# Patient Record
Sex: Male | Born: 1935
Health system: Southern US, Community
[De-identification: ages and names within clinical notes are randomized; demographics above are authoritative.]

## PROBLEM LIST (undated history)

## (undated) DIAGNOSIS — B351 Tinea unguium: Secondary | ICD-10-CM

## (undated) DIAGNOSIS — C801 Malignant (primary) neoplasm, unspecified: Secondary | ICD-10-CM

## (undated) DIAGNOSIS — Z9889 Other specified postprocedural states: Secondary | ICD-10-CM

## (undated) DIAGNOSIS — F419 Anxiety disorder, unspecified: Secondary | ICD-10-CM

## (undated) DIAGNOSIS — N189 Chronic kidney disease, unspecified: Secondary | ICD-10-CM

## (undated) DIAGNOSIS — Z8719 Personal history of other diseases of the digestive system: Secondary | ICD-10-CM

## (undated) DIAGNOSIS — E785 Hyperlipidemia, unspecified: Secondary | ICD-10-CM

## (undated) DIAGNOSIS — R51 Headache: Secondary | ICD-10-CM

## (undated) DIAGNOSIS — R0602 Shortness of breath: Secondary | ICD-10-CM

## (undated) DIAGNOSIS — M2550 Pain in unspecified joint: Secondary | ICD-10-CM

## (undated) DIAGNOSIS — I10 Essential (primary) hypertension: Secondary | ICD-10-CM

## (undated) DIAGNOSIS — N400 Enlarged prostate without lower urinary tract symptoms: Secondary | ICD-10-CM

## (undated) DIAGNOSIS — K5909 Other constipation: Secondary | ICD-10-CM

## (undated) DIAGNOSIS — G4700 Insomnia, unspecified: Secondary | ICD-10-CM

## (undated) DIAGNOSIS — Z8739 Personal history of other diseases of the musculoskeletal system and connective tissue: Secondary | ICD-10-CM

## (undated) DIAGNOSIS — F329 Major depressive disorder, single episode, unspecified: Secondary | ICD-10-CM

## (undated) DIAGNOSIS — I2510 Atherosclerotic heart disease of native coronary artery without angina pectoris: Secondary | ICD-10-CM

## (undated) DIAGNOSIS — R112 Nausea with vomiting, unspecified: Secondary | ICD-10-CM

## (undated) DIAGNOSIS — F32A Depression, unspecified: Secondary | ICD-10-CM

## (undated) DIAGNOSIS — M21619 Bunion of unspecified foot: Secondary | ICD-10-CM

## (undated) DIAGNOSIS — K219 Gastro-esophageal reflux disease without esophagitis: Secondary | ICD-10-CM

## (undated) DIAGNOSIS — R42 Dizziness and giddiness: Secondary | ICD-10-CM

## (undated) HISTORY — PX: EYE SURGERY: SHX253

## (undated) HISTORY — PX: CHOLECYSTECTOMY: SHX55

## (undated) HISTORY — PX: CORONARY ANGIOPLASTY: SHX604

## (undated) HISTORY — DX: Chronic kidney disease, unspecified: N18.9

## (undated) HISTORY — PX: HEMORRHOID SURGERY: SHX153

## (undated) HISTORY — PX: COLONOSCOPY: SHX174

## (undated) HISTORY — PX: KIDNEY STONE SURGERY: SHX686

## (undated) HISTORY — DX: Malignant (primary) neoplasm, unspecified: C80.1

## (undated) HISTORY — DX: Bunion of unspecified foot: M21.619

## (undated) HISTORY — DX: Tinea unguium: B35.1

## (undated) HISTORY — PX: OTHER SURGICAL HISTORY: SHX169

## (undated) HISTORY — PX: HERNIA REPAIR: SHX51

---

## 1997-10-10 ENCOUNTER — Ambulatory Visit (HOSPITAL_COMMUNITY): Admission: RE | Admit: 1997-10-10 | Discharge: 1997-10-10 | Payer: Self-pay | Admitting: General Surgery

## 1998-04-03 ENCOUNTER — Other Ambulatory Visit: Admission: RE | Admit: 1998-04-03 | Discharge: 1998-04-03 | Payer: Self-pay | Admitting: Orthopedic Surgery

## 2001-06-29 ENCOUNTER — Encounter: Admission: RE | Admit: 2001-06-29 | Discharge: 2001-09-27 | Payer: Self-pay | Admitting: *Deleted

## 2007-08-25 ENCOUNTER — Inpatient Hospital Stay (HOSPITAL_COMMUNITY): Admission: AD | Admit: 2007-08-25 | Discharge: 2007-08-26 | Payer: Self-pay | Admitting: Cardiology

## 2007-09-15 ENCOUNTER — Encounter (HOSPITAL_COMMUNITY): Admission: RE | Admit: 2007-09-15 | Discharge: 2007-12-16 | Payer: Self-pay | Admitting: Cardiology

## 2008-02-23 ENCOUNTER — Observation Stay (HOSPITAL_COMMUNITY): Admission: EM | Admit: 2008-02-23 | Discharge: 2008-02-24 | Payer: Self-pay | Admitting: Emergency Medicine

## 2008-07-26 ENCOUNTER — Ambulatory Visit: Payer: Self-pay | Admitting: Cardiology

## 2008-09-12 ENCOUNTER — Encounter: Admission: RE | Admit: 2008-09-12 | Discharge: 2008-09-12 | Payer: Self-pay | Admitting: Family Medicine

## 2010-02-17 ENCOUNTER — Ambulatory Visit (HOSPITAL_COMMUNITY)
Admission: RE | Admit: 2010-02-17 | Discharge: 2010-02-17 | Payer: Self-pay | Source: Home / Self Care | Attending: Psychiatry | Admitting: Psychiatry

## 2010-07-15 NOTE — Discharge Summary (Signed)
[name], [name]            ACCOUNT NO.:  [id number]   MEDICAL RECORD NO.:  [id number]          PATIENT TYPE:  INP   LOCATION:  6525                         FACILITY:  [facility]   PHYSICIAN:  Francisca December, M.D.  DATE OF BIRTH:  1930-03-08   DATE OF ADMISSION:  08/24/2007  DATE OF DISCHARGE:  08/26/2007                               DISCHARGE SUMMARY   DISCHARGE DIAGNOSES:  1. Coronary artery disease status post drug-eluting stent to the left      anterior descending coronary artery, angioplasty to the first      diagonal.  2. Hypertension.  3. Hyperlipidemia.  4. Platinum study participant.  5. Diet-controlled diabetes mellitus.  6. Long-term medication use.   Cody Thomas is a 77 year old male patient, who was admitted to the  [facility] on August 24, 2007, with unstable angina.  His troponin was  elevated to 0.60 consistent with a non-ST-segment elevated myocardial  infarction.  He was taken to the cardiac cath lab on August 24, 2007, and  was found to have the following:  Distal left main 20%, proximal 80% LAD  lesion followed by 30% proximal LAD lesion.  There was a discrete 95%  proximal lesion in the marginal vessel moderate 40%proximal RCA lesion.  At this point, we considered 2-vessel angioplasty versus coronary artery  bypass grafting.   Finally, a decision was made to utilize percutaneous intervention.  The  patient was brought back to the catheterization lab and a drug-eluting  stent was placed to the LAD.  Angioplasty was then using a Platinum  Study stent to the OM vessel.  The patient remained in the hospital  overnight and was ready for discharge to home the following day.   Other lab studies include a sodium of 137, potassium 3.7, BUN 13, and  creatinine 1.35.  White count 6.9, hemoglobin 15.3, hematocrit 43.5,  platelets 135, and hemoglobin A1c 6.7.  Urinalysis negative.  Total  cholesterol 156.  Apparently, the patient had developed a fever of 101.1  during the hospitalization.  Blood cultures were negative.  Please see  chart for further specifics.   The patient was discharged to home on these medications:  1. Cozaar 50 mg twice a day.  2. Enteric-coated aspirin 325 mg a day.  3. Plavix 75 mg a day.  4. Lasix as prior to admission.  5. Fish oil daily.  6. Sublingual nitroglycerin p.r.n. chest pain.   The patient is to remain on low-sodium heart-healthy diet.  Clean cath  site gently with soap and water, no scrubbing.  No lifting over 10  pounds for 1 week.  No driving for 2 days.  Follow up with Dr.  Deitra Mayo nurse practitioner on December 07, 2007, at 09:30 a.m.      Guy Franco, P.A.      Francisca December, M.D.  Electronically Signed    LB/MEDQ  D:  11/09/2007  T:  11/10/2007  Job:  [id number]

## 2010-07-15 NOTE — Discharge Summary (Signed)
NAMEZAVIAR, MOSCO NO.:  [id number]   MEDICAL RECORD NO.:  [id number]          PATIENT TYPE:  INP   LOCATION:  3711                         FACILITY:  [facility]   PHYSICIAN:  Ramiro Harvest, MD    DATE OF BIRTH:  08-07-1930   DATE OF ADMISSION:  02/23/2008  DATE OF DISCHARGE:  02/24/2008                               DISCHARGE SUMMARY   The patient's primary care physician is Dr. Juluis Rainier of [facility].   The patient's cardiologist Dr. Amil Amen of [facility].   DISCHARGE DIAGNOSES:  1. Syncope, multifactorial, likely secondary to micturition syncope      with volume depletion secondary to diuretic use.  2. Orthostasis.  3. Coronary artery disease status post percutaneous coronary      intervention of a drug-eluting stent to the left anterior      descending artery and angioplasty to the first diagonal done in      June 2009.  4. Hyperlipidemia.  5. Hypertension.  6. Diet controlled type 2 diabetes.  7. Status post cholecystectomy.  8. Chronic constipation.  9. Chronic nausea.  10.History of right rotator tear cuff in 1997.  11.Status post bilateral cataract surgery.  12.Status post bilateral inguinal hernia repair.   DISCHARGE MEDICATIONS:  1. Losartan 100 mg p.o. daily to start on Monday, February 27, 2008.  2. Aspirin 325 mg p.o. daily.  3. Plavix 75 mg p.o. daily.  4. Welchol 325 mg 3 tablets p.o. b.i.d.  5. Xanax 0.25 mg p.o. q.h.s. p.r.n.  6. Fish oil 4 tablets p.o. daily.   DISPOSITION AND FOLLOW-UP:  The patient will be discharged home.  The  patient is to follow up with his PCP in 1 week. On follow-up the patient  will need a basic metabolic profile done to follow up on his  electrolytes.  The patient also will need orthostatics checked.  The  patient will need his blood pressure reassessed as the patient's Hyzaar  was discontinued and the patient was just placed on losartan secondary  to concerns of volume depletion  secondary to the diuretic in the Hyzaar.  May consider increasing losartan if needed or adding another medication  to his regimen.  The patient is also to keep follow-up appointment with  his cardiologist, Dr. Amil Amen. If the patient were to have unexplained  episodes of further syncope, may benefit from a loop recorder.   CONSULTATIONS DONE:  None.   PROCEDURES PERFORMED:  1. A chest x-ray was done February 23, 2008 that showed no acute      cardiopulmonary process. Apparent widening of superior mediastinum      is likely related to dextroscoliosis at this level. Bilateral      shallow oblique frontal chest x-rays could be considered for      further evaluation.  CT of the head without contrast was done on      February 23, 2008 that showed age-related atrophy without focal or      acute finding, atherosclerotic calcification of the major vessels      at the base of the grain.   BRIEF ADMISSION HISTORY  AND PHYSICAL:  Mr. Kamarius Meinecke is a 77-year-  old white gentleman with a history of coronary artery disease status  post drug-eluting stent to the LAD and angioplasty to the first  diagonal, history of hypertension, hyperlipidemia, type 2 diabetes diet  controlled, who presented to the ED with a syncopal episode.  The  patient stated when he woke up on the morning of admission at 5:30 a.m.  to go to the bathroom, he started to urinate and once he finished  urinating he felt dizzy, lightheaded and passed out for approximately 15-  20 seconds.  The patient stated that his wife heard him fall and came  and got him.  Associated symptoms include diaphoresis, chronic nausea  which was unchanged, chronic constipation which was unchanged.  The  patient denied any chest pain, no fevers or chills, no emesis, no  palpitations, no abdominal pain, no melena or hematemesis, no  hematochezia.  No focal neurological symptoms.  The patient stated  called his cardiologist's office and was told the  cardiologist was not  in and then referred to his PCP.  The patient went to PCP's office who  checked an EKG which showed a first-degree AV block. She checked some  labs and sent the patient to the ED. In the ED the patient had a  comprehensive metabolic profile done with a glucose of 175. Otherwise  the rest of the plan was within normal limits.  CBC with a white count  of 6.8, hemoglobin 16, platelets 145.  Point of care cardiac markers  were negative.  EKG showed normal sinus rhythm, first-degree AV block,  unchanged from prior EKGs.  The patient stated at that time right after  his syncopal episode he checked his CBG which was 143. His blood  pressure was 120/73. We were called to admit the patient for further  evaluation and management.   PHYSICAL EXAM:  VITAL SIGNS:  Temperature 98.8, blood pressure 138/82,  pulse of 75, respiratory rate 18, satting 98% on room air.  Orthostatics  lying blood pressure 149/81 with a pulse of 73, sitting 142/88 with a  pulse of 87. Standing 133/85 with a pulse of 84.  GENERAL: The patient is in no apparent distress.  HEENT: Normocephalic, atraumatic.  Pupils equal, round and reactive to  light and accommodation.  Extraocular movements intact.  Oropharynx is  clear.  No lesions or exudates.  NECK: Supple.  No lymphadenopathy.  Mildly dry mucous membranes.  RESPIRATORY:  Lungs are clear to auscultation bilaterally.  No wheezes,  no crackles, no rhonchi.  CARDIOVASCULAR: Regular rate and rhythm.  No  murmurs, rubs or gallops.  ABDOMEN: Soft, nontender, nondistended, positive bowel sounds.  EXTREMITIES:  No clubbing, cyanosis or edema.  NEUROLOGICAL:  The patient is alert and oriented x3.  Cranial nerves II-  XII are grossly intact.  No focal deficits.   ADMISSION LABS:  Sodium 139, potassium 4, chloride 105, bicarb 25, BUN  19, creatinine 1.29, glucose of 175, bilirubin 1.8, alk phosphatase 49,  AST 29, ALT 23, protein 7.1, calcium 9.7, albumin 4.2.   Point of care  cardiac markers CK-MB 2.1, troponin-I less than 0.05, myoglobin of 135.  CBC white count 6.8, hemoglobin 16, platelets 145, hematocrit 47.7, ANC  of 5.3.  EKG normal sinus rhythm, first-degree AV block.  CT of the head  as stated above.  Chest x-ray as stated above.   HOSPITAL COURSE:  1. Syncope.  The patient was admitted with syncopal episode. It  was      felt the patient's syncope was likely secondary to a micturition      syncope as well as stasis in the setting of diuretic use.  Cardiac      enzymes were cycled which came back negative x3.  The patient was      placed on the tele floor with uneventful telemetry. Overnight the      patient was asymptomatic.  A TSH was checked which was within      normal limits.  A UDS was checked which was negative. A UA was      obtained which was also negative.  The patient was hydrated with IV      fluids.  The patient's Hyzaar was held. A repeat EKG was done in      the morning which showed a normal sinus rhythm with a first-degree      AV block.  The patient was stable and remained asymptomatic      throughout the hospitalization. The patient will be discharged in      stable and improved condition on losartan.  And the patient is to      start his losartan on Monday, February 27, 2008. The patient's      Hyzaar has been discontinued as this was felt this might have also      a component for his syncope. If the patient is to have any other      unexplained episodes of syncope, he may likely benefit from a loop      recorder. The patient is encouraged to keep his appointment with      his cardiologist, Dr. Amil Amen in mid January. The patient will      follow up with PCP in 1 week on his syncope.  2. Orthostasis.  The patient was found to be orthostatic by pulse.      The patient was hydrated with IV fluids.  The patient's blood      pressure medications were held, and the patient was euvolemic by      day of discharge.  3.  Hypertension.  The patient's blood pressure medications were held.      The patient's blood pressure remained stable throughout the      hospitalization. The patient's Hyzaar has been discontinued.  The      patient will just be continued on losartan 100 mg daily with follow-      up with his PCP for further titration of his blood pressure      medications.   The rest of the patient's chronic medical issues remained stable  throughout the hospitalization and the patient was discharged in stable  and improved condition.   On day of discharge vital signs temperature 98.5, pulse of 72, blood  pressure 114/72, respiratory rate 18, satting 95% on room air.   DISCHARGE LABS:  Sodium 140, potassium 3.6, chloride 107, bicarb 26, BUN  18, creatinine 1.21, glucose of 110, calcium of 8.8.  CBC white count  5.3, hemoglobin 13.8, platelets 129, hematocrit 41.3, total cholesterol  156, triglycerides 155, HDL 21, LDL 104.  TSH of 0.855.   It was a pleasure taking care of Mr. Ammanuel Friebel.      Ramiro Harvest, MD  Electronically Signed     DT/MEDQ  D:  02/24/2008  T:  02/24/2008  Job:  [id number]   cc:   Dossie Der, MD  Francisca December, M.D.

## 2010-07-15 NOTE — H&P (Signed)
NAMEJERVIS, TRAPANI NO.:  [id number]   MEDICAL RECORD NO.:  [id number]          PATIENT TYPE:  EMS   LOCATION:  MAJO                         FACILITY:  [facility]   PHYSICIAN:  Ramiro Harvest, MD    DATE OF BIRTH:  October 30, 1930   DATE OF ADMISSION:  02/23/2008  DATE OF DISCHARGE:                              HISTORY & PHYSICAL   The patient's primary care physician is Dr. Juluis Rainier of [facility].   HISTORY OF PRESENT ILLNESS:  Cody Thomas is a  77 year old white  male with history of coronary artery disease status post drug-eluting  stent to the LAD and angioplasty to the first diagonal, history of  hypertension, hyperlipidemia, diet controlled type 2 diabetes who  presents to the ED with a syncopal episode.  The patient states he woke  up this morning at 5:30 a.m. to go to the bathroom, stated that he  started to urinate and once he finished urinating he felt dizzy and  lightheaded and passed out for approximately 15-20 seconds.  The patient  stated that his wife heard him fall and came in and got him.  Associated  symptoms include diaphoresis, chronic nausea which is unchanged, chronic  constipation which is unchanged.  The patient denies any chest pain, no  fever no chills, no emesis, no palpitations, no abdominal pain, no  melena or hematemesis.  No hematochezia.  No focal neurological  symptoms.  The patient stated they called his cardiologist's office and  was told the cardiologist was not in and referred to his PCP.  The  patient went to his PCPs office who checked an EKG which showed a first-  degree AV block and checked some labs and sent the patient to the ED.  In the ED the patient had a comprehensive metabolic profile with a  glucose of 175, otherwise the rest of the panel was within normal  limits.  CBC with a white count of 6.8, hemoglobin of 16, platelets of  145.  Point of care cardiac markers were negative.  EKG showed a normal  sinus rhythm with first-degree AV block which was unchanged from prior  EKGs.  The patient stated that at the time right after his syncopal  episode, he checked a CBG which was 143 and his blood pressure was  120/73.  We were called to admit the patient for further evaluation and  management.   ALLERGIES:  NO KNOWN DRUG ALLERGIES.  THE PATIENT DOES HAVE AN  INTOLERANCE TO CRESTOR AND ZOCOR AND WHICH CAUSES NIGHTMARES.Marland Kitchen   PAST MEDICAL HISTORY:  1. Coronary artery disease status post drug-eluting stent to the LAD,      angioplasty to the first diagonal June 2009.  2. Hypertension.  3. Hyperlipidemia.  4. Diet-controlled type 2 diabetes.  5. Status post cholecystectomy.  6. Chronic constipation.  7. Chronic nausea.  8. Right rotator cuff tear and 1997.  9. Status post bilateral cataract surgery.  10.Status post bilateral inguinal hernia repair.   HOME MEDICATIONS:  1. Aspirin 325 mg p.o. daily  2. Hyzaar 100/25 p.o. daily.  3. Plavix 75 mg p.o.  daily.  4. Welchol 625 mg 3 tablets p.o. b.i.d.  5. Xanax  0.25 mg p.o. q.h.s. p.r.n.  6. Fish oil 4 tablets p.o. daily   SOCIAL HISTORY:  The patient is married, prior tobacco history, quit in  1986.  No current tobacco use.  No alcohol use.  No IV drug use.  The  patient is a Lexicographer.   FAMILY HISTORY:  Noncontributory.   REVIEW OF SYSTEMS:  As per HPI, otherwise negative.   PHYSICAL EXAMINATION:  Temperature 98.8, blood pressure 138/82, pulse of  75, respiratory rate 18, sating 98% on room air.  Orthostatics laying,  blood pressure 149/81 with a pulse of 73, sitting 142/88 with a pulse of  87, standing 133/85 with a pulse of 84.  GENERAL:  The patient is in no apparent distress.  HEENT: Normocephalic, atraumatic.  Pupils equal, round and reactive to  light and accommodation.  Extraocular movements intact.  Oropharynx is  clear.  No lesions, no exudates.  NECK:  Supple.  No lymphadenopathy.  Mildly dry  mucous membranes.  RESPIRATORY:  Lungs are clear to auscultation bilaterally.  No wheezes,  no crackles.  No rhonchi.  CARDIOVASCULAR:  Regular rate and rhythm.  No murmurs, rubs or gallops.  ABDOMEN:  Soft, nontender, nondistended.  Positive bowel sounds.  EXTREMITIES:  No clubbing, cyanosis or edema.  NEUROLOGICAL:  The patient is alert and oriented x3.  Cranial nerves II-  XII are grossly intact.  No focal deficits.   LABORATORY DATA:  Sodium 139, potassium 4.0, chloride 105, bicarb 25,  BUN 19, creatinine 1.29, glucose 175, bilirubin 1.8, alk phosphatase 49,  AST 29, ALT 23, protein 7.1,  calcium of 9.7, albumin of 4.2, point of  care cardiac markers CK-MB 2.1, troponin I less than 0.05, myoglobin of  135.  CBC white count 6.8, hemoglobin 16.0, platelets of 145, hematocrit  47.7, ANC of 5.3.  EKG showed normal sinus rhythm, first-degree AV  block.  CT of the head shows age related atrophy without focal or acute  findings, atherosclerotic calcification on major vessels at the base of  the brain.  Chest x-ray shows no acute cardiopulmonary disease; apparent  widening of superior mediastinum is likely related to dextroscoliosis at  this level.  Bilateral shallow oblique frontal chest x-ray would be  considered for further evaluation.   ASSESSMENT AND PLAN:  Cody Thomas  is a 77 year old gentleman with  history of coronary artery disease status post drug-eluting stent to the  LAD and angioplasty of the first diagonal, history of hypertension,  hyperlipidemia, diet controlled diabetes presenting to the ED with a  syncopal episode.  1. Syncope likely secondary to a micturition syncope with  orthostasis      in the setting of  diuretic use versus cardiac in nature versus      neurological which is unlikely with no focal neurological findings      in negative head CT.  The patient was orthostatic by pulse and will      cycle cardiac enzymes q.8 h x3.  Check a TSH.  Check a magnesium       level.  Check a urine with cultures and sensitivities.  Check a      UDS.  Will place on telemetry.  Hydrate with IV fluids, hold blood      pressure medications.  Check an EKG in the morning.  Check a 2-D      echo to rule out structural heart  disease such as aortic stenosis.      Will monitor and follow.  2. Orthostasis by pulse.  Hold blood pressure medications for now, IV      fluids.  3. Hypertension, hold blood pressure medications.  4. Coronary artery disease status post PCI stable.  EKG with normal      sinus rhythm with a first-degree AV block.  Will cycle cardiac      enzymes q.8 h x3.  Check a mag, check a TSH.  Check a UA.  Check a      2-D echo to rule out structural heart disease.  Check a fasting      lipid panel.  Place on aspirin, Plavix, Welchol and fish oil and      monitor.  5. Hyperlipidemia.  Check a fasting lipid panel.  Continue home dose      Welchol and fish oil.  6. Type 2 diabetes.  Check a hemoglobin A1c.  Check CBGs  a.c. and      h.s., sliding scale insulin.  7. Prophylaxis.  Protonix for GI prophylaxis.  Lovenox for DVT      prophylaxis.   It has been a pleasure taking care of Mr. Cody Thomas.      Ramiro Harvest, MD  Electronically Signed     DT/MEDQ  D:  02/23/2008  T:  02/23/2008  Job:  [id number]   cc:   Dossie Der, MD  Francisca December, M.D.

## 2010-11-27 LAB — HEMOGLOBIN A1C
Hgb A1c MFr Bld: 6.7 — ABNORMAL HIGH
Mean Plasma Glucose: 161

## 2010-11-27 LAB — BASIC METABOLIC PANEL
BUN: 13
BUN: 14
CO2: 25
CO2: 26
Calcium: 8.9
Calcium: 9.3
Chloride: 104
Chloride: 108
Creatinine, Ser: 1.21
Creatinine, Ser: 1.35
GFR calc Af Amer: >60
GFR calc Af Amer: >60
GFR calc non Af Amer: 52 — ABNORMAL LOW
GFR calc non Af Amer: 59 — ABNORMAL LOW
Glucose, Bld: 139 — ABNORMAL HIGH
Glucose, Bld: 245 — ABNORMAL HIGH
Potassium: 3.7
Potassium: 3.7
Sodium: 139
Sodium: 140

## 2010-11-27 LAB — URINE CULTURE: Colony Count: >=100000

## 2010-11-27 LAB — CBC
HCT: 41.3
HCT: 43.5
Hemoglobin: 14.4
Hemoglobin: 15.3
MCHC: 34.8
MCHC: 35.1
MCV: 94
MCV: 95.3
Platelets: 130 — ABNORMAL LOW
Platelets: 135 — ABNORMAL LOW
RBC: 4.39
RBC: 4.57
RDW: 12.8
RDW: 12.9
WBC: 6.9
WBC: 7.4

## 2010-11-27 LAB — CARDIAC PANEL(CRET KIN+CKTOT+MB+TROPI)
CK, MB: 3.7
CK, MB: 4.2 — ABNORMAL HIGH
CK, MB: 4.4 — ABNORMAL HIGH
Relative Index: 3.7 — ABNORMAL HIGH
Relative Index: 4.2 — ABNORMAL HIGH
Relative Index: INVALID
Total CK: 104
Total CK: 114
Total CK: 94
Troponin I: 0.28 — ABNORMAL HIGH
Troponin I: 0.6
Troponin I: 0.86

## 2010-11-27 LAB — URINALYSIS, ROUTINE W REFLEX MICROSCOPIC
Bilirubin Urine: NEGATIVE
Glucose, UA: NEGATIVE
Ketones, ur: NEGATIVE
Leukocytes, UA: NEGATIVE
Nitrite: NEGATIVE
Protein, ur: NEGATIVE
Specific Gravity, Urine: 1.038 — ABNORMAL HIGH
Urobilinogen, UA: 1
pH: 5.5

## 2010-11-27 LAB — CULTURE, BLOOD (ROUTINE X 2)
Culture: NO GROWTH
Culture: NO GROWTH

## 2010-11-27 LAB — PROTIME-INR
INR: 1.1
Prothrombin Time: 14

## 2010-11-27 LAB — CHOLESTEROL, TOTAL: Cholesterol: 156

## 2010-11-27 LAB — URINE MICROSCOPIC-ADD ON

## 2010-11-27 LAB — APTT: aPTT: 38 — ABNORMAL HIGH

## 2010-12-01 LAB — GLUCOSE, CAPILLARY
Glucose-Capillary: 89
Glucose-Capillary: 102 — ABNORMAL HIGH
Glucose-Capillary: 91
Glucose-Capillary: 92

## 2010-12-03 LAB — GLUCOSE, CAPILLARY: Glucose-Capillary: 115 — ABNORMAL HIGH

## 2010-12-05 LAB — BASIC METABOLIC PANEL
BUN: 18 mg/dL (ref 6–23)
CO2: 26 mEq/L (ref 19–32)
Calcium: 8.8 mg/dL (ref 8.4–10.5)
Chloride: 107 meq/L (ref 96–112)
Creatinine, Ser: 1.21 mg/dL (ref 0.4–1.5)
GFR calc Af Amer: >60 mL/min (ref >60)
GFR calc non Af Amer: 58 mL/min — ABNORMAL LOW (ref >60)
Glucose, Bld: 110 mg/dL — ABNORMAL HIGH (ref 70–99)
Potassium: 3.6 meq/L (ref 3.5–5.1)
Sodium: 140 mEq/L (ref 135–145)

## 2010-12-05 LAB — CARDIAC PANEL(CRET KIN+CKTOT+MB+TROPI)
CK, MB: 2.7 ng/mL (ref 0.3–4.0)
CK, MB: 2.9 ng/mL (ref 0.3–4.0)
Relative Index: 2.8 — ABNORMAL HIGH (ref 0.0–2.5)
Relative Index: INVALID (ref 0.0–2.5)
Total CK: 105 U/L (ref 7–232)
Total CK: 90 U/L (ref 7–232)
Troponin I: 0.01 ng/mL (ref 0.00–0.06)
Troponin I: <0.01 ng/mL (ref 0.00–0.06)

## 2010-12-05 LAB — HEMOGLOBIN A1C
Hgb A1c MFr Bld: 5.7 % (ref 4.6–6.1)
Mean Plasma Glucose: 117 mg/dL

## 2010-12-05 LAB — POCT CARDIAC MARKERS
CKMB, poc: 2.1 ng/mL (ref 1.0–8.0)
Myoglobin, poc: 135 ng/mL (ref 12–200)
Troponin i, poc: <0.05 ng/mL (ref 0.00–0.09)

## 2010-12-05 LAB — DIFFERENTIAL
Basophils Absolute: 0 10*3/uL (ref 0.0–0.1)
Basophils Relative: 0 % (ref 0–1)
Eosinophils Absolute: 0 10*3/uL (ref 0.0–0.7)
Eosinophils Relative: 1 % (ref 0–5)
Lymphocytes Relative: 15 % (ref 12–46)
Lymphs Abs: 1 10*3/uL (ref 0.7–4.0)
Monocytes Absolute: 0.4 10*3/uL (ref 0.1–1.0)
Monocytes Relative: 6 % (ref 3–12)
Neutro Abs: 5.3 10*3/uL (ref 1.7–7.7)
Neutrophils Relative %: 78 % — ABNORMAL HIGH (ref 43–77)

## 2010-12-05 LAB — RAPID URINE DRUG SCREEN, HOSP PERFORMED
Amphetamines: NOT DETECTED
Barbiturates: NOT DETECTED
Benzodiazepines: NOT DETECTED
Cocaine: NOT DETECTED
Opiates: NOT DETECTED
Tetrahydrocannabinol: NOT DETECTED

## 2010-12-05 LAB — MAGNESIUM: Magnesium: 2.1 mg/dL (ref 1.5–2.5)

## 2010-12-05 LAB — COMPREHENSIVE METABOLIC PANEL
ALT: 23 U/L (ref 0–53)
AST: 29 U/L (ref 0–37)
Albumin: 4.2 g/dL (ref 3.5–5.2)
Alkaline Phosphatase: 49 U/L (ref 39–117)
BUN: 19 mg/dL (ref 6–23)
CO2: 25 mEq/L (ref 19–32)
Calcium: 9.7 mg/dL (ref 8.4–10.5)
Chloride: 105 meq/L (ref 96–112)
Creatinine, Ser: 1.29 mg/dL (ref 0.4–1.5)
GFR calc Af Amer: >60 mL/min (ref >60)
GFR calc non Af Amer: 54 mL/min — ABNORMAL LOW (ref >60)
Glucose, Bld: 175 mg/dL — ABNORMAL HIGH (ref 70–99)
Potassium: 4 meq/L (ref 3.5–5.1)
Sodium: 139 mEq/L (ref 135–145)
Total Bilirubin: 1.8 mg/dL — ABNORMAL HIGH (ref 0.3–1.2)
Total Protein: 7.1 g/dL (ref 6.0–8.3)

## 2010-12-05 LAB — GLUCOSE, CAPILLARY
Glucose-Capillary: 126 mg/dL — ABNORMAL HIGH (ref 70–99)
Glucose-Capillary: 158 mg/dL — ABNORMAL HIGH (ref 70–99)
Glucose-Capillary: 172 mg/dL — ABNORMAL HIGH (ref 70–99)

## 2010-12-05 LAB — TROPONIN I: Troponin I: 0.01 ng/mL (ref 0.00–0.06)

## 2010-12-05 LAB — URINALYSIS, ROUTINE W REFLEX MICROSCOPIC
Bilirubin Urine: NEGATIVE
Glucose, UA: NEGATIVE mg/dL
Hgb urine dipstick: NEGATIVE
Ketones, ur: NEGATIVE mg/dL
Nitrite: NEGATIVE
Protein, ur: NEGATIVE mg/dL
Specific Gravity, Urine: 1.017 (ref 1.005–1.030)
Urobilinogen, UA: 0.2 mg/dL (ref 0.0–1.0)
pH: 5.5 (ref 5.0–8.0)

## 2010-12-05 LAB — CBC
HCT: 41.3 % (ref 39.0–52.0)
HCT: 47.7 % (ref 39.0–52.0)
Hemoglobin: 13.8 g/dL (ref 13.0–17.0)
Hemoglobin: 16 g/dL (ref 13.0–17.0)
MCHC: 33.5 g/dL (ref 30.0–36.0)
MCHC: 33.6 g/dL (ref 30.0–36.0)
MCV: 95.5 fL (ref 78.0–100.0)
MCV: 96.5 fL (ref 78.0–100.0)
Platelets: 129 10*3/uL — ABNORMAL LOW (ref 150–400)
Platelets: 145 10*3/uL — ABNORMAL LOW (ref 150–400)
RBC: 4.28 MIL/uL (ref 4.22–5.81)
RBC: 5 MIL/uL (ref 4.22–5.81)
RDW: 12.9 % (ref 11.5–15.5)
RDW: 13.1 % (ref 11.5–15.5)
WBC: 5.3 10*3/uL (ref 4.0–10.5)
WBC: 6.8 10*3/uL (ref 4.0–10.5)

## 2010-12-05 LAB — URINE CULTURE
Colony Count: NO GROWTH
Culture: NO GROWTH

## 2010-12-05 LAB — LIPID PANEL
Cholesterol: 156 mg/dL (ref 0–200)
HDL: 21 mg/dL — ABNORMAL LOW (ref >39)
LDL Cholesterol: 104 mg/dL — ABNORMAL HIGH (ref 0–99)
Total CHOL/HDL Ratio: 7.4 RATIO
Triglycerides: 155 mg/dL — ABNORMAL HIGH (ref <150)
VLDL: 31 mg/dL (ref 0–40)

## 2010-12-05 LAB — CK TOTAL AND CKMB (NOT AT ARMC)
CK, MB: 3.4 ng/mL (ref 0.3–4.0)
Relative Index: 3 — ABNORMAL HIGH (ref 0.0–2.5)
Total CK: 112 U/L (ref 7–232)

## 2010-12-05 LAB — TSH: TSH: 0.855 u[IU]/mL (ref 0.350–4.500)

## 2011-03-03 HISTORY — PX: CARDIAC CATHETERIZATION: SHX172

## 2011-09-29 DIAGNOSIS — M21619 Bunion of unspecified foot: Secondary | ICD-10-CM

## 2011-09-29 HISTORY — DX: Bunion of unspecified foot: M21.619

## 2011-10-04 ENCOUNTER — Encounter (HOSPITAL_COMMUNITY): Payer: Self-pay | Admitting: Emergency Medicine

## 2011-10-04 ENCOUNTER — Emergency Department (HOSPITAL_COMMUNITY)
Admission: EM | Admit: 2011-10-04 | Discharge: 2011-10-04 | Disposition: A | Discharge status: Home / Self Care | Payer: Medicare Other | Attending: Emergency Medicine | Admitting: Emergency Medicine

## 2011-10-04 DIAGNOSIS — Z87891 Personal history of nicotine dependence: Secondary | ICD-10-CM | POA: Insufficient documentation

## 2011-10-04 DIAGNOSIS — S0180XA Unspecified open wound of other part of head, initial encounter: Secondary | ICD-10-CM | POA: Insufficient documentation

## 2011-10-04 DIAGNOSIS — S0181XA Laceration without foreign body of other part of head, initial encounter: Secondary | ICD-10-CM

## 2011-10-04 DIAGNOSIS — E785 Hyperlipidemia, unspecified: Secondary | ICD-10-CM | POA: Insufficient documentation

## 2011-10-04 DIAGNOSIS — IMO0002 Reserved for concepts with insufficient information to code with codable children: Secondary | ICD-10-CM | POA: Insufficient documentation

## 2011-10-04 DIAGNOSIS — I10 Essential (primary) hypertension: Secondary | ICD-10-CM | POA: Insufficient documentation

## 2011-10-04 DIAGNOSIS — Y9301 Activity, walking, marching and hiking: Secondary | ICD-10-CM | POA: Insufficient documentation

## 2011-10-04 DIAGNOSIS — E119 Type 2 diabetes mellitus without complications: Secondary | ICD-10-CM | POA: Insufficient documentation

## 2011-10-04 DIAGNOSIS — Y998 Other external cause status: Secondary | ICD-10-CM | POA: Insufficient documentation

## 2011-10-04 HISTORY — DX: Hyperlipidemia, unspecified: E78.5

## 2011-10-04 HISTORY — DX: Other specified postprocedural states: Z98.890

## 2011-10-04 HISTORY — DX: Essential (primary) hypertension: I10

## 2011-10-04 MED ORDER — BACITRACIN ZINC 500 UNIT/GM EX OINT
TOPICAL_OINTMENT | CUTANEOUS | Status: AC
  Administered 2011-10-04: 1
  Filled 2011-10-04: qty 0.9

## 2011-10-04 NOTE — ED Provider Notes (Signed)
History     CSN: [id number]  Arrival date & time 10/04/11  4098   First MD Initiated Contact with Patient 10/04/11 2121      Chief Complaint  Patient presents with  . Fall    (Consider location/radiation/quality/duration/timing/severity/associated sxs/prior treatment) HPI Patient tripped and fell nearly prior to coming here while walking up steps striking his head creating laceration at 4 head. He denies headache denies pain anywhere no loss of consciousness acting his normal self since the event. No other injury no other associated symptoms. Last tetanus immunization 2011. No treatment prior to coming here Past Medical History  Diagnosis Date  . Hypertension   . Hyperlipidemia   . Diabetes mellitus   . Hx of cardiac cath     Past Surgical History  Procedure Date  . Cholecystectomy     No family history on file.  History  Substance Use Topics  . Smoking status: Former Games developer  . Smokeless tobacco: Not on file  . Alcohol Use: No      Review of Systems  Constitutional: Negative.   HENT: Negative.   Respiratory: Negative.   Cardiovascular: Negative.   Gastrointestinal: Negative.   Musculoskeletal: Negative.   Skin: Positive for wound.  Neurological: Negative.   Hematological: Negative.   Psychiatric/Behavioral: Negative.   All other systems reviewed and are negative.    Allergies  Review of patient's allergies indicates no known allergies.  Home Medications   Current Outpatient Rx  Name Route Sig Dispense Refill  . ASPIRIN 81 MG PO CHEW Oral Chew 81 mg by mouth daily.    Marland Kitchen EZETIMIBE 10 MG PO TABS Oral Take 10 mg by mouth daily.    . OMEGA-3 FATTY ACIDS 1000 MG PO CAPS Oral Take 2 g by mouth 2 (two) times daily.    Marland Kitchen LOSARTAN POTASSIUM-HCTZ 100-25 MG PO TABS Oral Take 1 tablet by mouth daily.    Marland Kitchen ROSUVASTATIN CALCIUM 10 MG PO TABS Oral Take 10 mg by mouth 3 (three) times a week.    Marland Kitchen SITAGLIPTIN PHOSPHATE 100 MG PO TABS Oral Take 100 mg by mouth daily.       BP 144/80  Pulse 86  Temp 98.3 F (36.8 C) (Oral)  Resp 18  SpO2 100%  Physical Exam  Nursing note and vitals reviewed. Constitutional: He appears well-developed and well-nourished.  HENT:       3 cm linear laceration along the vertical axis at right for head. Otherwise normocephalic atraumatic no hematoma. Bilateral tympanic membranes normal  Eyes: Conjunctivae are normal. Pupils are equal, round, and reactive to light.  Neck: Neck supple. No tracheal deviation present. No thyromegaly present.       No tenderness  Cardiovascular: Normal rate and regular rhythm.   No murmur heard. Pulmonary/Chest: Effort normal and breath sounds normal.  Abdominal: Soft. Bowel sounds are normal. He exhibits no distension. There is no tenderness.  Musculoskeletal: Normal range of motion. He exhibits no edema and no tenderness.       Entire spine nontender pelvis stable nontender all 4 extremities without deformity swelling or tenderness, neurovascularly intact  Neurological: He is alert. No cranial nerve deficit. Coordination normal.       Gait normal  Skin: Skin is warm and dry. No rash noted.  Psychiatric: He has a normal mood and affect.    ED Course  Procedures (including critical care time)  Labs Reviewed - No data to display No results found. LACERATION REPAIR Performed by: Doug Sou Authorized by: Doug Sou  Consent: Verbal consent obtained. Risks and benefits: risks, benefits and alternatives were discussed Consent given by: patient Patient identity confirmed: provided demographic data Prepped and Draped in normal sterile fashion Wound explored  Laceration Location: forehead  Laceration Length: 3cm  No Foreign Bodies seen or palpated  Anesthesia: local infiltration  Local anesthetic: lidocaine 2% with epinephrine  Anesthetic total: 1 ml  Irrigation method: syringe Amount of cleaning: standard  Skin closure: 5-0 prolene  Number of sutures:  3  Technique: Simple interrupted  bacitracin ointment with sterile dressing applied Patient tolerance: Patient tolerated the procedure well with no immediate complications.  No diagnosis found.    MDM  Imaging not indicated. No scalp hematoma. No loss of consciousness no headache discussed with patient who agrees. Plan local wound care Tylenol as needed for pain sutures out 5 days blood pressure recheck 3 weeks Diagnoses #1 fall #2 minor closed head trauma #3 forehead laceration #4 hypertension        Doug Sou, MD 10/04/11 2211

## 2011-10-04 NOTE — Discharge Instructions (Signed)
Facial Laceration Sutures should come out in 5 days. Take Tylenol as directed for pain. A facial laceration is a cut on the face. Lacerations usually heal quickly, but they need special care to reduce scarring. It will take 1 to 2 years for the scar to lose its redness and to heal completely. TREATMENT  Some facial lacerations may not require closure. Some lacerations may not be able to be closed due to an increased risk of infection. It is important to see your caregiver as soon as possible after an injury to minimize the risk of infection and to maximize the opportunity for successful closure. If closure is appropriate, pain medicines may be given, if needed. The wound will be cleaned to help prevent infection. Your caregiver will use stitches (sutures), staples, wound glue (adhesive), or skin adhesive strips to repair the laceration. These tools bring the skin edges together to allow for faster healing and a better cosmetic outcome. However, all wounds will heal with a scar.  Once the wound has healed, scarring can be minimized by covering the wound with sunscreen during the day for 1 full year. Use a sunscreen with an SPF of at least 30. Sunscreen helps to reduce the pigment that will form in the scar. When applying sunscreen to a completely healed wound, massage the scar for a few minutes to help reduce the appearance of the scar. Use circular motions with your fingertips, on and around the scar. Do not massage a healing wound. HOME CARE INSTRUCTIONS For sutures:  Keep the wound clean and dry.   If you were given a bandage (dressing), you should change it at least once a day. Also change the dressing if it becomes wet or dirty, or as directed by your caregiver.   Wash the wound with soap and water 2 times a day. Rinse the wound off with water to remove all soap. Pat the wound dry with a clean towel.   After cleaning, apply a thin layer of the antibiotic ointment recommended by your caregiver.  This will help prevent infection and keep the dressing from sticking.   You may shower as usual after the first 24 hours. Do not soak the wound in water until the sutures are removed.   Only take over-the-counter or prescription medicines for pain, discomfort, or fever as directed by your caregiver.   Get your sutures removed as directed by your caregiver. With facial lacerations, sutures should usually be taken out after 4 to 5 days to avoid stitch marks.   Wait a few days after your sutures are removed before applying makeup.  For skin adhesive strips:  Keep the wound clean and dry.   Do not get the skin adhesive strips wet. You may bathe carefully, using caution to keep the wound dry.   If the wound gets wet, pat it dry with a clean towel.   Skin adhesive strips will fall off on their own. You may trim the strips as the wound heals. Do not remove skin adhesive strips that are still stuck to the wound. They will fall off in time.  For wound adhesive:  You may briefly wet your wound in the shower or bath. Do not soak or scrub the wound. Do not swim. Avoid periods of heavy perspiration until the skin adhesive has fallen off on its own. After showering or bathing, gently pat the wound dry with a clean towel.   Do not apply liquid medicine, cream medicine, ointment medicine, or makeup to your  wound while the skin adhesive is in place. This may loosen the film before your wound is healed.   If a dressing is placed over the wound, be careful not to apply tape directly over the skin adhesive. This may cause the adhesive to be pulled off before the wound is healed.   Avoid prolonged exposure to sunlight or tanning lamps while the skin adhesive is in place. Exposure to ultraviolet light in the first year will darken the scar.   The skin adhesive will usually remain in place for 5 to 10 days, then naturally fall off the skin. Do not pick at the adhesive film.  You may need a tetanus shot  if:  You cannot remember when you had your last tetanus shot.   You have never had a tetanus shot.  If you get a tetanus shot, your arm may swell, get red, and feel warm to the touch. This is common and not a problem. If you need a tetanus shot and you choose not to have one, there is a rare chance of getting tetanus. Sickness from tetanus can be serious. SEEK IMMEDIATE MEDICAL CARE IF:  You develop redness, pain, or swelling around the wound.   There is yellowish-white fluid (pus) coming from the wound.   You develop chills or a fever.  MAKE SURE YOU:  Understand these instructions.   Will watch your condition.   Will get help right away if you are not doing well or get worse.  Document Released: 03/26/2004 Document Revised: 02/05/2011 Document Reviewed: 08/11/2010 [facility] Patient Information 2012 [place], [place].

## 2011-10-04 NOTE — ED Notes (Signed)
Pt was going up concrete stairs outside, tripped, and hit steel cabinets on porch.

## 2011-10-04 NOTE — ED Notes (Addendum)
Per pt fell up steps and hit head on metal cabinet-no LOC-not on blood thinners

## 2011-10-15 ENCOUNTER — Encounter (HOSPITAL_COMMUNITY): Payer: Self-pay | Admitting: Pharmacy Technician

## 2011-10-16 ENCOUNTER — Other Ambulatory Visit: Payer: Self-pay | Admitting: Cardiology

## 2011-10-16 NOTE — H&P (Signed)
Procedure Codes: 93000 EKG I AND R, 80048 ECL BMP, 85025 ECL CBC PLATELET DIFF, 36415 BLOOD COLLECTION ROUTINE VENIPUNCTURE       Preventive:         Follow Up: post cath      Provider: Dandre Sisler, MD  Patient: Cody Thomas, Cody Thomas DOB: 04/20/1930 Date: 10/15/2011   HEENT     Patient: Cody Thomas, Cody Thomas Provider: Camry Theiss, MD  DOB: 03/23/1930 Age: 78 Y Sex: Male Date: 10/15/2011  Phone: [phone]   Address: [address], [place], [place]  Pcp: ELIZABETH BARNES       Subjective:     CC:    1. 6MO FOLLOW UP.        HPI:  CAD/ASCVD:  2009 drug eluting stent placement to LAD and circumflex artery. Symptoms prior to stent placement were neck/jaw discomfort. He had some mild episodes that were concerning to him and consistent with prior anginal pain. He nuclear stress test was performed on 09/30/10 which overall was low risk but did show a very subtle apical/lateral perfusion defect. We discussed this at length. Soft tissue attenuation cannot be excluded. Currently he is describing similar symptoms of angina, pain running up his neck, under his arms that he felt prior to his previously placed stents. He is concerned. Hypertension:  Mildly elevated today but previously under good control. Continue to monitor. Compliant with medications. Hyperlipidemia:  Last LDL cholesterol was in the 79 range however he stated that he stopped his Crestor because of bizarre dreams. He will resume. He understands the importance of this.       ROS:  The syncope, no bleeding, no orthopnea, no PND, no dysphasia, all others negative.       Medical History: ASCVD, 2 vessel ( [facility]ds), Status post DES stent, LAD and LCx, 08/25/2007, Presentation for above was progressive unstable typical angina, Dyslipidemia, Diabetes mellitus, diet controlled\, Hypertension, Nuclear stress test 03/2008 with Normal perfusion and normal EF, Anxiety and depression, hypertensive retinal hemorrhage (Dr. 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 Albertini), chronic kidney disease stage III.        Surgical History: cholecystectomy 1990's, cataracts surgery .        Family History: Father: deceased CVA Mother: deceased Brother 1: deceased  leukemia        Social History:  General:  History of smoking  cigarettes: Never smoked no Alcohol.  no Recreational drug use.  Marital Status: married.        Medications: Aspirin 81 MG Tablet Delayed Release 1 tablet Once a day, Fish Oil 1200 mg Capsule 2 capsules twice a day, Crestor 20 MG Tablet 1/2 tab three times per week, Zetia 10 MG Tablet 1 tablet STOPPED, Losartan Potassium-HCTZ 100-25 MG Tablet 1 tablet once a day, Accu-Chek SmartView test strips For use when checking blood sugars as directed, Accu-Chek FastClix lancets For use when checking blood sugars As directed, Januvia 50 MG Tablet 1 tablet Once a day, Medication List reviewed and reconciled with the patient       Allergies: crestor/zocor: nightmares: Side Effects.       Objective:     Vitals: Wt 193, Wt change .4 lb, Ht 64.5, BMI 32.61, Pulse sitting 78, BP sitting 110/70.       Examination:  Cardiology, General:  GENERAL APPEARANCE: pleasant, NAD.  HEENT: unremarkable.  CAROTID UPSTROKE: normal, no bruit.  JVD: flat.  HEART SOUNDS: regular, normal S1, S2, no S3 or S4.  MURMUR: absent.  LUNGS: no rales or wheezes.  ABDOMEN: soft, non tender, positive bowel sounds, no masses felt.  EXTREMITIES: no leg edema.  PERIPHERAL PULSES: 2 plus bilateral.        Assessment:     Assessment:  1. Angina decubitus - 413.0 (Primary)  2. Coronary atherosclerosis of native coronary artery - 414.01      Patient: Cody Thomas, Cody Thomas Provider: Marchel Foote, MD  DOB: 03/23/1930 Age: 78 Y Sex: Male Date: 10/15/2011  Phone: [phone]   Address: [address], [place], [place]  Pcp: ELIZABETH BARNES       Subjective:     CC:    1. 6MO FOLLOW UP.        HPI:  CAD/ASCVD:  2009 drug eluting stent placement to LAD and circumflex artery. Symptoms prior to stent placement were neck/jaw discomfort. He had some mild episodes that were concerning to him and consistent with prior anginal pain. He nuclear stress test was performed on 09/30/10 which overall was low risk but did show a very subtle apical/lateral perfusion defect. We discussed this at length. Soft tissue attenuation cannot be excluded. Currently he is describing similar symptoms of angina, pain running up his neck, under his arms that he felt prior to his previously placed stents. He is concerned. Hypertension:  Mildly elevated today but previously under good control. Continue to monitor. Compliant with medications. Hyperlipidemia:  Last LDL cholesterol was in the 79 range however he stated that he stopped his Crestor because of bizarre dreams. He will resume. He understands the importance of this.       ROS:  The syncope, no bleeding, no orthopnea, no PND, no dysphasia, all others negative.       Medical History: ASCVD, 2 vessel ( [facility]), Status post DES stent, LAD and LCx, 08/25/2007, Presentation for above was progressive unstable typical angina, Dyslipidemia, Diabetes mellitus, diet controlled\, Hypertension, Nuclear stress test 03/2008 with Normal perfusion and normal EF, Anxiety and depression, hypertensive retinal hemorrhage (Dr. 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 Albertini), chronic kidney disease stage III.        Surgical History: cholecystectomy 1990's, cataracts surgery .        Family History: Father: deceased CVA Mother: deceased Brother 1: deceased  leukemia        Social History:  General:  History of smoking  cigarettes: Never smoked no Alcohol.  no Recreational drug use.  Marital Status: married.        Medications: Aspirin 81 MG Tablet Delayed Release 1 tablet Once a day, Fish Oil 1200 mg Capsule 2 capsules twice a day, Crestor 20 MG Tablet 1/2 tab three times per week, Zetia 10 MG Tablet 1 tablet STOPPED, Losartan Potassium-HCTZ 100-25 MG Tablet 1 tablet once a day, Accu-Chek SmartView test strips For use when checking blood sugars as directed, Accu-Chek FastClix lancets For use when checking blood sugars As directed, Januvia 50 MG Tablet 1 tablet Once a day, Medication List reviewed and reconciled with the patient       Allergies: crestor/zocor: nightmares: Side Effects.       Objective:     Vitals: Wt 193, Wt change .4 lb, Ht 64.5, BMI 32.61, Pulse sitting 78, BP sitting 110/70.       Examination:  Cardiology, General:  GENERAL APPEARANCE: pleasant, NAD.  HEENT: unremarkable.  CAROTID UPSTROKE: normal, no bruit.  JVD: flat.  HEART SOUNDS: regular, normal S1, S2, no S3 or S4.  MURMUR: absent.  LUNGS: no rales or wheezes.  ABDOMEN: soft, non tender, positive bowel sounds, no masses felt.  EXTREMITIES: no leg edema.  PERIPHERAL PULSES: 2 plus bilateral.        Assessment:     Assessment:  1. Angina decubitus - 413.0 (Primary)  2. Coronary atherosclerosis of native coronary artery - 414.01  

## 2011-10-19 DIAGNOSIS — Z8042 Family history of malignant neoplasm of prostate: Secondary | ICD-10-CM | POA: Insufficient documentation

## 2011-10-21 ENCOUNTER — Encounter (HOSPITAL_COMMUNITY): Payer: Self-pay | Admitting: Cardiology

## 2011-10-21 ENCOUNTER — Encounter (HOSPITAL_COMMUNITY)
Admission: RE | Disposition: A | Discharge status: Home / Self Care | Payer: Self-pay | Source: Ambulatory Visit | Attending: Cardiology

## 2011-10-21 ENCOUNTER — Ambulatory Visit (HOSPITAL_COMMUNITY)
Admission: RE | Admit: 2011-10-21 | Discharge: 2011-10-21 | Disposition: A | Discharge status: Home / Self Care | Payer: Medicare Other | Source: Ambulatory Visit | Attending: Cardiology | Admitting: Cardiology

## 2011-10-21 DIAGNOSIS — E669 Obesity, unspecified: Secondary | ICD-10-CM | POA: Insufficient documentation

## 2011-10-21 DIAGNOSIS — E785 Hyperlipidemia, unspecified: Secondary | ICD-10-CM | POA: Insufficient documentation

## 2011-10-21 DIAGNOSIS — I2510 Atherosclerotic heart disease of native coronary artery without angina pectoris: Secondary | ICD-10-CM | POA: Diagnosis present

## 2011-10-21 DIAGNOSIS — Z9889 Other specified postprocedural states: Secondary | ICD-10-CM | POA: Insufficient documentation

## 2011-10-21 DIAGNOSIS — I209 Angina pectoris, unspecified: Secondary | ICD-10-CM | POA: Insufficient documentation

## 2011-10-21 DIAGNOSIS — I10 Essential (primary) hypertension: Secondary | ICD-10-CM | POA: Insufficient documentation

## 2011-10-21 DIAGNOSIS — R079 Chest pain, unspecified: Secondary | ICD-10-CM | POA: Diagnosis present

## 2011-10-21 DIAGNOSIS — Z9861 Coronary angioplasty status: Secondary | ICD-10-CM | POA: Insufficient documentation

## 2011-10-21 DIAGNOSIS — E119 Type 2 diabetes mellitus without complications: Secondary | ICD-10-CM | POA: Insufficient documentation

## 2011-10-21 HISTORY — PX: LEFT HEART CATHETERIZATION WITH CORONARY ANGIOGRAM: SHX5451

## 2011-10-21 HISTORY — DX: Atherosclerotic heart disease of native coronary artery without angina pectoris: I25.10

## 2011-10-21 LAB — BASIC METABOLIC PANEL
BUN: 19 mg/dL (ref 6–23)
CO2: 28 meq/L (ref 19–32)
Calcium: 9.7 mg/dL (ref 8.4–10.5)
Chloride: 103 mEq/L (ref 96–112)
Creatinine, Ser: 1.23 mg/dL (ref 0.50–1.35)
GFR calc Af Amer: 63 mL/min — ABNORMAL LOW (ref >90)
GFR calc non Af Amer: 54 mL/min — ABNORMAL LOW (ref >90)
Glucose, Bld: 161 mg/dL — ABNORMAL HIGH (ref 70–99)
Potassium: 3.8 mEq/L (ref 3.5–5.1)
Sodium: 140 meq/L (ref 135–145)

## 2011-10-21 LAB — GLUCOSE, CAPILLARY
Glucose-Capillary: 163 mg/dL — ABNORMAL HIGH (ref 70–99)
Glucose-Capillary: 124 mg/dL — ABNORMAL HIGH (ref 70–99)

## 2011-10-21 SURGERY — LEFT HEART CATHETERIZATION WITH CORONARY ANGIOGRAM
Anesthesia: LOCAL

## 2011-10-21 MED ORDER — LIDOCAINE HCL (PF) 1 % IJ SOLN
INTRAMUSCULAR | Status: AC
  Filled 2011-10-21: qty 30

## 2011-10-21 MED ORDER — FENTANYL CITRATE 0.05 MG/ML IJ SOLN
INTRAMUSCULAR | Status: AC
  Filled 2011-10-21: qty 2

## 2011-10-21 MED ORDER — ASPIRIN 81 MG PO CHEW
324.0000 mg | CHEWABLE_TABLET | ORAL | Status: AC
  Administered 2011-10-21: 324 mg via ORAL
  Filled 2011-10-21: qty 4

## 2011-10-21 MED ORDER — HEPARIN (PORCINE) IN NACL 2-0.9 UNIT/ML-% IJ SOLN
INTRAMUSCULAR | Status: AC
  Filled 2011-10-21: qty 2000

## 2011-10-21 MED ORDER — DIAZEPAM 5 MG PO TABS
5.0000 mg | ORAL_TABLET | ORAL | Status: AC
  Administered 2011-10-21: 5 mg via ORAL
  Filled 2011-10-21: qty 1

## 2011-10-21 MED ORDER — MIDAZOLAM HCL 2 MG/2ML IJ SOLN
INTRAMUSCULAR | Status: AC
  Filled 2011-10-21: qty 2

## 2011-10-21 MED ORDER — SODIUM CHLORIDE 0.9 % IV SOLN: 1.0000 mL/kg/h | INTRAVENOUS | Status: AC

## 2011-10-21 MED ORDER — HEPARIN SODIUM (PORCINE) 1000 UNIT/ML IJ SOLN
INTRAMUSCULAR | Status: AC
  Filled 2011-10-21: qty 1

## 2011-10-21 MED ORDER — ACETAMINOPHEN 325 MG PO TABS: 650.0000 mg | ORAL_TABLET | ORAL | Status: DC | PRN

## 2011-10-21 MED ORDER — SODIUM CHLORIDE 0.9 % IJ SOLN: 3.0000 mL | INTRAMUSCULAR | Status: DC | PRN

## 2011-10-21 MED ORDER — SODIUM CHLORIDE 0.9 % IV SOLN
INTRAVENOUS | Status: DC
  Administered 2011-10-21: 09:00:00 via INTRAVENOUS

## 2011-10-21 MED ORDER — NITROGLYCERIN 0.2 MG/ML ON CALL CATH LAB
INTRAVENOUS | Status: AC
  Filled 2011-10-21: qty 1

## 2011-10-21 MED ORDER — SODIUM CHLORIDE 0.9 % IJ SOLN: 3.0000 mL | Freq: Two times a day (BID) | INTRAMUSCULAR | Status: DC

## 2011-10-21 MED ORDER — ONDANSETRON HCL 4 MG/2ML IJ SOLN: 4.0000 mg | Freq: Four times a day (QID) | INTRAMUSCULAR | Status: DC | PRN

## 2011-10-21 MED ORDER — VERAPAMIL HCL 2.5 MG/ML IV SOLN
INTRAVENOUS | Status: AC
  Filled 2011-10-21: qty 2

## 2011-10-21 MED ORDER — SODIUM CHLORIDE 0.9 % IV SOLN: 250.0000 mL | INTRAVENOUS | Status: DC | PRN

## 2011-10-21 NOTE — CV Procedure (Signed)
CARDIAC CATHETERIZATION  PROCEDURE:  Left heart catheterization with selective coronary angiography, left ventriculogram via the radial artery approach.  INDICATIONS:  78 year old male with diabetes, hyperlipidemia, hypertension, obesity with prior LAD and circumflex drug-eluting stent in 2009 has been experiencing increasing neck/jaw discomfort concerning to him as anginal pain.  The risks, benefits, and details of the procedure were explained to the patient, including possibilities of stroke, heart attack, death, renal impairment, arterial damage, bleeding.  The patient verbalized understanding and wanted to proceed.  Informed written consent was obtained.  PROCEDURE TECHNIQUE:  Allen's test was performed pre-and post procedure and was normal. The right radial artery site was prepped and draped in a sterile fashion. One percent lidocaine was used for local anesthesia. Using the modified Seldinger technique a 5 French hydrophilic sheath was inserted into the radial artery without difficulty. 3 mg of verapamil was administered via the sheath. A Judkins right #4 catheter with the guidance of a Versicore wire was placed in the right coronary cusp. He had tortuosity within the subclavian artery which made it difficult to torque catheter. A no torque catheter was used, ERAD, AL1, 6 French sheath exchange then a.l. 0.75, then finally an  selectively cannulated the right coronary artery. This was extremely difficult to cannulate. Extensive fluoroscopy time was utilized. Dr. Eldridge Dace assisted and was able to successfully cannulate. After traversing the aortic arch, 4500 units of heparin IV was administered. A Judkins left #3.5 catheter was used to selectively cannulate the left main artery. Multiple views with hand injection of Omnipaque were obtained. Catheter a pigtail catheter was used to cross into the left ventricle, hemodynamics were obtained, and a left ventriculogram was performed in the RAO position with  power injection. Following the procedure, sheath was removed, patient was hemodynamically stable, hemostasis was maintained with a Terumo T band.   CONTRAST:  Total of 150 ml.    FLOUROSCOPY TIME: 29.5 min.  COMPLICATIONS:  None.    HEMODYNAMICS:  Aortic pressure was 124/23mmHg; LV systolic pressure was ; LVEDP .  There was no gradient between the left ventricle and aorta.    ANGIOGRAPHIC DATA:    Left main: Left main artery is mildly calcified, branches into the LAD and circumflex. No angiographically significant disease.  Left anterior descending (LAD): Proximal LAD has a 90% stenosis, calcified proximal to the previously placed stent. The remainder of the LAD is widely patent with only minor luminal irregularities. There is a moderate size first diagonal branch with 90% ostial stenosis as well. Previously placed stents are patent.  Circumflex artery (CIRC): In the first obtuse marginal branch, the previously placed stent is widely patent however just distal to the stent the artery tapers to 70-80% stenosis. The remainder of the circumflex artery is small in caliber with no significant disease.  Right coronary artery (RCA): Proximal RCA demonstrated 99% stenosis, there is also a 70% mid stenosis. The distal right coronary artery feeds into the posterior descending artery which is patent. Overall TIMI 2 flow.  LEFT VENTRICULOGRAM:  Left ventricular angiogram was done in the 30 RAO projection and revealed normal left ventricular wall motion and systolic function with an estimated ejection fraction of 55%.   IMPRESSIONS:  Significant three-vessel coronary artery disease, progression from prior catheterization in 2009. Proximal LAD 90%, proximal RCA 99%, first obtuse marginal 70% just following stent placement. Previously placed stents are widely patent. Normal left ventricular systolic function.  LVEDP 12 mmHg.  Ejection fraction 55%.  RECOMMENDATION:  Given the progression of  disease, 3 vessels including proximal LAD, I will refer to cardiothoracic surgery for bypass. I discussed with Dr. Eldridge Dace.  Given the slow progression of his symptoms, I will discharge him and have him followup as an outpatient with surgery. If symptoms worsen or become more worrisome, he knows to report to the emergency room immediately.

## 2011-10-21 NOTE — H&P (View-Only) (Signed)
Patient: Cody Thomas, Broz Provider: Donato Schultz, MD  DOB: 09/25/1930 Age: 78 Y Sex: Male Date: 10/15/2011  Phone: [phone]   Address: [address], [place], [place]  Pcp: Juluis Rainier       Subjective:     CC:    1. FOLLOW UP.        HPI:  CAD/ASCVD:  2009 drug eluting stent placement to LAD and circumflex artery. Symptoms prior to stent placement were neck/jaw discomfort. He had some mild episodes that were concerning to him and consistent with prior anginal pain. He nuclear stress test was performed on 09/30/10 which overall was low risk but did show a very subtle apical/lateral perfusion defect. We discussed this at length. Soft tissue attenuation cannot be excluded. Currently he is describing similar symptoms of angina, pain running up his neck, under his arms that he felt prior to his previously placed stents. He is concerned. Hypertension:  Mildly elevated today but previously under good control. Continue to monitor. Compliant with medications. Hyperlipidemia:  Last LDL cholesterol was in the 79 range however he stated that he stopped his Crestor because of bizarre dreams. He will resume. He understands the importance of this.       ROS:  The syncope, no bleeding, no orthopnea, no PND, no dysphasia, all others negative.       Medical History: ASCVD, 2 vessel Amil Amen), Status post DES stent, LAD and LCx, 08/25/2007, Presentation for above was progressive unstable typical angina, Dyslipidemia, Diabetes mellitus, diet controlled\, Hypertension, Nuclear stress test 03/2008 with Normal perfusion and normal EF, Anxiety and depression, hypertensive retinal hemorrhage (Dr. 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 Park Liter), chronic kidney disease stage III.        Surgical History: cholecystectomy 1990's, cataracts surgery .        Family History: Father: deceased CVA Mother: deceased Brother 1: deceased  leukemia        Social History:  General:  History of smoking  cigarettes: Never smoked no Alcohol.  no Recreational drug use.  Marital Status: married.        Medications: Aspirin 81 MG Tablet Delayed Release 1 tablet Once a day, Fish Oil 1200 mg Capsule 2 capsules twice a day, Crestor 20 MG Tablet 1/2 tab three times per week, Zetia 10 MG Tablet 1 tablet STOPPED, Losartan Potassium-HCTZ 100-25 MG Tablet 1 tablet once a day, Accu-Chek SmartView test strips For use when checking blood sugars as directed, Accu-Chek FastClix lancets For use when checking blood sugars As directed, Januvia 50 MG Tablet 1 tablet Once a day, Medication List reviewed and reconciled with the patient       Allergies: crestor/zocor: nightmares: Side Effects.       Objective:     Vitals: Wt 193, Wt change .4 lb, Ht 64.5, BMI 32.61, Pulse sitting 78, BP sitting 110/70.       Examination:  Cardiology, General:  GENERAL APPEARANCE: pleasant, NAD.  HEENT: unremarkable.  CAROTID UPSTROKE: normal, no bruit.  JVD: flat.  HEART SOUNDS: regular, normal S1, S2, no S3 or S4.  MURMUR: absent.  LUNGS: no rales or wheezes.  ABDOMEN: soft, non tender, positive bowel sounds, no masses felt.  EXTREMITIES: no leg edema.  PERIPHERAL PULSES: 2 plus bilateral.        Assessment:     Assessment:  1. Angina decubitus - 413.0 (Primary)  2. Coronary atherosclerosis of native coronary artery - 414.01  3. Essential hypertension, benign - 401.1  4. Obesity, unspecified - 278.00  5. Hypercholesteremia, pure - 272.0    Plan:     1. Angina decubitus  LAB: Basic Metabolic creatinine 1.55    GLUCOSE 143 70-99 - mg/dL H   BUN 21 1-61 - mg/dL    CREATININE 0.96 0.45-4.09 - mg/dl H   eGFR (NON-AFRICAN AMERICAN) 43 >60 - calc L   eGFR (AFRICAN AMERICAN) 53 >60 - calc L   SODIUM 138 136-145 - mmol/L    POTASSIUM 4.0 3.5-5.5 - mmol/L    CHLORIDE 103 98-107 - mmol/L    C02 30 22-32 - mg/dL    ANION GAP 81.1 9.1-47.8 -  mmol/L    CALCIUM 9.8 8.6-10.3 - mg/dL     Kionte Baumgardner 29/56/2130 10:49:31 AM > creatinine elevated 1.55, tell him to hold losartan/HCTZ prior to cardiac catheterization starting today Harward,Amy 10/16/2011 11:52:44 AM > Pt notified. Meds updated.    LAB: CBC with Diff hemoglobin 15.4    WBC 6.6 4.0-11.0 - K/ul    RBC 4.76 4.20-5.80 - M/uL    HGB 15.4 13.0-17.0 - g/dL    HCT 86.5 78.4-69.6 - %    MCH 32.3 27.0-33.0 - pg    MPV 8.6 7.5-10.7 - fL    MCV 94.2 80.0-94.0 - fL H   MCHC 34.3 32.0-36.0 - g/dL    RDW 29.5 28.4-13.2 - %    NRBC# 0.02 -    PLT 156 150-400 - K/uL    NEUT % 63.2 43.3-71.9 - %    NRBC% 0.30 - %    LYMPH% 24.2 16.8-43.5 - %    MONO % 9.1 4.6-12.4 - %    EOS % 2.8 0.0-7.8 - %    BASO % 0.7 0.0-1.0 - %    NEUT # 4.2 1.9-7.2 - K/uL    LYMPH# 1.60 1.10-2.70 - K/uL    MONO # 0.6 0.3-0.8 - K/uL    EOS # 0.2 0.0-0.6 - K/uL    BASO # 0.0 0.0-0.1 - K/uL     Niyonna Betsill 10/16/2011 10:50:53 AM > Harward,Amy 10/16/2011 11:53:29 AM > pt notified.    LAB: PT (Prothrombin Time) (440102) normal    Prothrombin Time 11.7 9.1-12.0 - SEC    INR 1.1 0.8-1.2 -     Yotam Rhine 10/16/2011 10:50:40 AM > Harward,Amy 10/16/2011 11:53:40 AM > pt notified.    Given his symptoms of recurrent angina, I would like to pursue cardiac catheterization to ensure that previously placed LAD and obtuse marginal stents are patent and to make sure that there is no area of worsening disease. Risks and benefits of cardiac catheterization have been reviewed including risk of stroke, heart attack, death, bleeding, renal impariment and arterial damage. There was ample oppurtuny to answer questions. Alternatives were discussed. Patient understands and wishes to proceed.       2. Coronary atherosclerosis of native coronary artery  Diagnostic Imaging:EKG sinus rhythm, 78, first degree heart block, Plummer,Wanda 10/15/2011 02:47:35 PM > Jaythen Hamme 10/15/2011 03:08:16 PM > prior EKG demonstrated second-degree  heart block type I. This is not seen on this EKG.  LAD/OM stent.       3. Essential hypertension, benign  Currently well controlled.       4. Obesity, unspecified  Weight loss is very important. Decrease carbohydrates.       5. Hypercholesteremia, pure  Continue with current antihyperlipidemia regimen.        Immunizations:        Labs:  Procedure Codes: 16109 EKG I AND R, 80048 ECL BMP, 85025 ECL CBC PLATELET DIFF, 60454 BLOOD COLLECTION ROUTINE VENIPUNCTURE       Preventive:         Follow Up: post cath      Provider: Donato Schultz, MD  Patient: Ilan, Kahrs DOB: [birth year] Date: 10/15/2011   HEENT

## 2011-10-21 NOTE — Interval H&P Note (Signed)
History and Physical Interval Note:  10/21/2011 10:52 AM  Cody Thomas  has presented today for surgery, with the diagnosis of angina  The various methods of treatment have been discussed with the patient and family. After consideration of risks, benefits and other options for treatment, the patient has consented to  Procedure(s) (LRB): LEFT HEART CATHETERIZATION WITH CORONARY ANGIOGRAM (N/A) as a surgical intervention .  The patient's history has been reviewed, patient examined, no change in status, stable for surgery.  I have reviewed the patient's chart and labs.  Questions were answered to the patient's satisfaction.     [name]

## 2011-10-21 NOTE — Discharge Instructions (Signed)
Radial Site Care Refer to this sheet in the next few weeks. These instructions provide you with information on caring for yourself after your procedure. Your caregiver may also give you more specific instructions. Your treatment has been planned according to current medical practices, but problems sometimes occur. Call your caregiver if you have any problems or questions after your procedure. HOME CARE INSTRUCTIONS  You may shower the day after the procedure.Remove the bandage (dressing) and gently wash the site with plain soap and water.Gently pat the site dry.   Do not apply powder or lotion to the site.   Do not submerge the affected site in water for 3 to 5 days.   Inspect the site at least twice daily.   Do not flex or bend the affected arm for 24 hours.   No lifting over 5 pounds (2.3 kg) for 5 days after your procedure.   Do not drive home if you are discharged the same day of the procedure. Have someone else drive you.   You may drive 24 hours after the procedure unless otherwise instructed by your caregiver.   Do not operate machinery or power tools for 24 hours.   A responsible adult should be with you for the first 24 hours after you arrive home.  What to expect:  Any bruising will usually fade within 1 to 2 weeks.   Blood that collects in the tissue (hematoma) may be painful to the touch. It should usually decrease in size and tenderness within 1 to 2 weeks.  SEEK IMMEDIATE MEDICAL CARE IF:  You have unusual pain at the radial site.   You have redness, warmth, swelling, or pain at the radial site.   You have drainage (other than a small amount of blood on the dressing).   You have chills.   You have a fever or persistent symptoms for more than 72 hours.   You have a fever and your symptoms suddenly get worse.   Your arm becomes pale, cool, tingly, or numb.   You have heavy bleeding from the site. Hold pressure on the site.  Document Released: 03/21/2010  Document Revised: 02/05/2011 Document Reviewed: 03/21/2010 Exit[employer] Patient Information 2012 [employer]. 

## 2011-10-22 ENCOUNTER — Encounter: Payer: Self-pay | Admitting: *Deleted

## 2011-10-22 ENCOUNTER — Institutional Professional Consult (permissible substitution) (INDEPENDENT_AMBULATORY_CARE_PROVIDER_SITE_OTHER): Payer: Medicare Other | Admitting: Cardiothoracic Surgery

## 2011-10-22 ENCOUNTER — Encounter: Payer: Self-pay | Admitting: Cardiothoracic Surgery

## 2011-10-22 VITALS — BP 174/104 | HR 76 | Resp 16 | Ht 67.0 in | Wt 193.0 lb

## 2011-10-22 DIAGNOSIS — N189 Chronic kidney disease, unspecified: Secondary | ICD-10-CM

## 2011-10-22 DIAGNOSIS — E119 Type 2 diabetes mellitus without complications: Secondary | ICD-10-CM

## 2011-10-22 DIAGNOSIS — I2089 Other forms of angina pectoris: Secondary | ICD-10-CM

## 2011-10-22 DIAGNOSIS — I208 Other forms of angina pectoris: Secondary | ICD-10-CM

## 2011-10-22 DIAGNOSIS — I2510 Atherosclerotic heart disease of native coronary artery without angina pectoris: Secondary | ICD-10-CM

## 2011-10-22 NOTE — Patient Instructions (Signed)
Stop fish oil now Resume losartan in 2 days Your heart bypass surgery is scheduled for August 29 at [facility] hospital

## 2011-10-22 NOTE — Progress Notes (Signed)
PCP is Gaye Alken, MD Referring Provider is Donato Schultz, MD                      [address] [place]            [place] [address]          [phone]      Chief Complaint  Patient presents with  . Coronary Artery Disease    s/p nuclear stress test, cardiac cath 10/21/11  . Chest Pain    HPI: The patient is a 78 year old obese male nonsmoker diabetic with class III exertional angina. His symptoms include upper chest tightness radiating to the neck jaw and left arm. Symptoms usually resolve with rest. He denies nocturnal symptoms. He denies symptoms of heart failure. The patient had 2 stents placed in 2009 by Dr. Ophelia Shoulder and circumflex. Recent cardiac catheterization performed by Dr. Anne Fu followed a positive stress test which showed recurrent high grade 3 vessel CAD with preserved LV systolic function. A 2-D echo is pending. Surgical coronary revascularization was recommended by his cardiologist rather than repeat PCI. Catheterization yesterday was performed via right radial artery without complication. The patient has a creatinine of 1.55 and was told to hold his losartan for 48 hours post cath. The patient has not been on Plavix for several weeks.   Past Medical History  Diagnosis Date  . Hypertension   . Hyperlipidemia   . Hx of cardiac cath   . Coronary atherosclerosis of native coronary artery     2009 LAD CIRC DES  . Diabetes mellitus     diet controlled  . Cancer     nose/Dr. Park Liter  . Chronic kidney disease     stage 111    Past Surgical History  Procedure Date  . Cholecystectomy   . Eye surgery     bilateral cataracts    Family History  Problem Relation Age of Onset  . Stroke Father   . Cancer Brother     Social History History  Substance Use Topics  . Smoking status: Never Smoker   . Smokeless tobacco: Never Used  . Alcohol Use: No    Current Outpatient Prescriptions  Medication Sig Dispense Refill  . aspirin 81 MG  chewable tablet Chew 81 mg by mouth daily.      . calcium carbonate (TUMS - DOSED IN MG ELEMENTAL CALCIUM) 500 MG chewable tablet Chew 2 tablets by mouth daily as needed. For indigestion.       . fish oil-omega-3 fatty acids 1000 MG capsule Take 1,200 mg by mouth 2 (two) times daily.       . rosuvastatin (CRESTOR) 10 MG tablet Take 10 mg by mouth 3 (three) times a week.      . sitaGLIPtin (JANUVIA) 50 MG tablet Take 50 mg by mouth daily.      Marland Kitchen losartan-hydrochlorothiazide (HYZAAR) 100-25 MG per tablet Take 1 tablet by mouth daily. Has not taken in the last three days per Dr. Anne Fu due to kidney function       No current facility-administered medications for this visit.   Facility-Administered Medications Ordered in Other Visits  Medication Dose Route Frequency Provider Last Rate Last Dose  . DISCONTD: 0.9 %  sodium chloride infusion   Intravenous Continuous Donato Schultz, MD 125 mL/hr at 10/21/11 [phone]    . DISCONTD: 0.9 %  sodium chloride infusion  250 mL Intravenous PRN Donato Schultz, MD      . DISCONTD: acetaminophen (  TYLENOL) tablet 650 mg  650 mg Oral Q4H PRN Donato Schultz, MD      . DISCONTD: ondansetron (ZOFRAN) injection 4 mg  4 mg Intravenous Q6H PRN Donato Schultz, MD      . DISCONTD: sodium chloride 0.9 % injection 3 mL  3 mL Intravenous Q12H Donato Schultz, MD      . DISCONTD: sodium chloride 0.9 % injection 3 mL  3 mL Intravenous PRN Donato Schultz, MD        Allergies  Allergen Reactions  . Crestor (Rosuvastatin)     Nightmares in high dosage  . Zocor (Simvastatin)     Nightmares in high dosage    Review of Systems no problems with anesthesia or bleeding following his right shoulder surgery and gallbladder surgery No weight loss or fever No recent respiratory symptoms or bronchitis No history of thoracic trauma or history of abnormal chest x-ray No history of DVT, varicose veins, claudication No history of TIA or stroke carotid Doppler studies pending Patient is right-hand  dominant   BP 174/104  Pulse 76  Resp 16  Ht 5\' 7"  (1.702 m)  Wt 193 lb (87.544 kg)  BMI 30.23 kg/m2  SpO2 95% Physical Exam Gen. alert and comfortable no acute distress HEENT normocephalic pupils equal dentition good Neck no JVD mass  Lymphatics no palpable nodes in the neck Thorax no deformity distant breath sounds no wheezing  Cardiac regular rhythm no murmur or S3 heart sound Abdomen obese soft nontender Extremities no edema tenderness cyanosis Vascular extremities warm well perfused palpable pulses right radial artery without hematoma following cardiac cath no air close veins Neurologic no focal motor deficit normal gait   Diagnostic Tests: Cardiac catheterization reviewed with patient and wife. He is preserved LV systolic function, high-grade stenosis of the LAD after the patent stent, high-grade stenosis of the circumflex after the patent stent in high-grade proximal RCA stenosis. He has graftable vessels and despite age 48 appears to be an acceptable candidate for surgical coronary revascularization  Impression: Severe three-vessel coronary disease with accelerating angina plan CABG  Plan: CABG scheduled at [facility] hospital August 29. Patient will stop his fish oil now, resume his losartan preop and complete preoperative assessment on August 27

## 2011-10-23 ENCOUNTER — Encounter (HOSPITAL_COMMUNITY): Payer: Self-pay | Admitting: Pharmacy Technician

## 2011-10-23 ENCOUNTER — Other Ambulatory Visit: Payer: Self-pay | Admitting: *Deleted

## 2011-10-23 ENCOUNTER — Other Ambulatory Visit: Payer: Self-pay | Admitting: Cardiothoracic Surgery

## 2011-10-23 DIAGNOSIS — I2510 Atherosclerotic heart disease of native coronary artery without angina pectoris: Secondary | ICD-10-CM

## 2011-10-27 ENCOUNTER — Ambulatory Visit (HOSPITAL_COMMUNITY)
Admission: RE | Admit: 2011-10-27 | Discharge: 2011-10-27 | Disposition: A | Discharge status: Home / Self Care | Payer: Medicare Other | Source: Ambulatory Visit | Attending: Cardiothoracic Surgery | Admitting: Cardiothoracic Surgery

## 2011-10-27 ENCOUNTER — Encounter (HOSPITAL_COMMUNITY): Payer: Self-pay

## 2011-10-27 ENCOUNTER — Encounter (HOSPITAL_COMMUNITY)
Admission: RE | Admit: 2011-10-27 | Discharge: 2011-10-27 | Disposition: A | Discharge status: Home / Self Care | Payer: Medicare Other | Source: Ambulatory Visit | Attending: Cardiothoracic Surgery | Admitting: Cardiothoracic Surgery

## 2011-10-27 VITALS — BP 161/81 | HR 68 | Temp 98.5°F | Resp 20 | Ht 66.0 in | Wt 192.5 lb

## 2011-10-27 DIAGNOSIS — J4489 Other specified chronic obstructive pulmonary disease: Secondary | ICD-10-CM | POA: Insufficient documentation

## 2011-10-27 DIAGNOSIS — Z01812 Encounter for preprocedural laboratory examination: Secondary | ICD-10-CM | POA: Insufficient documentation

## 2011-10-27 DIAGNOSIS — Z01818 Encounter for other preprocedural examination: Secondary | ICD-10-CM | POA: Insufficient documentation

## 2011-10-27 DIAGNOSIS — I2510 Atherosclerotic heart disease of native coronary artery without angina pectoris: Secondary | ICD-10-CM

## 2011-10-27 DIAGNOSIS — Z01810 Encounter for preprocedural cardiovascular examination: Secondary | ICD-10-CM

## 2011-10-27 DIAGNOSIS — J449 Chronic obstructive pulmonary disease, unspecified: Secondary | ICD-10-CM | POA: Insufficient documentation

## 2011-10-27 DIAGNOSIS — R0602 Shortness of breath: Secondary | ICD-10-CM | POA: Insufficient documentation

## 2011-10-27 HISTORY — DX: Shortness of breath: R06.02

## 2011-10-27 HISTORY — DX: Nausea with vomiting, unspecified: R11.2

## 2011-10-27 HISTORY — DX: Insomnia, unspecified: G47.00

## 2011-10-27 HISTORY — DX: Other specified postprocedural states: Z98.890

## 2011-10-27 HISTORY — DX: Anxiety disorder, unspecified: F41.9

## 2011-10-27 HISTORY — DX: Dizziness and giddiness: R42

## 2011-10-27 HISTORY — DX: Gastro-esophageal reflux disease without esophagitis: K21.9

## 2011-10-27 HISTORY — DX: Personal history of other diseases of the musculoskeletal system and connective tissue: Z87.39

## 2011-10-27 HISTORY — DX: Benign prostatic hyperplasia without lower urinary tract symptoms: N40.0

## 2011-10-27 HISTORY — DX: Other constipation: K59.09

## 2011-10-27 HISTORY — DX: Personal history of other diseases of the digestive system: Z87.19

## 2011-10-27 HISTORY — DX: Pain in unspecified joint: M25.50

## 2011-10-27 HISTORY — DX: Headache: R51

## 2011-10-27 HISTORY — DX: Depression, unspecified: F32.A

## 2011-10-27 HISTORY — DX: Major depressive disorder, single episode, unspecified: F32.9

## 2011-10-27 LAB — COMPREHENSIVE METABOLIC PANEL
ALT: 19 U/L (ref 0–53)
AST: 23 U/L (ref 0–37)
Albumin: 4.1 g/dL (ref 3.5–5.2)
Alkaline Phosphatase: 52 U/L (ref 39–117)
BUN: 18 mg/dL (ref 6–23)
CO2: 18 mEq/L — ABNORMAL LOW (ref 19–32)
Calcium: 10.2 mg/dL (ref 8.4–10.5)
Chloride: 98 mEq/L (ref 96–112)
Creatinine, Ser: 1.19 mg/dL (ref 0.50–1.35)
GFR calc Af Amer: 65 mL/min — ABNORMAL LOW (ref >90)
GFR calc non Af Amer: 56 mL/min — ABNORMAL LOW (ref >90)
Glucose, Bld: 142 mg/dL — ABNORMAL HIGH (ref 70–99)
Potassium: 4.1 mEq/L (ref 3.5–5.1)
Sodium: 134 mEq/L — ABNORMAL LOW (ref 135–145)
Total Bilirubin: 0.8 mg/dL (ref 0.3–1.2)
Total Protein: 8 g/dL (ref 6.0–8.3)

## 2011-10-27 LAB — URINALYSIS, ROUTINE W REFLEX MICROSCOPIC
Bilirubin Urine: NEGATIVE
Glucose, UA: NEGATIVE mg/dL
Hgb urine dipstick: NEGATIVE
Ketones, ur: NEGATIVE mg/dL
Leukocytes, UA: NEGATIVE
Nitrite: NEGATIVE
Protein, ur: NEGATIVE mg/dL
Specific Gravity, Urine: 1.016 (ref 1.005–1.030)
Urobilinogen, UA: 1 mg/dL (ref 0.0–1.0)
pH: 5.5 (ref 5.0–8.0)

## 2011-10-27 LAB — PULMONARY FUNCTION TEST

## 2011-10-27 LAB — BLOOD GAS, ARTERIAL
Acid-Base Excess: 0.8 mmol/L (ref 0.0–2.0)
Bicarbonate: 24.8 mEq/L — ABNORMAL HIGH (ref 20.0–24.0)
O2 Saturation: 96 %
Patient temperature: 98.6
TCO2: 26 mmol/L (ref 0–100)
pCO2 arterial: 39.5 mmHg (ref 35.0–45.0)
pH, Arterial: 7.414 (ref 7.350–7.450)
pO2, Arterial: 78.4 mmHg — ABNORMAL LOW (ref 80.0–100.0)

## 2011-10-27 LAB — TYPE AND SCREEN
ABO/RH(D): A POS
Antibody Screen: NEGATIVE

## 2011-10-27 LAB — PROTIME-INR
INR: 1.08 (ref 0.00–1.49)
Prothrombin Time: 14.2 seconds (ref 11.6–15.2)

## 2011-10-27 LAB — CBC
HCT: 46.4 % (ref 39.0–52.0)
Hemoglobin: 16.5 g/dL (ref 13.0–17.0)
MCH: 32.5 pg (ref 26.0–34.0)
MCHC: 35.6 g/dL (ref 30.0–36.0)
MCV: 91.5 fL (ref 78.0–100.0)
Platelets: 152 10*3/uL (ref 150–400)
RBC: 5.07 MIL/uL (ref 4.22–5.81)
RDW: 13.1 % (ref 11.5–15.5)
WBC: 7.4 10*3/uL (ref 4.0–10.5)

## 2011-10-27 LAB — SURGICAL PCR SCREEN
MRSA, PCR: NEGATIVE
Staphylococcus aureus: NEGATIVE

## 2011-10-27 LAB — APTT: aPTT: 45 seconds — ABNORMAL HIGH (ref 24–37)

## 2011-10-27 LAB — ABO/RH: ABO/RH(D): A POS

## 2011-10-27 NOTE — Progress Notes (Signed)
Forwarded to [employer] for elevated PTT

## 2011-10-27 NOTE — Pre-Procedure Instructions (Signed)
[address]  10/27/2011   Your procedure is scheduled on:  Thurs, Aug 29 @ 7:30 AM  Report to [facility] at 5:30 AM.  Call this number if you have problems the morning of surgery: [phone]   Remember:   Do not eat food:After Midnight.  Take these medicines the morning of surgery with A SIP OF WATER:    Do not wear jewelry  Do not wear lotions, powders, or colognes  Men may shave face and neck.  Do not bring valuables to the hospital.  Contacts, dentures or bridgework may not be worn into surgery.  Leave suitcase in the car. After surgery it may be brought to your room.  For patients admitted to the hospital, checkout time is 11:00 AM the day of discharge.   Patients discharged the day of surgery will not be allowed to drive home.  Special Instructions: Incentive Spirometry - Practice and bring it with you on the day of surgery. and CHG Shower Use Special Wash: 1/2 bottle night before surgery and 1/2 bottle morning of surgery.   Please read over the following fact sheets that you were given: Pain Booklet, Coughing and Deep Breathing, Blood Transfusion Information, Open Heart Packet, MRSA Information and Surgical Site Infection Prevention

## 2011-10-27 NOTE — Progress Notes (Signed)
VASCULAR LAB PRELIMINARY  PRELIMINARY  PRELIMINARY  PRELIMINARY  Pre CABG Doppler/duplex completed.    Preliminary report:  Final report is in Results Review.  Demarco Bacci, 10/27/2011, 5:27 PM

## 2011-10-27 NOTE — Progress Notes (Signed)
PFT done at 1200 Dopplers done at 1300

## 2011-10-27 NOTE — Progress Notes (Signed)
Dr.skains is cardiologist and last office visit a week ago and to request report  Denies ever having an echo Stress test done within the last yr Heart cath report in epic from 10/2011  Dr.Elizabeth Zachery Dauer with [facility]

## 2011-10-28 LAB — HEMOGLOBIN A1C
Hgb A1c MFr Bld: 8.2 % — ABNORMAL HIGH (ref <5.7)
Mean Plasma Glucose: 189 mg/dL — ABNORMAL HIGH (ref <117)

## 2011-10-28 MED ORDER — SODIUM BICARBONATE 8.4 % IV SOLN
INTRAVENOUS | Status: AC
  Administered 2011-10-29: 09:00:00
  Filled 2011-10-28: qty 2.5

## 2011-10-28 MED ORDER — PHENYLEPHRINE HCL 10 MG/ML IJ SOLN
30.0000 ug/min | INTRAVENOUS | Status: DC
  Filled 2011-10-28: qty 2

## 2011-10-28 MED ORDER — SODIUM CHLORIDE 0.9 % IV SOLN
1500.0000 mg | INTRAVENOUS | Status: AC
  Administered 2011-10-29: 1500 mg via INTRAVENOUS
  Filled 2011-10-28: qty 1500

## 2011-10-28 MED ORDER — INSULIN REGULAR HUMAN 100 UNIT/ML IJ SOLN
INTRAMUSCULAR | Status: DC
  Filled 2011-10-28: qty 1

## 2011-10-28 MED ORDER — TRANEXAMIC ACID (OHS) PUMP PRIME SOLUTION
2.0000 mg/kg | INTRAVENOUS | Status: DC
  Filled 2011-10-28: qty 1.75

## 2011-10-28 MED ORDER — DEXMEDETOMIDINE HCL IN NACL 400 MCG/100ML IV SOLN
0.1000 ug/kg/h | INTRAVENOUS | Status: DC
  Filled 2011-10-28: qty 100

## 2011-10-28 MED ORDER — DOPAMINE-DEXTROSE 3.2-5 MG/ML-% IV SOLN
2.0000 ug/kg/min | INTRAVENOUS | Status: DC
  Filled 2011-10-28: qty 250

## 2011-10-28 MED ORDER — CEFUROXIME SODIUM 1.5 G IJ SOLR
1.5000 g | INTRAMUSCULAR | Status: AC
  Administered 2011-10-29: 1.5 g via INTRAVENOUS
  Administered 2011-10-29: .75 g via INTRAVENOUS
  Filled 2011-10-28: qty 1.5

## 2011-10-28 MED ORDER — TRANEXAMIC ACID 100 MG/ML IV SOLN
1.5000 mg/kg/h | INTRAVENOUS | Status: DC
  Filled 2011-10-28: qty 25

## 2011-10-28 MED ORDER — NITROGLYCERIN IN D5W 200-5 MCG/ML-% IV SOLN
2.0000 ug/min | INTRAVENOUS | Status: DC
  Filled 2011-10-28: qty 250

## 2011-10-28 MED ORDER — MAGNESIUM SULFATE 50 % IJ SOLN
40.0000 meq | INTRAMUSCULAR | Status: DC
  Filled 2011-10-28: qty 10

## 2011-10-28 MED ORDER — EPINEPHRINE HCL 1 MG/ML IJ SOLN
0.5000 ug/min | INTRAVENOUS | Status: DC
  Filled 2011-10-28: qty 4

## 2011-10-28 MED ORDER — DEXTROSE 5 % IV SOLN
750.0000 mg | INTRAVENOUS | Status: DC
  Filled 2011-10-28: qty 750

## 2011-10-28 MED ORDER — POTASSIUM CHLORIDE 2 MEQ/ML IV SOLN
80.0000 meq | INTRAVENOUS | Status: DC
  Filled 2011-10-28: qty 40

## 2011-10-28 MED ORDER — TRANEXAMIC ACID (OHS) BOLUS VIA INFUSION
15.0000 mg/kg | INTRAVENOUS | Status: AC
  Administered 2011-10-29: 1309.5 mg via INTRAVENOUS
  Filled 2011-10-28: qty 1310

## 2011-10-28 NOTE — Consult Note (Signed)
Anesthesia Chart Review:  Patient is a 78 year old male scheduled for CABG on 10/29/2011 by Dr. Donata Clay.  History includes nonsmoker, obesity, postoperative N/V, hyperlipidemia, coronary artery disease s/p LAD/CX DES '09, hypertension, headaches, GERD, BPH, anxiety, hiatal hernia, diabetes, mellitus type II, depression, gout, insomnia, CKD stage III, chronic DOE, cancer of the nose.  Cardiologist is Dr. Anne Fu [facility]).  Patient was symptomatic and had an abnormal stress test in July 2013 which lead to a cardiac cath on 10/21/11 that showed: IMPRESSIONS:  1. Significant three-vessel coronary artery disease, progression from prior catheterization in 2009. Proximal LAD 90%, proximal RCA 99%, first obtuse marginal 70% just following stent placement. Previously placed stents are widely patent. 2. Normal left ventricular systolic function. LVEDP 12 mmHg. Ejection fraction 55%.  EKG on 10/27/11 showed NSR, sinus arrhythmia, first-degree AV block, possible LAE.  CXR on 10/27/11 showed no acute abnormality. Probable chronic interstitial obstructive lung disease. PFTs on 10/27/11 showed FEV1 2.28 (94% predicted).  Pre-CABG dopplers on 10/27/11 showed no significant ICA stenosis noted bilaterally. Normal ABIs and pedal waveforms.   Labs noted.  Cr 1.19.  Glucose 142.  H/H WNL.  PT/INR WNL.  PTT elevated at 45.  A1C elevated at 8.2.  I called the elevated A1C and PTT results to Sherre Poot, RN at [facility]. She will have Dr. Donata Clay review.  I have ordered a repeat PTT to be done on arrival.  Shonna Chock, PA-C

## 2011-10-29 ENCOUNTER — Inpatient Hospital Stay (HOSPITAL_COMMUNITY): Payer: Medicare Other

## 2011-10-29 ENCOUNTER — Encounter (HOSPITAL_COMMUNITY): Payer: Self-pay | Admitting: Vascular Surgery

## 2011-10-29 ENCOUNTER — Encounter (HOSPITAL_COMMUNITY)
Admission: RE | Disposition: A | Discharge status: Home / Self Care | Payer: Self-pay | Source: Ambulatory Visit | Attending: Cardiothoracic Surgery

## 2011-10-29 ENCOUNTER — Encounter (HOSPITAL_COMMUNITY): Payer: Self-pay | Admitting: *Deleted

## 2011-10-29 ENCOUNTER — Inpatient Hospital Stay (HOSPITAL_COMMUNITY)
Admission: RE | Admit: 2011-10-29 | Discharge: 2011-11-04 | DRG: 236 | Disposition: A | Discharge status: Home / Self Care | Payer: Medicare Other | Source: Ambulatory Visit | Attending: Cardiothoracic Surgery | Admitting: Cardiothoracic Surgery

## 2011-10-29 ENCOUNTER — Ambulatory Visit (HOSPITAL_COMMUNITY): Payer: Medicare Other | Admitting: Vascular Surgery

## 2011-10-29 DIAGNOSIS — I498 Other specified cardiac arrhythmias: Secondary | ICD-10-CM | POA: Diagnosis not present

## 2011-10-29 DIAGNOSIS — Z9861 Coronary angioplasty status: Secondary | ICD-10-CM

## 2011-10-29 DIAGNOSIS — E669 Obesity, unspecified: Secondary | ICD-10-CM | POA: Diagnosis present

## 2011-10-29 DIAGNOSIS — I2510 Atherosclerotic heart disease of native coronary artery without angina pectoris: Principal | ICD-10-CM | POA: Diagnosis present

## 2011-10-29 DIAGNOSIS — I4891 Unspecified atrial fibrillation: Secondary | ICD-10-CM | POA: Diagnosis not present

## 2011-10-29 DIAGNOSIS — Z87891 Personal history of nicotine dependence: Secondary | ICD-10-CM

## 2011-10-29 DIAGNOSIS — E785 Hyperlipidemia, unspecified: Secondary | ICD-10-CM | POA: Diagnosis present

## 2011-10-29 DIAGNOSIS — K219 Gastro-esophageal reflux disease without esophagitis: Secondary | ICD-10-CM | POA: Diagnosis present

## 2011-10-29 DIAGNOSIS — Z951 Presence of aortocoronary bypass graft: Secondary | ICD-10-CM

## 2011-10-29 DIAGNOSIS — N189 Chronic kidney disease, unspecified: Secondary | ICD-10-CM | POA: Diagnosis present

## 2011-10-29 DIAGNOSIS — K449 Diaphragmatic hernia without obstruction or gangrene: Secondary | ICD-10-CM | POA: Diagnosis present

## 2011-10-29 DIAGNOSIS — I129 Hypertensive chronic kidney disease with stage 1 through stage 4 chronic kidney disease, or unspecified chronic kidney disease: Secondary | ICD-10-CM | POA: Diagnosis present

## 2011-10-29 DIAGNOSIS — E8779 Other fluid overload: Secondary | ICD-10-CM | POA: Diagnosis not present

## 2011-10-29 DIAGNOSIS — I200 Unstable angina: Secondary | ICD-10-CM | POA: Diagnosis present

## 2011-10-29 DIAGNOSIS — E119 Type 2 diabetes mellitus without complications: Secondary | ICD-10-CM | POA: Diagnosis present

## 2011-10-29 DIAGNOSIS — Z8522 Personal history of malignant neoplasm of nasal cavities, middle ear, and accessory sinuses: Secondary | ICD-10-CM

## 2011-10-29 DIAGNOSIS — I4892 Unspecified atrial flutter: Secondary | ICD-10-CM

## 2011-10-29 DIAGNOSIS — IMO0002 Reserved for concepts with insufficient information to code with codable children: Secondary | ICD-10-CM

## 2011-10-29 DIAGNOSIS — D62 Acute posthemorrhagic anemia: Secondary | ICD-10-CM | POA: Diagnosis not present

## 2011-10-29 DIAGNOSIS — F411 Generalized anxiety disorder: Secondary | ICD-10-CM | POA: Diagnosis present

## 2011-10-29 HISTORY — PX: CORONARY ARTERY BYPASS GRAFT: SHX141

## 2011-10-29 LAB — POCT I-STAT 3, ART BLOOD GAS (G3+)
Acid-base deficit: 2 mmol/L (ref 0.0–2.0)
Acid-base deficit: 4 mmol/L — ABNORMAL HIGH (ref 0.0–2.0)
Acid-base deficit: 3 mmol/L — ABNORMAL HIGH (ref 0.0–2.0)
Acid-base deficit: 6 mmol/L — ABNORMAL HIGH (ref 0.0–2.0)
Acid-base deficit: 6 mmol/L — ABNORMAL HIGH (ref 0.0–2.0)
Acid-base deficit: 3 mmol/L — ABNORMAL HIGH (ref 0.0–2.0)
Bicarbonate: 23.1 mEq/L (ref 20.0–24.0)
Bicarbonate: 21.8 mEq/L (ref 20.0–24.0)
Bicarbonate: 22.8 mEq/L (ref 20.0–24.0)
Bicarbonate: 20.7 mEq/L (ref 20.0–24.0)
Bicarbonate: 21.2 mEq/L (ref 20.0–24.0)
Bicarbonate: 23.6 mEq/L (ref 20.0–24.0)
O2 Saturation: 100 %
O2 Saturation: 99 %
O2 Saturation: 88 %
O2 Saturation: 89 %
O2 Saturation: 91 %
O2 Saturation: 94 %
Patient temperature: 36
Patient temperature: 36.4
Patient temperature: 36.8
Patient temperature: 36.9
TCO2: 24 mmol/L (ref 0–100)
TCO2: 23 mmol/L (ref 0–100)
TCO2: 24 mmol/L (ref 0–100)
TCO2: 22 mmol/L (ref 0–100)
TCO2: 23 mmol/L (ref 0–100)
TCO2: 25 mmol/L (ref 0–100)
pCO2 arterial: 42.4 mmHg (ref 35.0–45.0)
pCO2 arterial: 39.5 mmHg (ref 35.0–45.0)
pCO2 arterial: 42.5 mmHg (ref 35.0–45.0)
pCO2 arterial: 41.2 mmHg (ref 35.0–45.0)
pCO2 arterial: 48.5 mmHg — ABNORMAL HIGH (ref 35.0–45.0)
pCO2 arterial: 47.5 mmHg — ABNORMAL HIGH (ref 35.0–45.0)
pH, Arterial: 7.345 — ABNORMAL LOW (ref 7.350–7.450)
pH, Arterial: 7.35 (ref 7.350–7.450)
pH, Arterial: 7.333 — ABNORMAL LOW (ref 7.350–7.450)
pH, Arterial: 7.306 — ABNORMAL LOW (ref 7.350–7.450)
pH, Arterial: 7.248 — ABNORMAL LOW (ref 7.350–7.450)
pH, Arterial: 7.303 — ABNORMAL LOW (ref 7.350–7.450)
pO2, Arterial: 284 mmHg — ABNORMAL HIGH (ref 80.0–100.0)
pO2, Arterial: 169 mmHg — ABNORMAL HIGH (ref 80.0–100.0)
pO2, Arterial: 55 mmHg — ABNORMAL LOW (ref 80.0–100.0)
pO2, Arterial: 61 mmHg — ABNORMAL LOW (ref 80.0–100.0)
pO2, Arterial: 70 mmHg — ABNORMAL LOW (ref 80.0–100.0)
pO2, Arterial: 79 mmHg — ABNORMAL LOW (ref 80.0–100.0)

## 2011-10-29 LAB — POCT I-STAT 4, (NA,K, GLUC, HGB,HCT)
Glucose, Bld: 205 mg/dL — ABNORMAL HIGH (ref 70–99)
Glucose, Bld: 127 mg/dL — ABNORMAL HIGH (ref 70–99)
Glucose, Bld: 150 mg/dL — ABNORMAL HIGH (ref 70–99)
Glucose, Bld: 141 mg/dL — ABNORMAL HIGH (ref 70–99)
Glucose, Bld: 161 mg/dL — ABNORMAL HIGH (ref 70–99)
Glucose, Bld: 138 mg/dL — ABNORMAL HIGH (ref 70–99)
HCT: 42 % (ref 39.0–52.0)
HCT: 29 % — ABNORMAL LOW (ref 39.0–52.0)
HCT: 39 % (ref 39.0–52.0)
HCT: 30 % — ABNORMAL LOW (ref 39.0–52.0)
HCT: 30 % — ABNORMAL LOW (ref 39.0–52.0)
HCT: 33 % — ABNORMAL LOW (ref 39.0–52.0)
Hemoglobin: 14.3 g/dL (ref 13.0–17.0)
Hemoglobin: 13.3 g/dL (ref 13.0–17.0)
Hemoglobin: 9.9 g/dL — ABNORMAL LOW (ref 13.0–17.0)
Hemoglobin: 10.2 g/dL — ABNORMAL LOW (ref 13.0–17.0)
Hemoglobin: 10.2 g/dL — ABNORMAL LOW (ref 13.0–17.0)
Hemoglobin: 11.2 g/dL — ABNORMAL LOW (ref 13.0–17.0)
Potassium: 4 mEq/L (ref 3.5–5.1)
Potassium: 3.8 mEq/L (ref 3.5–5.1)
Potassium: 4.1 mEq/L (ref 3.5–5.1)
Potassium: 5.3 mEq/L — ABNORMAL HIGH (ref 3.5–5.1)
Potassium: 5 mEq/L (ref 3.5–5.1)
Potassium: 3.9 mEq/L (ref 3.5–5.1)
Sodium: 139 mEq/L (ref 135–145)
Sodium: 133 mEq/L — ABNORMAL LOW (ref 135–145)
Sodium: 138 mEq/L (ref 135–145)
Sodium: 136 mEq/L (ref 135–145)
Sodium: 133 mEq/L — ABNORMAL LOW (ref 135–145)
Sodium: 137 mEq/L (ref 135–145)

## 2011-10-29 LAB — POCT I-STAT, CHEM 8
BUN: 18 mg/dL (ref 6–23)
Calcium, Ion: 1.24 mmol/L (ref 1.13–1.30)
Chloride: 106 mEq/L (ref 96–112)
Creatinine, Ser: 1.2 mg/dL (ref 0.50–1.35)
Glucose, Bld: 138 mg/dL — ABNORMAL HIGH (ref 70–99)
HCT: 32 % — ABNORMAL LOW (ref 39.0–52.0)
Hemoglobin: 10.9 g/dL — ABNORMAL LOW (ref 13.0–17.0)
Potassium: 3.8 mEq/L (ref 3.5–5.1)
Sodium: 141 mEq/L (ref 135–145)
TCO2: 22 mmol/L (ref 0–100)

## 2011-10-29 LAB — CBC
HCT: 34.6 % — ABNORMAL LOW (ref 39.0–52.0)
HCT: 33.6 % — ABNORMAL LOW (ref 39.0–52.0)
Hemoglobin: 12.4 g/dL — ABNORMAL LOW (ref 13.0–17.0)
Hemoglobin: 11.9 g/dL — ABNORMAL LOW (ref 13.0–17.0)
MCH: 32.7 pg (ref 26.0–34.0)
MCH: 32.3 pg (ref 26.0–34.0)
MCHC: 35.8 g/dL (ref 30.0–36.0)
MCHC: 35.4 g/dL (ref 30.0–36.0)
MCV: 91.3 fL (ref 78.0–100.0)
MCV: 91.3 fL (ref 78.0–100.0)
Platelets: 116 10*3/uL — ABNORMAL LOW (ref 150–400)
Platelets: 113 10*3/uL — ABNORMAL LOW (ref 150–400)
RBC: 3.79 MIL/uL — ABNORMAL LOW (ref 4.22–5.81)
RBC: 3.68 MIL/uL — ABNORMAL LOW (ref 4.22–5.81)
RDW: 13.1 % (ref 11.5–15.5)
RDW: 13.1 % (ref 11.5–15.5)
WBC: 11.2 10*3/uL — ABNORMAL HIGH (ref 4.0–10.5)
WBC: 12.7 10*3/uL — ABNORMAL HIGH (ref 4.0–10.5)

## 2011-10-29 LAB — MAGNESIUM: Magnesium: 2.9 mg/dL — ABNORMAL HIGH (ref 1.5–2.5)

## 2011-10-29 LAB — PROTIME-INR
INR: 1.38 (ref 0.00–1.49)
Prothrombin Time: 17.2 seconds — ABNORMAL HIGH (ref 11.6–15.2)

## 2011-10-29 LAB — PLATELET COUNT: Platelets: 127 10*3/uL — ABNORMAL LOW (ref 150–400)

## 2011-10-29 LAB — HEMOGLOBIN AND HEMATOCRIT, BLOOD
HCT: 29.9 % — ABNORMAL LOW (ref 39.0–52.0)
Hemoglobin: 10.7 g/dL — ABNORMAL LOW (ref 13.0–17.0)

## 2011-10-29 LAB — APTT
aPTT: 43 s — ABNORMAL HIGH (ref 24–37)
aPTT: 39 seconds — ABNORMAL HIGH (ref 24–37)

## 2011-10-29 LAB — GLUCOSE, CAPILLARY
Glucose-Capillary: 165 mg/dL — ABNORMAL HIGH (ref 70–99)
Glucose-Capillary: 114 mg/dL — ABNORMAL HIGH (ref 70–99)
Glucose-Capillary: 100 mg/dL — ABNORMAL HIGH (ref 70–99)
Glucose-Capillary: 97 mg/dL (ref 70–99)
Glucose-Capillary: 114 mg/dL — ABNORMAL HIGH (ref 70–99)
Glucose-Capillary: 142 mg/dL — ABNORMAL HIGH (ref 70–99)
Glucose-Capillary: 136 mg/dL — ABNORMAL HIGH (ref 70–99)
Glucose-Capillary: 118 mg/dL — ABNORMAL HIGH (ref 70–99)

## 2011-10-29 LAB — CREATININE, SERUM
Creatinine, Ser: 1.15 mg/dL (ref 0.50–1.35)
GFR calc Af Amer: 68 mL/min — ABNORMAL LOW (ref >90)
GFR calc non Af Amer: 59 mL/min — ABNORMAL LOW (ref >90)

## 2011-10-29 LAB — POCT I-STAT GLUCOSE
Glucose, Bld: 139 mg/dL — ABNORMAL HIGH (ref 70–99)
Operator id: 3406

## 2011-10-29 SURGERY — CORONARY ARTERY BYPASS GRAFTING (CABG)
Anesthesia: General | Site: Chest | Wound class: Clean

## 2011-10-29 MED ORDER — MIDAZOLAM HCL 2 MG/2ML IJ SOLN
2.0000 mg | INTRAMUSCULAR | Status: DC | PRN
  Filled 2011-10-29: qty 2

## 2011-10-29 MED ORDER — LACTATED RINGERS IV SOLN
INTRAVENOUS | Status: DC | PRN
  Administered 2011-10-29: 07:00:00 via INTRAVENOUS

## 2011-10-29 MED ORDER — SODIUM CHLORIDE 0.9 % IV SOLN
10.0000 mg | INTRAVENOUS | Status: DC | PRN
  Administered 2011-10-29: 40 ug/min via INTRAVENOUS

## 2011-10-29 MED ORDER — NITROGLYCERIN IN D5W 200-5 MCG/ML-% IV SOLN
INTRAVENOUS | Status: DC | PRN
  Administered 2011-10-29: 16.6 ug/min via INTRAVENOUS

## 2011-10-29 MED ORDER — ROCURONIUM BROMIDE 100 MG/10ML IV SOLN
INTRAVENOUS | Status: DC | PRN
  Administered 2011-10-29: 50 mg via INTRAVENOUS
  Administered 2011-10-29: 20 mg via INTRAVENOUS
  Administered 2011-10-29: 80 mg via INTRAVENOUS

## 2011-10-29 MED ORDER — METOCLOPRAMIDE HCL 5 MG/ML IJ SOLN
10.0000 mg | Freq: Four times a day (QID) | INTRAMUSCULAR | Status: DC
  Administered 2011-10-29 – 2011-11-01 (×11): 10 mg via INTRAVENOUS
  Filled 2011-10-29 (×15): qty 2

## 2011-10-29 MED ORDER — MORPHINE SULFATE 2 MG/ML IJ SOLN
2.0000 mg | INTRAMUSCULAR | Status: DC | PRN
  Administered 2011-10-30: 4 mg via INTRAVENOUS
  Administered 2011-10-30: 2 mg via INTRAVENOUS
  Filled 2011-10-29: qty 1
  Filled 2011-10-29: qty 2

## 2011-10-29 MED ORDER — METOPROLOL TARTRATE 1 MG/ML IV SOLN: 2.5000 mg | INTRAVENOUS | Status: DC | PRN

## 2011-10-29 MED ORDER — PHENYLEPHRINE HCL 10 MG/ML IJ SOLN
0.0000 ug/min | INTRAMUSCULAR | Status: DC
  Filled 2011-10-29: qty 2

## 2011-10-29 MED ORDER — BISACODYL 10 MG RE SUPP: 10.0000 mg | Freq: Every day | RECTAL | Status: DC

## 2011-10-29 MED ORDER — VANCOMYCIN HCL IN DEXTROSE 1-5 GM/200ML-% IV SOLN
1000.0000 mg | Freq: Two times a day (BID) | INTRAVENOUS | Status: AC
  Administered 2011-10-29 – 2011-10-31 (×4): 1000 mg via INTRAVENOUS
  Filled 2011-10-29 (×4): qty 200

## 2011-10-29 MED ORDER — METOPROLOL TARTRATE 12.5 MG HALF TABLET
ORAL_TABLET | ORAL | Status: AC
  Administered 2011-10-29: 12.5 mg via ORAL
  Filled 2011-10-29: qty 1

## 2011-10-29 MED ORDER — METOPROLOL TARTRATE 25 MG/10 ML ORAL SUSPENSION
12.5000 mg | Freq: Two times a day (BID) | ORAL | Status: DC
  Filled 2011-10-29 (×7): qty 5

## 2011-10-29 MED ORDER — POTASSIUM CHLORIDE 10 MEQ/50ML IV SOLN
10.0000 meq | INTRAVENOUS | Status: AC
  Administered 2011-10-29: 10 meq via INTRAVENOUS

## 2011-10-29 MED ORDER — PROTAMINE SULFATE 10 MG/ML IV SOLN
INTRAVENOUS | Status: DC | PRN
  Administered 2011-10-29: 230 mg via INTRAVENOUS

## 2011-10-29 MED ORDER — NITROGLYCERIN IN D5W 200-5 MCG/ML-% IV SOLN: 0.0000 ug/min | INTRAVENOUS | Status: DC

## 2011-10-29 MED ORDER — ASPIRIN EC 325 MG PO TBEC
325.0000 mg | DELAYED_RELEASE_TABLET | Freq: Every day | ORAL | Status: DC
  Administered 2011-10-30 – 2011-11-01 (×3): 325 mg via ORAL
  Filled 2011-10-29 (×3): qty 1

## 2011-10-29 MED ORDER — ONDANSETRON HCL 4 MG/2ML IJ SOLN
4.0000 mg | Freq: Four times a day (QID) | INTRAMUSCULAR | Status: DC | PRN
  Administered 2011-10-29: 4 mg via INTRAVENOUS
  Filled 2011-10-29: qty 2

## 2011-10-29 MED ORDER — BISACODYL 5 MG PO TBEC
10.0000 mg | DELAYED_RELEASE_TABLET | Freq: Every day | ORAL | Status: DC
  Administered 2011-10-30 – 2011-11-01 (×3): 10 mg via ORAL
  Filled 2011-10-29 (×3): qty 2
  Filled 2011-10-29: qty 1

## 2011-10-29 MED ORDER — SODIUM CHLORIDE 0.9 % IV SOLN: 250.0000 mL | INTRAVENOUS | Status: DC

## 2011-10-29 MED ORDER — ACETAMINOPHEN 500 MG PO TABS
1000.0000 mg | ORAL_TABLET | Freq: Four times a day (QID) | ORAL | Status: DC
  Administered 2011-10-30 – 2011-11-01 (×9): 1000 mg via ORAL
  Filled 2011-10-29 (×13): qty 2

## 2011-10-29 MED ORDER — DEXTROSE 5 % IV SOLN
1.5000 g | Freq: Two times a day (BID) | INTRAVENOUS | Status: AC
  Administered 2011-10-29 – 2011-10-30 (×4): 1.5 g via INTRAVENOUS
  Filled 2011-10-29 (×4): qty 1.5

## 2011-10-29 MED ORDER — LACTATED RINGERS IV SOLN: INTRAVENOUS | Status: DC

## 2011-10-29 MED ORDER — SODIUM BICARBONATE 8.4 % IV SOLN
50.0000 meq | Freq: Once | INTRAVENOUS | Status: AC
  Administered 2011-10-29: 50 meq via INTRAVENOUS

## 2011-10-29 MED ORDER — SODIUM CHLORIDE 0.9 % IJ SOLN
OROMUCOSAL | Status: DC | PRN
  Administered 2011-10-29: 09:00:00 via TOPICAL

## 2011-10-29 MED ORDER — ACETAMINOPHEN 650 MG RE SUPP
650.0000 mg | RECTAL | Status: AC
  Administered 2011-10-29: 650 mg via RECTAL

## 2011-10-29 MED ORDER — MAGNESIUM SULFATE 40 MG/ML IJ SOLN
4.0000 g | Freq: Once | INTRAMUSCULAR | Status: AC
Start: 2011-10-29 — End: 2011-10-29
  Administered 2011-10-29: 4 g via INTRAVENOUS
  Filled 2011-10-29: qty 100

## 2011-10-29 MED ORDER — SODIUM CHLORIDE 0.9 % IV SOLN
100.0000 [IU] | INTRAVENOUS | Status: DC | PRN
  Administered 2011-10-29: 4.4 [IU]/h via INTRAVENOUS

## 2011-10-29 MED ORDER — DEXMEDETOMIDINE HCL IN NACL 200 MCG/50ML IV SOLN: 0.1000 ug/kg/h | INTRAVENOUS | Status: DC

## 2011-10-29 MED ORDER — EZETIMIBE 10 MG PO TABS
10.0000 mg | ORAL_TABLET | Freq: Every day | ORAL | Status: DC
  Administered 2011-10-30 – 2011-11-03 (×5): 10 mg via ORAL
  Filled 2011-10-29 (×6): qty 1

## 2011-10-29 MED ORDER — CHLORHEXIDINE GLUCONATE 4 % EX LIQD: 30.0000 mL | CUTANEOUS | Status: DC

## 2011-10-29 MED ORDER — FAMOTIDINE IN NACL 20-0.9 MG/50ML-% IV SOLN
20.0000 mg | Freq: Two times a day (BID) | INTRAVENOUS | Status: AC
  Administered 2011-10-29: 20 mg via INTRAVENOUS

## 2011-10-29 MED ORDER — LACTATED RINGERS IV SOLN: 500.0000 mL | Freq: Once | INTRAVENOUS | Status: AC | PRN

## 2011-10-29 MED ORDER — DOPAMINE-DEXTROSE 3.2-5 MG/ML-% IV SOLN
INTRAVENOUS | Status: DC | PRN
  Administered 2011-10-29: 3 ug/kg/min via INTRAVENOUS

## 2011-10-29 MED ORDER — LEVALBUTEROL HCL 0.63 MG/3ML IN NEBU
0.6300 mg | INHALATION_SOLUTION | Freq: Once | RESPIRATORY_TRACT | Status: AC
  Administered 2011-10-29: 0.63 mg via RESPIRATORY_TRACT
  Filled 2011-10-29: qty 3

## 2011-10-29 MED ORDER — LACTATED RINGERS IV SOLN
INTRAVENOUS | Status: DC | PRN
  Administered 2011-10-29 (×2): via INTRAVENOUS

## 2011-10-29 MED ORDER — DEXMEDETOMIDINE HCL IN NACL 200 MCG/50ML IV SOLN
0.4000 ug/kg/h | INTRAVENOUS | Status: DC
  Filled 2011-10-29: qty 50

## 2011-10-29 MED ORDER — ASPIRIN 81 MG PO CHEW: 324.0000 mg | CHEWABLE_TABLET | Freq: Every day | ORAL | Status: DC

## 2011-10-29 MED ORDER — METOPROLOL TARTRATE 12.5 MG HALF TABLET
12.5000 mg | ORAL_TABLET | Freq: Once | ORAL | Status: AC
  Administered 2011-10-29: 12.5 mg via ORAL

## 2011-10-29 MED ORDER — ACETAMINOPHEN 160 MG/5ML PO SOLN: 650.0000 mg | ORAL | Status: AC

## 2011-10-29 MED ORDER — FENTANYL CITRATE 0.05 MG/ML IJ SOLN
INTRAMUSCULAR | Status: DC | PRN
  Administered 2011-10-29 (×2): 250 ug via INTRAVENOUS
  Administered 2011-10-29: 400 ug via INTRAVENOUS
  Administered 2011-10-29: 250 ug via INTRAVENOUS
  Administered 2011-10-29: 100 ug via INTRAVENOUS
  Administered 2011-10-29: 250 ug via INTRAVENOUS

## 2011-10-29 MED ORDER — SODIUM CHLORIDE 0.9 % IV SOLN
INTRAVENOUS | Status: DC
  Administered 2011-10-29: 14:00:00 via INTRAVENOUS
  Administered 2011-10-30: 20 mL/h via INTRAVENOUS

## 2011-10-29 MED ORDER — ALBUMIN HUMAN 5 % IV SOLN
INTRAVENOUS | Status: DC | PRN
  Administered 2011-10-29 (×2): via INTRAVENOUS

## 2011-10-29 MED ORDER — SODIUM CHLORIDE 0.9 % IV SOLN
200.0000 ug | INTRAVENOUS | Status: DC | PRN
  Administered 2011-10-29: 0.2 ug/kg/h via INTRAVENOUS

## 2011-10-29 MED ORDER — SODIUM CHLORIDE 0.9 % IJ SOLN: 3.0000 mL | INTRAMUSCULAR | Status: DC | PRN

## 2011-10-29 MED ORDER — ALBUMIN HUMAN 5 % IV SOLN
250.0000 mL | INTRAVENOUS | Status: AC | PRN
  Administered 2011-10-29 (×2): 250 mL via INTRAVENOUS
  Filled 2011-10-29: qty 250

## 2011-10-29 MED ORDER — MIDAZOLAM HCL 5 MG/5ML IJ SOLN
INTRAMUSCULAR | Status: DC | PRN
  Administered 2011-10-29: 3 mg via INTRAVENOUS
  Administered 2011-10-29: 2 mg via INTRAVENOUS
  Administered 2011-10-29: 3 mg via INTRAVENOUS
  Administered 2011-10-29: 2 mg via INTRAVENOUS

## 2011-10-29 MED ORDER — PANTOPRAZOLE SODIUM 40 MG PO TBEC
40.0000 mg | DELAYED_RELEASE_TABLET | Freq: Every day | ORAL | Status: DC
  Administered 2011-10-31: 40 mg via ORAL
  Filled 2011-10-29: qty 1

## 2011-10-29 MED ORDER — TRANEXAMIC ACID 100 MG/ML IV SOLN
2500.0000 mg | INTRAVENOUS | Status: DC | PRN
  Administered 2011-10-29: 1.5 mg/kg/h via INTRAVENOUS

## 2011-10-29 MED ORDER — LIDOCAINE HCL (CARDIAC) 20 MG/ML IV SOLN
INTRAVENOUS | Status: DC | PRN
  Administered 2011-10-29: 50 mg via INTRAVENOUS
  Administered 2011-10-29: 100 mg via INTRAVENOUS

## 2011-10-29 MED ORDER — SODIUM CHLORIDE 0.9 % IJ SOLN
3.0000 mL | Freq: Two times a day (BID) | INTRAMUSCULAR | Status: DC
  Administered 2011-10-30 – 2011-11-01 (×5): 3 mL via INTRAVENOUS

## 2011-10-29 MED ORDER — MORPHINE SULFATE 2 MG/ML IJ SOLN: 1.0000 mg | INTRAMUSCULAR | Status: AC | PRN

## 2011-10-29 MED ORDER — INSULIN REGULAR BOLUS VIA INFUSION
0.0000 [IU] | Freq: Three times a day (TID) | INTRAVENOUS | Status: DC
  Administered 2011-10-30: 3.2 [IU] via INTRAVENOUS
  Filled 2011-10-29: qty 10

## 2011-10-29 MED ORDER — OXYCODONE HCL 5 MG PO TABS
5.0000 mg | ORAL_TABLET | ORAL | Status: DC | PRN
  Administered 2011-10-30 – 2011-10-31 (×6): 5 mg via ORAL
  Filled 2011-10-29 (×6): qty 1

## 2011-10-29 MED ORDER — ACETAMINOPHEN 160 MG/5ML PO SOLN
975.0000 mg | Freq: Four times a day (QID) | ORAL | Status: DC
  Filled 2011-10-29: qty 40.6

## 2011-10-29 MED ORDER — HEMOSTATIC AGENTS (NO CHARGE) OPTIME
TOPICAL | Status: DC | PRN
  Administered 2011-10-29: 1 via TOPICAL

## 2011-10-29 MED ORDER — DOCUSATE SODIUM 100 MG PO CAPS
200.0000 mg | ORAL_CAPSULE | Freq: Every day | ORAL | Status: DC
  Administered 2011-10-30 – 2011-11-01 (×3): 200 mg via ORAL
  Filled 2011-10-29 (×3): qty 2

## 2011-10-29 MED ORDER — HEPARIN SODIUM (PORCINE) 1000 UNIT/ML IJ SOLN
INTRAMUSCULAR | Status: DC | PRN
  Administered 2011-10-29: 5 mL via INTRAVENOUS
  Administered 2011-10-29: 20 mL via INTRAVENOUS

## 2011-10-29 MED ORDER — CALCIUM CHLORIDE 10 % IV SOLN
1.0000 g | Freq: Once | INTRAVENOUS | Status: AC
  Administered 2011-10-29: 1 g via INTRAVENOUS
  Filled 2011-10-29: qty 10

## 2011-10-29 MED ORDER — DOPAMINE-DEXTROSE 3.2-5 MG/ML-% IV SOLN: 3.0000 ug/kg/min | INTRAVENOUS | Status: DC

## 2011-10-29 MED ORDER — SODIUM CHLORIDE 0.9 % IV SOLN
INTRAVENOUS | Status: DC
  Administered 2011-10-30: 4.2 [IU]/h via INTRAVENOUS
  Filled 2011-10-29 (×2): qty 1

## 2011-10-29 MED ORDER — SODIUM CHLORIDE 0.45 % IV SOLN: INTRAVENOUS | Status: DC

## 2011-10-29 MED ORDER — ATORVASTATIN CALCIUM 20 MG PO TABS
20.0000 mg | ORAL_TABLET | Freq: Every day | ORAL | Status: DC
  Administered 2011-10-30 – 2011-11-03 (×5): 20 mg via ORAL
  Filled 2011-10-29 (×7): qty 1

## 2011-10-29 MED ORDER — METOPROLOL TARTRATE 12.5 MG HALF TABLET
12.5000 mg | ORAL_TABLET | Freq: Two times a day (BID) | ORAL | Status: DC
  Administered 2011-10-30 – 2011-11-01 (×2): 12.5 mg via ORAL
  Filled 2011-10-29 (×7): qty 1

## 2011-10-29 SURGICAL SUPPLY — 113 items
ADAPTER CARDIO PERF ANTE/RETRO (ADAPTER) ×3 IMPLANT
ADPR PRFSN 84XANTGRD RTRGD (ADAPTER) ×1
ATTRACTOMAT 16X20 MAGNETIC DRP (DRAPES) ×3 IMPLANT
BAG DECANTER FOR FLEXI CONT (MISCELLANEOUS) ×3 IMPLANT
BANDAGE ELASTIC 4 VELCRO ST LF (GAUZE/BANDAGES/DRESSINGS) ×3 IMPLANT
BANDAGE ELASTIC 6 VELCRO ST LF (GAUZE/BANDAGES/DRESSINGS) ×3 IMPLANT
BANDAGE GAUZE ELAST BULKY 4 IN (GAUZE/BANDAGES/DRESSINGS) ×3 IMPLANT
BASKET HEART  (ORDER IN 25'S) (MISCELLANEOUS) ×1
BASKET HEART (ORDER IN 25'S) (MISCELLANEOUS) ×1
BASKET HEART (ORDER IN 25S) (MISCELLANEOUS) ×1 IMPLANT
BLADE STERNUM SYSTEM 6 (BLADE) ×3 IMPLANT
BLADE SURG 11 STRL SS (BLADE) ×3 IMPLANT
BLADE SURG 12 STRL SS (BLADE) ×3 IMPLANT
BLADE SURG ROTATE 9660 (MISCELLANEOUS) ×6 IMPLANT
CANISTER SUCTION 2500CC (MISCELLANEOUS) ×3 IMPLANT
CANNULA AORTIC HI-FLOW 6.5M20F (CANNULA) ×3 IMPLANT
CANNULA GUNDRY RCSP 15FR (MISCELLANEOUS) ×3 IMPLANT
CANNULA VENOUS MAL SGL STG 40 (MISCELLANEOUS) IMPLANT
CANNULAE VENOUS MAL SGL STG 40 (MISCELLANEOUS)
CATH CPB KIT VANTRIGT (MISCELLANEOUS) ×3 IMPLANT
CATH ROBINSON RED A/P 18FR (CATHETERS) ×9 IMPLANT
CATH THORACIC 28FR (CATHETERS) IMPLANT
CATH THORACIC 28FR RT ANG (CATHETERS) IMPLANT
CATH THORACIC 36FR (CATHETERS) IMPLANT
CATH THORACIC 36FR RT ANG (CATHETERS) ×6 IMPLANT
CLIP TI WIDE RED SMALL 24 (CLIP) ×3 IMPLANT
CLOTH BEACON ORANGE TIMEOUT ST (SAFETY) ×3 IMPLANT
COVER SURGICAL LIGHT HANDLE (MISCELLANEOUS) ×3 IMPLANT
CRADLE DONUT ADULT HEAD (MISCELLANEOUS) ×3 IMPLANT
DRAIN CHANNEL 32F RND 10.7 FF (WOUND CARE) ×3 IMPLANT
DRAPE CARDIOVASCULAR INCISE (DRAPES) ×3
DRAPE INCISE IOBAN 66X45 STRL (DRAPES) ×3 IMPLANT
DRAPE SLUSH MACHINE 52X66 (DRAPES) IMPLANT
DRAPE SLUSH/WARMER DISC (DRAPES) IMPLANT
DRAPE SRG 135X102X78XABS (DRAPES) ×1 IMPLANT
DRSG COVADERM 4X14 (GAUZE/BANDAGES/DRESSINGS) ×3 IMPLANT
ELECT BLADE 4.0 EZ CLEAN MEGAD (MISCELLANEOUS) ×3
ELECT BLADE 6.5 EXT (BLADE) ×3 IMPLANT
ELECT CAUTERY BLADE 6.4 (BLADE) ×3 IMPLANT
ELECT REM PT RETURN 9FT ADLT (ELECTROSURGICAL) ×6
ELECTRODE BLDE 4.0 EZ CLN MEGD (MISCELLANEOUS) ×1 IMPLANT
ELECTRODE REM PT RTRN 9FT ADLT (ELECTROSURGICAL) ×2 IMPLANT
GLOVE BIO SURGEON STRL SZ 6 (GLOVE) ×9 IMPLANT
GLOVE BIO SURGEON STRL SZ 6.5 (GLOVE) ×4 IMPLANT
GLOVE BIO SURGEON STRL SZ7.5 (GLOVE) ×9 IMPLANT
GLOVE BIO SURGEONS STRL SZ 6.5 (GLOVE) ×2
GLOVE BIOGEL PI IND STRL 6 (GLOVE) ×2 IMPLANT
GLOVE BIOGEL PI IND STRL 7.0 (GLOVE) ×5 IMPLANT
GLOVE BIOGEL PI INDICATOR 6 (GLOVE) ×4
GLOVE BIOGEL PI INDICATOR 7.0 (GLOVE) ×10
GOWN PREVENTION PLUS XLARGE (GOWN DISPOSABLE) ×6 IMPLANT
GOWN STRL NON-REIN LRG LVL3 (GOWN DISPOSABLE) ×15 IMPLANT
HEMOSTAT POWDER SURGIFOAM 1G (HEMOSTASIS) ×9 IMPLANT
HEMOSTAT SURGICEL 2X14 (HEMOSTASIS) ×3 IMPLANT
INSERT FOGARTY XLG (MISCELLANEOUS) IMPLANT
KIT BASIN OR (CUSTOM PROCEDURE TRAY) ×3 IMPLANT
KIT ROOM TURNOVER OR (KITS) ×3 IMPLANT
KIT SUCTION CATH 14FR (SUCTIONS) ×3 IMPLANT
KIT VASOVIEW 6 PRO VH 2400 (KITS) ×3 IMPLANT
KIT VASOVIEW W/TROCAR VH 2000 (KITS) ×3 IMPLANT
LEAD PACING MYOCARDI (MISCELLANEOUS) ×3 IMPLANT
MARKER GRAFT CORONARY BYPASS (MISCELLANEOUS) ×9 IMPLANT
MARKER SKIN DUAL TIP RULER LAB (MISCELLANEOUS) ×3 IMPLANT
NS IRRIG 1000ML POUR BTL (IV SOLUTION) ×15 IMPLANT
PACK OPEN HEART (CUSTOM PROCEDURE TRAY) ×3 IMPLANT
PAD ARMBOARD 7.5X6 YLW CONV (MISCELLANEOUS) ×6 IMPLANT
PENCIL BUTTON HOLSTER BLD 10FT (ELECTRODE) ×6 IMPLANT
PUNCH AORTIC ROTATE 4.0MM (MISCELLANEOUS) ×3 IMPLANT
PUNCH AORTIC ROTATE 4.5MM 8IN (MISCELLANEOUS) IMPLANT
PUNCH AORTIC ROTATE 5MM 8IN (MISCELLANEOUS) IMPLANT
SET CARDIOPLEGIA MPS 5001102 (MISCELLANEOUS) ×3 IMPLANT
SOLUTION ANTI FOG 6CC (MISCELLANEOUS) ×3 IMPLANT
SPONGE GAUZE 4X4 12PLY (GAUZE/BANDAGES/DRESSINGS) ×12 IMPLANT
SPONGE LAP 18X18 X RAY DECT (DISPOSABLE) ×3 IMPLANT
SURGIFLO W/THROMBIN 8M KIT (HEMOSTASIS) ×3 IMPLANT
SUT BONE WAX W31G (SUTURE) ×3 IMPLANT
SUT MNCRL AB 4-0 PS2 18 (SUTURE) ×3 IMPLANT
SUT PROLENE 3 0 SH DA (SUTURE) IMPLANT
SUT PROLENE 3 0 SH1 36 (SUTURE) IMPLANT
SUT PROLENE 4 0 RB 1 (SUTURE) ×3
SUT PROLENE 4 0 SH DA (SUTURE) ×3 IMPLANT
SUT PROLENE 4-0 RB1 .5 CRCL 36 (SUTURE) ×1 IMPLANT
SUT PROLENE 5 0 C 1 36 (SUTURE) IMPLANT
SUT PROLENE 6 0 C 1 30 (SUTURE) ×3 IMPLANT
SUT PROLENE 7 0 DA (SUTURE) IMPLANT
SUT PROLENE 7.0 RB 3 (SUTURE) ×12 IMPLANT
SUT PROLENE 8 0 BV175 6 (SUTURE) IMPLANT
SUT PROLENE BLUE 7 0 (SUTURE) ×6 IMPLANT
SUT SILK  1 MH (SUTURE)
SUT SILK 1 MH (SUTURE) IMPLANT
SUT SILK 2 0 SH CR/8 (SUTURE) IMPLANT
SUT SILK 3 0 SH CR/8 (SUTURE) IMPLANT
SUT STEEL 6MS V (SUTURE) ×6 IMPLANT
SUT STEEL STERNAL CCS#1 18IN (SUTURE) IMPLANT
SUT STEEL SZ 6 DBL 3X14 BALL (SUTURE) ×3 IMPLANT
SUT VIC AB 1 CTX 36 (SUTURE) ×6
SUT VIC AB 1 CTX36XBRD ANBCTR (SUTURE) ×2 IMPLANT
SUT VIC AB 2-0 CT1 27 (SUTURE) ×3
SUT VIC AB 2-0 CT1 TAPERPNT 27 (SUTURE) ×1 IMPLANT
SUT VIC AB 2-0 CTX 27 (SUTURE) IMPLANT
SUT VIC AB 3-0 SH 27 (SUTURE)
SUT VIC AB 3-0 SH 27X BRD (SUTURE) IMPLANT
SUT VIC AB 3-0 X1 27 (SUTURE) IMPLANT
SUT VICRYL 4-0 PS2 18IN ABS (SUTURE) IMPLANT
SUTURE E-PAK OPEN HEART (SUTURE) ×3 IMPLANT
SYSTEM SAHARA CHEST DRAIN ATS (WOUND CARE) ×3 IMPLANT
TAPE CLOTH SURG 4X10 WHT LF (GAUZE/BANDAGES/DRESSINGS) ×6 IMPLANT
TOWEL OR 17X24 6PK STRL BLUE (TOWEL DISPOSABLE) ×6 IMPLANT
TOWEL OR 17X26 10 PK STRL BLUE (TOWEL DISPOSABLE) ×6 IMPLANT
TRAY FOLEY IC TEMP SENS 14FR (CATHETERS) ×3 IMPLANT
TUBING INSUFFLATION 10FT LAP (TUBING) ×3 IMPLANT
UNDERPAD 30X30 INCONTINENT (UNDERPADS AND DIAPERS) ×3 IMPLANT
WATER STERILE IRR 1000ML POUR (IV SOLUTION) ×6 IMPLANT

## 2011-10-29 NOTE — Progress Notes (Signed)
The patient was examined and preop studies reviewed. There has been no change from the prior exam and the patient is ready for surgery.  Plan CABG for multivessel CAD

## 2011-10-29 NOTE — Procedures (Signed)
Extubation Procedure Note  Patient Details:   Name: Cody Thomas DOB: 1931/01/07 MRN: [id number]   Airway Documentation:  Airway 8 mm (Active)   Patient weaned and extubated from mechanical ventilation. Tolerated procedure well. Evaluation  O2 sats: stable throughout Complications: No apparent complications Patient did tolerate procedure well. Bilateral Breath Sounds: Clear;Diminished Suctioning: Airway Yes  Clearance [name] 10/29/2011, 5:54 PM

## 2011-10-29 NOTE — Anesthesia Preprocedure Evaluation (Addendum)
Anesthesia Evaluation  Patient identified by MRN, date of birth, ID band Patient awake    Reviewed: Allergy & Precautions, H&P , NPO status   History of Anesthesia Complications (+) PONV  Airway Mallampati: II TM Distance: >3 FB Neck ROM: Full    Dental  (+) Edentulous Upper, Edentulous Lower and Dental Advisory Given   Pulmonary shortness of breath and with exertion, former smoker,  breath sounds clear to auscultation        Cardiovascular hypertension, Pt. on medications + CAD Rhythm:Regular Rate:Normal     Neuro/Psych  Headaches, Anxiety Depression    GI/Hepatic hiatal hernia, GERD-  Medicated and Controlled,  Endo/Other  Type 2  Renal/GU      Musculoskeletal   Abdominal   Peds  Hematology   Anesthesia Other Findings   Reproductive/Obstetrics                          Anesthesia Physical Anesthesia Plan  ASA: IV  Anesthesia Plan: General   Post-op Pain Management:    Induction: Intravenous  Airway Management Planned: Oral ETT  Additional Equipment: Arterial line, CVP, PA Cath and Ultrasound Guidance Line Placement  Intra-op Plan:   Post-operative Plan: Post-operative intubation/ventilation  Informed Consent: I have reviewed the patients History and Physical, chart, labs and discussed the procedure including the risks, benefits and alternatives for the proposed anesthesia with the patient or authorized representative who has indicated his/her understanding and acceptance.     Plan Discussed with: CRNA and Anesthesiologist  Anesthesia Plan Comments:        Anesthesia Quick Evaluation

## 2011-10-29 NOTE — Anesthesia Procedure Notes (Signed)
Procedure Name: Intubation Date/Time: 10/29/2011 8:19 AM Performed by: Rogelia Boga Pre-anesthesia Checklist: Patient identified, Emergency Drugs available, Suction available, Patient being monitored and Timeout performed Patient Re-evaluated:Patient Re-evaluated prior to inductionOxygen Delivery Method: Circle system utilized Preoxygenation: Pre-oxygenation with 100% oxygen Intubation Type: IV induction Ventilation: Mask ventilation without difficulty and Oral airway inserted - appropriate to patient size Laryngoscope Size: Mac and 4 Grade View: Grade II Tube type: Oral Tube size: 8.0 mm Number of attempts: 1 Airway Equipment and Method: Stylet Placement Confirmation: ETT inserted through vocal cords under direct vision,  positive ETCO2 and breath sounds checked- equal and bilateral Secured at: 21 cm Tube secured with: Tape Dental Injury: Teeth and Oropharynx as per pre-operative assessment

## 2011-10-29 NOTE — Transfer of Care (Signed)
Immediate Anesthesia Transfer of Care Note  Patient: Cody Thomas  Procedure(s) Performed: Procedure(s) (LRB): CORONARY ARTERY BYPASS GRAFTING (CABG) (N/A)  Patient Location: SICU  Anesthesia Type: General  Level of Consciousness: sedated and unresponsive  Airway & Oxygen Therapy: Patient remains intubated per anesthesia plan and Patient placed on Ventilator (see vital sign flow sheet for setting)  Post-op Assessment: Report given to PACU RN and Post -op Vital signs reviewed and stable  Post vital signs: Reviewed and stable  Complications: No apparent anesthesia complications

## 2011-10-29 NOTE — Brief Op Note (Signed)
10/29/2011  11:49 AM  PATIENT:  Cody Thomas  78 y.o. male  PRE-OPERATIVE DIAGNOSIS:  CAD  POST-OPERATIVE DIAGNOSIS:  CAD  PROCEDURE:  Procedure(s) (LRB):  CORONARY ARTERY BYPASS GRAFTING x4  LIMA to LAD  SVG to DIAGONAL  SVG to OM1  SVG to PDA  ENDOSCOPIC SAPHENOUS VEIN HARVEST RIGHT THIGH TO MID CALF  SURGEON:  Surgeon(s) and Role:    * Kerin Perna, MD - Primary  PHYSICIAN ASSISTANT: Lowella Dandy PA-C  ANESTHESIA:   general  EBL:  Total I/O In: 1900 [I.V.:1900] Out: -   BLOOD ADMINISTERED: CC CELLSAVER  DRAINS: Left pleural chest tube, mediastinal chest drains   LOCAL MEDICATIONS USED:  NONE  SPECIMEN:  No Specimen  DISPOSITION OF SPECIMEN:  N/A  COUNTS:  YES  TOURNIQUET:  * No tourniquets in log *  DICTATION: .Dragon Dictation  PLAN OF CARE: Admit to inpatient   PATIENT DISPOSITION:  ICU - intubated and hemodynamically stable.   Delay start of Pharmacological VTE agent (>24hrs) due to surgical blood loss or risk of bleeding: yes

## 2011-10-29 NOTE — Progress Notes (Signed)
Status post CABG x4 Extubated neuro intact Stable hemodynamics Adequate urine output Labs reviewed  Lab Results  Component Value Date   WBC 12.7* 10/29/2011   HGB 10.9* 10/29/2011   HCT 32.0* 10/29/2011   MCV 91.3 10/29/2011   PLT 113* 10/29/2011   Lab Results  Component Value Date   CREATININE 1.20 10/29/2011   BUN 18 10/29/2011   NA 141 10/29/2011   K 3.8 10/29/2011   CL 106 10/29/2011   CO2 18* 10/27/2011

## 2011-10-29 NOTE — Preoperative (Signed)
Beta Blockers   Reason not to administer Beta Blockers:Not Applicable 

## 2011-10-30 ENCOUNTER — Encounter (HOSPITAL_COMMUNITY): Payer: Self-pay | Admitting: Cardiothoracic Surgery

## 2011-10-30 ENCOUNTER — Inpatient Hospital Stay (HOSPITAL_COMMUNITY): Payer: Medicare Other

## 2011-10-30 LAB — CBC
HCT: 34.2 % — ABNORMAL LOW (ref 39.0–52.0)
HCT: 32.3 % — ABNORMAL LOW (ref 39.0–52.0)
Hemoglobin: 12 g/dL — ABNORMAL LOW (ref 13.0–17.0)
Hemoglobin: 11.1 g/dL — ABNORMAL LOW (ref 13.0–17.0)
MCH: 32.3 pg (ref 26.0–34.0)
MCH: 32.2 pg (ref 26.0–34.0)
MCHC: 35.1 g/dL (ref 30.0–36.0)
MCHC: 34.4 g/dL (ref 30.0–36.0)
MCV: 91.9 fL (ref 78.0–100.0)
MCV: 93.6 fL (ref 78.0–100.0)
Platelets: 112 10*3/uL — ABNORMAL LOW (ref 150–400)
Platelets: 104 10*3/uL — ABNORMAL LOW (ref 150–400)
RBC: 3.72 MIL/uL — ABNORMAL LOW (ref 4.22–5.81)
RBC: 3.45 MIL/uL — ABNORMAL LOW (ref 4.22–5.81)
RDW: 13.4 % (ref 11.5–15.5)
RDW: 13.7 % (ref 11.5–15.5)
WBC: 11.9 10*3/uL — ABNORMAL HIGH (ref 4.0–10.5)
WBC: 12.4 10*3/uL — ABNORMAL HIGH (ref 4.0–10.5)

## 2011-10-30 LAB — GLUCOSE, CAPILLARY
Glucose-Capillary: 109 mg/dL — ABNORMAL HIGH (ref 70–99)
Glucose-Capillary: 115 mg/dL — ABNORMAL HIGH (ref 70–99)
Glucose-Capillary: 126 mg/dL — ABNORMAL HIGH (ref 70–99)
Glucose-Capillary: 133 mg/dL — ABNORMAL HIGH (ref 70–99)
Glucose-Capillary: 108 mg/dL — ABNORMAL HIGH (ref 70–99)
Glucose-Capillary: 111 mg/dL — ABNORMAL HIGH (ref 70–99)
Glucose-Capillary: 114 mg/dL — ABNORMAL HIGH (ref 70–99)
Glucose-Capillary: 115 mg/dL — ABNORMAL HIGH (ref 70–99)
Glucose-Capillary: 115 mg/dL — ABNORMAL HIGH (ref 70–99)
Glucose-Capillary: 117 mg/dL — ABNORMAL HIGH (ref 70–99)
Glucose-Capillary: 146 mg/dL — ABNORMAL HIGH (ref 70–99)
Glucose-Capillary: 162 mg/dL — ABNORMAL HIGH (ref 70–99)
Glucose-Capillary: 98 mg/dL (ref 70–99)
Glucose-Capillary: 84 mg/dL (ref 70–99)
Glucose-Capillary: 130 mg/dL — ABNORMAL HIGH (ref 70–99)
Glucose-Capillary: 137 mg/dL — ABNORMAL HIGH (ref 70–99)
Glucose-Capillary: 153 mg/dL — ABNORMAL HIGH (ref 70–99)

## 2011-10-30 LAB — MAGNESIUM
Magnesium: 2.4 mg/dL (ref 1.5–2.5)
Magnesium: 2.2 mg/dL (ref 1.5–2.5)

## 2011-10-30 LAB — POCT I-STAT, CHEM 8
BUN: 21 mg/dL (ref 6–23)
Calcium, Ion: 1.19 mmol/L (ref 1.13–1.30)
Chloride: 103 mEq/L (ref 96–112)
Creatinine, Ser: 1.4 mg/dL — ABNORMAL HIGH (ref 0.50–1.35)
Glucose, Bld: 145 mg/dL — ABNORMAL HIGH (ref 70–99)
HCT: 32 % — ABNORMAL LOW (ref 39.0–52.0)
Hemoglobin: 10.9 g/dL — ABNORMAL LOW (ref 13.0–17.0)
Potassium: 4.2 mEq/L (ref 3.5–5.1)
Sodium: 139 mEq/L (ref 135–145)
TCO2: 23 mmol/L (ref 0–100)

## 2011-10-30 LAB — CREATININE, SERUM
Creatinine, Ser: 1.39 mg/dL — ABNORMAL HIGH (ref 0.50–1.35)
GFR calc Af Amer: 54 mL/min — ABNORMAL LOW (ref >90)
GFR calc non Af Amer: 47 mL/min — ABNORMAL LOW (ref >90)

## 2011-10-30 LAB — BASIC METABOLIC PANEL
BUN: 18 mg/dL (ref 6–23)
CO2: 26 mEq/L (ref 19–32)
Calcium: 8.9 mg/dL (ref 8.4–10.5)
Chloride: 106 meq/L (ref 96–112)
Creatinine, Ser: 1.19 mg/dL (ref 0.50–1.35)
GFR calc Af Amer: 65 mL/min — ABNORMAL LOW (ref >90)
GFR calc non Af Amer: 56 mL/min — ABNORMAL LOW (ref >90)
Glucose, Bld: 122 mg/dL — ABNORMAL HIGH (ref 70–99)
Potassium: 3.9 meq/L (ref 3.5–5.1)
Sodium: 141 mEq/L (ref 135–145)

## 2011-10-30 MED ORDER — INSULIN GLARGINE 100 UNIT/ML ~~LOC~~ SOLN
14.0000 [IU] | Freq: Two times a day (BID) | SUBCUTANEOUS | Status: DC
  Administered 2011-10-30 – 2011-11-01 (×4): 14 [IU] via SUBCUTANEOUS

## 2011-10-30 MED ORDER — AMIODARONE HCL 200 MG PO TABS
400.0000 mg | ORAL_TABLET | Freq: Two times a day (BID) | ORAL | Status: DC
  Administered 2011-10-30 – 2011-11-03 (×8): 400 mg via ORAL
  Filled 2011-10-30 (×9): qty 2

## 2011-10-30 MED ORDER — INSULIN ASPART 100 UNIT/ML ~~LOC~~ SOLN
0.0000 [IU] | SUBCUTANEOUS | Status: DC
  Administered 2011-10-30 (×2): 2 [IU] via SUBCUTANEOUS
  Administered 2011-10-31: 8 [IU] via SUBCUTANEOUS
  Administered 2011-10-31 (×4): 2 [IU] via SUBCUTANEOUS

## 2011-10-30 MED ORDER — FUROSEMIDE 10 MG/ML IJ SOLN
40.0000 mg | Freq: Two times a day (BID) | INTRAMUSCULAR | Status: AC
  Administered 2011-10-30 – 2011-10-31 (×3): 40 mg via INTRAVENOUS
  Filled 2011-10-30 (×2): qty 4

## 2011-10-30 MED FILL — Magnesium Sulfate Inj 50%: INTRAMUSCULAR | Qty: 10 | Status: AC

## 2011-10-30 MED FILL — Potassium Chloride Inj 2 mEq/ML: INTRAVENOUS | Qty: 40 | Status: AC

## 2011-10-30 MED FILL — Dexmedetomidine HCl IV Soln 200 MCG/2ML: INTRAVENOUS | Qty: 2 | Status: AC

## 2011-10-30 NOTE — Progress Notes (Signed)
Patient ID: Cody Thomas, male   DOB: 08/18/1930, 78 y.o.   MRN: [id number]  SICU evening rounds:  Hemodynamically stable  sats 95%  Urine output good.  CBC    Component Value Date/Time   WBC 12.4* 10/30/2011 1700   RBC 3.45* 10/30/2011 1700   HGB 11.1* 10/30/2011 1700   HCT 32.3* 10/30/2011 1700   PLT 104* 10/30/2011 1700   MCV 93.6 10/30/2011 1700   MCH 32.2 10/30/2011 1700   MCHC 34.4 10/30/2011 1700   RDW 13.7 10/30/2011 1700   LYMPHSABS 1.0 02/23/2008 1230   MONOABS 0.4 02/23/2008 1230   EOSABS 0.0 02/23/2008 1230   BASOSABS 0.0 02/23/2008 1230    BMET    Component Value Date/Time   NA 139 10/30/2011 1652   K 4.2 10/30/2011 1652   CL 103 10/30/2011 1652   CO2 26 10/30/2011 0405   GLUCOSE 145* 10/30/2011 1652   BUN 21 10/30/2011 1652   CREATININE 1.39* 10/30/2011 1700   CALCIUM 8.9 10/30/2011 0405   GFRNONAA 47* 10/30/2011 1700   GFRAA 54* 10/30/2011 1700

## 2011-10-30 NOTE — Care Management Note (Unsigned)
    Page 1 of 1   10/30/2011     3:21:09 PM   CARE MANAGEMENT NOTE 10/30/2011  Patient:  Cody Thomas, Cody Thomas   Account Number:  [id number]  Date Initiated:  10/30/2011  Documentation initiated by:  Miking Usrey  Subjective/Objective Assessment:   PT S/P CABG X 4 ON 10/29/11.  PTA, PT INDEPENDENT, LIVES WITH SPOUSE.     Action/Plan:   WIFE TO PROVIDE 24HR CARE AT DISCHARGE. WILL FOLLOW FOR HOME NEEDS AS PT PROGRESSES.   Anticipated DC Date:  11/03/2011   Anticipated DC Plan:  HOME W HOME HEALTH SERVICES      DC Planning Services  CM consult      Choice offered to / List presented to:             Status of service:  In process, will continue to follow Medicare Important Message given?   (If response is "NO", the following Medicare IM given date fields will be blank) Date Medicare IM given:   Date Additional Medicare IM given:    Discharge Disposition:    Per UR Regulation:    If discussed at Long Length of Stay Meetings, dates discussed:    Comments:

## 2011-10-30 NOTE — Anesthesia Postprocedure Evaluation (Signed)
  Anesthesia Post-op Note  Patient: Cody Thomas  Procedure(s) Performed: Procedure(s) (LRB): CORONARY ARTERY BYPASS GRAFTING (CABG) (N/A)  Patient Location: SICU  Anesthesia Type: General  Level of Consciousness: awake, alert  and oriented  Airway and Oxygen Therapy: Patient Spontanous Breathing and Patient connected to nasal cannula oxygen  Post-op Pain: mild  Post-op Assessment: Post-op Vital signs reviewed  Post-op Vital Signs: Reviewed  Complications: No apparent anesthesia complications

## 2011-10-30 NOTE — Op Note (Signed)
Cody Thomas, Cody Thomas NO.:  [id number]  MEDICAL RECORD NO.:  [id number]  LOCATION:  2316                         FACILITY:  [facility]  PHYSICIAN:  Kerin Perna, M.D.  DATE OF BIRTH:  [birth year]  DATE OF PROCEDURE:  10/29/2011 DATE OF DISCHARGE:                              OPERATIVE REPORT   OPERATION: 1. Coronary artery bypass grafting x4 (left internal mammary artery to     LAD, saphenous vein graft to diagonal, saphenous vein graft to     obtuse marginal, saphenous vein graft to posterior descending). 2. Endoscopic harvest of right leg greater saphenous vein.  SURGEON:  Kerin Perna, MD  ASSISTANT:  Lowella Dandy, PA-C  ANESTHESIA:  General.  INDICATIONS:  The patient is a 78 year old Caucasian male, obese, diabetic, who presents with symptoms of progressive angina and recent cardiac catheterizations by Dr. Anne Fu showing severe three-vessel CAD. Surgical revascularization was recommended.  I reviewed the results with cardiac catheterizations with the patient and family in the office and discussed the details indications, benefits and risks of multivessel bypass grafting for treatment of his severe coronary artery disease. They understood the potential risk of stroke, MI, bleeding, infection, and death.  He agreed to proceed under what I felt was an informed consent.  OPERATIVE PROCEDURE:  The patient was brought to the operative room and placed supine on the operating table where general anesthesia was induced.  The chest, abdomen, and legs were prepped with Betadine and draped as a sterile field.  A proper time-out was performed.  A sternal incision was made as the saphenous vein was harvested endoscopically from the right leg.  The left internal mammary artery was harvested as a pedicle graft from its origin at the subclavian vessels.  The sternal retractor was placed and the pericardium was opened and suspended. Pursestrings were placed in the  ascending aorta and right atrium, and after the vein had been harvested and inspected, the patient was heparinized and then cannulated and placed on cardiopulmonary bypass. The coronary arteries were identified for grafting, and the mammary artery and vein grafts were prepared for the distal anastomoses. Cardioplegia cannulas were placed both antegrade and retrograde cold blood cardioplegia and the patient was cooled to 32 degrees.  The crossclamp was applied.  An 800 mL of cold blood cardioplegia was delivered in the split doses between the antegrade aortic and retrograde coronary sinus catheters.  There was good cardioplegic arrest. Temperature of the interventricular septum dropped less than 14 degrees.  The distal coronary anastomoses were then performed.  The first distal anastomosis was to the posterior descending.  This was a 1.5-mm vessel, and there was a proximal 90% stenosis.  A reverse saphenous vein was sewn end-to-side with running 7-0 Prolene with good flow through the graft.  The second distal anastomosis was to the obtuse marginal branch of left coronary artery.  This had a proximal 80% stenosis.  A reverse saphenous vein was sewn end-to-side with running 7-0 Prolene with good flow through the graft.  Cardioplegia was redosed.  The third distal anastomosis was to the diagonal branch of the LAD, which had a proximal 80% stenosis.  A reverse saphenous vein of smaller caliber  was sewn end- to-side with running 7-0 Prolene with good flow through the graft.  The fourth distal anastomosis was to the distal third of the LAD.  This had a proximal 90% stenosis.  The left IMA pedicle was brought through an opening, created in the left lateral pericardium, was brought down onto the LAD and sewn end-to-side with running 8-0 Prolene.  There was good flow through the anastomosis after briefly releasing the pedicle bulldog on the mammary artery.  The bulldog was reapplied and the  pedicle was secured to the Epicardium with 6-0 Prolenes.  Cardioplegia was redosed.  While the crossclamp was still in place, three proximal vein anastomoses were performed on the ascending aorta with a 4.0-mm punch with running 7- 0 Prolene.  Air was vented from the coronary arteries with a dose of retrograde warm blood cardioplegia, and the final proximal anastomosis was tied and the crossclamp was removed.  The heart was resumed a spontaneous rhythm.  The vein grafts were de- aired and opened and each had good flow.  Hemostasis was documented at the proximal and distal sites.  The cardioplegia cannulas were removed. The patient was rewarmed to 37 degrees and temporary pacer wires were applied.  Low-dose dopamine was started and the lungs were expanded. The patient was then weaned off bypass without difficulty.  Echo showed good LV function.  Hemodynamics were normal.  Protamine was administered without adverse reaction.  The cannulas were removed.  The mediastinum was irrigated with warm saline.  The superior pericardial fat was closed over the aorta.  The anterior mediastinal and left pleural chest tube were placed and brought out through separate incisions.  The sternum was closed with wire.  The incision was then again irrigated.  The pectoralis fascia was closed with a running #1 Vicryl.  The subcutaneous and skin layers were closed with running Vicryl, and sterile dressing was applied.  Total cardiopulmonary bypass was 120 minutes.     Kerin Perna, M.D.     PV/MEDQ  D:  10/29/2011  T:  10/30/2011  Job:  [id number]  cc:   Jake Bathe, MD

## 2011-10-30 NOTE — Progress Notes (Signed)
Postop day one CABG x4 Preoperative obesity COPD diabetes deconditioning Hemodynamic stable weaned off drips Will DC lines mobilize out of bed and start Lasix diuresis Lantus insulin order to transition off IV insulin drip

## 2011-10-31 ENCOUNTER — Inpatient Hospital Stay (HOSPITAL_COMMUNITY): Payer: Medicare Other

## 2011-10-31 LAB — BASIC METABOLIC PANEL
BUN: 25 mg/dL — ABNORMAL HIGH (ref 6–23)
CO2: 27 mEq/L (ref 19–32)
Calcium: 8.6 mg/dL (ref 8.4–10.5)
Chloride: 102 mEq/L (ref 96–112)
Creatinine, Ser: 1.35 mg/dL (ref 0.50–1.35)
GFR calc Af Amer: 56 mL/min — ABNORMAL LOW (ref >90)
GFR calc non Af Amer: 48 mL/min — ABNORMAL LOW (ref >90)
Glucose, Bld: 154 mg/dL — ABNORMAL HIGH (ref 70–99)
Potassium: 4.1 mEq/L (ref 3.5–5.1)
Sodium: 138 mEq/L (ref 135–145)

## 2011-10-31 LAB — CBC
HCT: 32.2 % — ABNORMAL LOW (ref 39.0–52.0)
Hemoglobin: 10.9 g/dL — ABNORMAL LOW (ref 13.0–17.0)
MCH: 31.9 pg (ref 26.0–34.0)
MCHC: 33.9 g/dL (ref 30.0–36.0)
MCV: 94.2 fL (ref 78.0–100.0)
Platelets: 102 10*3/uL — ABNORMAL LOW (ref 150–400)
RBC: 3.42 MIL/uL — ABNORMAL LOW (ref 4.22–5.81)
RDW: 13.8 % (ref 11.5–15.5)
WBC: 12.6 10*3/uL — ABNORMAL HIGH (ref 4.0–10.5)

## 2011-10-31 LAB — GLUCOSE, CAPILLARY
Glucose-Capillary: 138 mg/dL — ABNORMAL HIGH (ref 70–99)
Glucose-Capillary: 144 mg/dL — ABNORMAL HIGH (ref 70–99)
Glucose-Capillary: 147 mg/dL — ABNORMAL HIGH (ref 70–99)
Glucose-Capillary: 159 mg/dL — ABNORMAL HIGH (ref 70–99)
Glucose-Capillary: 231 mg/dL — ABNORMAL HIGH (ref 70–99)
Glucose-Capillary: 89 mg/dL (ref 70–99)

## 2011-10-31 NOTE — Progress Notes (Signed)
Patient ID: Cody Thomas, male   DOB: 1930-05-03, 78 y.o.   MRN: [id number]  SICU evening rounds:  Hemodynamically stable Diuresing well Glucose still a little elevated. On Lantus 14 bid Ambulating well.

## 2011-10-31 NOTE — Progress Notes (Signed)
2 Days Post-Op Procedure(s) (LRB): CORONARY ARTERY BYPASS GRAFTING (CABG) (N/A) Subjective: No complaints  Objective: Vital signs in last 24 hours: Temp:  [98.8 F (37.1 C)-99.1 F (37.3 C)] 98.9 F (37.2 C) (08/31 0800) Pulse Rate:  [39-89] 89  (08/31 1000) Cardiac Rhythm:  [-] Atrial fibrillation (08/31 0800) Resp:  [11-21] 20  (08/31 1000) BP: (87-146)/(49-77) 146/77 mmHg (08/31 1000) SpO2:  [90 %-98 %] 94 % (08/31 1000) Weight:  [92.2 kg (203 lb 4.2 oz)] 92.2 kg (203 lb 4.2 oz) (08/31 0600)  Hemodynamic parameters for last 24 hours:    Intake/Output from previous day: 08/30 0701 - 08/31 0700 In: 1780.7 [P.O.:800; I.V.:472.7; IV Piggyback:508] Out: 2035 [Urine:1565; Chest Tube:470] Intake/Output this shift: Total I/O In: 504 [P.O.:240; I.V.:60; IV Piggyback:204] Out: 880 [Urine:840; Chest Tube:40]  General appearance: alert and cooperative Heart: irregularly irregular rhythm Lungs: clear to auscultation bilaterally Extremities: edema mild Wound: dressing dry  Lab Results:  Basename 10/31/11 0400 10/30/11 1700  WBC 12.6* 12.4*  HGB 10.9* 11.1*  HCT 32.2* 32.3*  PLT 102* 104*   BMET:  Basename 10/31/11 0400 10/30/11 1700 10/30/11 1652 10/30/11 0405  NA 138 -- 139 --  K 4.1 -- 4.2 --  CL 102 -- 103 --  CO2 27 -- -- 26  GLUCOSE 154* -- 145* --  BUN 25* -- 21 --  CREATININE 1.35 1.39* -- --  CALCIUM 8.6 -- -- 8.9    PT/INR:  Basename 10/29/11 1400  LABPROT 17.2*  INR 1.38   ABG    Component Value Date/Time   PHART 7.303* 10/29/2011 2009   HCO3 23.6 10/29/2011 2009   TCO2 23 10/30/2011 1652   ACIDBASEDEF 3.0* 10/29/2011 2009   O2SAT 94.0 10/29/2011 2009   CBG (last 3)   Basename 10/31/11 0819 10/31/11 0410 10/31/11 0035  GLUCAP 138* 147* 89   CXR:  Slight elevation of left HD with left lung atelectasis  Assessment/Plan: S/P Procedure(s) (LRB): CORONARY ARTERY BYPASS GRAFTING (CABG) (N/A) Postop rate-controlled A-fib persists. Continue amio and  lopressor. DC chest tube and sleeve. Mobilize Diuresis Diabetes control Continue foley due to diuresing patient and urinary output monitoring   LOS: 2 days    Cody Thomas K 10/31/2011

## 2011-11-01 LAB — GLUCOSE, CAPILLARY
Glucose-Capillary: 77 mg/dL (ref 70–99)
Glucose-Capillary: 94 mg/dL (ref 70–99)
Glucose-Capillary: 96 mg/dL (ref 70–99)
Glucose-Capillary: 154 mg/dL — ABNORMAL HIGH (ref 70–99)
Glucose-Capillary: 167 mg/dL — ABNORMAL HIGH (ref 70–99)
Glucose-Capillary: 95 mg/dL (ref 70–99)

## 2011-11-01 LAB — BASIC METABOLIC PANEL
BUN: 30 mg/dL — ABNORMAL HIGH (ref 6–23)
CO2: 32 meq/L (ref 19–32)
Calcium: 8.5 mg/dL (ref 8.4–10.5)
Chloride: 98 mEq/L (ref 96–112)
Creatinine, Ser: 1.5 mg/dL — ABNORMAL HIGH (ref 0.50–1.35)
GFR calc Af Amer: 49 mL/min — ABNORMAL LOW (ref >90)
GFR calc non Af Amer: 43 mL/min — ABNORMAL LOW (ref >90)
Glucose, Bld: 105 mg/dL — ABNORMAL HIGH (ref 70–99)
Potassium: 3.5 meq/L (ref 3.5–5.1)
Sodium: 136 meq/L (ref 135–145)

## 2011-11-01 MED ORDER — MOVING RIGHT ALONG BOOK
Freq: Once | Status: AC
Start: 2011-11-01 — End: 2011-11-01
  Administered 2011-11-01: 13:00:00
  Filled 2011-11-01: qty 1

## 2011-11-01 MED ORDER — SODIUM CHLORIDE 0.9 % IV SOLN: 250.0000 mL | INTRAVENOUS | Status: DC | PRN

## 2011-11-01 MED ORDER — ASPIRIN EC 81 MG PO TBEC
81.0000 mg | DELAYED_RELEASE_TABLET | Freq: Every day | ORAL | Status: DC
  Administered 2011-11-02 – 2011-11-03 (×2): 81 mg via ORAL
  Filled 2011-11-01 (×2): qty 1

## 2011-11-01 MED ORDER — SODIUM CHLORIDE 0.9 % IJ SOLN
3.0000 mL | Freq: Two times a day (BID) | INTRAMUSCULAR | Status: DC
  Administered 2011-11-01 – 2011-11-03 (×4): 3 mL via INTRAVENOUS

## 2011-11-01 MED ORDER — BISACODYL 5 MG PO TBEC: 10.0000 mg | DELAYED_RELEASE_TABLET | Freq: Every day | ORAL | Status: DC | PRN

## 2011-11-01 MED ORDER — PANTOPRAZOLE SODIUM 40 MG PO TBEC
40.0000 mg | DELAYED_RELEASE_TABLET | Freq: Every day | ORAL | Status: DC
  Administered 2011-11-01 – 2011-11-03 (×3): 40 mg via ORAL
  Filled 2011-11-01 (×3): qty 1

## 2011-11-01 MED ORDER — DOCUSATE SODIUM 100 MG PO CAPS: 200.0000 mg | ORAL_CAPSULE | Freq: Every day | ORAL | Status: DC

## 2011-11-01 MED ORDER — WARFARIN VIDEO
Freq: Once | Status: AC
  Administered 2011-11-01: 17:00:00

## 2011-11-01 MED ORDER — TRAMADOL HCL 50 MG PO TABS
50.0000 mg | ORAL_TABLET | ORAL | Status: DC | PRN
  Administered 2011-11-02: 50 mg via ORAL
  Filled 2011-11-01: qty 1

## 2011-11-01 MED ORDER — OXYCODONE HCL 5 MG PO TABS
5.0000 mg | ORAL_TABLET | ORAL | Status: DC | PRN
  Administered 2011-11-01 – 2011-11-03 (×3): 5 mg via ORAL
  Filled 2011-11-01 (×2): qty 1
  Filled 2011-11-01: qty 2

## 2011-11-01 MED ORDER — METOPROLOL TARTRATE 25 MG PO TABS
25.0000 mg | ORAL_TABLET | Freq: Two times a day (BID) | ORAL | Status: DC
  Administered 2011-11-01 – 2011-11-03 (×5): 25 mg via ORAL
  Filled 2011-11-01 (×7): qty 1

## 2011-11-01 MED ORDER — ACETAMINOPHEN 325 MG PO TABS: 650.0000 mg | ORAL_TABLET | Freq: Four times a day (QID) | ORAL | Status: DC | PRN

## 2011-11-01 MED ORDER — COUMADIN BOOK
Freq: Once | Status: AC
  Administered 2011-11-01: 16:00:00
  Filled 2011-11-01: qty 1

## 2011-11-01 MED ORDER — ONDANSETRON HCL 4 MG PO TABS: 4.0000 mg | ORAL_TABLET | Freq: Four times a day (QID) | ORAL | Status: DC | PRN

## 2011-11-01 MED ORDER — INSULIN ASPART 100 UNIT/ML ~~LOC~~ SOLN
0.0000 [IU] | Freq: Three times a day (TID) | SUBCUTANEOUS | Status: DC
  Administered 2011-11-01: 4 [IU] via SUBCUTANEOUS
  Administered 2011-11-02 (×2): 2 [IU] via SUBCUTANEOUS
  Administered 2011-11-02: 8 [IU] via SUBCUTANEOUS
  Administered 2011-11-03: 4 [IU] via SUBCUTANEOUS
  Administered 2011-11-03: 2 [IU] via SUBCUTANEOUS
  Administered 2011-11-03: 4 [IU] via SUBCUTANEOUS
  Administered 2011-11-04: 2 [IU] via SUBCUTANEOUS

## 2011-11-01 MED ORDER — WARFARIN - PHYSICIAN DOSING INPATIENT: Freq: Every day | Status: DC

## 2011-11-01 MED ORDER — ONDANSETRON HCL 4 MG/2ML IJ SOLN: 4.0000 mg | Freq: Four times a day (QID) | INTRAMUSCULAR | Status: DC | PRN

## 2011-11-01 MED ORDER — SODIUM CHLORIDE 0.9 % IJ SOLN: 3.0000 mL | INTRAMUSCULAR | Status: DC | PRN

## 2011-11-01 MED ORDER — WARFARIN SODIUM 2.5 MG PO TABS
2.5000 mg | ORAL_TABLET | Freq: Every day | ORAL | Status: AC
  Administered 2011-11-01: 2.5 mg via ORAL
  Filled 2011-11-01: qty 1

## 2011-11-01 MED ORDER — BISACODYL 10 MG RE SUPP: 10.0000 mg | Freq: Every day | RECTAL | Status: DC | PRN

## 2011-11-01 MED ORDER — DOCUSATE SODIUM 100 MG PO CAPS
200.0000 mg | ORAL_CAPSULE | Freq: Every day | ORAL | Status: DC
  Administered 2011-11-02 – 2011-11-03 (×2): 200 mg via ORAL
  Filled 2011-11-01 (×3): qty 2

## 2011-11-01 MED ORDER — POTASSIUM CHLORIDE CRYS ER 20 MEQ PO TBCR
40.0000 meq | EXTENDED_RELEASE_TABLET | Freq: Once | ORAL | Status: AC
  Administered 2011-11-01: 40 meq via ORAL
  Filled 2011-11-01: qty 2

## 2011-11-01 NOTE — Progress Notes (Signed)
3 Days Post-Op Procedure(s) (LRB): CORONARY ARTERY BYPASS GRAFTING (CABG) (N/A) Subjective: No complaints  Objective: Vital signs in last 24 hours: Temp:  [97.8 F (36.6 C)-98.7 F (37.1 C)] 98 F (36.7 C) (09/01 0800) Pulse Rate:  [75-96] 88  (09/01 0800) Cardiac Rhythm:  [-] Atrial fibrillation (09/01 0800) Resp:  [16-22] 16  (09/01 0800) BP: (85-146)/(52-77) 121/74 mmHg (09/01 0800) SpO2:  [93 %-96 %] 93 % (09/01 0800) Weight:  [89.4 kg (197 lb 1.5 oz)] 89.4 kg (197 lb 1.5 oz) (09/01 0500)  Hemodynamic parameters for last 24 hours:    Intake/Output from previous day: 08/31 0701 - 09/01 0700 In: 932 [P.O.:600; I.V.:120; IV Piggyback:212] Out: 3086 [VHQIO:9629; Chest Tube:60] Intake/Output this shift: Total I/O In: -  Out: 100 [Urine:100]  General appearance: alert and cooperative Heart: irregularly irregular rhythm Lungs: clear to auscultation bilaterally Extremities: extremities normal, atraumatic, no cyanosis or edema Wound: incision ok  Lab Results:  Basename 10/31/11 0400 10/30/11 1700  WBC 12.6* 12.4*  HGB 10.9* 11.1*  HCT 32.2* 32.3*  PLT 102* 104*   BMET:  Basename 11/01/11 0430 10/31/11 0400  NA 136 138  K 3.5 4.1  CL 98 102  CO2 32 27  GLUCOSE 105* 154*  BUN 30* 25*  CREATININE 1.50* 1.35  CALCIUM 8.5 8.6    PT/INR:  Basename 10/29/11 1400  LABPROT 17.2*  INR 1.38   ABG    Component Value Date/Time   PHART 7.303* 10/29/2011 2009   HCO3 23.6 10/29/2011 2009   TCO2 23 10/30/2011 1652   ACIDBASEDEF 3.0* 10/29/2011 2009   O2SAT 94.0 10/29/2011 2009   CBG (last 3)   Basename 11/01/11 0337 10/31/11 2305 10/31/11 1934  GLUCAP 94 77 231*    Assessment/Plan: S/P Procedure(s) (LRB): CORONARY ARTERY BYPASS GRAFTING (CABG) (N/A) Postop A-fib rate-controlled on amio and lopressor. Will start coumadin slowly in case a-fib persists Mobilize Diabetes control: Glucose under adequate control. Hgb A1c was 8.2 preop. Continue Lantus. He may need  another oral agent or insulin longer term. Creatinine up slightly. follow Plan for transfer to step-down: see transfer orders   LOS: 3 days    Wataru Mccowen K 11/01/2011

## 2011-11-01 NOTE — Progress Notes (Signed)
Pt converted to SR at 16:08 today. Will continue to monitor. Cody Cody

## 2011-11-01 NOTE — Progress Notes (Signed)
Pt ambulated in hallway without rolling walker.  Standby assist is needed due to side leaning x 2.  Ambulated 150 feet without any s/sx distress. No oxygen needed. Thomas Hoff

## 2011-11-02 LAB — CBC
HCT: 29.2 % — ABNORMAL LOW (ref 39.0–52.0)
Hemoglobin: 9.9 g/dL — ABNORMAL LOW (ref 13.0–17.0)
MCH: 31.3 pg (ref 26.0–34.0)
MCHC: 33.9 g/dL (ref 30.0–36.0)
MCV: 92.4 fL (ref 78.0–100.0)
Platelets: 161 10*3/uL (ref 150–400)
RBC: 3.16 MIL/uL — ABNORMAL LOW (ref 4.22–5.81)
RDW: 13.1 % (ref 11.5–15.5)
WBC: 8.5 10*3/uL (ref 4.0–10.5)

## 2011-11-02 LAB — GLUCOSE, CAPILLARY
Glucose-Capillary: 113 mg/dL — ABNORMAL HIGH (ref 70–99)
Glucose-Capillary: 211 mg/dL — ABNORMAL HIGH (ref 70–99)
Glucose-Capillary: 145 mg/dL — ABNORMAL HIGH (ref 70–99)
Glucose-Capillary: 134 mg/dL — ABNORMAL HIGH (ref 70–99)

## 2011-11-02 LAB — BASIC METABOLIC PANEL
BUN: 30 mg/dL — ABNORMAL HIGH (ref 6–23)
CO2: 29 meq/L (ref 19–32)
Calcium: 8.5 mg/dL (ref 8.4–10.5)
Chloride: 99 mEq/L (ref 96–112)
Creatinine, Ser: 1.33 mg/dL (ref 0.50–1.35)
GFR calc Af Amer: 57 mL/min — ABNORMAL LOW (ref >90)
GFR calc non Af Amer: 49 mL/min — ABNORMAL LOW (ref >90)
Glucose, Bld: 123 mg/dL — ABNORMAL HIGH (ref 70–99)
Potassium: 3.7 mEq/L (ref 3.5–5.1)
Sodium: 136 mEq/L (ref 135–145)

## 2011-11-02 LAB — PROTIME-INR
INR: 1.19 (ref 0.00–1.49)
Prothrombin Time: 15.4 s — ABNORMAL HIGH (ref 11.6–15.2)

## 2011-11-02 MED ORDER — LINAGLIPTIN 5 MG PO TABS
5.0000 mg | ORAL_TABLET | Freq: Every day | ORAL | Status: DC
  Administered 2011-11-02 – 2011-11-03 (×2): 5 mg via ORAL
  Filled 2011-11-02 (×3): qty 1

## 2011-11-02 MED ORDER — FUROSEMIDE 20 MG PO TABS
20.0000 mg | ORAL_TABLET | Freq: Two times a day (BID) | ORAL | Status: DC
  Administered 2011-11-02 – 2011-11-03 (×3): 20 mg via ORAL
  Filled 2011-11-02 (×5): qty 1

## 2011-11-02 MED ORDER — WARFARIN SODIUM 2.5 MG PO TABS
2.5000 mg | ORAL_TABLET | Freq: Every day | ORAL | Status: DC
  Filled 2011-11-02: qty 1

## 2011-11-02 MED FILL — Lidocaine HCl IV Inj 20 MG/ML: INTRAVENOUS | Qty: 5 | Status: AC

## 2011-11-02 MED FILL — Electrolyte-R (PH 7.4) Solution: INTRAVENOUS | Qty: 5000 | Status: AC

## 2011-11-02 MED FILL — Sodium Chloride IV Soln 0.9%: INTRAVENOUS | Qty: 1000 | Status: AC

## 2011-11-02 MED FILL — Sodium Chloride Irrigation Soln 0.9%: Qty: 3000 | Status: AC

## 2011-11-02 MED FILL — Heparin Sodium (Porcine) Inj 1000 Unit/ML: INTRAMUSCULAR | Qty: 30 | Status: AC

## 2011-11-02 MED FILL — Heparin Sodium (Porcine) Inj 1000 Unit/ML: INTRAMUSCULAR | Qty: 10 | Status: AC

## 2011-11-02 MED FILL — Sodium Bicarbonate IV Soln 8.4%: INTRAVENOUS | Qty: 50 | Status: AC

## 2011-11-02 MED FILL — Mannitol IV Soln 20%: INTRAVENOUS | Qty: 500 | Status: AC

## 2011-11-02 NOTE — Progress Notes (Signed)
Ambulated with pt on room air (sats 95%). Standby assistance. Pt tolerated well. Ambulated approx. 600 feet.

## 2011-11-02 NOTE — Progress Notes (Addendum)
4 Days Post-Op Procedure(s) (LRB): CORONARY ARTERY BYPASS GRAFTING (CABG) (N/A)  Subjective: Cody Thomas has complains of swelling and bruising in his R ankle.  He denies chest pain and shortness of breath.  + ambulation  Objective: Vital signs in last 24 hours: Temp:  [98.2 F (36.8 C)-98.8 F (37.1 C)] 98.8 F (37.1 C) (09/02 0448) Pulse Rate:  [77-97] 89  (09/02 0448) Cardiac Rhythm:  [-] Normal sinus rhythm;Heart block (09/01 2037) Resp:  [18-21] 20  (09/02 0448) BP: (111-142)/(61-74) 126/70 mmHg (09/02 0448) SpO2:  [92 %-98 %] 92 % (09/02 0448) Weight:  [199 lb 11.2 oz (90.583 kg)] 199 lb 11.2 oz (90.583 kg) (09/02 0448)  Intake/Output from previous day: 09/01 0701 - 09/02 0700 In: 486 [P.O.:480; I.V.:6] Out: 890 [Urine:890]  General appearance: alert, cooperative and no distress Heart: regular rate and rhythm Lungs: clear to auscultation bilaterally Abdomen: soft, non-tender; bowel sounds normal; no masses,  no organomegaly Extremities: edema trace, ecchymosis with +blood pooling in right ankle Wound: clean and dry  Lab Results:  Basename 11/02/11 0540 10/31/11 0400  WBC 8.5 12.6*  HGB 9.9* 10.9*  HCT 29.2* 32.2*  PLT 161 102*   BMET:  Basename 11/02/11 0540 11/01/11 0430  NA 136 136  K 3.7 3.5  CL 99 98  CO2 29 32  GLUCOSE 123* 105*  BUN 30* 30*  CREATININE 1.33 1.50*  CALCIUM 8.5 8.5    PT/INR:  Basename 11/02/11 0540  LABPROT 15.4*  INR 1.19   ABG    Component Value Date/Time   PHART 7.303* 10/29/2011 2009   HCO3 23.6 10/29/2011 2009   TCO2 23 10/30/2011 1652   ACIDBASEDEF 3.0* 10/29/2011 2009   O2SAT 94.0 10/29/2011 2009   CBG (last 3)   Basename 11/02/11 0600 11/01/11 2023 11/01/11 1609  GLUCAP 113* 95 167*    Assessment/Plan: S/P Procedure(s) (LRB): CORONARY ARTERY BYPASS GRAFTING (CABG) (N/A)  1. CV- Previous Atrial Fibrillation, currently NSR will 1st degree block, on lopressor and Amiodarone 2. Pulm- no acute issues 3. INR 1.19-  will give 2.5mg  coumadin tonight 4. DM- CBGs moderately controlled, will restart home Januvia 5. RLE edema/ecchymosis- apply TED Hose 6. Volume  Status-patients weight is above preop by 7lbs, continue Lasix 7. Dispo- patient doing better, currently maintaining NSR, can d/c EPW if remains NSR, on coumadin for now, can possibly d/c home Wednesday    LOS: 4 days    Cody Thomas 11/02/2011    Chart reviewed, patient examined, agree with above. He is back in sinus rhythm. Will hold off on coumadin.

## 2011-11-03 LAB — GLUCOSE, CAPILLARY
Glucose-Capillary: 142 mg/dL — ABNORMAL HIGH (ref 70–99)
Glucose-Capillary: 171 mg/dL — ABNORMAL HIGH (ref 70–99)
Glucose-Capillary: 121 mg/dL — ABNORMAL HIGH (ref 70–99)
Glucose-Capillary: 167 mg/dL — ABNORMAL HIGH (ref 70–99)

## 2011-11-03 LAB — PROTIME-INR
INR: 1.1 (ref 0.00–1.49)
Prothrombin Time: 14.4 seconds (ref 11.6–15.2)

## 2011-11-03 MED ORDER — LIVING WELL WITH DIABETES BOOK
Freq: Once | Status: AC
  Administered 2011-11-03: 14:00:00
  Filled 2011-11-03 (×2): qty 1

## 2011-11-03 MED ORDER — POTASSIUM CHLORIDE CRYS ER 20 MEQ PO TBCR
20.0000 meq | EXTENDED_RELEASE_TABLET | Freq: Every day | ORAL | Status: DC
  Administered 2011-11-03: 20 meq via ORAL
  Filled 2011-11-03 (×2): qty 1

## 2011-11-03 MED ORDER — METOPROLOL TARTRATE 25 MG PO TABS: 25.0000 mg | ORAL_TABLET | Freq: Two times a day (BID) | ORAL | Status: DC

## 2011-11-03 MED ORDER — OXYCODONE HCL 5 MG PO TABS: 5.0000 mg | ORAL_TABLET | ORAL | Status: DC | PRN

## 2011-11-03 MED ORDER — WARFARIN SODIUM 5 MG PO TABS
5.0000 mg | ORAL_TABLET | Freq: Once | ORAL | Status: DC
  Filled 2011-11-03: qty 1

## 2011-11-03 MED ORDER — ASPIRIN 325 MG PO TABS: 325.0000 mg | ORAL_TABLET | Freq: Every day | ORAL | Status: DC

## 2011-11-03 MED ORDER — WARFARIN - PHYSICIAN DOSING INPATIENT: Freq: Every day | Status: DC

## 2011-11-03 MED ORDER — ASPIRIN 325 MG PO TBEC: 325.0000 mg | DELAYED_RELEASE_TABLET | Freq: Every day | ORAL | Status: AC

## 2011-11-03 MED ORDER — TRAMADOL HCL 50 MG PO TABS: 50.0000 mg | ORAL_TABLET | ORAL | Status: DC | PRN

## 2011-11-03 MED ORDER — ASPIRIN EC 325 MG PO TBEC
325.0000 mg | DELAYED_RELEASE_TABLET | Freq: Every day | ORAL | Status: DC
  Filled 2011-11-03 (×2): qty 1

## 2011-11-03 MED ORDER — ASPIRIN 81 MG PO TBEC: 325.0000 mg | DELAYED_RELEASE_TABLET | Freq: Every day | ORAL | Status: DC

## 2011-11-03 MED ORDER — FUROSEMIDE 40 MG PO TABS
40.0000 mg | ORAL_TABLET | Freq: Every day | ORAL | Status: DC
  Filled 2011-11-03: qty 1

## 2011-11-03 MED ORDER — TRAMADOL HCL 50 MG PO TABS: 50.0000 mg | ORAL_TABLET | ORAL | Status: AC | PRN

## 2011-11-03 MED ORDER — WARFARIN SODIUM 5 MG PO TABS: 5.0000 mg | ORAL_TABLET | Freq: Every day | ORAL | Status: DC

## 2011-11-03 MED ORDER — AMIODARONE HCL 200 MG PO TABS
200.0000 mg | ORAL_TABLET | Freq: Two times a day (BID) | ORAL | Status: DC
  Administered 2011-11-03: 200 mg via ORAL
  Filled 2011-11-03 (×3): qty 1

## 2011-11-03 MED ORDER — AMIODARONE HCL 400 MG PO TABS: 400.0000 mg | ORAL_TABLET | Freq: Two times a day (BID) | ORAL | Status: DC

## 2011-11-03 MED ORDER — AMIODARONE HCL 200 MG PO TABS: 200.0000 mg | ORAL_TABLET | Freq: Two times a day (BID) | ORAL | Status: DC

## 2011-11-03 MED ORDER — FUROSEMIDE 40 MG PO TABS: 40.0000 mg | ORAL_TABLET | Freq: Every day | ORAL | Status: DC

## 2011-11-03 MED ORDER — POTASSIUM CHLORIDE CRYS ER 20 MEQ PO TBCR: 20.0000 meq | EXTENDED_RELEASE_TABLET | Freq: Every day | ORAL | Status: DC

## 2011-11-03 NOTE — Discharge Instructions (Signed)
Endoscopic Saphenous Vein Harvesting Care After Refer to this sheet in the next few weeks. These instructions provide you with information on caring for yourself after your procedure. Your caregiver may also give you more specific instructions. Your treatment has been planned according to current medical practices, but problems sometimes occur. Call your caregiver if you have any problems or questions after your procedure. HOME CARE INSTRUCTIONS Medicine  Take whatever pain medicine your surgeon prescribes. Follow the directions carefully. Do not take over-the-counter pain medicine unless your surgeon says it is okay. Some pain medicine can cause bleeding problems for several weeks after surgery.   Follow your surgeon's instructions about driving. You will probably not be permitted to drive after heart surgery.   Take any medicines your surgeon prescribes. Any medicines you took before your heart surgery should be checked with your caregiver before you start taking them again.  Wound care  Ask your surgeon how long you should keep wearing your elastic bandage or stocking.   Check the area around your surgical cuts (incisions) whenever your bandages (dressings) are changed. Look for any redness or swelling.   You will need to return to have the stitches (sutures) or staples taken out. Ask your surgeon when to do that.   Ask your surgeon when you can shower or bathe.  Activity  Try to keep your legs raised when you are sitting.   Do any exercises your caregivers have given you. These may include deep breathing exercises, coughing, walking, or other exercises.  SEEK MEDICAL CARE IF:  You have any questions about your medicines.   You have more leg pain, especially if your pain medicine stops working.   New or growing bruises develop on your leg.   Your leg swells, feels tight, or becomes red.   You have numbness in your leg.  SEEK IMMEDIATE MEDICAL CARE IF:  Your pain gets much  worse.   Blood or fluid leaks from any of the incisions.   Your incisions become warm, swollen, or red.   You have chest pain.   You have trouble breathing.   You have a fever.   You have more pain near your leg incision.  MAKE SURE YOU:  Understand these instructions.   Will watch your condition.   Will get help right away if you are not doing well or get worse.  Document Released: 10/29/2010 Document Revised: 02/05/2011 Document Reviewed: 10/29/2010 [facility] Patient Information 2012 [place], [place].Warfarin  Warfarin is a blood thinner (anticoagulant) medicine. It is used to keep clots from forming in your blood. When you take warfarin, you may bruise easily. You may also bleed a little longer if you cut yourself.  Before taking warfarin, tell your doctor if:  You take any other medicine for your heart or blood pressure.   You are pregnant.   You plan to get pregnant.   You are breastfeeding.   You are allergic to any medicine.  HOME CARE  Take warfarin as told by your doctor. Do not stop the medicine unless your doctor tells you to.   Take your medicine at the same time every day.   Do not take anything that has aspirin in it unless your doctor says it is okay.   Do not drink alcohol.   Tell all your doctors and dentists that you are taking warfarin before they treat you or give you any medicines.   Keep all your appointments for doctor visits and blood tests.   Keep warfarin  out of reach of children. Do not share warfarin with anyone.   Eat about the same amount of vitamin K foods every day.   High vitamin K foods: Beef liver. Pork liver. Green tea. Broccoli. Brussels sprouts. Cauliflower. Chickpeas. Kale. Spinach. Turnip greens.   Medium vitamin K foods: Chicken liver. Pork tenderloin. Cheddar cheese. Rolled oats. Coffee. Asparagus. Cabbage. Iceberg lettuce.   Low vitamin K foods: Apples. Butter. Bananas. Skim or 1% milk. Orange rice. Canned pears. White  bread. Strawberries. Corn. Tomatoes. Green beans. Eggs. Potatoes. Tomasa Blase. Pumpkin. Chicken breasts. Ground beef. Oil (except soybean oil).  GET HELP RIGHT AWAY IF:  You miss a dose of warfarin. Do not take 2 doses at the same time.   You have a skin rash.   You have heavy or unusual bleeding.   There is blood in your pee (urine) or poop (stool).   You have side effects from medicine that do not get better after a few days.  MAKE SURE YOU:  Understand these instructions.   Will watch your condition.   Will get help right away if you are not doing well or get worse.  Document Released: 03/21/2010 Document Revised: 02/05/2011 Document Reviewed: 03/21/2010 [facility] Patient Information 2012 [place], [place].Coronary Artery Bypass Grafting Care After Refer to this sheet in the next few weeks. These instructions provide you with information on caring for yourself after your procedure. Your caregiver may also give you more specific instructions. Your treatment has been planned according to current medical practices, but problems sometimes occur. Call your caregiver if you have any problems or questions after your procedure.  Recovery from open heart surgery will be different for everyone. Some people feel well after 3 or 4 weeks, while for others it takes longer. After heart surgery, it may be normal to:  Not have an appetite, feel nauseated by the smell of food, or only want to eat a small amount.   Be constipated because of changes in your diet, activity, and medicines. Eat foods high in fiber. Add fresh fruits and vegetables to your diet. Stool softeners may be helpful.   Feel sad or unhappy. You may be frustrated or cranky. You may have good days and bad days. Do not give up. Talk to your caregiver if you do not feel better.   Feel weakness and fatigue. You many need physical therapy or cardiac rehabilitation to get your strength back.   Develop an irregular heartbeat called atrial  fibrillation. Symptoms of atrial fibrillation are a fast, irregular heartbeat or feelings of fluttery heartbeats, shortness of breath, low blood pressure, and dizziness. If these symptoms develop, see your caregiver right away.  MEDICATION  Have a list of all the medicines you will be taking when you leave the hospital. For every medicine, know the following:   Name.   Exact dose.   Time of day to be taken.   How often it should be taken.   Why you are taking it.   Ask which medicines should or should not be taken together. If you take more than one heart medicine, ask if it is okay to take them together. Some heart medicines should not be taken at the same time because they may lower your blood pressure too much.   Narcotic pain medicine can cause constipation. Eat fresh fruits and vegetables. Add fiber to your diet. Stool softener medicine may help relieve constipation.   Keep a copy of your medicines with you at all times.   Do  not add or stop taking any medicine until you check with your caregiver.   Medicines can have side effects. Call your caregiver who prescribed the medicine if you:   Start throwing up, have diarrhea, or have stomach pain.   Feel dizzy or lightheaded when you stand up.   Feel your heart is skipping beats or is beating too fast or too slow.   Develop a rash.   Notice unusual bruising or bleeding.  HOME CARE INSTRUCTIONS  After heart surgery, it is important to learn how to take your pulse. Have your caregiver show you how to take your pulse.   Use your incentive spirometer. Ask your caregiver how long after surgery you need to use it.  Care of your chest incision  Tell your caregiver right away if you notice clicking in your chest (sternum).   Support your chest with a pillow or your arms when you take deep breaths and cough.   Follow your caregiver's instructions about when you can bathe or swim.   Protect your incision from sunlight during the  first year to keep the scar from getting dark.   Tell your caregiver if you notice:   Increased tenderness of your incision.   Increased redness or swelling around your incision.   Drainage or pus from your incision.  Care of your leg incision(s)  Avoid crossing your legs.   Avoid sitting for long periods of time. Change positions every half hour.   Elevate your leg(s) when you are sitting.   Check your leg(s) daily for swelling. Check the incisions for redness or drainage.   Wear your elastic stockings as told by your caregiver. Take them off at bedtime.  Diet  Diet is very important to heart health.   Eat plenty of fresh fruits and vegetables. Meats should be lean cut. Avoid canned, processed, and fried foods.   Talk to a dietician. They can teach you how to make healthy food and drink choices.  Weight  Weigh yourself every day. This is important because it helps to know if you are retaining fluid that may make your heart and lungs work harder.   Use the same scale each time.   Weigh yourself every morning at the same time. You should do this after you go to the bathroom, but before you eat breakfast.   Your weight will be more accurate if you do not wear any clothes.   Record your weight.   Tell your caregiver if you have gained 2 pounds or more overnight.  Activity Stop any activity at once if you have chest pain, shortness of breath, irregular heartbeats, or dizziness. Get help right away if you have any of these symptoms.  Bathing.  Avoid soaking in a bath or hot tub until your incisions are healed.   Rest. You need a balance of rest and activity.   Exercise. Exercise per your caregiver's advice. You may need physical therapy or cardiac rehabilitation to help strengthen your muscles and build your endurance.   Climbing stairs. Unless your caregiver tells you not to climb stairs, go up stairs slowly and rest if you tire. Do not pull yourself up by the handrail.     Driving a car. Follow your caregiver's advice on when you may drive. You may ride as a passenger at any time. When traveling for long periods of time in a car, get out of the car and walk around for a few minutes every 2 hours.   Lifting.  Avoid lifting, pushing, or pulling anything heavier than 10 pounds for 6 weeks after surgery or as told by your caregiver.   Returning to work. Check with your caregiver. People heal at different rates. Most people will be able to go back to work 6 to 12 weeks after surgery.   Sexual activity. You may resume sexual relations as told by your caregiver.  SEEK MEDICAL CARE IF:  Any of your incisions are red, painful, or have any type of drainage coming from them.   You have an oral temperature above 102 F (38.9 C).   You have ankle or leg swelling.   You have pain in your legs.   You have weight gain of 2 or more pounds a day.   You feel dizzy or lightheaded when you stand up.  SEEK IMMEDIATE MEDICAL CARE IF:  You have angina or chest pain that goes to your jaw or arms. Call your local emergency services right away.   You have shortness of breath at rest or with activity.   You have a fast or irregular heartbeat (arrhythmia).   There is a "clicking" in your sternum when you move.   You have numbness or weakness in your arms or legs.  MAKE SURE YOU:  Understand these instructions.   Will watch your condition.   Will get help right away if you are not doing well or get worse.  Document Released: 09/05/2004 Document Revised: 02/05/2011 Document Reviewed: 04/23/2010 [facility] Patient Information 2012 [place], [place].Endoscopic Saphenous Vein Harvesting Care After Refer to this sheet in the next few weeks. These instructions provide you with information on caring for yourself after your procedure. Your caregiver may also give you more specific instructions. Your treatment has been planned according to current medical practices, but problems  sometimes occur. Call your caregiver if you have any problems or questions after your procedure. HOME CARE INSTRUCTIONS Medicine  Take whatever pain medicine your surgeon prescribes. Follow the directions carefully. Do not take over-the-counter pain medicine unless your surgeon says it is okay. Some pain medicine can cause bleeding problems for several weeks after surgery.   Follow your surgeon's instructions about driving. You will probably not be permitted to drive after heart surgery.   Take any medicines your surgeon prescribes. Any medicines you took before your heart surgery should be checked with your caregiver before you start taking them again.  Wound care  Ask your surgeon how long you should keep wearing your elastic bandage or stocking.   Check the area around your surgical cuts (incisions) whenever your bandages (dressings) are changed. Look for any redness or swelling.   You will need to return to have the stitches (sutures) or staples taken out. Ask your surgeon when to do that.   Ask your surgeon when you can shower or bathe.  Activity  Try to keep your legs raised when you are sitting.   Do any exercises your caregivers have given you. These may include deep breathing exercises, coughing, walking, or other exercises.  SEEK MEDICAL CARE IF:  You have any questions about your medicines.   You have more leg pain, especially if your pain medicine stops working.   New or growing bruises develop on your leg.   Your leg swells, feels tight, or becomes red.   You have numbness in your leg.  SEEK IMMEDIATE MEDICAL CARE IF:  Your pain gets much worse.   Blood or fluid leaks from any of the incisions.   Your incisions  become warm, swollen, or red.   You have chest pain.   You have trouble breathing.   You have a fever.   You have more pain near your leg incision.  MAKE SURE YOU:  Understand these instructions.   Will watch your condition.   Will get help  right away if you are not doing well or get worse.  Document Released: 10/29/2010 Document Revised: 02/05/2011 Document Reviewed: 10/29/2010 [employer] Patient Information 2012 [place] Fibrillation Your caregiver has diagnosed you with atrial fibrillation (AFib). The heart normally beats very regularly; AFib is a type of irregular heartbeat. The heart rate may be faster or slower than normal. This can prevent your heart from pumping as well as it should. AFib can be constant (chronic) or intermittent (paroxysmal). CAUSES  Atrial fibrillation may be caused by:  Heart disease, including heart attack, coronary artery disease, heart failure, diseases of the heart valves, and others.   Blood clot in the lungs (pulmonary embolism).   Pneumonia or other infections.   Chronic lung disease.   Thyroid disease.   Toxins. These include alcohol, some medications (such as decongestant medications or diet pills), and caffeine.  In some people, no cause for AFib can be found. This is referred to as Lone Atrial Fibrillation. SYMPTOMS   Palpitations or a fluttering in your chest.   A vague sense of chest discomfort.   Shortness of breath.   Sudden onset of lightheadedness or weakness.  Sometimes, the first sign of AFib can be a complication of the condition. This could be a stroke or heart failure. DIAGNOSIS  Your description of your condition may make your caregiver suspicious of atrial fibrillation. Your caregiver will examine your pulse to determine if fibrillation is present. An EKG (electrocardiogram) will confirm the diagnosis. Further testing may help determine what caused you to have atrial fibrillation. This may include chest x-ray, echocardiogram, blood tests, or CT scans. PREVENTION  If you have previously had atrial fibrillation, your caregiver may advise you to avoid substances known to cause the condition (such as stimulant medications, and possibly caffeine or alcohol). You  may be advised to use medications to prevent recurrence. Proper treatment of any underlying condition is important to help prevent recurrence. PROGNOSIS  Atrial fibrillation does tend to become a chronic condition over time. It can cause significant complications (see below). Atrial fibrillation is not usually immediately life-threatening, but it can shorten your life expectancy. This seems to be worse in women. If you have lone atrial fibrillation and are under 32 years old, the risk of complications is very low, and life expectancy is not shortened. RISKS AND COMPLICATIONS  Complications of atrial fibrillation can include stroke, chest pain, and heart failure. Your caregiver will recommend treatments for the atrial fibrillation, as well as for any underlying conditions, to help minimize risk of complications. TREATMENT  Treatment for AFib is divided into several categories:  Treatment of any underlying condition.   Converting you out of AFib into a regular (sinus) rhythm.   Controlling rapid heart rate.   Prevention of blood clots and stroke.  Medications and procedures are available to convert your atrial fibrillation to sinus rhythm. However, recent studies have shown that this may not offer you any advantage, and cardiac experts are continuing research and debate on this topic. More important is controlling your rapid heartbeat. The rapid heartbeat causes more symptoms, and places strain on your heart. Your caregiver will advise you on the use of medications that can control your  heart rate. Atrial fibrillation is a strong stroke risk. You can lessen this risk by taking blood thinning medications such as Coumadin (warfarin), or sometimes aspirin. These medications need close monitoring by your caregiver. Over-medication can cause bleeding. Too little medication may not protect against stroke. HOME CARE INSTRUCTIONS   If your caregiver prescribed medicine to make your heartbeat more normally,  take as directed.   If blood thinners were prescribed by your caregiver, take EXACTLY as directed.   Perform blood tests EXACTLY as directed.   Quit smoking. Smoking increases your cardiac and lung (pulmonary) risks.   DO NOT drink alcohol.   DO NOT drink caffeinated drinks (e.g. coffee, soda, chocolate, and leaf teas). You may drink decaffeinated coffee, soda or tea.   If you are overweight, you should choose a reduced calorie diet to lose weight. Please see a registered dietitian if you need more information about healthy weight loss. DO NOT USE DIET PILLS as they may aggravate heart problems.   If you have other heart problems that are causing AFib, you may need to eat a low salt, fat, and cholesterol diet. Your caregiver will tell you if this is necessary.   Exercise every day to improve your physical fitness. Stay active unless advised otherwise.   If your caregiver has given you a follow-up appointment, it is very important to keep that appointment. Not keeping the appointment could result in heart failure or stroke. If there is any problem keeping the appointment, you must call back to this facility for assistance.  SEEK MEDICAL CARE IF:  You notice a change in the rate, rhythm or strength of your heartbeat.   You develop an infection or any other change in your overall health status.  SEEK IMMEDIATE MEDICAL CARE IF:   You develop chest pain, abdominal pain, sweating, weakness or feel sick to your stomach (nausea).   You develop shortness of breath.   You develop swollen feet and ankles.   You develop dizziness, numbness, or weakness of your face or limbs, or any change in vision or speech.  MAKE SURE YOU:   Understand these instructions.   Will watch your condition.   Will get help right away if you are not doing well or get worse.  Document Released: 02/16/2005 Document Revised: 02/05/2011 Document Reviewed: 09/21/2007 [facility] Patient Information 2012 [place],  [place].C.

## 2011-11-03 NOTE — Progress Notes (Signed)
[id number] Cardiac Rehab Completed discharge education with pt and wife. Pt voices understanding.

## 2011-11-03 NOTE — Progress Notes (Addendum)
Inpatient Diabetes Program Recommendations  AACE/ADA: New Consensus Statement on Inpatient Glycemic Control (2013)  Target Ranges:  Prepandial:   less than 140 mg/dL      Peak postprandial:   less than 180 mg/dL (1-2 hours)      Critically ill patients:  140 - 180 mg/dL   Reason for Visit: Note A1C=8.2% indicating sub-optimal glycemic control prior to admit.  Will order "Living Well with Diabetes" booklet for patient.  Would benefit from further education with CDE during cardiac rehab.   Addendum: Talked to patient and he states his PCP Dr. Zachery Dauer has referred him to an endocrinologist and he plans to make appointment.

## 2011-11-03 NOTE — Progress Notes (Signed)
Pt encouraged to get out of chair and ambulate after eating; pt stated him and wife would walk around unit soon; will cont. To monitor.

## 2011-11-03 NOTE — Progress Notes (Signed)
Removed pt's EPW and chest tube sutures per MD order. No bleeding noted. INR is WNL and no arrhythmias overnight. Pt Tolerated well. Tips were intact on the wires. Placed steri strips and benzoin over chest tube suture site. Pt is on bed rest for one hour with frequent vitals set up.

## 2011-11-03 NOTE — Discharge Summary (Signed)
Physician Discharge Summary  Patient ID: Cody Thomas MRN: [id number] DOB/AGE: 1930/11/29 78 y.o.  Admit date: 10/29/2011 Discharge date: 11/03/2011  Admission Diagnoses: 1.History of  CAD (s/p PCi with stents to LAD and Circumflex,09') 2.History of hypertension 3.History of hyperlipidemia 4.History of DM 5.History of CKD 6.History of nose cancer  Discharge Diagnoses:  1.History of  CAD (s/p PCi with stents to LAD and Circumflex,09') 2.History of hypertension 3.History of hyperlipidemia 4.History of DM 5.History of CKD 6.History of nose cancer 7.Post op afib 8.ABL anemia   Procedure (s):  1. Coronary artery bypass grafting x4 (left internal mammary artery to LAD, saphenous vein graft to diagonal, saphenous vein graft to  obtuse marginal, saphenous vein graft to posterior descending) with endoscopic harvest of right leg greater saphenous vein by Dr. Donata Clay on 10/29/2011.  History of Presenting Illness: This is  a 78 year old obese, diabetic, non smoking male with class III exertional angina. His symptoms include upper chest tightness that radiated to the neck, jaw ,and left arm. Symptoms usually resolvde with rest. He denied nocturnal symptoms. He denied symptoms of heart failure. The patient had 2 stents placed in 2009 by Dr. Ophelia Shoulder and circumflex. Recent cardiac catheterization performed by Dr. Anne Fu (following a positive stress test) showed recurrent high grade 3 vessel CAD with preserved LV systolic function. Catheterization  was performed via right radial artery without complication. Results of a 2-D echo are pending. Surgical coronary revascularization was recommended by his cardiologist, rather than repeat PCI. The patient has a creatinine of 1.55 and was told to hold his losartan for 48 hours post cath. In addition, the patient has not been on Plavix for several weeks.Potential risks, complications, and benefits of the surgery were discussed with the patient and he  agreed to proceed. He underwent a CABGx4 on 10/29/2011.   Brief Hospital Course:  He was extubated without difficulty the evening of surgery. He remained afebrile and hemodynamically stable. His Theone Murdoch, a line, chest tubes, and foley were all removed early in his post operative course. He was started on low dose Lopressor. He was volume overloaded and diuresed accordingly. He did go into a fib with a CVR. He was placed on Amiodarone. He was also placed on Coumadin.His PT and INR were monitored closely.He did eventually convert to sinus rhythm.He has a history of DM and his HGA1C is 8.2. He was restarted on his Januvia, once he was tolerating a diet. He may need another oral agent or low dose insulin. He will need follow up with his medical doctor. He was surgically stable for transfer from the ICU to 2000 for further convalescence on 11/02/2011. He has been tolerating a diet and has had a bowel movement.His epicardial pacing wire and chest tube sutures have been removed.Provided he remains afebrile, hemodynamically stable, and pending morning round evaluation, he will be surgically stable for discharge.  Latest Vital Signs: Blood pressure 129/65, pulse 80, temperature 99.2 F (37.3 C), temperature source Oral, resp. rate 18, height 5\' 6"  (1.676 m), weight 200 lb 3.2 oz (90.81 kg), SpO2 92.00%.  Physical Exam: Cardiovascular: RRR, no murmurs, gallops, or rubs.  Pulmonary: Clear to auscultation bilaterally; no rales, wheezes, or rhonchi.  Abdomen: Soft, non tender, bowel sounds present.  Extremities: Mild bilateral lower extremity edema.Ecchymosis right thigh.  Wounds: Clean and dry. No erythema or signs of infection.   Discharge Condition:Stable  Recent laboratory studies:  Lab Results  Component Value Date   WBC 8.5 11/02/2011   HGB 9.9* 11/02/2011  HCT 29.2* 11/02/2011   MCV 92.4 11/02/2011   PLT 161 11/02/2011   Lab Results  Component Value Date   NA 136 11/02/2011   K 3.7 11/02/2011   CL 99  11/02/2011   CO2 29 11/02/2011   CREATININE 1.33 11/02/2011   GLUCOSE 123* 11/02/2011     Diagnostic Studies: DG Portable View  10/31/2011  *RADIOLOGY REPORT*  Clinical Data: Post heart surgery  PORTABLE CHEST - 1 VIEW  Comparison:   the previous day's study  Findings: Swan-Ganz has been retracted leaving the right IJ venous sheath in place.  The mediastinal drain has been removed.  Left chest tube remains in place with no pneumothorax.  Changes of CABG. Stable cardiomegaly.  Perihilar and infrahilar interstitial and airspace infiltrates or edema, left greater than right, slightly increased.  Probable small left effusion.  IMPRESSION:  1.  Slight worsening of asymmetric infiltrates or edema. 2.  Stable left chest tube, no pneumothorax.   Original Report Authenticated By: Osa Craver, M.D.    Discharge Orders    Future Appointments: Provider: Department: Dept Phone: Center:   11/25/2011 4:30 PM Kerin Perna, MD [facility] [phone] [id number]     Future Orders Please Complete By Expires   Amb Referral to Cardiac Rehabilitation         Discharge Medications: Medication List  As of 11/03/2011  3:37 PM   Medication List  As of 11/03/2011  4:05 PM   STOP taking these medications         losartan-hydrochlorothiazide 100-25 MG per tablet         TAKE these medications         amiodarone 200 MG tablet   Commonly known as: PACERONE   Take 1 tablet (200 mg total) by mouth 2 (two) times daily.      aspirin 325 MG EC tablet   Take 1 tablet (325 mg total) by mouth daily.      calcium carbonate 500 MG chewable tablet   Commonly known as: TUMS - dosed in mg elemental calcium   Chew 2 tablets by mouth daily as needed. For indigestion.      ezetimibe 10 MG tablet   Commonly known as: ZETIA   Take 10 mg by mouth daily.      fish oil-omega-3 fatty acids 1000 MG capsule   Take 2 g by mouth 2 (two) times daily.      furosemide 40 MG tablet   Commonly known as: LASIX   Take 1 tablet  (40 mg total) by mouth daily.      metoprolol tartrate 25 MG tablet   Commonly known as: LOPRESSOR   Take 1 tablet (25 mg total) by mouth 2 (two) times daily.      potassium chloride SA 20 MEQ tablet   Commonly known as: K-DUR,KLOR-CON   Take 1 tablet (20 mEq total) by mouth daily.      rosuvastatin 10 MG tablet   Commonly known as: CRESTOR   Take 10 mg by mouth 3 (three) times a week.      sitaGLIPtin 50 MG tablet   Commonly known as: JANUVIA   Take 50 mg by mouth daily.      traMADol 50 MG tablet   Commonly known as: ULTRAM   Take 1 tablet (50 mg total) by mouth every 4 (four) hours as needed for pain.            Follow Up Appointments: Follow-up Information  Follow up with VAN Dinah Beers, MD. (11/25/2011 at 4:30 PM. Please obtain a chest x-ray at [facility] imaging at 3:30 PM. [facility] imaging is located in the same office complex as the surgeon.)    Contact information:   [address] [place] [address] [phone]       Follow up with Donato Schultz, MD. (2 weeks-please contact the office to arrange this appointment. Additionally, obtain an appointment through his office to have PT/INR blood test done 11/06/2011 so they can adjust your Coumadin dose.)    Contact information:   [address] [place] [address] [phone]          Signed: Doree Fudge MPA-C 11/03/2011, 1:44 PM

## 2011-11-03 NOTE — Progress Notes (Signed)
CARDIAC REHAB PHASE I   PRE:  Rate/Rhythm: 67 SR  BP:  Supine:   Sitting: 140/74  Standing:    SaO2: 93 RA  MODE:  Ambulation: 890 ft   POST:  Rate/Rhythem: 77  BP:  Supine:   Sitting: 158/90  Standing:    SaO2: 94 RA 1020-1100 Pt tolerated ambulation well with hand held assist. Gait steady Pt walked 890 feet. Pt only c/o is SOB. RA sat after walk 94%. Pt back to chair after walk with wife present and call light in reach. Discussed Outpt. CRP with pt He agrees to referral to [place].  Beatrix Fetters

## 2011-11-03 NOTE — Progress Notes (Signed)
5 Days Post-Op Procedure(s) (LRB): CORONARY ARTERY BYPASS GRAFTING (CABG) (N/A)  Subjective: Patient without complaints. He wants to go home today.  Objective: Vital signs in last 24 hours: Patient Vitals for the past 24 hrs:  BP Temp Temp src Pulse Resp SpO2 Weight  11/03/11 0542 137/75 mmHg 99.2 F (37.3 C) Oral 68  18  92 % 200 lb 3.2 oz (90.81 kg)  11/02/11 2023 143/79 mmHg 98.9 F (37.2 C) Oral 73  16  92 % -  11/02/11 1403 138/75 mmHg 98.4 F (36.9 C) Oral 72  18  92 % -   Pre op weight  87.1 kg Current Weight  11/03/11 200 lb 3.2 oz (90.81 kg)          Physical Exam:  Cardiovascular: RRR, no murmurs, gallops, or rubs. Pulmonary: Clear to auscultation bilaterally; no rales, wheezes, or rhonchi. Abdomen: Soft, non tender, bowel sounds present. Extremities: Mild bilateral lower extremity edema.Ecchymosis right thigh. Wounds: Clean and dry.  No erythema or signs of infection.  Lab Results: CBC: Basename 11/02/11 0540  WBC 8.5  HGB 9.9*  HCT 29.2*  PLT 161   BMET:  Basename 11/02/11 0540 11/01/11 0430  NA 136 136  K 3.7 3.5  CL 99 98  CO2 29 32  GLUCOSE 123* 105*  BUN 30* 30*  CREATININE 1.33 1.50*  CALCIUM 8.5 8.5    PT/INR:  Lab Results  Component Value Date   INR 1.10 11/03/2011   INR 1.19 11/02/2011   INR 1.38 10/29/2011   ABG:  INR: Will add last result for INR, ABG once components are confirmed Will add last 4 CBG results once components are confirmed  Assessment/Plan:  1. CV - Previous afib.Maintaining SR.Had brief episode of bradycardia (37) earlier this am.Continue Amiodarone 400 bid and Lopressor 25 bid. 2.  Pulmonary - Encourage incentive spirometer. 3. Volume Overload - Continue with diuresis. 4.  Acute blood loss anemia - Last H and H 9.9 and 29.2. 5.DM-CBGs 145/134/142.Continue Januvia.Pre op HGA1C 8.2.Will need follow up as an outpatient. 6.Remove EPW. 7. Will discuss discharge disposition with surgeon-later today vs. In  am.  ZIMMERMAN,DONIELLE MPA-C 11/03/2011

## 2011-11-04 ENCOUNTER — Inpatient Hospital Stay (HOSPITAL_COMMUNITY): Payer: Medicare Other

## 2011-11-04 LAB — GLUCOSE, CAPILLARY: Glucose-Capillary: 135 mg/dL — ABNORMAL HIGH (ref 70–99)

## 2011-11-04 NOTE — Progress Notes (Signed)
6 Days Post-Op Procedure(s) (LRB): CORONARY ARTERY BYPASS GRAFTING (CABG) (N/A)  Subjective: Cody Thomas has no complaints this morning.  He looks great and is ambulating without difficulty.   Objective: Vital signs in last 24 hours: Temp:  [98.7 F (37.1 C)-99 F (37.2 C)] 99 F (37.2 C) (09/04 0453) Pulse Rate:  [65-82] 65  (09/04 0453) Cardiac Rhythm:  [-] Normal sinus rhythm;Heart block (09/03 1935) Resp:  [18] 18  (09/04 0453) BP: (127-147)/(65-77) 133/68 mmHg (09/04 0453) SpO2:  [94 %-95 %] 94 % (09/04 0453) Weight:  [200 lb 12.8 oz (91.082 kg)] 200 lb 12.8 oz (91.082 kg) (09/04 0547)   Intake/Output from previous day: 09/03 0701 - 09/04 0700 In: 840 [P.O.:840] Out: -   General appearance: alert, cooperative and no distress Neurologic: intact Heart: regular rate and rhythm Lungs: clear to auscultation bilaterally Abdomen: soft, non-tender; bowel sounds normal; no masses,  no organomegaly Extremities: edema 1-2+, ecchymosis of right thigh Wound: clean and dry  Lab Results:  Basename 11/02/11 0540  WBC 8.5  HGB 9.9*  HCT 29.2*  PLT 161   BMET:  Basename 11/02/11 0540  NA 136  K 3.7  CL 99  CO2 29  GLUCOSE 123*  BUN 30*  CREATININE 1.33  CALCIUM 8.5    PT/INR:  Basename 11/03/11 0700  LABPROT 14.4  INR 1.10   ABG    Component Value Date/Time   PHART 7.303* 10/29/2011 2009   HCO3 23.6 10/29/2011 2009   TCO2 23 10/30/2011 1652   ACIDBASEDEF 3.0* 10/29/2011 2009   O2SAT 94.0 10/29/2011 2009   CBG (last 3)   Basename 11/04/11 0552 11/03/11 2121 11/03/11 1623  GLUCAP 135* 167* 121*    Assessment/Plan: S/P Procedure(s) (LRB): CORONARY ARTERY BYPASS GRAFTING (CABG) (N/A)  1. CV- Previous Atrial Fibrillation- on Lopressor and Amiodarone 2. Pulm- encouraged continued IS use at discharge, no CXR done after chest tube removal, will get one prior to discharge for baseline 3. Volume Overload- continue diuresis 4. DM- CBGs moderately controlled preop,  will follow up with PCP who will refer patient to Endocrinology 5. Disop- patient doing well, will get CXR and plan for discharge today   LOS: 6 days    Cody Thomas 11/04/2011

## 2011-11-04 NOTE — Progress Notes (Signed)
Patient evaluated for long-term disease management services with [facility] as a result of his [employer]. Patient will receive a post discharge transition of care call and will be evaluated for monthly home visits for assessments and for education. Unfortunately, [name] had already discharged upon meeting at bedside.  Cody Noble, MSN- Ed, RN,BSN [facility] [phone]

## 2011-11-04 NOTE — Progress Notes (Signed)
Pt ambulated 550 ft, no complaints of pain or SOB. Pt now resting in bed, will continue to monitor.

## 2011-11-12 NOTE — Discharge Summary (Signed)
patient examined and medical record reviewed,agree with above note. VAN TRIGT III,PETER 11/12/2011    

## 2011-11-23 ENCOUNTER — Other Ambulatory Visit: Payer: Self-pay | Admitting: Cardiothoracic Surgery

## 2011-11-23 DIAGNOSIS — I2510 Atherosclerotic heart disease of native coronary artery without angina pectoris: Secondary | ICD-10-CM

## 2011-11-25 ENCOUNTER — Ambulatory Visit (INDEPENDENT_AMBULATORY_CARE_PROVIDER_SITE_OTHER): Payer: Self-pay | Admitting: Cardiothoracic Surgery

## 2011-11-25 ENCOUNTER — Encounter: Payer: Self-pay | Admitting: Cardiothoracic Surgery

## 2011-11-25 ENCOUNTER — Ambulatory Visit
Admission: RE | Admit: 2011-11-25 | Discharge: 2011-11-25 | Disposition: A | Discharge status: Home / Self Care | Payer: Medicare Other | Source: Ambulatory Visit | Attending: Cardiothoracic Surgery | Admitting: Cardiothoracic Surgery

## 2011-11-25 VITALS — BP 177/91 | HR 54 | Resp 18 | Ht 67.0 in | Wt 193.0 lb

## 2011-11-25 DIAGNOSIS — I2510 Atherosclerotic heart disease of native coronary artery without angina pectoris: Secondary | ICD-10-CM

## 2011-11-25 DIAGNOSIS — I10 Essential (primary) hypertension: Secondary | ICD-10-CM

## 2011-11-25 DIAGNOSIS — Z951 Presence of aortocoronary bypass graft: Secondary | ICD-10-CM

## 2011-11-25 NOTE — Progress Notes (Signed)
PCP is Gaye Alken, MD Referring Provider is Donato Schultz, MD  Chief Complaint  Patient presents with  . Routine Post Op    3 week f/u from surgery with CXR, S/P CABG x 4 on 10/29/2011    HPI: 78 year old obese diabetic with COPD had CABG x4  3 weeks ago and is doing well at home. He has had no recurrent angina. The surgical incision is healing well. He has had some edema.   Past Medical History  Diagnosis Date  . Hyperlipidemia     Crestor 3 x wk and Zetia daily  . Hx of cardiac cath   . Coronary atherosclerosis of native coronary artery     2009 LAD CIRC DES  . Chronic kidney disease     stage 111  . PONV (postoperative nausea and vomiting)     pt states extremely sick  . Hypertension     takes Hyzaar daily  . Shortness of breath     with exertion  . Headache   . Dizziness   . Joint pain   . History of gout     doesn't require meds  . Cancer     nose/Dr. Albertini;skin  . H/O hiatal hernia   . GERD (gastroesophageal reflux disease)     TUMS prn  . Chronic constipation   . Enlarged prostate     but doesn't require meds at present  . Diabetes mellitus     takes Januvia daily  . Anxiety   . Depression     doesn't take any meds for this  . Insomnia     doesn't require meds at present time    Past Surgical History  Procedure Date  . Cholecystectomy   . Eye surgery     bilateral cataracts  . Hemorrhoid surgery   . Rotator cuff surgery     right   . Hernia repair     x 2;inguinal  . Cyst removed from finger     right hand  . Cataracts removed   . Skin cancer removed   . Cardiac catheterization 2013  . Coronary angioplasty     2 stents from 2009  . Colonoscopy   . Coronary artery bypass graft 10/29/2011    Procedure: CORONARY ARTERY BYPASS GRAFTING (CABG);  Surgeon: Kerin Perna, MD;  Location: [facility] OR;  Service: Open Heart Surgery;  Laterality: N/A;    Family History  Problem Relation Age of Onset  . Stroke Father   . Cancer Brother      Social History History  Substance Use Topics  . Smoking status: Never Smoker   . Smokeless tobacco: Never Used   Comment: quit in 1986  . Alcohol Use: No    Current Outpatient Prescriptions  Medication Sig Dispense Refill  . amiodarone (PACERONE) 200 MG tablet Take 200 mg by mouth daily.      Marland Kitchen aspirin 325 MG EC tablet Take 1 tablet (325 mg total) by mouth daily.      . calcium carbonate (TUMS - DOSED IN MG ELEMENTAL CALCIUM) 500 MG chewable tablet Chew 2 tablets by mouth daily as needed. For indigestion.       Marland Kitchen ezetimibe (ZETIA) 10 MG tablet Take 10 mg by mouth daily.      . fish oil-omega-3 fatty acids 1000 MG capsule Take 2 g by mouth 2 (two) times daily.       . metoprolol tartrate (LOPRESSOR) 25 MG tablet Take 1 tablet (25 mg total) by mouth 2 (  two) times daily.  60 tablet  1  . rosuvastatin (CRESTOR) 10 MG tablet Take 10 mg by mouth 3 (three) times a week.      . sitaGLIPtin (JANUVIA) 50 MG tablet Take 50 mg by mouth daily.      Marland Kitchen DISCONTD: amiodarone (PACERONE) 200 MG tablet Take 1 tablet (200 mg total) by mouth 2 (two) times daily.  60 tablet  1    Allergies  Allergen Reactions  . Crestor (Rosuvastatin)     Nightmares in high dosage  . Zocor (Simvastatin)     Nightmares in high dosage    Review of Systems no fever, good appetite, no wound problems. Not on home oxygen  BP 177/91  Pulse 54  Resp 18  Ht 5\' 7"  (1.702 m)  Wt 193 lb (87.544 kg)  BMI 30.23 kg/m2  SpO2 95% Physical Exam General alert and comfortable accompanied by wife HEENT normocephalic Lungs clear Sternal incision clean and dry, stable Cardiac regular rhythm no murmur or rub Extremities mild edema Neuro intact  Diagnostic Tests: Chest x-ray mild left basilar atelectasis, resolution of previously noted right pleural effusion  Impression: Doing well 3 weeks after CABG x4. He may drive. He needs to resume his preoperative blood pressure medication losartan redo hypertension. He'll continue  his beta blocker as well. Will give a course of oral Lasix for his pedal edema. He return for followup in 3-4 weeks with blood pressure check.  Plan: Resume losartan return for check in 3-4 weeks

## 2011-12-18 ENCOUNTER — Other Ambulatory Visit: Payer: Self-pay | Admitting: Cardiothoracic Surgery

## 2011-12-18 DIAGNOSIS — Z951 Presence of aortocoronary bypass graft: Secondary | ICD-10-CM

## 2011-12-23 ENCOUNTER — Ambulatory Visit
Admission: RE | Admit: 2011-12-23 | Discharge: 2011-12-23 | Disposition: A | Discharge status: Home / Self Care | Payer: Medicare Other | Source: Ambulatory Visit | Attending: Cardiothoracic Surgery | Admitting: Cardiothoracic Surgery

## 2011-12-23 ENCOUNTER — Ambulatory Visit (INDEPENDENT_AMBULATORY_CARE_PROVIDER_SITE_OTHER): Payer: Self-pay | Admitting: Cardiothoracic Surgery

## 2011-12-23 ENCOUNTER — Encounter: Payer: Self-pay | Admitting: Cardiothoracic Surgery

## 2011-12-23 VITALS — BP 138/80 | HR 58 | Resp 18 | Ht 67.0 in | Wt 188.0 lb

## 2011-12-23 DIAGNOSIS — Z951 Presence of aortocoronary bypass graft: Secondary | ICD-10-CM

## 2011-12-23 DIAGNOSIS — I2510 Atherosclerotic heart disease of native coronary artery without angina pectoris: Secondary | ICD-10-CM

## 2011-12-23 DIAGNOSIS — J90 Pleural effusion, not elsewhere classified: Secondary | ICD-10-CM

## 2011-12-23 NOTE — Progress Notes (Signed)
PCP is Gaye Alken, MD Referring Provider is Donato Schultz, MD  Chief Complaint  Patient presents with  . Routine Post Op    4 week f/u with CXR, S/P CABG x4 on 10/29/11    HPI: 78 year old gentleman returns for final postoperative office visit after multivessel bypass grafting 8 weeks ago. On his last visit he had a mild to moderate right pleural effusion and was placed on a course of Lasix and returns now for followup. His chest x-ray currently shows resolution of the right pleural effusion. 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 The patient is noted he can tolerate exercise better without neck or jaw pain that he had prior to CABG. He is ready to start cardiac rehabilitation is his activity level to include doing light yard work around the home. His incisions remain well-healed and he has no symptoms of fluid retention or CHF.  Past Medical History  Diagnosis Date  . Hyperlipidemia     Crestor 3 x wk and Zetia daily  . Hx of cardiac cath   . Coronary atherosclerosis of native coronary artery     2009 LAD CIRC DES  . Chronic kidney disease     stage 111  . PONV (postoperative nausea and vomiting)     pt states extremely sick  . Hypertension     takes Hyzaar daily  . Shortness of breath     with exertion  . Headache   . Dizziness   . Joint pain   . History of gout     doesn't require meds  . Cancer     nose/Dr. Albertini;skin  . H/O hiatal hernia   . GERD (gastroesophageal reflux disease)     TUMS prn  . Chronic constipation   . Enlarged prostate     but doesn't require meds at present  . Diabetes mellitus     takes Januvia daily  . Anxiety   . Depression     doesn't take any meds for this  . Insomnia     doesn't require meds at present time    Past Surgical History  Procedure Date  . Cholecystectomy   . Eye surgery     bilateral cataracts  . Hemorrhoid surgery   . Rotator cuff surgery     right   . Hernia repair     x  2;inguinal  . Cyst removed from finger     right hand  . Cataracts removed   . Skin cancer removed   . Cardiac catheterization 2013  . Coronary angioplasty     2 stents from 2009  . Colonoscopy   . Coronary artery bypass graft 10/29/2011    Procedure: CORONARY ARTERY BYPASS GRAFTING (CABG);  Surgeon: Kerin Perna, MD;  Location: [facility] OR;  Service: Open Heart Surgery;  Laterality: N/A;    Family History  Problem Relation Age of Onset  . Stroke Father   . Cancer Brother     Social History History  Substance Use Topics  . Smoking status: Never Smoker   . Smokeless tobacco: Never Used   Comment: quit in 1986  . Alcohol Use: No    Current Outpatient Prescriptions  Medication Sig Dispense Refill  . aspirin 325 MG EC tablet Take 1 tablet (325 mg total) by mouth daily.      . calcium carbonate (TUMS - DOSED IN MG ELEMENTAL CALCIUM) 500 MG chewable tablet Chew 2 tablets by mouth daily as needed. For indigestion.       Marland Kitchen  ezetimibe (ZETIA) 10 MG tablet Take 10 mg by mouth daily.      . fish oil-omega-3 fatty acids 1000 MG capsule Take 2 g by mouth 2 (two) times daily.       Marland Kitchen losartan (COZAAR) 100 MG tablet Take 100 mg by mouth daily.      . metoprolol tartrate (LOPRESSOR) 25 MG tablet Take 1 tablet (25 mg total) by mouth 2 (two) times daily.  60 tablet  1  . rosuvastatin (CRESTOR) 10 MG tablet Take 10 mg by mouth 3 (three) times a week.      . sitaGLIPtin (JANUVIA) 50 MG tablet Take 50 mg by mouth daily.        Allergies  Allergen Reactions  . Crestor (Rosuvastatin)     Nightmares in high dosage  . Zocor (Simvastatin)     Nightmares in high dosage    Review of Systems no fever, no shortness of breath, no ankle edema BP 138/80  Pulse 58  Resp 18  Ht 5\' 7"  (1.702 m)  Wt 188 lb (85.276 kg)  BMI 29.44 kg/m2  SpO2 94% Physical Exam Alert and comfortable Lungs clear Sternal incision well-healed Cardiac rhythm regular without murmur or gallop No pedal edema  Diagnostic  Tests:  Chest x-ray shows resolution of right pleural effusion with oral Lasix Impression: Continue current medications, start cardiac rehabilitation, do not lift more than 20 pounds until 3 months after surgery, return to this office when necessary  Plan:

## 2012-01-07 ENCOUNTER — Encounter (HOSPITAL_COMMUNITY)
Admission: RE | Admit: 2012-01-07 | Discharge: 2012-01-07 | Disposition: A | Discharge status: Home / Self Care | Payer: Medicare Other | Source: Ambulatory Visit | Attending: Cardiology | Admitting: Cardiology

## 2012-01-07 DIAGNOSIS — Z951 Presence of aortocoronary bypass graft: Secondary | ICD-10-CM | POA: Insufficient documentation

## 2012-01-07 DIAGNOSIS — Z87891 Personal history of nicotine dependence: Secondary | ICD-10-CM | POA: Insufficient documentation

## 2012-01-07 DIAGNOSIS — I498 Other specified cardiac arrhythmias: Secondary | ICD-10-CM | POA: Insufficient documentation

## 2012-01-07 DIAGNOSIS — I2510 Atherosclerotic heart disease of native coronary artery without angina pectoris: Secondary | ICD-10-CM | POA: Insufficient documentation

## 2012-01-07 DIAGNOSIS — N189 Chronic kidney disease, unspecified: Secondary | ICD-10-CM | POA: Insufficient documentation

## 2012-01-07 DIAGNOSIS — Z5189 Encounter for other specified aftercare: Secondary | ICD-10-CM | POA: Insufficient documentation

## 2012-01-07 DIAGNOSIS — E785 Hyperlipidemia, unspecified: Secondary | ICD-10-CM | POA: Insufficient documentation

## 2012-01-07 DIAGNOSIS — I129 Hypertensive chronic kidney disease with stage 1 through stage 4 chronic kidney disease, or unspecified chronic kidney disease: Secondary | ICD-10-CM | POA: Insufficient documentation

## 2012-01-07 DIAGNOSIS — I200 Unstable angina: Secondary | ICD-10-CM | POA: Insufficient documentation

## 2012-01-07 DIAGNOSIS — K219 Gastro-esophageal reflux disease without esophagitis: Secondary | ICD-10-CM | POA: Insufficient documentation

## 2012-01-07 DIAGNOSIS — E669 Obesity, unspecified: Secondary | ICD-10-CM | POA: Insufficient documentation

## 2012-01-07 DIAGNOSIS — E119 Type 2 diabetes mellitus without complications: Secondary | ICD-10-CM | POA: Insufficient documentation

## 2012-01-07 DIAGNOSIS — F411 Generalized anxiety disorder: Secondary | ICD-10-CM | POA: Insufficient documentation

## 2012-01-07 DIAGNOSIS — Z9861 Coronary angioplasty status: Secondary | ICD-10-CM | POA: Insufficient documentation

## 2012-01-07 DIAGNOSIS — I4891 Unspecified atrial fibrillation: Secondary | ICD-10-CM | POA: Insufficient documentation

## 2012-01-07 NOTE — Progress Notes (Signed)
Cardiac Rehab Medication Review by a Pharmacist  Does the patient  feel that his/her medications are working for him/her?  yes  Has the patient been experiencing any side effects to the medications prescribed?  no  Does the patient measure his/her own blood pressure or blood glucose at home?  yes   Does the patient have any problems obtaining medications due to transportation or finances?   no  Understanding of regimen: good Understanding of indications: excellent Potential of compliance: good    Pharmacist comments:  Patient was very knowledgeable of his regimen and only reports missing an average of 2-3 doses of medication per month. He did say that he feels his BP is well controlled during the day, but rises overnight and is often high in the AM. Could consider splitting up the losartan to twice a day for a more consistent effect    Drue Stager 01/07/2012 8:45 AM

## 2012-01-18 ENCOUNTER — Encounter (HOSPITAL_COMMUNITY)
Admission: RE | Admit: 2012-01-18 | Discharge: 2012-01-18 | Disposition: A | Discharge status: Home / Self Care | Payer: Medicare Other | Source: Ambulatory Visit | Attending: Cardiology | Admitting: Cardiology

## 2012-01-18 LAB — GLUCOSE, CAPILLARY
Glucose-Capillary: 205 mg/dL — ABNORMAL HIGH (ref 70–99)
Glucose-Capillary: 108 mg/dL — ABNORMAL HIGH (ref 70–99)

## 2012-01-18 NOTE — Progress Notes (Addendum)
Pt started cardiac rehab today.  Pt tolerated light exercise without difficulty. Telemetry Sinus rhythm with an occasional PVC ,first degree heart block.  Patient asymptomatic vital signs stable. Will continue to monitor the patient throughout  the program.

## 2012-01-20 ENCOUNTER — Encounter (HOSPITAL_COMMUNITY)
Admission: RE | Admit: 2012-01-20 | Discharge: 2012-01-20 | Disposition: A | Discharge status: Home / Self Care | Payer: Medicare Other | Source: Ambulatory Visit | Attending: Cardiology | Admitting: Cardiology

## 2012-01-20 LAB — GLUCOSE, CAPILLARY
Glucose-Capillary: 101 mg/dL — ABNORMAL HIGH (ref 70–99)
Glucose-Capillary: 119 mg/dL — ABNORMAL HIGH (ref 70–99)

## 2012-01-22 ENCOUNTER — Encounter (HOSPITAL_COMMUNITY)
Admission: RE | Admit: 2012-01-22 | Discharge: 2012-01-22 | Disposition: A | Discharge status: Home / Self Care | Payer: Medicare Other | Source: Ambulatory Visit | Attending: Cardiology | Admitting: Cardiology

## 2012-01-22 LAB — GLUCOSE, CAPILLARY
Glucose-Capillary: 151 mg/dL — ABNORMAL HIGH (ref 70–99)
Glucose-Capillary: 96 mg/dL (ref 70–99)

## 2012-01-22 NOTE — Progress Notes (Signed)
Reviewed home exercise with pt today.  Pt plans to walk and go to [employer] at the [facility] close to home for exercise.  Reviewed THR, pulse, RPE, sign and symptoms, and when to call 911 or MD.  Pt voiced understanding. Fabio Pierce, MA, ACSM RCEP

## 2012-01-25 ENCOUNTER — Encounter (HOSPITAL_COMMUNITY): Payer: Medicare Other

## 2012-01-27 ENCOUNTER — Encounter (HOSPITAL_COMMUNITY)
Admission: RE | Admit: 2012-01-27 | Discharge: 2012-01-27 | Disposition: A | Discharge status: Home / Self Care | Payer: Medicare Other | Source: Ambulatory Visit | Attending: Cardiology | Admitting: Cardiology

## 2012-01-27 LAB — GLUCOSE, CAPILLARY
Glucose-Capillary: 197 mg/dL — ABNORMAL HIGH (ref 70–99)
Glucose-Capillary: 124 mg/dL — ABNORMAL HIGH (ref 70–99)

## 2012-01-27 NOTE — Progress Notes (Signed)
Cody Thomas said he didn't come this past Monday to exercise due to increased skipped beats. Cody Thomas called Dr Anne Fu  And has an appointment to see Dr Anne Fu at 11:00 am.  Dr Anne Fu office says the patient was okay to exercise today.  Vital signs stable.  Patient asymptomatic with exercise today. Cody Thomas was noted to have intermittent PVC's that were frequent at times. Patient did have a nonsustained run of trigeminy. One hour ECG tracings with cumulative exercise flow sheet sent to Dr Anne Fu office for review. Will continue to monitor the patient throughout  the program.

## 2012-01-29 ENCOUNTER — Encounter (HOSPITAL_COMMUNITY): Payer: Medicare Other

## 2012-02-01 ENCOUNTER — Encounter (HOSPITAL_COMMUNITY)
Admission: RE | Admit: 2012-02-01 | Discharge: 2012-02-01 | Disposition: A | Discharge status: Home / Self Care | Payer: Medicare Other | Source: Ambulatory Visit | Attending: Cardiology | Admitting: Cardiology

## 2012-02-01 DIAGNOSIS — Z87891 Personal history of nicotine dependence: Secondary | ICD-10-CM | POA: Insufficient documentation

## 2012-02-01 DIAGNOSIS — Z9861 Coronary angioplasty status: Secondary | ICD-10-CM | POA: Insufficient documentation

## 2012-02-01 DIAGNOSIS — K219 Gastro-esophageal reflux disease without esophagitis: Secondary | ICD-10-CM | POA: Insufficient documentation

## 2012-02-01 DIAGNOSIS — Z5189 Encounter for other specified aftercare: Secondary | ICD-10-CM | POA: Insufficient documentation

## 2012-02-01 DIAGNOSIS — E669 Obesity, unspecified: Secondary | ICD-10-CM | POA: Insufficient documentation

## 2012-02-01 DIAGNOSIS — E119 Type 2 diabetes mellitus without complications: Secondary | ICD-10-CM | POA: Insufficient documentation

## 2012-02-01 DIAGNOSIS — F411 Generalized anxiety disorder: Secondary | ICD-10-CM | POA: Insufficient documentation

## 2012-02-01 DIAGNOSIS — I2510 Atherosclerotic heart disease of native coronary artery without angina pectoris: Secondary | ICD-10-CM | POA: Insufficient documentation

## 2012-02-01 DIAGNOSIS — E785 Hyperlipidemia, unspecified: Secondary | ICD-10-CM | POA: Insufficient documentation

## 2012-02-01 DIAGNOSIS — I200 Unstable angina: Secondary | ICD-10-CM | POA: Insufficient documentation

## 2012-02-01 DIAGNOSIS — I129 Hypertensive chronic kidney disease with stage 1 through stage 4 chronic kidney disease, or unspecified chronic kidney disease: Secondary | ICD-10-CM | POA: Insufficient documentation

## 2012-02-01 DIAGNOSIS — Z951 Presence of aortocoronary bypass graft: Secondary | ICD-10-CM | POA: Insufficient documentation

## 2012-02-01 DIAGNOSIS — I4891 Unspecified atrial fibrillation: Secondary | ICD-10-CM | POA: Insufficient documentation

## 2012-02-01 DIAGNOSIS — N189 Chronic kidney disease, unspecified: Secondary | ICD-10-CM | POA: Insufficient documentation

## 2012-02-01 DIAGNOSIS — I498 Other specified cardiac arrhythmias: Secondary | ICD-10-CM | POA: Insufficient documentation

## 2012-02-01 LAB — GLUCOSE, CAPILLARY: Glucose-Capillary: 123 mg/dL — ABNORMAL HIGH (ref 70–99)

## 2012-02-03 ENCOUNTER — Encounter (HOSPITAL_COMMUNITY)
Admission: RE | Admit: 2012-02-03 | Discharge: 2012-02-03 | Disposition: A | Discharge status: Home / Self Care | Payer: Medicare Other | Source: Ambulatory Visit | Attending: Cardiology | Admitting: Cardiology

## 2012-02-03 LAB — GLUCOSE, CAPILLARY: Glucose-Capillary: 99 mg/dL (ref 70–99)

## 2012-02-04 NOTE — Progress Notes (Addendum)
Heart rate noted at 53 post exercise nonsustained.  Patient asymptomatic.  Blood pressure 108/62. Will fax exercise flow sheets to Dr. Anne Fu office for review.

## 2012-02-05 ENCOUNTER — Encounter (HOSPITAL_COMMUNITY)
Admission: RE | Admit: 2012-02-05 | Discharge: 2012-02-05 | Disposition: A | Discharge status: Home / Self Care | Payer: Medicare Other | Source: Ambulatory Visit | Attending: Cardiology | Admitting: Cardiology

## 2012-02-05 LAB — GLUCOSE, CAPILLARY
Glucose-Capillary: 120 mg/dL — ABNORMAL HIGH (ref 70–99)
Glucose-Capillary: 119 mg/dL — ABNORMAL HIGH (ref 70–99)

## 2012-02-08 ENCOUNTER — Encounter (HOSPITAL_COMMUNITY)
Admission: RE | Admit: 2012-02-08 | Discharge: 2012-02-08 | Disposition: A | Discharge status: Home / Self Care | Payer: Medicare Other | Source: Ambulatory Visit | Attending: Cardiology | Admitting: Cardiology

## 2012-02-08 LAB — GLUCOSE, CAPILLARY: Glucose-Capillary: 105 mg/dL — ABNORMAL HIGH (ref 70–99)

## 2012-02-10 ENCOUNTER — Encounter (HOSPITAL_COMMUNITY)
Admission: RE | Admit: 2012-02-10 | Discharge: 2012-02-10 | Disposition: A | Discharge status: Home / Self Care | Payer: Medicare Other | Source: Ambulatory Visit | Attending: Cardiology | Admitting: Cardiology

## 2012-02-10 LAB — GLUCOSE, CAPILLARY: Glucose-Capillary: 141 mg/dL — ABNORMAL HIGH (ref 70–99)

## 2012-02-12 ENCOUNTER — Encounter (HOSPITAL_COMMUNITY)
Admission: RE | Admit: 2012-02-12 | Discharge: 2012-02-12 | Disposition: A | Discharge status: Home / Self Care | Payer: Medicare Other | Source: Ambulatory Visit | Attending: Cardiology | Admitting: Cardiology

## 2012-02-12 LAB — GLUCOSE, CAPILLARY: Glucose-Capillary: 141 mg/dL — ABNORMAL HIGH (ref 70–99)

## 2012-02-15 ENCOUNTER — Encounter (HOSPITAL_COMMUNITY)
Admission: RE | Admit: 2012-02-15 | Discharge: 2012-02-15 | Disposition: A | Discharge status: Home / Self Care | Payer: Medicare Other | Source: Ambulatory Visit | Attending: Cardiology | Admitting: Cardiology

## 2012-02-15 LAB — GLUCOSE, CAPILLARY: Glucose-Capillary: 148 mg/dL — ABNORMAL HIGH (ref 70–99)

## 2012-02-17 ENCOUNTER — Encounter (HOSPITAL_COMMUNITY): Payer: Medicare Other

## 2012-02-19 ENCOUNTER — Encounter (HOSPITAL_COMMUNITY)
Admission: RE | Admit: 2012-02-19 | Discharge: 2012-02-19 | Disposition: A | Discharge status: Home / Self Care | Payer: Medicare Other | Source: Ambulatory Visit | Attending: Cardiology | Admitting: Cardiology

## 2012-02-19 LAB — GLUCOSE, CAPILLARY: Glucose-Capillary: 114 mg/dL — ABNORMAL HIGH (ref 70–99)

## 2012-02-22 ENCOUNTER — Encounter (HOSPITAL_COMMUNITY)
Admission: RE | Admit: 2012-02-22 | Discharge: 2012-02-22 | Disposition: A | Discharge status: Home / Self Care | Payer: Medicare Other | Source: Ambulatory Visit | Attending: Cardiology | Admitting: Cardiology

## 2012-02-22 LAB — GLUCOSE, CAPILLARY: Glucose-Capillary: 197 mg/dL — ABNORMAL HIGH (ref 70–99)

## 2012-02-26 ENCOUNTER — Encounter (HOSPITAL_COMMUNITY)
Admission: RE | Admit: 2012-02-26 | Discharge: 2012-02-26 | Disposition: A | Discharge status: Home / Self Care | Payer: Medicare Other | Source: Ambulatory Visit | Attending: Cardiology | Admitting: Cardiology

## 2012-02-29 ENCOUNTER — Encounter (HOSPITAL_COMMUNITY): Payer: Medicare Other

## 2012-03-02 ENCOUNTER — Encounter (HOSPITAL_COMMUNITY): Payer: Medicare Other

## 2012-03-04 ENCOUNTER — Encounter (HOSPITAL_COMMUNITY): Payer: Medicare Other

## 2012-03-07 ENCOUNTER — Encounter (HOSPITAL_COMMUNITY)
Admission: RE | Admit: 2012-03-07 | Discharge: 2012-03-07 | Disposition: A | Discharge status: Home / Self Care | Payer: Medicare Other | Source: Ambulatory Visit | Attending: Cardiology | Admitting: Cardiology

## 2012-03-07 DIAGNOSIS — N189 Chronic kidney disease, unspecified: Secondary | ICD-10-CM | POA: Insufficient documentation

## 2012-03-07 DIAGNOSIS — E119 Type 2 diabetes mellitus without complications: Secondary | ICD-10-CM | POA: Insufficient documentation

## 2012-03-07 DIAGNOSIS — I498 Other specified cardiac arrhythmias: Secondary | ICD-10-CM | POA: Insufficient documentation

## 2012-03-07 DIAGNOSIS — I4891 Unspecified atrial fibrillation: Secondary | ICD-10-CM | POA: Insufficient documentation

## 2012-03-07 DIAGNOSIS — Z951 Presence of aortocoronary bypass graft: Secondary | ICD-10-CM | POA: Insufficient documentation

## 2012-03-07 DIAGNOSIS — F411 Generalized anxiety disorder: Secondary | ICD-10-CM | POA: Insufficient documentation

## 2012-03-07 DIAGNOSIS — I129 Hypertensive chronic kidney disease with stage 1 through stage 4 chronic kidney disease, or unspecified chronic kidney disease: Secondary | ICD-10-CM | POA: Insufficient documentation

## 2012-03-07 DIAGNOSIS — E785 Hyperlipidemia, unspecified: Secondary | ICD-10-CM | POA: Insufficient documentation

## 2012-03-07 DIAGNOSIS — Z5189 Encounter for other specified aftercare: Secondary | ICD-10-CM | POA: Insufficient documentation

## 2012-03-07 DIAGNOSIS — I2510 Atherosclerotic heart disease of native coronary artery without angina pectoris: Secondary | ICD-10-CM | POA: Insufficient documentation

## 2012-03-07 DIAGNOSIS — K219 Gastro-esophageal reflux disease without esophagitis: Secondary | ICD-10-CM | POA: Insufficient documentation

## 2012-03-07 DIAGNOSIS — I200 Unstable angina: Secondary | ICD-10-CM | POA: Insufficient documentation

## 2012-03-07 DIAGNOSIS — Z9861 Coronary angioplasty status: Secondary | ICD-10-CM | POA: Insufficient documentation

## 2012-03-07 DIAGNOSIS — E669 Obesity, unspecified: Secondary | ICD-10-CM | POA: Insufficient documentation

## 2012-03-07 DIAGNOSIS — Z87891 Personal history of nicotine dependence: Secondary | ICD-10-CM | POA: Insufficient documentation

## 2012-03-09 ENCOUNTER — Encounter (HOSPITAL_COMMUNITY)
Admission: RE | Admit: 2012-03-09 | Discharge: 2012-03-09 | Disposition: A | Discharge status: Home / Self Care | Payer: Medicare Other | Source: Ambulatory Visit | Attending: Cardiology | Admitting: Cardiology

## 2012-03-11 ENCOUNTER — Encounter (HOSPITAL_COMMUNITY)
Admission: RE | Admit: 2012-03-11 | Discharge: 2012-03-11 | Disposition: A | Discharge status: Home / Self Care | Payer: Medicare Other | Source: Ambulatory Visit | Attending: Cardiology | Admitting: Cardiology

## 2012-03-14 ENCOUNTER — Encounter (HOSPITAL_COMMUNITY)
Admission: RE | Admit: 2012-03-14 | Discharge: 2012-03-14 | Disposition: A | Discharge status: Home / Self Care | Payer: Medicare Other | Source: Ambulatory Visit | Attending: Cardiology | Admitting: Cardiology

## 2012-03-16 ENCOUNTER — Encounter (HOSPITAL_COMMUNITY): Payer: Medicare Other

## 2012-03-18 ENCOUNTER — Encounter (HOSPITAL_COMMUNITY): Admission: RE | Admit: 2012-03-18 | Payer: Medicare Other | Source: Ambulatory Visit

## 2012-03-21 ENCOUNTER — Encounter (HOSPITAL_COMMUNITY)
Admission: RE | Admit: 2012-03-21 | Discharge: 2012-03-21 | Disposition: A | Discharge status: Home / Self Care | Payer: Medicare Other | Source: Ambulatory Visit | Attending: Cardiology | Admitting: Cardiology

## 2012-03-23 ENCOUNTER — Encounter (HOSPITAL_COMMUNITY)
Admission: RE | Admit: 2012-03-23 | Discharge: 2012-03-23 | Disposition: A | Discharge status: Home / Self Care | Payer: Medicare Other | Source: Ambulatory Visit | Attending: Cardiology | Admitting: Cardiology

## 2012-03-25 ENCOUNTER — Encounter (HOSPITAL_COMMUNITY)
Admission: RE | Admit: 2012-03-25 | Discharge: 2012-03-25 | Disposition: A | Discharge status: Home / Self Care | Payer: Medicare Other | Source: Ambulatory Visit | Attending: Cardiology | Admitting: Cardiology

## 2012-03-25 LAB — GLUCOSE, CAPILLARY: Glucose-Capillary: 190 mg/dL — ABNORMAL HIGH (ref 70–99)

## 2012-03-28 ENCOUNTER — Encounter (HOSPITAL_COMMUNITY)
Admission: RE | Admit: 2012-03-28 | Discharge: 2012-03-28 | Disposition: A | Discharge status: Home / Self Care | Payer: Medicare Other | Source: Ambulatory Visit | Attending: Cardiology | Admitting: Cardiology

## 2012-03-28 LAB — GLUCOSE, CAPILLARY
Glucose-Capillary: 103 mg/dL — ABNORMAL HIGH (ref 70–99)
Glucose-Capillary: 95 mg/dL (ref 70–99)

## 2012-03-30 ENCOUNTER — Encounter (HOSPITAL_COMMUNITY): Payer: Medicare Other

## 2012-04-01 ENCOUNTER — Encounter (HOSPITAL_COMMUNITY)
Admission: RE | Admit: 2012-04-01 | Discharge: 2012-04-01 | Disposition: A | Discharge status: Home / Self Care | Payer: Medicare Other | Source: Ambulatory Visit | Attending: Cardiology | Admitting: Cardiology

## 2012-04-01 LAB — GLUCOSE, CAPILLARY: Glucose-Capillary: 194 mg/dL — ABNORMAL HIGH (ref 70–99)

## 2012-04-04 ENCOUNTER — Encounter (HOSPITAL_COMMUNITY)
Admission: RE | Admit: 2012-04-04 | Discharge: 2012-04-04 | Disposition: A | Discharge status: Home / Self Care | Payer: Medicare Other | Source: Ambulatory Visit | Attending: Cardiology | Admitting: Cardiology

## 2012-04-04 DIAGNOSIS — I498 Other specified cardiac arrhythmias: Secondary | ICD-10-CM | POA: Insufficient documentation

## 2012-04-04 DIAGNOSIS — Z9861 Coronary angioplasty status: Secondary | ICD-10-CM | POA: Insufficient documentation

## 2012-04-04 DIAGNOSIS — Z5189 Encounter for other specified aftercare: Secondary | ICD-10-CM | POA: Insufficient documentation

## 2012-04-04 DIAGNOSIS — E119 Type 2 diabetes mellitus without complications: Secondary | ICD-10-CM | POA: Insufficient documentation

## 2012-04-04 DIAGNOSIS — I129 Hypertensive chronic kidney disease with stage 1 through stage 4 chronic kidney disease, or unspecified chronic kidney disease: Secondary | ICD-10-CM | POA: Insufficient documentation

## 2012-04-04 DIAGNOSIS — I2510 Atherosclerotic heart disease of native coronary artery without angina pectoris: Secondary | ICD-10-CM | POA: Insufficient documentation

## 2012-04-04 DIAGNOSIS — Z87891 Personal history of nicotine dependence: Secondary | ICD-10-CM | POA: Insufficient documentation

## 2012-04-04 DIAGNOSIS — E669 Obesity, unspecified: Secondary | ICD-10-CM | POA: Insufficient documentation

## 2012-04-04 DIAGNOSIS — E785 Hyperlipidemia, unspecified: Secondary | ICD-10-CM | POA: Insufficient documentation

## 2012-04-04 DIAGNOSIS — Z951 Presence of aortocoronary bypass graft: Secondary | ICD-10-CM | POA: Insufficient documentation

## 2012-04-04 DIAGNOSIS — I200 Unstable angina: Secondary | ICD-10-CM | POA: Insufficient documentation

## 2012-04-04 DIAGNOSIS — I4891 Unspecified atrial fibrillation: Secondary | ICD-10-CM | POA: Insufficient documentation

## 2012-04-04 DIAGNOSIS — F411 Generalized anxiety disorder: Secondary | ICD-10-CM | POA: Insufficient documentation

## 2012-04-04 DIAGNOSIS — N189 Chronic kidney disease, unspecified: Secondary | ICD-10-CM | POA: Insufficient documentation

## 2012-04-04 DIAGNOSIS — K219 Gastro-esophageal reflux disease without esophagitis: Secondary | ICD-10-CM | POA: Insufficient documentation

## 2012-04-06 ENCOUNTER — Encounter (HOSPITAL_COMMUNITY)
Admission: RE | Admit: 2012-04-06 | Discharge: 2012-04-06 | Disposition: A | Discharge status: Home / Self Care | Payer: Medicare Other | Source: Ambulatory Visit | Attending: Cardiology | Admitting: Cardiology

## 2012-04-08 ENCOUNTER — Encounter (HOSPITAL_COMMUNITY)
Admission: RE | Admit: 2012-04-08 | Discharge: 2012-04-08 | Disposition: A | Discharge status: Home / Self Care | Payer: Medicare Other | Source: Ambulatory Visit | Attending: Cardiology | Admitting: Cardiology

## 2012-04-08 NOTE — Progress Notes (Signed)
Cody Thomas 79 y.o. male Nutrition Note Spoke with pt. Pt known to this Clinical research associate from previous rehab admission. Nutrition Plan and Nutrition Survey goals reviewed with pt. Per MEDFICTS score when pt initially started rehab, there were many areas that needed improvement in pt diet. Per discussion with pt, pt is following Step 1 of the Therapeutic Lifestyle Changes diet. Pt reports he wants to lose wt. Pt denies making any changes to his diet/lifestyle to help promote wt loss. Pt reports wt today 89.3 kg, which is up 3.6 kg (7.9 lb) since admission.  Pt wt appears to be returning to pt's UBW. Pt is diabetic. Last A1c indicates blood glucose not well-controlled. Pt states he has started to see an endocrinologist, Dr. Lucianne Muss, and his last A1c in January was 6.8. Pt reports he has seen Dr. Remus Blake RD 2 times, which has helped with dietary changes needed. This Clinical research associate went over Diabetes Education test results. Pt alternates CBG checks before meals. Pt checks CBG's "2 or more" times daily. Pt reports Dr. Lucianne Muss has requested pt check his CBG's daily. Pre-meal CBG's reportedly run from 99-140 mg/dL. Age-appropriate recommended CBG ranges reviewed with pt. Pt expressed understanding of the above information reviewed. Pt aware of nutrition education classes offered and pt states he attended the nutrition classes in 2009. Pt declined Nutrition Class handouts at this time. Wt Readings from Last 3 Encounters:  01/07/12 188 lb 15 oz (85.7 kg)  12/23/11 188 lb (85.276 kg)  11/25/11 193 lb (87.544 kg)   Nutrition Diagnosis   Food-and nutrition-related knowledge deficit related to lack of exposure to information as related to diagnosis of: ? CVD ? DM   Obesity related to excessive energy intake as evidenced by a BMI of 31.0  Nutrition RX/ Re-Estimated Daily Nutrition Needs for: wt maintenance 2000-2300 Kcal, 65-75 gm fat, 13-15 gm sat fat, 2.0-2.3 gm trans-fat, <1500 mg sodium , 250 gm CHO   Nutrition Intervention   Pt's individual nutrition plan including cholesterol goals reviewed with pt.   Benefits of adopting Therapeutic Lifestyle Changes discussed when Medficts reviewed.   Pt to attend the Portion Distortion class   Continue client-centered nutrition education by RD, as part of interdisciplinary care. Goal(s)   Pt to identify and limit food sources of saturated fat, trans fat, and cholesterol   CBG concentrations in the normal range or as close to normal as is safely possible. Monitor and Evaluate progress toward nutrition goal with team. Nutrition Risk: Change to Moderate

## 2012-04-11 ENCOUNTER — Encounter (HOSPITAL_COMMUNITY)
Admission: RE | Admit: 2012-04-11 | Discharge: 2012-04-11 | Disposition: A | Discharge status: Home / Self Care | Payer: Medicare Other | Source: Ambulatory Visit | Attending: Cardiology | Admitting: Cardiology

## 2012-04-13 ENCOUNTER — Encounter (HOSPITAL_COMMUNITY)
Admission: RE | Admit: 2012-04-13 | Discharge: 2012-04-13 | Disposition: A | Discharge status: Home / Self Care | Payer: Medicare Other | Source: Ambulatory Visit | Attending: Cardiology | Admitting: Cardiology

## 2012-04-15 ENCOUNTER — Encounter (HOSPITAL_COMMUNITY): Payer: Medicare Other

## 2012-04-18 ENCOUNTER — Encounter (HOSPITAL_COMMUNITY)
Admission: RE | Admit: 2012-04-18 | Discharge: 2012-04-18 | Disposition: A | Discharge status: Home / Self Care | Payer: Medicare Other | Source: Ambulatory Visit | Attending: Cardiology | Admitting: Cardiology

## 2012-04-20 ENCOUNTER — Encounter (HOSPITAL_COMMUNITY)
Admission: RE | Admit: 2012-04-20 | Discharge: 2012-04-20 | Disposition: A | Discharge status: Home / Self Care | Payer: Medicare Other | Source: Ambulatory Visit | Attending: Cardiology | Admitting: Cardiology

## 2012-04-20 LAB — GLUCOSE, CAPILLARY: Glucose-Capillary: 111 mg/dL — ABNORMAL HIGH (ref 70–99)

## 2012-04-22 ENCOUNTER — Encounter (HOSPITAL_COMMUNITY): Payer: Medicare Other

## 2012-04-25 ENCOUNTER — Encounter (HOSPITAL_COMMUNITY): Payer: Medicare Other

## 2012-04-27 ENCOUNTER — Encounter (HOSPITAL_COMMUNITY): Payer: Medicare Other

## 2012-04-29 ENCOUNTER — Encounter (HOSPITAL_COMMUNITY): Payer: Medicare Other

## 2012-05-02 ENCOUNTER — Encounter (HOSPITAL_COMMUNITY)
Admission: RE | Admit: 2012-05-02 | Discharge: 2012-05-02 | Disposition: A | Discharge status: Home / Self Care | Payer: Medicare Other | Source: Ambulatory Visit | Attending: Cardiology | Admitting: Cardiology

## 2012-05-02 DIAGNOSIS — I200 Unstable angina: Secondary | ICD-10-CM | POA: Insufficient documentation

## 2012-05-02 DIAGNOSIS — E669 Obesity, unspecified: Secondary | ICD-10-CM | POA: Insufficient documentation

## 2012-05-02 DIAGNOSIS — Z9861 Coronary angioplasty status: Secondary | ICD-10-CM | POA: Insufficient documentation

## 2012-05-02 DIAGNOSIS — E119 Type 2 diabetes mellitus without complications: Secondary | ICD-10-CM | POA: Insufficient documentation

## 2012-05-02 DIAGNOSIS — I4891 Unspecified atrial fibrillation: Secondary | ICD-10-CM | POA: Insufficient documentation

## 2012-05-02 DIAGNOSIS — Z5189 Encounter for other specified aftercare: Secondary | ICD-10-CM | POA: Insufficient documentation

## 2012-05-02 DIAGNOSIS — Z951 Presence of aortocoronary bypass graft: Secondary | ICD-10-CM | POA: Insufficient documentation

## 2012-05-02 DIAGNOSIS — F411 Generalized anxiety disorder: Secondary | ICD-10-CM | POA: Insufficient documentation

## 2012-05-02 DIAGNOSIS — K219 Gastro-esophageal reflux disease without esophagitis: Secondary | ICD-10-CM | POA: Insufficient documentation

## 2012-05-02 DIAGNOSIS — I498 Other specified cardiac arrhythmias: Secondary | ICD-10-CM | POA: Insufficient documentation

## 2012-05-02 DIAGNOSIS — I2510 Atherosclerotic heart disease of native coronary artery without angina pectoris: Secondary | ICD-10-CM | POA: Insufficient documentation

## 2012-05-02 DIAGNOSIS — E785 Hyperlipidemia, unspecified: Secondary | ICD-10-CM | POA: Insufficient documentation

## 2012-05-02 DIAGNOSIS — Z87891 Personal history of nicotine dependence: Secondary | ICD-10-CM | POA: Insufficient documentation

## 2012-05-02 DIAGNOSIS — I129 Hypertensive chronic kidney disease with stage 1 through stage 4 chronic kidney disease, or unspecified chronic kidney disease: Secondary | ICD-10-CM | POA: Insufficient documentation

## 2012-05-02 DIAGNOSIS — N189 Chronic kidney disease, unspecified: Secondary | ICD-10-CM | POA: Insufficient documentation

## 2012-05-04 ENCOUNTER — Encounter (HOSPITAL_COMMUNITY)
Admission: RE | Admit: 2012-05-04 | Discharge: 2012-05-04 | Disposition: A | Discharge status: Home / Self Care | Payer: Medicare Other | Source: Ambulatory Visit | Attending: Cardiology | Admitting: Cardiology

## 2012-05-06 ENCOUNTER — Encounter (HOSPITAL_COMMUNITY): Payer: Medicare Other

## 2012-05-09 ENCOUNTER — Encounter (HOSPITAL_COMMUNITY)
Admission: RE | Admit: 2012-05-09 | Discharge: 2012-05-09 | Disposition: A | Discharge status: Home / Self Care | Payer: Medicare Other | Source: Ambulatory Visit | Attending: Cardiology | Admitting: Cardiology

## 2012-05-11 ENCOUNTER — Encounter (HOSPITAL_COMMUNITY): Payer: Medicare Other

## 2012-05-13 ENCOUNTER — Encounter (HOSPITAL_COMMUNITY)
Admission: RE | Admit: 2012-05-13 | Discharge: 2012-05-13 | Disposition: A | Discharge status: Home / Self Care | Payer: Medicare Other | Source: Ambulatory Visit | Attending: Cardiology | Admitting: Cardiology

## 2012-05-16 ENCOUNTER — Encounter (HOSPITAL_COMMUNITY)
Admission: RE | Admit: 2012-05-16 | Discharge: 2012-05-16 | Disposition: A | Discharge status: Home / Self Care | Payer: Medicare Other | Source: Ambulatory Visit | Attending: Cardiology | Admitting: Cardiology

## 2012-05-16 NOTE — Progress Notes (Signed)
Cody Thomas graduates on Wednesday.   Cody Thomas plans to continue exercise at [employer] and walking on his own.

## 2012-05-18 ENCOUNTER — Encounter (HOSPITAL_COMMUNITY)
Admission: RE | Admit: 2012-05-18 | Discharge: 2012-05-18 | Disposition: A | Discharge status: Home / Self Care | Payer: Medicare Other | Source: Ambulatory Visit | Attending: Cardiology | Admitting: Cardiology

## 2012-05-20 ENCOUNTER — Encounter (HOSPITAL_COMMUNITY): Payer: Medicare Other

## 2012-10-05 DIAGNOSIS — B351 Tinea unguium: Secondary | ICD-10-CM

## 2012-10-05 HISTORY — DX: Tinea unguium: B35.1

## 2012-10-20 ENCOUNTER — Encounter: Payer: Self-pay | Admitting: Endocrinology

## 2012-10-20 ENCOUNTER — Ambulatory Visit (INDEPENDENT_AMBULATORY_CARE_PROVIDER_SITE_OTHER): Payer: Medicare Other | Admitting: Endocrinology

## 2012-10-20 VITALS — BP 110/68 | HR 61 | Temp 98.7°F | Resp 12 | Ht 65.0 in | Wt 198.3 lb

## 2012-10-20 DIAGNOSIS — E785 Hyperlipidemia, unspecified: Secondary | ICD-10-CM

## 2012-10-20 DIAGNOSIS — E119 Type 2 diabetes mellitus without complications: Secondary | ICD-10-CM | POA: Insufficient documentation

## 2012-10-20 DIAGNOSIS — I10 Essential (primary) hypertension: Secondary | ICD-10-CM

## 2012-10-20 LAB — COMPREHENSIVE METABOLIC PANEL
ALT: 18 U/L (ref 0–53)
AST: 19 U/L (ref 0–37)
Albumin: 4.1 g/dL (ref 3.5–5.2)
Alkaline Phosphatase: 41 U/L (ref 39–117)
BUN: 17 mg/dL (ref 6–23)
CO2: 29 meq/L (ref 19–32)
Calcium: 9.7 mg/dL (ref 8.4–10.5)
Chloride: 104 meq/L (ref 96–112)
Creatinine, Ser: 1.4 mg/dL (ref 0.4–1.5)
GFR: 52.25 mL/min — ABNORMAL LOW (ref >60.00)
Glucose, Bld: 103 mg/dL — ABNORMAL HIGH (ref 70–99)
Potassium: 4 meq/L (ref 3.5–5.1)
Sodium: 138 mEq/L (ref 135–145)
Total Bilirubin: 1.2 mg/dL (ref 0.3–1.2)
Total Protein: 7.8 g/dL (ref 6.0–8.3)

## 2012-10-20 LAB — URINALYSIS, ROUTINE W REFLEX MICROSCOPIC
Bilirubin Urine: NEGATIVE
Hgb urine dipstick: NEGATIVE
Ketones, ur: NEGATIVE
Leukocytes, UA: NEGATIVE
Nitrite: NEGATIVE
Specific Gravity, Urine: 1.015 (ref 1.000–1.030)
Total Protein, Urine: NEGATIVE
Urine Glucose: NEGATIVE
Urobilinogen, UA: 0.2 (ref 0.0–1.0)
pH: 6.5 (ref 5.0–8.0)

## 2012-10-20 LAB — MICROALBUMIN / CREATININE URINE RATIO
Creatinine,U: 146.5 mg/dL
Microalb Creat Ratio: 0.7 mg/g (ref 0.0–30.0)
Microalb, Ur: 1 mg/dL (ref 0.0–1.9)

## 2012-10-20 LAB — LIPID PANEL
Cholesterol: 188 mg/dL (ref 0–200)
HDL: 30.2 mg/dL — ABNORMAL LOW (ref >39.00)
LDL Cholesterol: 129 mg/dL — ABNORMAL HIGH (ref 0–99)
Total CHOL/HDL Ratio: 6
Triglycerides: 145 mg/dL (ref 0.0–149.0)
VLDL: 29 mg/dL (ref 0.0–40.0)

## 2012-10-20 LAB — HEMOGLOBIN A1C: Hgb A1c MFr Bld: 7 % — ABNORMAL HIGH (ref 4.6–6.5)

## 2012-10-20 NOTE — Patient Instructions (Addendum)
Please check blood sugars at least half the time about 2 hours after any meal and 3x per week on waking up.   Please bring blood sugar monitor to each visit   

## 2012-10-20 NOTE — Progress Notes (Signed)
inPatient ID: Cody Thomas, male   DOB: 30-Apr-1930, 79 y.o.   MRN: [id number]  Cody Thomas is an 79 y.o. male.   Reason for Appointment: Diabetes follow-up   History of Present Illness   Diagnosis: Type 2 DIABETES MELITUS  He has had mild diabetes which has been previously erratic partly because of dietary inconsistency and difficulty with weight loss He has now been on metformin because of renal dysfunction Was given low-dose JANUVIA also  but this was too expensive for him On his last visit he was given low dose Amaryl instead which appears to be working better and has had no hypoglycemia Also has generally been trying to be active and exercise. Recent glucose readings are fairly good            Monitors blood glucose: Once a day.    Glucometer: FreeStyle          Blood Glucose readings from meter download: readings before breakfast:128-144 and nonfasting 134 169.  previously as high as 200   Hypoglycemia frequency: Never.          Meals: 3 meals per day. watching portions and calories better         Physical activity: exercise: silver sneakers, walking             Wt Readings from Last 3 Encounters:  10/20/12 198 lb 4.8 oz (89.948 kg)  01/07/12 188 lb 15 oz (85.7 kg)  12/23/11 188 lb (85.276 kg)       Medication List       This list is accurate as of: 10/20/12 11:59 PM.  Always use your most recent med list.               aspirin 325 MG EC tablet  Take 1 tablet (325 mg total) by mouth daily.     calcium carbonate 500 MG chewable tablet  Commonly known as:  TUMS - dosed in mg elemental calcium  Chew 2 tablets by mouth daily as needed. For indigestion.     ezetimibe 10 MG tablet  Commonly known as:  ZETIA  Take 10 mg by mouth daily.     fish oil-omega-3 fatty acids 1000 MG capsule  Take 2 g by mouth 2 (two) times daily.     glimepiride 1 MG tablet  Commonly known as:  AMARYL  Take 1 mg by mouth daily.     losartan 100 MG tablet  Commonly known as:   COZAAR  Take 100 mg by mouth daily.     metoprolol tartrate 25 MG tablet  Commonly known as:  LOPRESSOR  Take 1 tablet (25 mg total) by mouth 2 (two) times daily.     rosuvastatin 10 MG tablet  Commonly known as:  CRESTOR  Take 10 mg by mouth 3 (three) times a week.        Allergies:  Allergies  Allergen Reactions  . Crestor [Rosuvastatin]     Nightmares in high dosage  . Zocor [Simvastatin]     Nightmares in high dosage    Past Medical History  Diagnosis Date  . Hyperlipidemia     Crestor 3 x wk and Zetia daily  . Hx of cardiac cath   . Coronary atherosclerosis of native coronary artery     2009 LAD CIRC DES  . Chronic kidney disease     stage 111  . PONV (postoperative nausea and vomiting)     pt states extremely sick  . Hypertension  takes Hyzaar daily  . Shortness of breath     with exertion  . Headache(784.0)   . Dizziness   . Joint pain   . History of gout     doesn't require meds  . Cancer     nose/Dr. Albertini;skin  . H/O hiatal hernia   . GERD (gastroesophageal reflux disease)     TUMS prn  . Chronic constipation   . Enlarged prostate     but doesn't require meds at present  . Diabetes mellitus     takes Januvia daily  . Anxiety   . Depression     doesn't take any meds for this  . Insomnia     doesn't require meds at present time    Past Surgical History  Procedure Laterality Date  . Cholecystectomy    . Eye surgery      bilateral cataracts  . Hemorrhoid surgery    . Rotator cuff surgery      right   . Hernia repair      x 2;inguinal  . Cyst removed from finger      right hand  . Cataracts removed    . Skin cancer removed    . Cardiac catheterization  2013  . Coronary angioplasty      2 stents from 2009  . Colonoscopy    . Coronary artery bypass graft  10/29/2011    Procedure: CORONARY ARTERY BYPASS GRAFTING (CABG);  Surgeon: Kerin Perna, MD;  Location: [facility] OR;  Service: Open Heart Surgery;  Laterality: N/A;    Family  History  Problem Relation Age of Onset  . Stroke Father   . Cancer Brother     Social History:  reports that he has never smoked. He has never used smokeless tobacco. He reports that he does not drink alcohol or use illicit drugs.  Review of Systems:  HYPERTENSION:  well-controlled  HYPERLIPIDEMIA: The lipid abnormality consists of elevated LDL treated with 2 drugs but only low dose Crestor.     Examination:   BP 110/68  Pulse 61  Temp(Src) 98.7 F (37.1 C)  Resp 12  Ht 5\' 5"  (1.651 m)  Wt 198 lb 4.8 oz (89.948 kg)  BMI 33 kg/m2  SpO2 94%  Body mass index is 33 kg/(m^2).   ASSESSMENT/ PLAN::   Diabetes type 2   The patient's diabetes control appears to be better with his  improving his lifestyle especially recently, now using only Amaryl without hypoglycemia with this and will continue. We'll also check A1c and other labs today   [facility] 10/26/2012, 9:11 PM   Office Visit on 10/20/2012  Component Date Value Range Status  . Sodium 10/20/2012 138  135 - 145 mEq/L Final  . Potassium 10/20/2012 4.0  3.5 - 5.1 mEq/L Final  . Chloride 10/20/2012 104  96 - 112 mEq/L Final  . CO2 10/20/2012 29  19 - 32 mEq/L Final  . Glucose, Bld 10/20/2012 103* 70 - 99 mg/dL Final  . BUN 16/12/9602 17  6 - 23 mg/dL Final  . Creatinine, Ser 10/20/2012 1.4  0.4 - 1.5 mg/dL Final  . Total Bilirubin 10/20/2012 1.2  0.3 - 1.2 mg/dL Final  . Alkaline Phosphatase 10/20/2012 41  39 - 117 U/L Final  . AST 10/20/2012 19  0 - 37 U/L Final  . ALT 10/20/2012 18  0 - 53 U/L Final  . Total Protein 10/20/2012 7.8  6.0 - 8.3 g/dL Final  . Albumin 54/10/8117 4.1  3.5 -  5.2 g/dL Final  . Calcium 40/98/1191 9.7  8.4 - 10.5 mg/dL Final  . GFR 47/82/9562 52.25* >60.00 mL/min Final  . Hemoglobin A1C 10/20/2012 7.0* 4.6 - 6.5 % Final   Glycemic Control Guidelines for People with Diabetes:Non Diabetic:  <6%Goal of Therapy: <7%Additional Action Suggested:  >8%   . Cholesterol 10/20/2012 188  0 - 200 mg/dL  Final   ATP III Classification       Desirable:  < 200 mg/dL               Borderline High:  200 - 239 mg/dL          High:  > = 130 mg/dL  . Triglycerides 10/20/2012 145.0  0.0 - 149.0 mg/dL Final   Normal:  <865 mg/dLBorderline High:  150 - 199 mg/dL  . HDL 10/20/2012 30.20* >39.00 mg/dL Final  . VLDL 78/46/9629 29.0  0.0 - 40.0 mg/dL Final  . LDL Cholesterol 10/20/2012 129* 0 - 99 mg/dL Final  . Total CHOL/HDL Ratio 10/20/2012 6   Final                  Men          Women1/2 Average Risk     3.4          3.3Average Risk          5.0          4.42X Average Risk          9.6          7.13X Average Risk          15.0          11.0                      . Microalb, Ur 10/20/2012 1.0  0.0 - 1.9 mg/dL Final  . Creatinine,U 52/84/1324 146.5   Final  . Microalb Creat Ratio 10/20/2012 0.7  0.0 - 30.0 mg/g Final  . Color, Urine 10/20/2012 LT. YELLOW  Yellow;Lt. Yellow Final  . APPearance 10/20/2012 CLEAR  Clear Final  . Specific Gravity, Urine 10/20/2012 1.015  1.000-1.030 Final  . pH 10/20/2012 6.5  5.0 - 8.0 Final  . Total Protein, Urine 10/20/2012 NEGATIVE  Negative Final  . Urine Glucose 10/20/2012 NEGATIVE  Negative Final  . Ketones, ur 10/20/2012 NEGATIVE  Negative Final  . Bilirubin Urine 10/20/2012 NEGATIVE  Negative Final  . Hgb urine dipstick 10/20/2012 NEGATIVE  Negative Final  . Urobilinogen, UA 10/20/2012 0.2  0.0 - 1.0 Final  . Leukocytes, UA 10/20/2012 NEGATIVE  Negative Final  . Nitrite 10/20/2012 NEGATIVE  Negative Final  . WBC, UA 10/20/2012 0-2/hpf  0-2/hpf Final  . RBC / HPF 10/20/2012 0-2/hpf  0-2/hpf Final  . Squamous Epithelial / LPF 10/20/2012 Rare(0-4/hpf)  Rare(0-4/hpf) Final

## 2012-11-14 ENCOUNTER — Encounter: Payer: Self-pay | Admitting: Podiatry

## 2012-11-14 ENCOUNTER — Telehealth: Payer: Self-pay | Admitting: Endocrinology

## 2012-11-14 NOTE — Telephone Encounter (Signed)
Okay to fax patient his lab results?

## 2012-11-14 NOTE — Telephone Encounter (Signed)
Labs faxed

## 2012-11-14 NOTE — Telephone Encounter (Signed)
ok 

## 2012-11-26 ENCOUNTER — Other Ambulatory Visit: Payer: Self-pay | Admitting: Cardiology

## 2012-11-26 DIAGNOSIS — Z79899 Other long term (current) drug therapy: Secondary | ICD-10-CM

## 2012-11-26 DIAGNOSIS — E7800 Pure hypercholesterolemia, unspecified: Secondary | ICD-10-CM

## 2012-12-05 ENCOUNTER — Other Ambulatory Visit (INDEPENDENT_AMBULATORY_CARE_PROVIDER_SITE_OTHER): Payer: Medicare Other

## 2012-12-05 DIAGNOSIS — Z79899 Other long term (current) drug therapy: Secondary | ICD-10-CM

## 2012-12-05 DIAGNOSIS — E7800 Pure hypercholesterolemia, unspecified: Secondary | ICD-10-CM

## 2012-12-05 LAB — LIPID PANEL
Cholesterol: 120 mg/dL (ref 0–200)
HDL: 34.3 mg/dL — ABNORMAL LOW (ref >39.00)
LDL Cholesterol: 65 mg/dL (ref 0–99)
Total CHOL/HDL Ratio: 3
Triglycerides: 106 mg/dL (ref 0.0–149.0)
VLDL: 21.2 mg/dL (ref 0.0–40.0)

## 2012-12-05 LAB — ALT: ALT: 18 U/L (ref 0–53)

## 2013-01-04 ENCOUNTER — Ambulatory Visit (INDEPENDENT_AMBULATORY_CARE_PROVIDER_SITE_OTHER): Payer: Medicare Other | Admitting: Podiatry

## 2013-01-04 ENCOUNTER — Encounter: Payer: Self-pay | Admitting: Podiatry

## 2013-01-04 VITALS — BP 152/85 | HR 66 | Resp 16 | Ht 67.0 in | Wt 205.0 lb

## 2013-01-04 DIAGNOSIS — B351 Tinea unguium: Secondary | ICD-10-CM

## 2013-01-04 DIAGNOSIS — M79609 Pain in unspecified limb: Secondary | ICD-10-CM

## 2013-01-04 NOTE — Progress Notes (Signed)
Patient ID: Cody Thomas, male   DOB: 05/05/1930, 79 y.o.   MRN: [id number]  Subjective: 79 year old white male known diabetic presents for ongoing debridement of painful mycotic toenails  Objective: Yellow brittle thickened toenails with texture color changes x10. Nail plates are tender to palpation.  Assessment: Symptomatic onychomycosis x10  Plan: Debridement of painful mycotic toenails without any bleeding. Reappoint at three-month intervals.  Richard C.Leeanne Deed, DPM

## 2013-01-19 ENCOUNTER — Ambulatory Visit (INDEPENDENT_AMBULATORY_CARE_PROVIDER_SITE_OTHER): Payer: Medicare Other | Admitting: Endocrinology

## 2013-01-19 ENCOUNTER — Telehealth: Payer: Self-pay | Admitting: *Deleted

## 2013-01-19 ENCOUNTER — Encounter: Payer: Self-pay | Admitting: Endocrinology

## 2013-01-19 VITALS — BP 110/60 | HR 68 | Temp 98.3°F | Resp 12 | Ht 65.5 in | Wt 199.5 lb

## 2013-01-19 DIAGNOSIS — M109 Gout, unspecified: Secondary | ICD-10-CM

## 2013-01-19 DIAGNOSIS — E119 Type 2 diabetes mellitus without complications: Secondary | ICD-10-CM

## 2013-01-19 DIAGNOSIS — Z23 Encounter for immunization: Secondary | ICD-10-CM

## 2013-01-19 DIAGNOSIS — E785 Hyperlipidemia, unspecified: Secondary | ICD-10-CM

## 2013-01-19 LAB — BASIC METABOLIC PANEL
BUN: 22 mg/dL (ref 6–23)
CO2: 28 mEq/L (ref 19–32)
Calcium: 9.6 mg/dL (ref 8.4–10.5)
Chloride: 100 meq/L (ref 96–112)
Creatinine, Ser: 1.5 mg/dL (ref 0.4–1.5)
GFR: 46.39 mL/min — ABNORMAL LOW (ref >60.00)
Glucose, Bld: 167 mg/dL — ABNORMAL HIGH (ref 70–99)
Potassium: 4.2 meq/L (ref 3.5–5.1)
Sodium: 135 mEq/L (ref 135–145)

## 2013-01-19 LAB — HEMOGLOBIN A1C: Hgb A1c MFr Bld: 7.1 % — ABNORMAL HIGH (ref 4.6–6.5)

## 2013-01-19 NOTE — Patient Instructions (Signed)
Please check blood sugars at least half the time about 2 hours after any meal and 2-3x per week on waking up. Please bring blood sugar monitor to each visit

## 2013-01-19 NOTE — Progress Notes (Signed)
inPatient ID: Cody Thomas, male   DOB: November 18, 1930, 79 y.o.   MRN: [id number]  Cody Thomas is an 79 y.o. male.   Reason for Appointment: Diabetes follow-up   History of Present Illness   Diagnosis: Type 2 DIABETES MELITUS  He has had mild diabetes which has been previously erratic partly because of dietary inconsistency and difficulty with weight loss He has not been continued on metformin because of renal dysfunction Was given low-dose JANUVIA also  but this was too expensive for him In 2014 he was given low dose Amaryl instead which appears to be keeping his blood sugars controlled and has had no hypoglycemia Also has generally been trying to be active with some exercise.  Recent glucose readings are fairly good but is monitoring mostly in the morning            Monitors blood glucose: Once a day.    Glucometer: FreeStyle          Blood Glucose readings from meter download: readings before breakfast: 127-156, after supper or 137, 178 and 186   Hypoglycemia frequency:  none.          Meals: 3 meals per day.  has been inconsistent with diet until recently when he started watching portions better and weight is starting to improve      Physical activity: exercise: Programmed with silver sneakers which includes some aerobic activity            Wt Readings from Last 3 Encounters:  01/19/13 199 lb 8 oz (90.493 kg)  01/04/13 205 lb (92.987 kg)  10/20/12 198 lb 4.8 oz (89.948 kg)   DM LABS:  Lab Results  Component Value Date   HGBA1C 7.1* 01/19/2013   HGBA1C 7.0* 10/20/2012   HGBA1C 8.2* 10/27/2011   Lab Results  Component Value Date   MICROALBUR 1.0 10/20/2012   LDLCALC 65 12/05/2012   CREATININE 1.5 01/19/2013       Medication List       This list is accurate as of: 01/19/13  9:03 PM.  Always use your most recent med list.               calcium carbonate 500 MG chewable tablet  Commonly known as:  TUMS - dosed in mg elemental calcium  Chew 2 tablets by mouth  daily as needed. For indigestion.     ezetimibe 10 MG tablet  Commonly known as:  ZETIA  Take 10 mg by mouth daily.     fish oil-omega-3 fatty acids 1000 MG capsule  Take 2 g by mouth 2 (two) times daily.     glimepiride 1 MG tablet  Commonly known as:  AMARYL  Take 1 mg by mouth daily.     losartan-hydrochlorothiazide 100-25 MG per tablet  Commonly known as:  HYZAAR  Take 1 tablet by mouth daily.     losartan-hydrochlorothiazide 100-25 MG per tablet  Commonly known as:  HYZAAR  Take 1 tablet by mouth daily.     metoprolol tartrate 25 MG tablet  Commonly known as:  LOPRESSOR  Take 1 tablet (25 mg total) by mouth 2 (two) times daily.     rosuvastatin 10 MG tablet  Commonly known as:  CRESTOR  Take 10 mg by mouth 3 (three) times a week.        Allergies:  Allergies  Allergen Reactions  . Crestor [Rosuvastatin]     Nightmares in high dosage  . Zocor [Simvastatin]  Nightmares in high dosage    Past Medical History  Diagnosis Date  . Hyperlipidemia     Crestor 3 x wk and Zetia daily  . Hx of cardiac cath   . Coronary atherosclerosis of native coronary artery     2009 LAD CIRC DES  . Chronic kidney disease     stage 111  . PONV (postoperative nausea and vomiting)     pt states extremely sick  . Hypertension     takes Hyzaar daily  . Shortness of breath     with exertion  . Headache(784.0)   . Dizziness   . Joint pain   . History of gout     doesn't require meds  . Cancer     nose/Dr. Albertini;skin  . H/O hiatal hernia   . GERD (gastroesophageal reflux disease)     TUMS prn  . Chronic constipation   . Enlarged prostate     but doesn't require meds at present  . Diabetes mellitus     takes Januvia daily  . Anxiety   . Depression     doesn't take any meds for this  . Insomnia     doesn't require meds at present time  . Onychomycosis 10/05/2012    x 10  . Bunion 09/29/2011    Past Surgical History  Procedure Laterality Date  .  Cholecystectomy    . Eye surgery      bilateral cataracts  . Hemorrhoid surgery    . Rotator cuff surgery      right   . Hernia repair      x 2;inguinal  . Cyst removed from finger      right hand  . Cataracts removed    . Skin cancer removed    . Cardiac catheterization  2013  . Coronary angioplasty      2 stents from 2009  . Colonoscopy    . Coronary artery bypass graft  10/29/2011    Procedure: CORONARY ARTERY BYPASS GRAFTING (CABG);  Surgeon: Kerin Perna, MD;  Location: [facility] OR;  Service: Open Heart Surgery;  Laterality: N/A;    Family History  Problem Relation Age of Onset  . Stroke Father   . Cancer Brother     Social History:  reports that he has quit smoking. He has never used smokeless tobacco. He reports that he does not drink alcohol or use illicit drugs.  Review of Systems:  HYPERTENSION:  well-controlled on losartan HCT   HYPERLIPIDEMIA: The lipid abnormality consists of elevated LDL and low HDL treated with 2 drugs but only 10 mg Crestor. Recently evaluated by cardiologist     Examination:   BP 110/60  Pulse 68  Temp(Src) 98.3 F (36.8 C)  Resp 12  Ht 5' 5.5" (1.664 m)  Wt 199 lb 8 oz (90.493 kg)  BMI 32.68 kg/m2  SpO2 93%  Body mass index is 32.68 kg/(m^2).   ASSESSMENT/ PLAN::   Diabetes type 2   The patient's diabetes control appears to be  fairly good with review of home readings but he has not had tendency to gain weight this summer. Although he has checked only 2 readings after meals they seem to be fairly good and morning readings are only mildly increased. He is now using only Amaryl without hypoglycemia with this and will continue.  Will need to check A1c and other labs today  Lipids monitored by cardiologist  [facility] 01/19/2013, 9:03 PM   Office Visit on 01/19/2013  Component  Date Value Range Status  . Sodium 01/19/2013 135  135 - 145 mEq/L Final  . Potassium 01/19/2013 4.2  3.5 - 5.1 mEq/L Final  . Chloride 01/19/2013 100   96 - 112 mEq/L Final  . CO2 01/19/2013 28  19 - 32 mEq/L Final  . Glucose, Bld 01/19/2013 167* 70 - 99 mg/dL Final  . BUN 78/29/5621 22  6 - 23 mg/dL Final  . Creatinine, Ser 01/19/2013 1.5  0.4 - 1.5 mg/dL Final  . Calcium 30/86/5784 9.6  8.4 - 10.5 mg/dL Final  . GFR 69/62/9528 46.39* >60.00 mL/min Final  . Hemoglobin A1C 01/19/2013 7.1* 4.6 - 6.5 % Final   Glycemic Control Guidelines for People with Diabetes:Non Diabetic:  <6%Goal of Therapy: <7%Additional Action Suggested:  >8%

## 2013-01-19 NOTE — Telephone Encounter (Signed)
When patients labs are ready, he would like them faxed to him at this number [phone]

## 2013-01-19 NOTE — Telephone Encounter (Signed)
ok 

## 2013-01-20 NOTE — Telephone Encounter (Signed)
Ok to fax-

## 2013-01-20 NOTE — Telephone Encounter (Signed)
Labs faxed

## 2013-02-17 ENCOUNTER — Other Ambulatory Visit: Payer: Self-pay

## 2013-02-17 MED ORDER — GLIMEPIRIDE 1 MG PO TABS: 1.0000 mg | ORAL_TABLET | Freq: Every day | ORAL | Status: DC

## 2013-03-28 ENCOUNTER — Telehealth: Payer: Self-pay | Admitting: *Deleted

## 2013-03-28 NOTE — Telephone Encounter (Signed)
Patient requests hyzaar refill to be sent to [facility]. Thanks, MI

## 2013-03-30 LAB — BASIC METABOLIC PANEL: Creatinine: 1.4 mg/dL — AB (ref 0.6–1.3)

## 2013-03-30 LAB — LIPID PANEL: LDL Cholesterol: 84 mg/dL

## 2013-03-30 MED ORDER — LOSARTAN POTASSIUM-HCTZ 100-25 MG PO TABS: 1.0000 | ORAL_TABLET | Freq: Every day | ORAL | Status: DC

## 2013-04-05 ENCOUNTER — Encounter: Payer: Self-pay | Admitting: Podiatry

## 2013-04-05 ENCOUNTER — Ambulatory Visit (INDEPENDENT_AMBULATORY_CARE_PROVIDER_SITE_OTHER): Payer: Medicare Other | Admitting: Podiatry

## 2013-04-05 VITALS — BP 146/68 | HR 72 | Resp 12

## 2013-04-05 DIAGNOSIS — M79609 Pain in unspecified limb: Secondary | ICD-10-CM

## 2013-04-05 DIAGNOSIS — B351 Tinea unguium: Secondary | ICD-10-CM

## 2013-04-05 NOTE — Progress Notes (Signed)
Patient ID: Cody Thomas, male   DOB: Feb 22, 1931, 80 y.o.   MRN: [id number]  Subjective: 80 year old white male known diabetic presents for ongoing debridement of painful mycotic toenails   Objective: Yellow brittle thickened toenails with texture color changes x10. Nail plates are tender to palpation.   Assessment: Symptomatic onychomycosis x10   Plan: Debridement of painful mycotic toenails without any bleeding. Reappoint at three-month intervals.

## 2013-05-10 ENCOUNTER — Encounter: Payer: Self-pay | Admitting: Cardiology

## 2013-05-15 ENCOUNTER — Telehealth: Payer: Self-pay | Admitting: *Deleted

## 2013-05-15 NOTE — Telephone Encounter (Signed)
Patient requests metoprolol refill be sent to [facility]. Thanks, MI

## 2013-05-16 ENCOUNTER — Other Ambulatory Visit: Payer: Self-pay | Admitting: Cardiology

## 2013-05-16 MED ORDER — METOPROLOL TARTRATE 25 MG PO TABS: 25.0000 mg | ORAL_TABLET | Freq: Two times a day (BID) | ORAL | Status: DC

## 2013-05-16 NOTE — Telephone Encounter (Signed)
Refill Metoprolol

## 2013-05-22 ENCOUNTER — Ambulatory Visit (INDEPENDENT_AMBULATORY_CARE_PROVIDER_SITE_OTHER): Payer: Medicare Other | Admitting: Cardiology

## 2013-05-22 ENCOUNTER — Encounter: Payer: Self-pay | Admitting: Cardiology

## 2013-05-22 VITALS — BP 140/94 | HR 68 | Ht 66.0 in | Wt 199.0 lb

## 2013-05-22 DIAGNOSIS — Z9889 Other specified postprocedural states: Secondary | ICD-10-CM

## 2013-05-22 DIAGNOSIS — E119 Type 2 diabetes mellitus without complications: Secondary | ICD-10-CM

## 2013-05-22 DIAGNOSIS — R079 Chest pain, unspecified: Secondary | ICD-10-CM

## 2013-05-22 DIAGNOSIS — E785 Hyperlipidemia, unspecified: Secondary | ICD-10-CM

## 2013-05-22 DIAGNOSIS — I10 Essential (primary) hypertension: Secondary | ICD-10-CM

## 2013-05-22 MED ORDER — ATORVASTATIN CALCIUM 40 MG PO TABS: 40.0000 mg | ORAL_TABLET | Freq: Every day | ORAL | Status: DC

## 2013-05-22 NOTE — Patient Instructions (Signed)
Your physician has recommended you make the following change in your medication:   1. Start Atorvastatin 40 mg once daily.  Your physician recommends that you return for lab work in: 3 months, Lipid, ALT  Your physician recommends that you schedule a follow-up appointment in: 3 months with Dr. Marlou Porch.

## 2013-05-22 NOTE — Progress Notes (Signed)
[facility]. [address] [place], [place]  [address] Phone: [phone] Fax:  [phone]  Date:  05/22/2013   ID:  Cody Thomas, DOB 24-Feb-1931, MRN [id number]  PCP:  Gerrit Heck, MD   History of Present Illness: Cody Thomas is a 80 y.o. male with coronary disease status post 4 vessel bypass surgery 10/29/11 by Dr. Darcey Nora here for followup. He a brief postoperative atrial fibrillation. He still having some irritation in his chest wall post bypass. Otherwise is feeling quite well. He walked mile and a half without much difficulty.   His blood pressure has been much better now that he is on the combination of Losartan/HCTZ.   Had to stop Zetia and Crestor becuase of cost.  Overall no anginal like symptoms.    Wt Readings from Last 3 Encounters:  05/22/13 199 lb (90.266 kg)  01/19/13 199 lb 8 oz (90.493 kg)  01/04/13 205 lb (92.987 kg)     Past Medical History  Diagnosis Date  . Hyperlipidemia     Crestor 3 x wk and Zetia daily  . Hx of cardiac cath   . Coronary atherosclerosis of native coronary artery     2009 LAD CIRC DES  . Chronic kidney disease     stage 111  . PONV (postoperative nausea and vomiting)     pt states extremely sick  . Hypertension     takes Hyzaar daily  . Shortness of breath     with exertion  . Headache(784.0)   . Dizziness   . Joint pain   . History of gout     doesn't require meds  . Cancer     nose/Dr. Albertini;skin  . H/O hiatal hernia   . GERD (gastroesophageal reflux disease)     TUMS prn  . Chronic constipation   . Enlarged prostate     but doesn't require meds at present  . Diabetes mellitus     takes Januvia daily  . Anxiety   . Depression     doesn't take any meds for this  . Insomnia     doesn't require meds at present time  . Onychomycosis 10/05/2012    x 10  . Bunion 09/29/2011    Past Surgical History  Procedure Laterality Date  . Cholecystectomy    . Eye surgery     bilateral cataracts  . Hemorrhoid surgery    . Rotator cuff surgery      right   . Hernia repair      x 2;inguinal  . Cyst removed from finger      right hand  . Cataracts removed    . Skin cancer removed    . Cardiac catheterization  2013  . Coronary angioplasty      2 stents from 2009  . Colonoscopy    . Coronary artery bypass graft  10/29/2011    Procedure: CORONARY ARTERY BYPASS GRAFTING (CABG);  Surgeon: Ivin Poot, MD;  Location: [facility];  Service: Open Heart Surgery;  Laterality: N/A;    Current Outpatient Prescriptions  Medication Sig Dispense Refill  . calcium carbonate (TUMS - DOSED IN MG ELEMENTAL CALCIUM) 500 MG chewable tablet Chew 2 tablets by mouth daily as needed. For indigestion.       . fish oil-omega-3 fatty acids 1000 MG capsule Take 1,200 mg by mouth 2 (two) times daily.       Marland Kitchen glimepiride (AMARYL) 1 MG tablet Take 1 tablet (1  mg total) by mouth daily.  30 tablet  3  . losartan-hydrochlorothiazide (HYZAAR) 100-25 MG per tablet Take 1 tablet by mouth daily.  90 tablet  3  . metoprolol tartrate (LOPRESSOR) 25 MG tablet Take 1 tablet (25 mg total) by mouth 2 (two) times daily.  60 tablet  4   No current facility-administered medications for this visit.    Allergies:    Allergies  Allergen Reactions  . Crestor [Rosuvastatin]     Nightmares in high dosage  . Zocor [Simvastatin]     Nightmares in high dosage    Social History:  The patient  reports that he has quit smoking. He has never used smokeless tobacco. He reports that he does not drink alcohol or use illicit drugs.   ROS:  Please see the history of present illness.   Chest soreness. No CVA. Positive waking. Trouble with dyspnea when bending over.  PHYSICAL EXAM: VS:  BP 140/94  Pulse 68  Ht 5\' 6"  (1.676 m)  Wt 199 lb (90.266 kg)  BMI 32.13 kg/m2 Well nourished, well developed, in no acute distress HEENT: normal Neck: no JVD Cardiac:  normal S1, S2; RRR; no murmur Lungs:  clear to  auscultation bilaterally, no wheezing, rhonchi or rales Abd: soft, nontender, no hepatomegaly Ext: no edema Skin: warm and dry Neuro: no focal abnormalities noted  EKG: None today  ASSESSMENT AND PLAN:  1. Hyperlipidemia-too expensive Crestor. We will try atorvastatin 40 mg once a day. See him back in 3 months, ALT, lipid profile. 2. Hypertension-elevated today. Usually it is in normal range at home. Continue with current medications. He hasn't taken his yet this morning. 3. CAD-stable, post bypass. Still with some irritation left sternal region. 4. Obesity-continue to encourage weight loss. Contributing to his shortness of breath.  Signed, Candee Furbish, MD [facility]  05/22/2013 11:27 AM

## 2013-07-05 ENCOUNTER — Ambulatory Visit (INDEPENDENT_AMBULATORY_CARE_PROVIDER_SITE_OTHER): Payer: Medicare Other | Admitting: Podiatry

## 2013-07-05 ENCOUNTER — Encounter: Payer: Self-pay | Admitting: Podiatry

## 2013-07-05 VITALS — BP 136/80 | HR 60 | Resp 17 | Ht 65.0 in | Wt 199.0 lb

## 2013-07-05 DIAGNOSIS — M79609 Pain in unspecified limb: Secondary | ICD-10-CM

## 2013-07-05 DIAGNOSIS — B351 Tinea unguium: Secondary | ICD-10-CM

## 2013-07-06 NOTE — Progress Notes (Signed)
Patient ID: Cody Thomas, male   DOB: 06/21/1930, 80 y.o.   MRN: [id number]  Subjective: Orientated x3 diabetic male presents requesting debridement of painful mycotic toenails.  Objective: Hypertrophic, elongated, discolored toenails x10  Assessment: Symptomatic onychomycoses x10  Plan: Debridement of all 10 toenails without a bleeding. Reappoint at three-month intervals.

## 2013-07-12 ENCOUNTER — Ambulatory Visit: Payer: Medicare Other

## 2013-07-12 ENCOUNTER — Ambulatory Visit (INDEPENDENT_AMBULATORY_CARE_PROVIDER_SITE_OTHER): Payer: Medicare Other | Admitting: Endocrinology

## 2013-07-12 ENCOUNTER — Encounter: Payer: Self-pay | Admitting: Endocrinology

## 2013-07-12 ENCOUNTER — Other Ambulatory Visit (INDEPENDENT_AMBULATORY_CARE_PROVIDER_SITE_OTHER): Payer: Medicare Other

## 2013-07-12 ENCOUNTER — Other Ambulatory Visit: Payer: Self-pay | Admitting: *Deleted

## 2013-07-12 VITALS — BP 128/72 | HR 63 | Temp 98.1°F | Resp 14 | Ht 65.5 in | Wt 197.6 lb

## 2013-07-12 DIAGNOSIS — E119 Type 2 diabetes mellitus without complications: Secondary | ICD-10-CM

## 2013-07-12 DIAGNOSIS — E785 Hyperlipidemia, unspecified: Secondary | ICD-10-CM

## 2013-07-12 LAB — URINALYSIS, ROUTINE W REFLEX MICROSCOPIC
Bilirubin Urine: NEGATIVE
Hgb urine dipstick: NEGATIVE
Ketones, ur: NEGATIVE
Leukocytes, UA: NEGATIVE
Nitrite: NEGATIVE
RBC / HPF: NONE SEEN (ref 0–?)
Specific Gravity, Urine: 1.015 (ref 1.000–1.030)
Total Protein, Urine: NEGATIVE
Urine Glucose: NEGATIVE
Urobilinogen, UA: 0.2 (ref 0.0–1.0)
pH: 6 (ref 5.0–8.0)

## 2013-07-12 LAB — MICROALBUMIN / CREATININE URINE RATIO
Creatinine,U: 103.4 mg/dL
Microalb Creat Ratio: 0.7 mg/g (ref 0.0–30.0)
Microalb, Ur: 0.7 mg/dL (ref 0.0–1.9)

## 2013-07-12 LAB — HEMOGLOBIN A1C: Hgb A1c MFr Bld: 7 % — ABNORMAL HIGH (ref 4.6–6.5)

## 2013-07-12 MED ORDER — GLIMEPIRIDE 1 MG PO TABS: 1.0000 mg | ORAL_TABLET | Freq: Every day | ORAL | Status: DC

## 2013-07-12 NOTE — Progress Notes (Signed)
Patient ID: Cody Thomas, male   DOB: 07-27-1930, 80 y.o.   MRN: [id number]   Reason for Appointment: Diabetes follow-up   History of Present Illness   Diagnosis: Type 2 DIABETES MELITUS  He has had mild diabetes which has been previously erratic partly because of dietary inconsistency and difficulty with weight loss He has not been continued on metformin because of renal dysfunction Was given low-dose Januvia instead but this was too expensive for him even though it was helping his control In 2014 he was given low dose Amaryl instead which appears to be keeping his blood sugars controlled  He has not been seen in followup since 11/14 Recent glucose readings are fairly good in the morning His blood sugars are sporadically higher after meals based on what he is eating. He thinks they are higher when he is eating cereal or ice cream Has had no recent hypoglycemia Also has generally been trying to be active but not doing any programmed exercise since his last visit            Monitors blood glucose: Once a day.    Glucometer: FreeStyle          Blood Glucose readings from meter download:   PREMEAL Breakfast Lunch Dinner Bedtime Overall  Glucose range:  113-139   140      Mean/median:     151   POST-MEAL PC Breakfast PC Lunch PC Dinner  Glucose range:  139-218   112, 221   131, 220   Mean/median:      Hypoglycemia: none for 2 months, symptoms or shakiness.          Meals: 3 meals per day. Sometimes toast, otherwise cereal. Physical activity: exercise: a little walking or gardening            Wt Readings from Last 3 Encounters:  07/12/13 197 lb 9.6 oz (89.631 kg)  07/05/13 199 lb (90.266 kg)  05/22/13 199 lb (90.266 kg)   DM LABS:  Lab Results  Component Value Date   HGBA1C 7.1* 01/19/2013   HGBA1C 7.0* 10/20/2012   HGBA1C 8.2* 10/27/2011   Lab Results  Component Value Date   MICROALBUR 1.0 10/20/2012   LDLCALC 65 12/05/2012   CREATININE 1.5 01/19/2013        Medication List       This list is accurate as of: 07/12/13 10:18 AM.  Always use your most recent med list.               atorvastatin 40 MG tablet  Commonly known as:  LIPITOR  Take 1 tablet (40 mg total) by mouth daily.     calcium carbonate 500 MG chewable tablet  Commonly known as:  TUMS - dosed in mg elemental calcium  Chew 2 tablets by mouth daily as needed. For indigestion.     fish oil-omega-3 fatty acids 1000 MG capsule  Take 1,200 mg by mouth 2 (two) times daily.     glimepiride 1 MG tablet  Commonly known as:  AMARYL  Take 1 tablet (1 mg total) by mouth daily.     losartan-hydrochlorothiazide 100-25 MG per tablet  Commonly known as:  HYZAAR  Take 1 tablet by mouth daily.     metoprolol tartrate 25 MG tablet  Commonly known as:  LOPRESSOR  Take 1 tablet (25 mg total) by mouth 2 (two) times daily.        Allergies:  Allergies  Allergen Reactions  . Crestor [Rosuvastatin]  Nightmares in high dosage  . Zocor [Simvastatin]     Nightmares in high dosage    Past Medical History  Diagnosis Date  . Hyperlipidemia     Crestor 3 x wk and Zetia daily  . Hx of cardiac cath   . Coronary atherosclerosis of native coronary artery     2009 LAD CIRC DES  . Chronic kidney disease     stage 111  . PONV (postoperative nausea and vomiting)     pt states extremely sick  . Hypertension     takes Hyzaar daily  . Shortness of breath     with exertion  . Headache(784.0)   . Dizziness   . Joint pain   . History of gout     doesn't require meds  . Cancer     nose/Dr. Albertini;skin  . H/O hiatal hernia   . GERD (gastroesophageal reflux disease)     TUMS prn  . Chronic constipation   . Enlarged prostate     but doesn't require meds at present  . Diabetes mellitus     takes Januvia daily  . Anxiety   . Depression     doesn't take any meds for this  . Insomnia     doesn't require meds at present time  . Onychomycosis 10/05/2012    x 10  . Bunion  09/29/2011    Past Surgical History  Procedure Laterality Date  . Cholecystectomy    . Eye surgery      bilateral cataracts  . Hemorrhoid surgery    . Rotator cuff surgery      right   . Hernia repair      x 2;inguinal  . Cyst removed from finger      right hand  . Cataracts removed    . Skin cancer removed    . Cardiac catheterization  2013  . Coronary angioplasty      2 stents from 2009  . Colonoscopy    . Coronary artery bypass graft  10/29/2011    Procedure: CORONARY ARTERY BYPASS GRAFTING (CABG);  Surgeon: Kerin Perna, MD;  Location: [facility] OR;  Service: Open Heart Surgery;  Laterality: N/A;    Family History  Problem Relation Age of Onset  . Stroke Father   . Cancer Brother     Social History:  reports that he has quit smoking. He has never used smokeless tobacco. He reports that he does not drink alcohol or use illicit drugs.  Review of Systems:  HYPERTENSION:  well-controlled on losartan HCT   HYPERLIPIDEMIA: The lipid abnormality consists of elevated LDL and low HDL treated with 2 drugs but only 10 mg Crestor. Recently evaluated by cardiologist     Examination:   BP 128/72  Pulse 63  Temp(Src) 98.1 F (36.7 C)  Resp 14  Ht 5' 5.5" (1.664 m)  Wt 197 lb 9.6 oz (89.631 kg)  BMI 32.37 kg/m2  SpO2 96%  Body mass index is 32.37 kg/(m^2).   ASSESSMENT/ PLAN:   Diabetes type 2   The patient's diabetes control appears to be  fairly good with review of home readings but he has not had tendency to high readings after a certain meals when he is getting more carbohydrate and this was discussed Discussed adding protein to breakfast more consistently Also needs more consistent exercise for weight loss He is now using only 1 mg Amaryl as monotherapy without hypoglycemia with this and will continue.  Will need to check A1c and  other labs today  Lipids monitored by cardiologist  Reather Littler 07/12/2013, 10:18 AM   Labs as follows:  Lab Results  Component Value  Date   HGBA1C 7.0* 07/12/2013   HGBA1C 7.1* 01/19/2013   HGBA1C 7.0* 10/20/2012   Lab Results  Component Value Date   MICROALBUR 0.7 07/12/2013   LDLCALC 65 12/05/2012   CREATININE 1.5 01/19/2013

## 2013-07-12 NOTE — Patient Instructions (Signed)
Please check blood sugars at least half the time about 2 hours after any meal and as directed on waking up. Please bring blood sugar monitor to each visit  Walk 4-5 times a week  Reduce portions of sweets and cereals

## 2013-07-13 NOTE — Progress Notes (Signed)
Quick Note:  Please let patient know that the A1c shows average sugar about 150, stable, stay on same medications, urine okay ______

## 2013-08-21 ENCOUNTER — Encounter: Payer: Self-pay | Admitting: Cardiology

## 2013-08-21 ENCOUNTER — Other Ambulatory Visit: Payer: Medicare Other

## 2013-08-21 ENCOUNTER — Ambulatory Visit (INDEPENDENT_AMBULATORY_CARE_PROVIDER_SITE_OTHER): Payer: Medicare Other | Admitting: Cardiology

## 2013-08-21 VITALS — BP 179/92 | HR 56 | Ht 66.0 in | Wt 200.1 lb

## 2013-08-21 DIAGNOSIS — I25119 Atherosclerotic heart disease of native coronary artery with unspecified angina pectoris: Secondary | ICD-10-CM

## 2013-08-21 DIAGNOSIS — Z9889 Other specified postprocedural states: Secondary | ICD-10-CM

## 2013-08-21 DIAGNOSIS — I10 Essential (primary) hypertension: Secondary | ICD-10-CM

## 2013-08-21 DIAGNOSIS — I2510 Atherosclerotic heart disease of native coronary artery without angina pectoris: Secondary | ICD-10-CM

## 2013-08-21 DIAGNOSIS — I209 Angina pectoris, unspecified: Secondary | ICD-10-CM

## 2013-08-21 DIAGNOSIS — E119 Type 2 diabetes mellitus without complications: Secondary | ICD-10-CM

## 2013-08-21 DIAGNOSIS — E785 Hyperlipidemia, unspecified: Secondary | ICD-10-CM

## 2013-08-21 LAB — ALT: ALT: 21 U/L (ref 0–53)

## 2013-08-21 LAB — LIPID PANEL
Cholesterol: 129 mg/dL (ref 0–200)
HDL: 33 mg/dL — ABNORMAL LOW (ref >39.00)
LDL Cholesterol: 72 mg/dL (ref 0–99)
NonHDL: 96
Total CHOL/HDL Ratio: 4
Triglycerides: 120 mg/dL (ref 0.0–149.0)
VLDL: 24 mg/dL (ref 0.0–40.0)

## 2013-08-21 NOTE — Addendum Note (Signed)
Addended by: Alvina Filbert B on: 08/21/2013 11:56 AM   Modules accepted: Orders

## 2013-08-21 NOTE — Progress Notes (Signed)
[facility]. [address] [place], [place]  [address] Phone: [phone] Fax:  [phone]  Date:  08/21/2013   ID:  Cody Thomas, DOB Mar 15, 1930, MRN [id number]  PCP:  Gerrit Heck, MD   History of Present Illness: Cody Thomas is a 80 y.o. male with coronary disease status post 4 vessel bypass surgery 10/29/11 by Dr. Darcey Nora here for followup. He a brief postoperative atrial fibrillation. He still having some irritation in his chest wall post bypass. Otherwise is feeling quite well. He walked mile and a half without much difficulty.   His blood pressure has been much better now that he is on the combination of Losartan/HCTZ. Has not taken his medications this morning   Had to stop Zetia and Crestor becuase of cost.  Overall no anginal like symptoms.    Wt Readings from Last 3 Encounters:  08/21/13 200 lb 1.9 oz (90.774 kg)  07/12/13 197 lb 9.6 oz (89.631 kg)  07/05/13 199 lb (90.266 kg)     Past Medical History  Diagnosis Date  . Hyperlipidemia     Crestor 3 x wk and Zetia daily  . Hx of cardiac cath   . Coronary atherosclerosis of native coronary artery     2009 LAD CIRC DES  . Chronic kidney disease     stage 111  . PONV (postoperative nausea and vomiting)     pt states extremely sick  . Hypertension     takes Hyzaar daily  . Shortness of breath     with exertion  . Headache(784.0)   . Dizziness   . Joint pain   . History of gout     doesn't require meds  . Cancer     nose/Dr. Albertini;skin  . H/O hiatal hernia   . GERD (gastroesophageal reflux disease)     TUMS prn  . Chronic constipation   . Enlarged prostate     but doesn't require meds at present  . Diabetes mellitus     takes Januvia daily  . Anxiety   . Depression     doesn't take any meds for this  . Insomnia     doesn't require meds at present time  . Onychomycosis 10/05/2012    x 10  . Bunion 09/29/2011    Past Surgical History  Procedure Laterality  Date  . Cholecystectomy    . Eye surgery      bilateral cataracts  . Hemorrhoid surgery    . Rotator cuff surgery      right   . Hernia repair      x 2;inguinal  . Cyst removed from finger      right hand  . Cataracts removed    . Skin cancer removed    . Cardiac catheterization  2013  . Coronary angioplasty      2 stents from 2009  . Colonoscopy    . Coronary artery bypass graft  10/29/2011    Procedure: CORONARY ARTERY BYPASS GRAFTING (CABG);  Surgeon: Ivin Poot, MD;  Location: [facility];  Service: Open Heart Surgery;  Laterality: N/A;    Current Outpatient Prescriptions  Medication Sig Dispense Refill  . aspirin 325 MG tablet Take 325 mg by mouth daily.      Marland Kitchen atorvastatin (LIPITOR) 40 MG tablet Take 1 tablet (40 mg total) by mouth daily.  30 tablet  2  . fish oil-omega-3 fatty acids 1000 MG capsule Take 1,200 mg by mouth daily. 4  1200 tablets daily      . glimepiride (AMARYL) 1 MG tablet Take 1 tablet (1 mg total) by mouth daily.  30 tablet  3  . losartan-hydrochlorothiazide (HYZAAR) 100-25 MG per tablet Take 1 tablet by mouth daily.  90 tablet  3  . metoprolol tartrate (LOPRESSOR) 25 MG tablet Take 1 tablet (25 mg total) by mouth 2 (two) times daily.  60 tablet  4  . calcium carbonate (TUMS - DOSED IN MG ELEMENTAL CALCIUM) 500 MG chewable tablet Chew 2 tablets by mouth daily as needed. For indigestion.        No current facility-administered medications for this visit.    Allergies:    Allergies  Allergen Reactions  . Crestor [Rosuvastatin]     Nightmares in high dosage  . Zocor [Simvastatin]     Nightmares in high dosage    Social History:  The patient  reports that he has quit smoking. He has never used smokeless tobacco. He reports that he does not drink alcohol or use illicit drugs.   ROS:  Please see the history of present illness.   Mild HA at times. Chest soreness. No CVA. Positive waking. Trouble with dyspnea when bending over.  PHYSICAL EXAM: VS:  BP  179/92  Pulse 56  Ht 5\' 6"  (1.676 m)  Wt 200 lb 1.9 oz (90.774 kg)  BMI 32.32 kg/m2 Well nourished, well developed, in no acute distress HEENT: normal Neck: no JVD Cardiac:  normal S1, S2; RRR; no murmur Lungs:  clear to auscultation bilaterally, no wheezing, rhonchi or rales Abd: soft, nontender, no hepatomegalyoverweight.  Ext: no edema Skin: warm and dry Neuro: no focal abnormalities noted  EKG: 08/21/13 - Sinus bradycardia, first degree AV block, PR interval 298 ms otherwise unremarkable.  ASSESSMENT AND PLAN:  1. Hyperlipidemia-too expensive Crestor. Atorvastatin 40 mg once a day. ? If this is causing joint pain. Co-Q 10 suggested.  ALT, lipid profile today.  2. First degree AVB - 282ms. Stable. No syncope.  3. Hypertension-elevated today. Usually it is in normal range at home. Continue with current medications. He hasn't taken his yet this morning. 4. CAD-stable, post bypass. No current typical anginal symptoms. Still with some irritation left sternal region. 5. Obesity-continue to encourage weight loss. Contributing to his shortness of breath.  Signed, Candee Furbish, MD [facility]  08/21/2013 9:02 AM

## 2013-08-21 NOTE — Addendum Note (Signed)
Addended by: Eulis Foster on: 08/21/2013 09:14 AM   Modules accepted: Orders

## 2013-08-21 NOTE — Patient Instructions (Addendum)
Will obtain labs today and call you with the results (lipid/alt)  Your physician wants you to follow-up in: 6 month ov You will receive a reminder letter in the mail two months in advance. If you don't receive a letter, please call our office to schedule the follow-up appointment.   Start on CoQ 10 daily (100-200 mg)

## 2013-09-28 ENCOUNTER — Encounter: Payer: Self-pay | Admitting: Cardiology

## 2013-10-02 LAB — LIPID PANEL
Cholesterol: 178 mg/dL (ref 0–200)
HDL: 27 mg/dL — AB (ref 35–70)
LDL Cholesterol: 110 mg/dL
Triglycerides: 202 mg/dL — AB (ref 40–160)

## 2013-10-04 ENCOUNTER — Encounter: Payer: Self-pay | Admitting: Podiatry

## 2013-10-04 ENCOUNTER — Other Ambulatory Visit: Payer: Self-pay | Admitting: *Deleted

## 2013-10-04 ENCOUNTER — Ambulatory Visit (INDEPENDENT_AMBULATORY_CARE_PROVIDER_SITE_OTHER): Payer: Medicare Other | Admitting: Podiatry

## 2013-10-04 VITALS — BP 139/86 | HR 62 | Resp 12

## 2013-10-04 DIAGNOSIS — B351 Tinea unguium: Secondary | ICD-10-CM

## 2013-10-04 DIAGNOSIS — M79673 Pain in unspecified foot: Secondary | ICD-10-CM

## 2013-10-04 DIAGNOSIS — M79609 Pain in unspecified limb: Secondary | ICD-10-CM

## 2013-10-04 MED ORDER — ATORVASTATIN CALCIUM 80 MG PO TABS: 80.0000 mg | ORAL_TABLET | Freq: Every day | ORAL | Status: DC

## 2013-10-05 ENCOUNTER — Encounter: Payer: Self-pay | Admitting: *Deleted

## 2013-10-05 NOTE — Progress Notes (Signed)
Patient ID: Cody Thomas, male   DOB: [birth year], 80 y.o.   MRN: [id number]  Subjective: Orientated x3 male presents complaining of painful toenails  Objective: Hypertrophic, elongated, discolored toenails 6-10  Assessment: Symptomatic onychomycoses 6-10  Plan: Debrided toenails x10 without a bleeding  Reappoint x3 months

## 2013-10-30 DIAGNOSIS — N401 Enlarged prostate with lower urinary tract symptoms: Secondary | ICD-10-CM | POA: Insufficient documentation

## 2013-11-07 ENCOUNTER — Other Ambulatory Visit: Payer: Self-pay

## 2013-11-07 MED ORDER — METOPROLOL TARTRATE 25 MG PO TABS: 25.0000 mg | ORAL_TABLET | Freq: Two times a day (BID) | ORAL | Status: DC

## 2013-11-09 ENCOUNTER — Other Ambulatory Visit (INDEPENDENT_AMBULATORY_CARE_PROVIDER_SITE_OTHER): Payer: Medicare Other

## 2013-11-09 DIAGNOSIS — E119 Type 2 diabetes mellitus without complications: Secondary | ICD-10-CM

## 2013-11-09 DIAGNOSIS — E785 Hyperlipidemia, unspecified: Secondary | ICD-10-CM

## 2013-11-09 LAB — LIPID PANEL
Cholesterol: 126 mg/dL (ref 0–200)
HDL: 28.7 mg/dL — ABNORMAL LOW (ref >39.00)
LDL Cholesterol: 75 mg/dL (ref 0–99)
NonHDL: 97.3
Total CHOL/HDL Ratio: 4
Triglycerides: 113 mg/dL (ref 0.0–149.0)
VLDL: 22.6 mg/dL (ref 0.0–40.0)

## 2013-11-09 LAB — MICROALBUMIN / CREATININE URINE RATIO
Creatinine,U: 142.2 mg/dL
Microalb Creat Ratio: 0.4 mg/g (ref 0.0–30.0)
Microalb, Ur: 0.5 mg/dL (ref 0.0–1.9)

## 2013-11-09 LAB — BASIC METABOLIC PANEL
BUN: 21 mg/dL (ref 6–23)
CO2: 27 meq/L (ref 19–32)
Calcium: 9.7 mg/dL (ref 8.4–10.5)
Chloride: 103 meq/L (ref 96–112)
Creatinine, Ser: 1.5 mg/dL (ref 0.4–1.5)
GFR: 48.1 mL/min — ABNORMAL LOW (ref >60.00)
Glucose, Bld: 143 mg/dL — ABNORMAL HIGH (ref 70–99)
Potassium: 3.8 mEq/L (ref 3.5–5.1)
Sodium: 138 mEq/L (ref 135–145)

## 2013-11-09 LAB — COMPREHENSIVE METABOLIC PANEL
ALT: 21 U/L (ref 0–53)
AST: 22 U/L (ref 0–37)
Albumin: 3.9 g/dL (ref 3.5–5.2)
Alkaline Phosphatase: 42 U/L (ref 39–117)
BUN: 21 mg/dL (ref 6–23)
CO2: 27 mEq/L (ref 19–32)
Calcium: 9.7 mg/dL (ref 8.4–10.5)
Chloride: 103 meq/L (ref 96–112)
Creatinine, Ser: 1.5 mg/dL (ref 0.4–1.5)
GFR: 48.1 mL/min — ABNORMAL LOW (ref >60.00)
Glucose, Bld: 143 mg/dL — ABNORMAL HIGH (ref 70–99)
Potassium: 3.8 meq/L (ref 3.5–5.1)
Sodium: 138 mEq/L (ref 135–145)
Total Bilirubin: 1 mg/dL (ref 0.2–1.2)
Total Protein: 7.3 g/dL (ref 6.0–8.3)

## 2013-11-09 LAB — HEMOGLOBIN A1C: Hgb A1c MFr Bld: 7.1 % — ABNORMAL HIGH (ref 4.6–6.5)

## 2013-11-13 ENCOUNTER — Ambulatory Visit: Payer: Medicare Other | Admitting: Endocrinology

## 2013-11-14 ENCOUNTER — Ambulatory Visit (INDEPENDENT_AMBULATORY_CARE_PROVIDER_SITE_OTHER): Payer: Medicare Other | Admitting: Endocrinology

## 2013-11-14 ENCOUNTER — Encounter: Payer: Self-pay | Admitting: Endocrinology

## 2013-11-14 VITALS — BP 131/69 | HR 55 | Temp 98.6°F | Resp 16 | Ht 66.0 in | Wt 199.4 lb

## 2013-11-14 DIAGNOSIS — E119 Type 2 diabetes mellitus without complications: Secondary | ICD-10-CM

## 2013-11-14 DIAGNOSIS — E785 Hyperlipidemia, unspecified: Secondary | ICD-10-CM

## 2013-11-14 NOTE — Progress Notes (Signed)
Patient ID: Cody Thomas, male   DOB: 10-Oct-1930, 80 y.o.   MRN: [id number]   Reason for Appointment: Diabetes follow-up   History of Present Illness   Diagnosis: Type 2 DIABETES MELITUS  He has had mild diabetes which has been previously erratic partly because of dietary inconsistency and difficulty with weight loss He has not been continued on metformin because of renal dysfunction Was given low-dose Januvia instead but this was too expensive for him even though it was helping his control In 2014 he was given low dose Amaryl instead which appears to be keeping his blood sugars controlled  He is still taking 1 mg daily Did not bring his monitor for download today; however he does say he checks readings at different times of the day His blood sugars are sporadically higher after meals based on what he is eating.  He thinks they are higher when he is eating cereal in the morning or ice cream in the evening Has had no recent hypoglycemia Also has generally been trying to be active but not doing any programmed exercise, weight is about the same              Monitors blood glucose: Once a day.    Glucometer: FreeStyle          Blood Glucose readings from meter 95-153, rarely 170+, pc 140-217   Hypoglycemia: none        Meals: 3 meals per day. Sometimes toast, otherwise cereal. Physical activity: exercise: some walking            Wt Readings from Last 3 Encounters:  11/14/13 199 lb 6.4 oz (90.447 kg)  08/21/13 200 lb 1.9 oz (90.774 kg)  07/12/13 197 lb 9.6 oz (89.631 kg)   DM LABS:  Lab Results  Component Value Date   HGBA1C 7.1* 11/09/2013   HGBA1C 7.0* 07/12/2013   HGBA1C 7.1* 01/19/2013   Lab Results  Component Value Date   MICROALBUR 0.5 11/09/2013   LDLCALC 75 11/09/2013   CREATININE 1.5 11/09/2013   CREATININE 1.5 11/09/2013       Medication List       This list is accurate as of: 11/14/13  2:55 PM.  Always use your most recent med list.               aspirin  325 MG tablet  Take 325 mg by mouth daily.     atorvastatin 80 MG tablet  Commonly known as:  LIPITOR  Take 80 mg by mouth daily. Patient says he takes it 2 times a week     calcium carbonate 500 MG chewable tablet  Commonly known as:  TUMS - dosed in mg elemental calcium  Chew 2 tablets by mouth daily as needed. For indigestion.     Co Q 10 100 MG Caps  Take by mouth.     fish oil-omega-3 fatty acids 1000 MG capsule  Take 1,200 mg by mouth daily. 4 1200 tablets daily     glimepiride 1 MG tablet  Commonly known as:  AMARYL  Take 1 tablet (1 mg total) by mouth daily.     losartan-hydrochlorothiazide 100-25 MG per tablet  Commonly known as:  HYZAAR  Take 1 tablet by mouth daily.     metoprolol tartrate 25 MG tablet  Commonly known as:  LOPRESSOR  Take 1 tablet (25 mg total) by mouth 2 (two) times daily.     tobramycin 0.3 % ophthalmic solution  Commonly known as:  TOBREX        Allergies:  Allergies  Allergen Reactions  . Crestor [Rosuvastatin]     Nightmares in high dosage  . Zocor [Simvastatin]     Nightmares in high dosage    Past Medical History  Diagnosis Date  . Hyperlipidemia     Crestor 3 x wk and Zetia daily  . Hx of cardiac cath   . Coronary atherosclerosis of native coronary artery     2009 LAD CIRC DES  . Chronic kidney disease     stage 111  . PONV (postoperative nausea and vomiting)     pt states extremely sick  . Hypertension     takes Hyzaar daily  . Shortness of breath     with exertion  . Headache(784.0)   . Dizziness   . Joint pain   . History of gout     doesn't require meds  . Cancer     nose/Dr. Albertini;skin  . H/O hiatal hernia   . GERD (gastroesophageal reflux disease)     TUMS prn  . Chronic constipation   . Enlarged prostate     but doesn't require meds at present  . Diabetes mellitus     takes Januvia daily  . Anxiety   . Depression     doesn't take any meds for this  . Insomnia     doesn't require meds at  present time  . Onychomycosis 10/05/2012    x 10  . Bunion 09/29/2011    Past Surgical History  Procedure Laterality Date  . Cholecystectomy    . Eye surgery      bilateral cataracts  . Hemorrhoid surgery    . Rotator cuff surgery      right   . Hernia repair      x 2;inguinal  . Cyst removed from finger      right hand  . Cataracts removed    . Skin cancer removed    . Cardiac catheterization  2013  . Coronary angioplasty      2 stents from 2009  . Colonoscopy    . Coronary artery bypass graft  10/29/2011    Procedure: CORONARY ARTERY BYPASS GRAFTING (CABG);  Surgeon: Ivin Poot, MD;  Location: [facility];  Service: Open Heart Surgery;  Laterality: N/A;    Family History  Problem Relation Age of Onset  . Stroke Father   . Cancer Brother     Social History:  reports that he has quit smoking. He has never used smokeless tobacco. He reports that he does not drink alcohol or use illicit drugs.  Review of Systems:  HYPERTENSION:  well-controlled on losartan HCT   HYPERLIPIDEMIA: The lipid abnormality consists of elevated LDL and low HDL treated with Lipitor  Has been recently evaluated by cardiologist. He did not want to continue Crestor and Zetia and is only taking Lipitor He says that the 80 mg dose prescribed by cardiologist causes nightmares  Currently taking only 40 mg, not everyday; muscle aches are better with using CoQ10   Lab Results  Component Value Date   CHOL 126 11/09/2013   HDL 28.70* 11/09/2013   LDLCALC 75 11/09/2013   TRIG 113.0 11/09/2013   CHOLHDL 4 11/09/2013        Examination:   BP 131/69  Pulse 55  Temp(Src) 98.6 F (37 C)  Resp 16  Ht 5\' 6"  (1.676 m)  Wt 199 lb 6.4 oz (90.447 kg)  BMI 32.20 kg/m2  SpO2  95%  Body mass index is 32.2 kg/(m^2).   ASSESSMENT/ PLAN:   Diabetes type 2   The patient's diabetes control appears to be  fairly good with review of home readings  and near-normal A1c Considering his age, duration of diabetes and  comorbid conditions his A1c of 7.1% is excellent Has only occasional high postprandial readings He needs more consistent exercise for weight loss He is using only 1 mg Amaryl as monotherapy without hypoglycemia with this and will continue.    Hyperlipidemia:   currently managed by cardiologist. His LDL is relatively good although he still still low He will continue his Lipitor as tolerated  Cody Thomas 11/14/2013, 2:55 PM

## 2013-12-11 ENCOUNTER — Other Ambulatory Visit: Payer: Self-pay | Admitting: Endocrinology

## 2014-01-08 ENCOUNTER — Ambulatory Visit: Payer: Medicare Other | Admitting: Podiatry

## 2014-01-15 ENCOUNTER — Encounter: Payer: Self-pay | Admitting: Podiatry

## 2014-01-15 ENCOUNTER — Ambulatory Visit (INDEPENDENT_AMBULATORY_CARE_PROVIDER_SITE_OTHER): Payer: Medicare Other | Admitting: Podiatry

## 2014-01-15 DIAGNOSIS — M79676 Pain in unspecified toe(s): Secondary | ICD-10-CM

## 2014-01-15 DIAGNOSIS — B351 Tinea unguium: Secondary | ICD-10-CM

## 2014-01-16 NOTE — Progress Notes (Signed)
Patient ID: Cody Thomas, male   DOB: 01-07-1931, 80 y.o.   MRN: [id number]  Subjective: This patient presents for ongoing debridement of painful toenails  Objective: The toenails are hypertrophic, discolored, incurvated 6-10  Assessment: Symptomatic onychomycoses 6-10  Plan: Debrided toenails 10 without a bleeding  Reappoint 3 months

## 2014-02-08 ENCOUNTER — Encounter (HOSPITAL_COMMUNITY): Payer: Self-pay | Admitting: Cardiology

## 2014-02-09 ENCOUNTER — Other Ambulatory Visit: Payer: Self-pay | Admitting: *Deleted

## 2014-02-09 ENCOUNTER — Ambulatory Visit (INDEPENDENT_AMBULATORY_CARE_PROVIDER_SITE_OTHER): Payer: Medicare Other | Admitting: Cardiology

## 2014-02-09 ENCOUNTER — Encounter: Payer: Self-pay | Admitting: Cardiology

## 2014-02-09 VITALS — BP 130/76 | HR 76 | Ht 66.0 in | Wt 201.0 lb

## 2014-02-09 DIAGNOSIS — I2510 Atherosclerotic heart disease of native coronary artery without angina pectoris: Secondary | ICD-10-CM

## 2014-02-09 DIAGNOSIS — E785 Hyperlipidemia, unspecified: Secondary | ICD-10-CM

## 2014-02-09 DIAGNOSIS — I10 Essential (primary) hypertension: Secondary | ICD-10-CM

## 2014-02-09 NOTE — Patient Instructions (Signed)
The current medical regimen is effective;  continue present plan and medications.  Follow up in 6 months with Dr. Skains.  You will receive a letter in the mail 2 months before you are due.  Please call us when you receive this letter to schedule your follow up appointment.  Thank you for choosing [facility]!!     

## 2014-02-09 NOTE — Progress Notes (Signed)
.     [address], [address] [place], [place]  [address] Phone: [phone] Fax:  [phone]  Date:  02/09/2014   ID:  Cody Thomas, DOB 05/20/1930, MRN [id number]  PCP:  Gaye Alken, MD   History of Present Illness: Cody Thomas is a 80 y.o. male with coronary disease status post 4 vessel bypass surgery 10/29/11 by Dr. Maren Beach here for followup. He a brief postoperative atrial fibrillation. He still having some irritation in his chest wall post bypass. Otherwise is feeling quite well. He does yard work without difficulty. He does feel shortness of breath especially when bending over to tie his shoes. He also continues to feel aching in his joints, he wonders if this is the atorvastatin. His blood pressure has been much better now that he is on the combination of Losartan/HCTZ.   Had to stop Zetia and Crestor becuase of cost. Checking cost of Crestor once again  Overall no anginal like symptoms.    Wt Readings from Last 3 Encounters:  02/09/14 201 lb (91.173 kg)  11/14/13 199 lb 6.4 oz (90.447 kg)  08/21/13 200 lb 1.9 oz (90.774 kg)     Past Medical History  Diagnosis Date  . Hyperlipidemia     Crestor 3 x wk and Zetia daily  . Hx of cardiac cath   . Coronary atherosclerosis of native coronary artery     2009 LAD CIRC DES  . Chronic kidney disease     stage 111  . PONV (postoperative nausea and vomiting)     pt states extremely sick  . Hypertension     takes Hyzaar daily  . Shortness of breath     with exertion  . Headache(784.0)   . Dizziness   . Joint pain   . History of gout     doesn't require meds  . Cancer     nose/Dr. Albertini;skin  . H/O hiatal hernia   . GERD (gastroesophageal reflux disease)     TUMS prn  . Chronic constipation   . Enlarged prostate     but doesn't require meds at present  . Diabetes mellitus     takes Januvia daily  . Anxiety   . Depression     doesn't take any meds for this  . Insomnia    doesn't require meds at present time  . Onychomycosis 10/05/2012    x 10  . Bunion 09/29/2011    Past Surgical History  Procedure Laterality Date  . Cholecystectomy    . Eye surgery      bilateral cataracts  . Hemorrhoid surgery    . Rotator cuff surgery      right   . Hernia repair      x 2;inguinal  . Cyst removed from finger      right hand  . Cataracts removed    . Skin cancer removed    . Cardiac catheterization  2013  . Coronary angioplasty      2 stents from 2009  . Colonoscopy    . Coronary artery bypass graft  10/29/2011    Procedure: CORONARY ARTERY BYPASS GRAFTING (CABG);  Surgeon: Kerin Perna, MD;  Location: [facility] [place] [facility];  Service: Open Heart Surgery;  Laterality: N/A;  . Left heart catheterization with coronary angiogram N/A 10/21/2011    Procedure: LEFT HEART CATHETERIZATION WITH CORONARY ANGIOGRAM;  Surgeon: Donato Schultz, MD;  Location: [facility] CATH LAB;  Service: Cardiovascular;  Laterality: N/A;    Current Outpatient  Prescriptions  Medication Sig Dispense Refill  . aspirin 325 MG tablet Take 325 mg by mouth daily.    Marland Kitchen atorvastatin (LIPITOR) 80 MG tablet Take 80 mg by mouth daily. Patient says he takes it 2 times a week    . calcium carbonate (TUMS - DOSED IN MG ELEMENTAL CALCIUM) 500 MG chewable tablet Chew 2 tablets by mouth daily as needed. For indigestion.     . Coenzyme Q10-Vitamin E 100-1 MG-UNT/5ML SYRP Take 100 mg by mouth daily. Take 2 tablespoons (100MG ) Daily    . fish oil-omega-3 fatty acids 1000 MG capsule Take 1,200 mg by mouth daily. 4 1200 tablets daily    . glimepiride (AMARYL) 1 MG tablet TAKE 1 TABLET BY MOUTH ONCE A DAY 30 tablet 3  . losartan-hydrochlorothiazide (HYZAAR) 100-25 MG per tablet Take 1 tablet by mouth daily. 90 tablet 3  . metoprolol tartrate (LOPRESSOR) 25 MG tablet Take 1 tablet (25 mg total) by mouth 2 (two) times daily. 60 tablet 4  . tobramycin (TOBREX) 0.3 % ophthalmic solution Place 1 drop into both eyes as needed (For eye  irritation).      No current facility-administered medications for this visit.    Allergies:    Allergies  Allergen Reactions  . Crestor [Rosuvastatin]     Nightmares in high dosage  . Zocor [Simvastatin]     Nightmares in high dosage    Social History:  The patient  reports that he has quit smoking. He has never used smokeless tobacco. He reports that he does not drink alcohol or use illicit drugs.   ROS:  Please see the history of present illness.   Mild HA at times. Chest soreness. No CVA. Positive waking. Trouble with dyspnea when bending over.  PHYSICAL EXAM: VS:  BP 130/76 mmHg  Pulse 76  Ht 5\' 6"  (1.676 m)  Wt 201 lb (91.173 kg)  BMI 32.46 kg/m2 Well nourished, well developed, in no acute distress HEENT: normal Neck: no JVD Cardiac:  normal S1, S2; RRR; no murmur Lungs:  clear to auscultation bilaterally, no wheezing, rhonchi or rales Abd: soft, nontender, no hepatomegalyoverweight. Protuberant Ext: no edema Skin: warm and dry Neuro: no focal abnormalities noted  EKG: 08/21/13 - Sinus bradycardia, first degree AV block, PR interval 298 ms otherwise unremarkable.  ASSESSMENT AND PLAN:  1. Hyperlipidemia-too expensive Crestor 10mg . Atorvastatin 80 mg once a day. ? If this is causing joint pain. Co-Q 10 suggested.  He actually has had very good success from LDL down to 75 on high-dose atorvastatin. He S4 1 written prescription in order to price out Crestor once again. If Crestor is reasonably priced, he would like to switch back to this.  2. First degree AVB - . Stable. No syncope.  3. Hypertension-well-controlled today. Usually it is in normal range at home. Continue with current medications.  4. CAD-stable, post bypass. No current typical anginal symptoms. Still with some irritation left sternal region. 5. Obesity-continue to encourage weight loss. Contributing to his shortness of breath. 6. Six-month follow-up  Signed, Donato Schultz, MD [facility]  02/09/2014 8:49 AM

## 2014-03-07 ENCOUNTER — Other Ambulatory Visit: Payer: Self-pay | Admitting: Endocrinology

## 2014-03-13 ENCOUNTER — Other Ambulatory Visit (INDEPENDENT_AMBULATORY_CARE_PROVIDER_SITE_OTHER): Payer: Medicare Other

## 2014-03-13 DIAGNOSIS — E119 Type 2 diabetes mellitus without complications: Secondary | ICD-10-CM

## 2014-03-13 LAB — COMPREHENSIVE METABOLIC PANEL
ALT: 20 U/L (ref 0–53)
AST: 22 U/L (ref 0–37)
Albumin: 3.9 g/dL (ref 3.5–5.2)
Alkaline Phosphatase: 43 U/L (ref 39–117)
BUN: 21 mg/dL (ref 6–23)
CO2: 28 meq/L (ref 19–32)
Calcium: 9.2 mg/dL (ref 8.4–10.5)
Chloride: 103 mEq/L (ref 96–112)
Creatinine, Ser: 1.5 mg/dL (ref 0.4–1.5)
GFR: 48.43 mL/min — ABNORMAL LOW (ref >60.00)
Glucose, Bld: 165 mg/dL — ABNORMAL HIGH (ref 70–99)
Potassium: 3.7 mEq/L (ref 3.5–5.1)
Sodium: 136 mEq/L (ref 135–145)
Total Bilirubin: 1 mg/dL (ref 0.2–1.2)
Total Protein: 7.4 g/dL (ref 6.0–8.3)

## 2014-03-13 LAB — MICROALBUMIN / CREATININE URINE RATIO
Creatinine,U: 168.2 mg/dL
Microalb Creat Ratio: 0.4 mg/g (ref 0.0–30.0)
Microalb, Ur: 0.7 mg/dL (ref 0.0–1.9)

## 2014-03-13 LAB — HEMOGLOBIN A1C: Hgb A1c MFr Bld: 7.8 % — ABNORMAL HIGH (ref 4.6–6.5)

## 2014-03-16 ENCOUNTER — Ambulatory Visit: Payer: Medicare Other | Admitting: Endocrinology

## 2014-03-16 ENCOUNTER — Ambulatory Visit (INDEPENDENT_AMBULATORY_CARE_PROVIDER_SITE_OTHER): Payer: Medicare Other | Admitting: Endocrinology

## 2014-03-16 ENCOUNTER — Encounter: Payer: Self-pay | Admitting: Endocrinology

## 2014-03-16 VITALS — BP 145/76 | HR 62 | Temp 98.3°F | Resp 16 | Ht 66.0 in | Wt 201.2 lb

## 2014-03-16 DIAGNOSIS — E1165 Type 2 diabetes mellitus with hyperglycemia: Secondary | ICD-10-CM

## 2014-03-16 DIAGNOSIS — E785 Hyperlipidemia, unspecified: Secondary | ICD-10-CM

## 2014-03-16 DIAGNOSIS — IMO0002 Reserved for concepts with insufficient information to code with codable children: Secondary | ICD-10-CM

## 2014-03-16 MED ORDER — GLIMEPIRIDE 2 MG PO TABS: 2.0000 mg | ORAL_TABLET | Freq: Every day | ORAL | Status: DC

## 2014-03-16 MED ORDER — METFORMIN HCL ER 500 MG PO TB24: 500.0000 mg | ORAL_TABLET | Freq: Every day | ORAL | Status: DC

## 2014-03-16 NOTE — Patient Instructions (Signed)
Take 2 Glimeperide BEFORE supper  Please check blood sugars at least half the time about 2 hours after any meal and times per week on waking up. Please bring blood sugar monitor to each visit  Walk at mall daily  Reduce cookies  Metformin after dinner

## 2014-03-16 NOTE — Progress Notes (Signed)
Patient ID: Cody Thomas, male   DOB: March 16, 1930, 81 y.o.   MRN: [id number]   Reason for Appointment: Diabetes follow-up   History of Present Illness   Diagnosis: Type 2 DIABETES MELITUS  He has had mild diabetes which has been previously erratic partly because of dietary inconsistency and difficulty with weight loss He has not been continued on metformin because of renal dysfunction Was given low-dose Januvia instead but this was too expensive for him even though it was helping his control In 2014 he was given low dose Amaryl instead which appears to be keeping his blood sugars controlled   Recent history: His blood sugars appear to be poorly controlled compared to previous levels with fairly consistently high readings after dinner and in the morning also. He has not gained any weight although overall has been less compliant with the diet in the last month or so; he will sometimes have sweets like cookies.  Also still periodically eating cereal in the morning despite reminders about getting balanced meals He is still taking Amaryl 1 mg daily pcs He has not been doing any walking either.            Monitors blood glucose: Once a day.    Glucometer:   Accu-Chek         Blood Glucose readings from meter   PRE-MEAL Breakfast Lunch Dinner Bedtime Overall  Glucose range:  128-161    130   115-251    Mean/median:  151     188   166    Hypoglycemia: none        Meals: 3 meals per day.  Physical activity: exercise: less walking            Wt Readings from Last 3 Encounters:  03/16/14 201 lb 3.2 oz (91.264 kg)  02/09/14 201 lb (91.173 kg)  11/14/13 199 lb 6.4 oz (90.447 kg)   DM LABS:  Lab Results  Component Value Date   HGBA1C 7.8* 03/13/2014   HGBA1C 7.1* 11/09/2013   HGBA1C 7.0* 07/12/2013   Lab Results  Component Value Date   MICROALBUR 0.7 03/13/2014   LDLCALC 75 11/09/2013   CREATININE 1.5 03/13/2014   Appointment on 03/13/2014  Component Date Value Ref Range Status   . Hgb A1c MFr Bld 03/13/2014 7.8* 4.6 - 6.5 % Final   Glycemic Control Guidelines for People with Diabetes:Non Diabetic:  <6%Goal of Therapy: <7%Additional Action Suggested:  >8%   . Sodium 03/13/2014 136  135 - 145 mEq/L Final  . Potassium 03/13/2014 3.7  3.5 - 5.1 mEq/L Final  . Chloride 03/13/2014 103  96 - 112 mEq/L Final  . CO2 03/13/2014 28  19 - 32 mEq/L Final  . Glucose, Bld 03/13/2014 165* 70 - 99 mg/dL Final  . BUN 03/13/2014 21  6 - 23 mg/dL Final  . Creatinine, Ser 03/13/2014 1.5  0.4 - 1.5 mg/dL Final  . Total Bilirubin 03/13/2014 1.0  0.2 - 1.2 mg/dL Final  . Alkaline Phosphatase 03/13/2014 43  39 - 117 U/L Final  . AST 03/13/2014 22  0 - 37 U/L Final  . ALT 03/13/2014 20  0 - 53 U/L Final  . Total Protein 03/13/2014 7.4  6.0 - 8.3 g/dL Final  . Albumin 03/13/2014 3.9  3.5 - 5.2 g/dL Final  . Calcium 03/13/2014 9.2  8.4 - 10.5 mg/dL Final  . GFR 03/13/2014 48.43* >60.00 mL/min Final  . Microalb, Ur 03/13/2014 0.7  0.0 - 1.9 mg/dL Final  .  Creatinine,U 03/13/2014 168.2   Final  . Microalb Creat Ratio 03/13/2014 0.4  0.0 - 30.0 mg/g Final       Medication List       This list is accurate as of: 03/16/14  9:58 AM.  Always use your most recent med list.               aspirin 325 MG tablet  Take 325 mg by mouth daily.     atorvastatin 80 MG tablet  Commonly known as:  LIPITOR  Take 80 mg by mouth daily. Patient says he takes it 2 times a week     calcium carbonate 500 MG chewable tablet  Commonly known as:  TUMS - dosed in mg elemental calcium  Chew 2 tablets by mouth daily as needed. For indigestion.     Coenzyme Q10-Vitamin E 100-1 MG-UNT/5ML Syrp  Take 100 mg by mouth daily. Take 2 tablespoons (100MG ) Daily     fish oil-omega-3 fatty acids 1000 MG capsule  Take 1,200 mg by mouth daily. 4 1200 tablets daily     glimepiride 1 MG tablet  Commonly known as:  AMARYL  TAKE 1 TABLET BY MOUTH EVERY DAY     losartan-hydrochlorothiazide 100-25 MG per tablet   Commonly known as:  HYZAAR  Take 1 tablet by mouth daily.     metoprolol tartrate 25 MG tablet  Commonly known as:  LOPRESSOR  Take 1 tablet (25 mg total) by mouth 2 (two) times daily.     tobramycin 0.3 % ophthalmic solution  Commonly known as:  TOBREX  Place 1 drop into both eyes as needed (For eye irritation).        Allergies:  Allergies  Allergen Reactions  . Crestor [Rosuvastatin]     Nightmares in high dosage  . Zocor [Simvastatin]     Nightmares in high dosage    Past Medical History  Diagnosis Date  . Hyperlipidemia     Crestor 3 x wk and Zetia daily  . Hx of cardiac cath   . Coronary atherosclerosis of native coronary artery     2009 LAD CIRC DES  . Chronic kidney disease     stage 111  . PONV (postoperative nausea and vomiting)     pt states extremely sick  . Hypertension     takes Hyzaar daily  . Shortness of breath     with exertion  . Headache(784.0)   . Dizziness   . Joint pain   . History of gout     doesn't require meds  . Cancer     nose/Dr. Albertini;skin  . H/O hiatal hernia   . GERD (gastroesophageal reflux disease)     TUMS prn  . Chronic constipation   . Enlarged prostate     but doesn't require meds at present  . Diabetes mellitus     takes Januvia daily  . Anxiety   . Depression     doesn't take any meds for this  . Insomnia     doesn't require meds at present time  . Onychomycosis 10/05/2012    x 10  . Bunion 09/29/2011    Past Surgical History  Procedure Laterality Date  . Cholecystectomy    . Eye surgery      bilateral cataracts  . Hemorrhoid surgery    . Rotator cuff surgery      right   . Hernia repair      x 2;inguinal  . Cyst removed from finger  right hand  . Cataracts removed    . Skin cancer removed    . Cardiac catheterization  2013  . Coronary angioplasty      2 stents from 2009  . Colonoscopy    . Coronary artery bypass graft  10/29/2011    Procedure: CORONARY ARTERY BYPASS GRAFTING (CABG);   Surgeon: Ivin Poot, MD;  Location: [place];  Service: Open Heart Surgery;  Laterality: N/A;  . Left heart catheterization with coronary angiogram N/A 10/21/2011    Procedure: LEFT HEART CATHETERIZATION WITH CORONARY ANGIOGRAM;  Surgeon: Candee Furbish, MD;  Location: [facility] CATH LAB;  Service: Cardiovascular;  Laterality: N/A;    Family History  Problem Relation Age of Onset  . Stroke Father   . Cancer Brother     Social History:  reports that he has quit smoking. He has never used smokeless tobacco. He reports that he does not drink alcohol or use illicit drugs.  Review of Systems:  HYPERTENSION:  fairly well-controlled on losartan HCT   HYPERLIPIDEMIA: The lipid abnormality consists of elevated LDL and low HDL treated with Lipitor   He did not want to continue Crestor and Zetia and is only taking Lipitor He says that the 80 mg dose prescribed by cardiologist causes nightmares  Currently taking only 40 mg, not everyday; muscle aches are better with using CoQ10   Lab Results  Component Value Date   CHOL 126 11/09/2013   HDL 28.70* 11/09/2013   LDLCALC 75 11/09/2013   TRIG 113.0 11/09/2013   CHOLHDL 4 11/09/2013        Examination:   BP 145/76 mmHg  Pulse 62  Temp(Src) 98.3 F (36.8 C)  Resp 16  Ht 5\' 6"  (1.676 m)  Wt 201 lb 3.2 oz (91.264 kg)  BMI 32.49 kg/m2  SpO2 93%  Body mass index is 32.49 kg/(m^2).   ASSESSMENT/ PLAN:   Diabetes type 2   The patient's diabetes control appears to be overall worse with rising A1c and mostly high readings at home including recently He is only taking 1 mg Amaryl for his treatment He has not gained weight and although he has not been compliant with his diet and exercise regimen was likely has some progression of his diabetes. He is not taking metformin and may not have taken this before because of renal dysfunction However his kidney function is reasonable for low-dose metformin  Discussed actions of metformin, possible side effects  and dosage He will increase his Amaryl to 2 mg at suppertime and make sure he takes his before eating He needs more consistent exercise for weight loss   Hyperlipidemia:  He will continue his Lipitor as tolerated  Patient Instructions  Take 2 Glimeperide BEFORE supper  Please check blood sugars at least half the time about 2 hours after any meal and times per week on waking up. Please bring blood sugar monitor to each visit  Walk at mall daily  Reduce cookies  Metformin after dinner      Tom Ragsdale 03/16/2014, 9:58 AM

## 2014-03-21 ENCOUNTER — Other Ambulatory Visit: Payer: Self-pay | Admitting: Cardiology

## 2014-03-27 ENCOUNTER — Emergency Department (HOSPITAL_COMMUNITY)
Admission: EM | Admit: 2014-03-27 | Discharge: 2014-03-27 | Disposition: A | Discharge status: Home / Self Care | Payer: Medicare Other | Attending: Emergency Medicine | Admitting: Emergency Medicine

## 2014-03-27 ENCOUNTER — Encounter (HOSPITAL_COMMUNITY): Payer: Self-pay | Admitting: Emergency Medicine

## 2014-03-27 ENCOUNTER — Emergency Department (HOSPITAL_COMMUNITY): Payer: Medicare Other

## 2014-03-27 DIAGNOSIS — E785 Hyperlipidemia, unspecified: Secondary | ICD-10-CM | POA: Insufficient documentation

## 2014-03-27 DIAGNOSIS — Z7982 Long term (current) use of aspirin: Secondary | ICD-10-CM | POA: Insufficient documentation

## 2014-03-27 DIAGNOSIS — Z9861 Coronary angioplasty status: Secondary | ICD-10-CM | POA: Insufficient documentation

## 2014-03-27 DIAGNOSIS — N181 Chronic kidney disease, stage 1: Secondary | ICD-10-CM | POA: Insufficient documentation

## 2014-03-27 DIAGNOSIS — N201 Calculus of ureter: Secondary | ICD-10-CM | POA: Insufficient documentation

## 2014-03-27 DIAGNOSIS — Z79899 Other long term (current) drug therapy: Secondary | ICD-10-CM | POA: Insufficient documentation

## 2014-03-27 DIAGNOSIS — I2510 Atherosclerotic heart disease of native coronary artery without angina pectoris: Secondary | ICD-10-CM | POA: Insufficient documentation

## 2014-03-27 DIAGNOSIS — Z9889 Other specified postprocedural states: Secondary | ICD-10-CM | POA: Diagnosis not present

## 2014-03-27 DIAGNOSIS — F329 Major depressive disorder, single episode, unspecified: Secondary | ICD-10-CM | POA: Diagnosis not present

## 2014-03-27 DIAGNOSIS — Z951 Presence of aortocoronary bypass graft: Secondary | ICD-10-CM | POA: Diagnosis not present

## 2014-03-27 DIAGNOSIS — F419 Anxiety disorder, unspecified: Secondary | ICD-10-CM | POA: Diagnosis not present

## 2014-03-27 DIAGNOSIS — N400 Enlarged prostate without lower urinary tract symptoms: Secondary | ICD-10-CM | POA: Diagnosis not present

## 2014-03-27 DIAGNOSIS — E119 Type 2 diabetes mellitus without complications: Secondary | ICD-10-CM | POA: Insufficient documentation

## 2014-03-27 DIAGNOSIS — I129 Hypertensive chronic kidney disease with stage 1 through stage 4 chronic kidney disease, or unspecified chronic kidney disease: Secondary | ICD-10-CM | POA: Insufficient documentation

## 2014-03-27 DIAGNOSIS — Z8739 Personal history of other diseases of the musculoskeletal system and connective tissue: Secondary | ICD-10-CM | POA: Insufficient documentation

## 2014-03-27 DIAGNOSIS — M545 Low back pain: Secondary | ICD-10-CM | POA: Diagnosis present

## 2014-03-27 DIAGNOSIS — Z8522 Personal history of malignant neoplasm of nasal cavities, middle ear, and accessory sinuses: Secondary | ICD-10-CM | POA: Diagnosis not present

## 2014-03-27 DIAGNOSIS — Z7951 Long term (current) use of inhaled steroids: Secondary | ICD-10-CM | POA: Diagnosis not present

## 2014-03-27 DIAGNOSIS — Z87891 Personal history of nicotine dependence: Secondary | ICD-10-CM | POA: Diagnosis not present

## 2014-03-27 DIAGNOSIS — M549 Dorsalgia, unspecified: Secondary | ICD-10-CM

## 2014-03-27 LAB — CBC WITH DIFFERENTIAL/PLATELET
Basophils Absolute: 0 10*3/uL (ref 0.0–0.1)
Basophils Relative: 0 % (ref 0–1)
Eosinophils Absolute: 0.1 10*3/uL (ref 0.0–0.7)
Eosinophils Relative: 1 % (ref 0–5)
HCT: 47.8 % (ref 39.0–52.0)
Hemoglobin: 16.4 g/dL (ref 13.0–17.0)
Lymphocytes Relative: 12 % (ref 12–46)
Lymphs Abs: 1.3 10*3/uL (ref 0.7–4.0)
MCH: 32.3 pg (ref 26.0–34.0)
MCHC: 34.3 g/dL (ref 30.0–36.0)
MCV: 94.3 fL (ref 78.0–100.0)
Monocytes Absolute: 0.7 10*3/uL (ref 0.1–1.0)
Monocytes Relative: 6 % (ref 3–12)
Neutro Abs: 8.4 10*3/uL — ABNORMAL HIGH (ref 1.7–7.7)
Neutrophils Relative %: 81 % — ABNORMAL HIGH (ref 43–77)
Platelets: 215 10*3/uL (ref 150–400)
RBC: 5.07 MIL/uL (ref 4.22–5.81)
RDW: 12.7 % (ref 11.5–15.5)
WBC: 10.4 10*3/uL (ref 4.0–10.5)

## 2014-03-27 LAB — URINALYSIS, ROUTINE W REFLEX MICROSCOPIC
Bilirubin Urine: NEGATIVE
Glucose, UA: 500 mg/dL — AB
Ketones, ur: NEGATIVE mg/dL
Nitrite: NEGATIVE
Protein, ur: 30 mg/dL — AB
Specific Gravity, Urine: 1.02 (ref 1.005–1.030)
Urobilinogen, UA: 0.2 mg/dL (ref 0.0–1.0)
pH: 5 (ref 5.0–8.0)

## 2014-03-27 LAB — BASIC METABOLIC PANEL
Anion gap: 10 (ref 5–15)
BUN: 27 mg/dL — ABNORMAL HIGH (ref 6–23)
CO2: 25 mmol/L (ref 19–32)
Calcium: 9.5 mg/dL (ref 8.4–10.5)
Chloride: 102 mmol/L (ref 96–112)
Creatinine, Ser: 1.62 mg/dL — ABNORMAL HIGH (ref 0.50–1.35)
GFR calc Af Amer: 44 mL/min — ABNORMAL LOW (ref >90)
GFR calc non Af Amer: 38 mL/min — ABNORMAL LOW (ref >90)
Glucose, Bld: 260 mg/dL — ABNORMAL HIGH (ref 70–99)
Potassium: 3.9 mmol/L (ref 3.5–5.1)
Sodium: 137 mmol/L (ref 135–145)

## 2014-03-27 LAB — URINE MICROSCOPIC-ADD ON

## 2014-03-27 MED ORDER — GADOBENATE DIMEGLUMINE 529 MG/ML IV SOLN
20.0000 mL | Freq: Once | INTRAVENOUS | Status: AC | PRN
  Administered 2014-03-27: 9 mL via INTRAVENOUS

## 2014-03-27 MED ORDER — MORPHINE SULFATE 4 MG/ML IJ SOLN
4.0000 mg | Freq: Once | INTRAMUSCULAR | Status: AC
  Administered 2014-03-27: 4 mg via INTRAVENOUS
  Filled 2014-03-27: qty 1

## 2014-03-27 MED ORDER — OXYCODONE-ACETAMINOPHEN 5-325 MG PO TABS: 1.0000 | ORAL_TABLET | Freq: Four times a day (QID) | ORAL | Status: DC | PRN

## 2014-03-27 MED ORDER — SODIUM CHLORIDE 0.9 % IV BOLUS (SEPSIS)
500.0000 mL | Freq: Once | INTRAVENOUS | Status: AC
  Administered 2014-03-27: 500 mL via INTRAVENOUS

## 2014-03-27 MED ORDER — ONDANSETRON HCL 4 MG PO TABS: 4.0000 mg | ORAL_TABLET | Freq: Four times a day (QID) | ORAL | Status: DC

## 2014-03-27 MED ORDER — TAMSULOSIN HCL 0.4 MG PO CAPS: 0.4000 mg | ORAL_CAPSULE | Freq: Every day | ORAL | Status: DC

## 2014-03-27 MED ORDER — ONDANSETRON HCL 4 MG/2ML IJ SOLN
4.0000 mg | Freq: Once | INTRAMUSCULAR | Status: AC
  Administered 2014-03-27: 4 mg via INTRAVENOUS
  Filled 2014-03-27: qty 2

## 2014-03-27 MED ORDER — LORAZEPAM 2 MG/ML IJ SOLN
1.0000 mg | Freq: Once | INTRAMUSCULAR | Status: AC
  Administered 2014-03-27: 1 mg via INTRAVENOUS
  Filled 2014-03-27: qty 1

## 2014-03-27 NOTE — Discharge Instructions (Signed)
Return to the ED with any concerns including vomiting and not able to keep down liquids, fever/chills, worsening pain not controlled by pain meds, decreased level of alertness/lethargy, or any other alarming symptoms  The CT scan showed an incidental finding of lung nodule- you should have a repeat Chest CT performed in 6-12 months- please discuss this with your primary care doctor

## 2014-03-27 NOTE — ED Notes (Signed)
Per pt, states he was at doctors office with a complaint of right flank/back continuous pain-nausea-feels like she has to have a BM but cant

## 2014-03-27 NOTE — ED Notes (Signed)
Sats 84% on RA. Pt placed on 2 L [place] Sats 96%. Pt in no distress and does not feel short of breath.

## 2014-03-27 NOTE — ED Provider Notes (Signed)
CSN: [id number]     Arrival date & time 03/27/14  1338 History   First MD Initiated Contact with Patient 03/27/14 1503     Chief Complaint  Patient presents with  . Flank Pain     (Consider location/radiation/quality/duration/timing/severity/associated sxs/prior Treatment) HPI  Pt presenting with nausea/vomiting and right sided low back pain which radiates into right lower abdomen. Pt noted the pain yesterday and thought it was muscle strain due to shoveling snow.  He began to have nausea/vomiting today with pain.  He was seen by PMD this morning for check up and didn't mention his symptoms as he wasn't feeling very poorly.  However as symptoms worsened he went back to PMD and was advised to be seen in the ED.  No fever/chills.  No difficulty urinating, no dysuria.  Has not seen blood in urine.  There are no other associated systemic symptoms, there are no other alleviating or modifying factors.   Past Medical History  Diagnosis Date  . Hyperlipidemia     Crestor 3 x wk and Zetia daily  . Hx of cardiac cath   . Coronary atherosclerosis of native coronary artery     2009 LAD CIRC DES  . Chronic kidney disease     stage 111  . PONV (postoperative nausea and vomiting)     pt states extremely sick  . Hypertension     takes Hyzaar daily  . Shortness of breath     with exertion  . Headache(784.0)   . Dizziness   . Joint pain   . History of gout     doesn't require meds  . Cancer     nose/Dr. Albertini;skin  . H/O hiatal hernia   . GERD (gastroesophageal reflux disease)     TUMS prn  . Chronic constipation   . Enlarged prostate     but doesn't require meds at present  . Diabetes mellitus     takes Januvia daily  . Anxiety   . Depression     doesn't take any meds for this  . Insomnia     doesn't require meds at present time  . Onychomycosis 10/05/2012    x 10  . Bunion 09/29/2011   Past Surgical History  Procedure Laterality Date  . Cholecystectomy    . Eye surgery       bilateral cataracts  . Hemorrhoid surgery    . Rotator cuff surgery      right   . Hernia repair      x 2;inguinal  . Cyst removed from finger      right hand  . Cataracts removed    . Skin cancer removed    . Cardiac catheterization  2013  . Coronary angioplasty      2 stents from 2009  . Colonoscopy    . Coronary artery bypass graft  10/29/2011    Procedure: CORONARY ARTERY BYPASS GRAFTING (CABG);  Surgeon: Ivin Poot, MD;  Location: [facility];  Service: Open Heart Surgery;  Laterality: N/A;  . Left heart catheterization with coronary angiogram N/A 10/21/2011    Procedure: LEFT HEART CATHETERIZATION WITH CORONARY ANGIOGRAM;  Surgeon: Candee Furbish, MD;  Location: [facility] CATH LAB;  Service: Cardiovascular;  Laterality: N/A;   Family History  Problem Relation Age of Onset  . Stroke Father   . Cancer Brother    History  Substance Use Topics  . Smoking status: Former Research scientist (life sciences)  . Smokeless tobacco: Never Used     Comment: quit in  1986  . Alcohol Use: No    Review of Systems  ROS reviewed and all otherwise negative except for mentioned in HPI    Allergies  Crestor and Zocor  Home Medications   Prior to Admission medications   Medication Sig Start Date End Date Taking? Authorizing Provider  aspirin 325 MG tablet Take 325 mg by mouth daily.   Yes Historical Provider, MD  atorvastatin (LIPITOR) 80 MG tablet Take 80 mg by mouth daily. Patient says he takes it 2 times a week 10/04/13  Yes Candee Furbish, MD  calcium carbonate (TUMS - DOSED IN MG ELEMENTAL CALCIUM) 500 MG chewable tablet Chew 2 tablets by mouth daily as needed. For indigestion.    Yes Historical Provider, MD  Coenzyme Q10-Vitamin E 100-1 MG-UNT/5ML SYRP Take 100 mg by mouth daily. Take 2 tablespoons (100MG) Daily   Yes Historical Provider, MD  fish oil-omega-3 fatty acids 1000 MG capsule Take 1,200 mg by mouth daily. 4 1200 tablets daily   Yes Historical Provider, MD  fluticasone (FLONASE) 50 MCG/ACT nasal spray Place 1  spray into both nostrils daily as needed for allergies or rhinitis.   Yes Historical Provider, MD  glimepiride (AMARYL) 2 MG tablet Take 1 tablet (2 mg total) by mouth daily. 03/16/14  Yes Elayne Snare, MD  losartan-hydrochlorothiazide (HYZAAR) 100-25 MG per tablet Take 1 tablet by mouth daily. 03/30/13  Yes Candee Furbish, MD  metFORMIN (GLUCOPHAGE-XR) 500 MG 24 hr tablet Take 1 tablet (500 mg total) by mouth daily with supper. 03/16/14  Yes Elayne Snare, MD  metoprolol tartrate (LOPRESSOR) 25 MG tablet TAKE 1 TABLET BY MOUTH TWICE DAILY 03/21/14  Yes Candee Furbish, MD  ondansetron (ZOFRAN) 4 MG tablet Take 1 tablet (4 mg total) by mouth every 6 (six) hours. 03/27/14   Threasa Beards, MD  oxyCODONE-acetaminophen (PERCOCET/ROXICET) 5-325 MG per tablet Take 1-2 tablets by mouth every 6 (six) hours as needed for severe pain. 03/27/14   Threasa Beards, MD  tamsulosin (FLOMAX) 0.4 MG CAPS capsule Take 1 capsule (0.4 mg total) by mouth daily. 03/27/14   Threasa Beards, MD   BP 157/86 mmHg  Pulse 74  Temp(Src) 98.9 F (37.2 C) (Oral)  Resp 18  SpO2 94%  Vitals reviewed Physical Exam  Physical Examination: General appearance - alert, well appearing, and in no distress Mental status - alert, oriented to person, place, and time Eyes - no conjunctival injection, no scleral icterus Mouth - mucous membranes moist, pharynx normal without lesions Chest - clear to auscultation, no wheezes, rales or rhonchi, symmetric air entry Heart - normal rate, regular rhythm, normal S1, S2, no murmurs, rubs, clicks or gallops Abdomen - soft,mild ttp in right lower abdomen and suprapubic region, nondistended, no masses or organomegaly Back exam - no midline tenderness to palpation, mild right sided CVA tenderness Extremities - peripheral pulses normal, no pedal edema, no clubbing or cyanosis Skin - normal coloration and turgor, no rashes  ED Course  Procedures (including critical care time) Labs Review Labs Reviewed  BASIC  METABOLIC PANEL - Abnormal; Notable for the following:    Glucose, Bld 260 (*)    BUN 27 (*)    Creatinine, Ser 1.62 (*)    GFR calc non Af Amer 38 (*)    GFR calc Af Amer 44 (*)    All other components within normal limits  CBC WITH DIFFERENTIAL/PLATELET - Abnormal; Notable for the following:    Neutrophils Relative % 81 (*)    Neutro  Abs 8.4 (*)    All other components within normal limits  URINALYSIS, ROUTINE W REFLEX MICROSCOPIC - Abnormal; Notable for the following:    Color, Urine RED (*)    APPearance TURBID (*)    Glucose, UA 500 (*)    Hgb urine dipstick LARGE (*)    Protein, ur 30 (*)    Leukocytes, UA SMALL (*)    All other components within normal limits  URINE MICROSCOPIC-ADD ON - Abnormal; Notable for the following:    Squamous Epithelial / LPF FEW (*)    Bacteria, UA MANY (*)    All other components within normal limits  URINE CULTURE    Imaging Review Ct Abdomen Pelvis Wo Contrast  03/27/2014   CLINICAL DATA:  81 year old with right flank pain that started this morning. History of cholecystectomy.  EXAM: CT ABDOMEN AND PELVIS WITHOUT CONTRAST  TECHNIQUE: Multidetector CT imaging of the abdomen and pelvis was performed following the standard protocol without IV contrast.  COMPARISON:  Lumbar spine radiographs 01/06/2010  FINDINGS: There is a 5 mm nodule at the posterior left lung base on sequence 6, image 4. Punctate pleural-based nodule in the left lower lobe on image 5. Question a punctate nodule in the lingula on image 1. Negative for free intraperitoneal air.  1.0 cm low-density structure in the left hepatic lobe is nonspecific but could represent a cyst. Otherwise, no gross abnormality to the liver. The gallbladder has been removed. There is a large calcification between the colon and the right hepatic lobe that measures 1.3 cm. Normal appearance of the spleen, pancreas and adrenal glands. There is a small hiatal hernia.  There are subtle densities in the left kidney  without definite left kidney stones. Negative for left hydronephrosis or left ureter stones. Normal appearance of the urinary bladder. There is mild to moderate right hydronephrosis due to a small stone in the proximal right ureter. Right ureter stone measures 4 mm. There is mild stranding around the right renal hilum and proximal right ureter. There is a large low-density structure involving the right kidney suggestive for large cyst. Structure measures up to 7.4 cm and has a punctate calcification along the periphery of the cyst.  Prostate is prominent measuring 4.3 x 4.9 x 5.0 cm. Atherosclerotic calcifications in the abdominal aorta without aneurysm. Normal appearance of the appendix. No acute abnormality to the small or large bowel.  There is marked disc space narrowing at L3-L4 with bony erosions of the L3 and L4 endplates. This is a significant change from a lumbar spine examination in 2011. There is no significant soft tissue swelling or inflammation around L3-L4. There is vacuum disc phenomenon at L4-L5 and L5-S1. Grade 1 anterolisthesis L4-L5. Disc space need narrowing at T11-T12.  IMPRESSION: Mild to moderate right hydronephrosis due to a 4 mm stone in the proximal right ureter.  Marked disc space disease at L3-L4 with bony erosions of the endplates at V7-Q4. Findings could represent severe degenerative disease but infection and discitis cannot be excluded. If there is concern for inflammatory disease or discitis at this level, recommend further evaluation with a MRI.  Large right renal cyst with a calcification. This cyst is incompletely evaluated on this non contrast examination.  Small pulmonary nodules at the left lung base. Largest pulmonary nodule measures 5 mm. If the patient is at high risk for bronchogenic carcinoma, follow-up chest CT at 6-12 months is recommended. If the patient is at low risk for bronchogenic carcinoma, follow-up chest CT at  12 months is recommended. This recommendation  follows the consensus statement: Guidelines for Management of Small Pulmonary Nodules Detected on CT Scans: A Statement from the [employer] as published in Radiology 2005;237:395-400.   Electronically Signed   By: Markus Daft M.D.   On: 03/27/2014 17:02   Mr Lumbar Spine W Wo Contrast  03/27/2014   CLINICAL DATA:  RIGHT back and flank pain, nausea. No radiculopathy. Symptoms began yesterday while shoveling snow.  EXAM: MRI LUMBAR SPINE WITHOUT AND WITH CONTRAST  TECHNIQUE: Multiplanar and multiecho pulse sequences of the lumbar spine were obtained without and with intravenous contrast.  CONTRAST:  65m MULTIHANCE GADOBENATE DIMEGLUMINE 529 MG/ML IV SOLN  COMPARISON:  CT of the abdomen and pelvis March 27, 2014 at 1624 hr  FINDINGS: Lumbar vertebral bodies and posterior elements intact. Using the reference level of the last well-formed intervertebral disc as L5-S1, grade 1 L4-5 anterolisthesis. No spondylolysis. Moderate L3-4 disc height loss, moderate irregular discogenic endplate changes with faint STIR signal. Mild associated enhancement, which does not extend into the disc. Mild bright STIR signal within the disc. Moderate L4-5 disc height loss, decreased T2 signal within all the lumbar discs consistent with desiccation, vacuum disc L4-5 and L5-S1.  Included view of the thoracic spine demonstrates acute on chronic discogenic endplate changes [id number] mild enhancement. Additional mildly enhancing Schmorl's nodes.  Conus medullaris terminates at L1 appears normal in morphology and signal characteristics. Cauda equina is unremarkable. No abnormal cord, leptomeningeal or epidural enhancement. Paraspinal soft tissues are normal. Partially imaged RIGHT renal cysts and, mild hydronephrosis.  Level by level evaluation:  T12-L1, L1-2: No disc bulge, canal stenosis or neural foraminal narrowing.  L2-3: Annular bulging, mild facet arthropathy and ligamentum flavum redundancy without canal stenosis. Mild  bilateral caudal neural foraminal narrowing.  L3-4: 4 mm broad-based disc bulge asymmetric to the RIGHT, encroaches upon the bilateral exited L3 nerve . Severe RIGHT, moderate to severe LEFT facet arthropathy and ligamentum flavum redundancy result in mild-to-moderate canal stenosis, partial effacement of RIGHT lateral recess likely affect the traversing RIGHT L4 nerve. Moderate to severe RIGHT, mild LEFT neural foraminal narrowing.  L4-5: Anterolisthesis. 5 mm broad-based disc bulge, severe facet arthropathy and ligamentum flavum redundancy with trace facet effusions which are likely reactive. Moderate canal stenosis including partial effacement LEFT greater than RIGHT lateral recesses which likely affect the traversing L5 nerves. Moderate RIGHT, moderate to severe LEFT neural foraminal narrowing.  L5-S1: 3 mm broad-based disc bulge, moderate to severe facet arthropathy and ligamentum flavum redundancy without canal stenosis. Very mild neural foraminal narrowing.  IMPRESSION: Degenerative change of the lumbar spine, most conspicuous at T10-11 and L3-4 with acute on chronic changes. No MR findings of discitis osteomyelitis. If clinical concern persists, recommend correlation with ESR/ CRP.  Grade 1 L4-5 anterolisthesis on degenerative basis.  No fracture.  Moderate canal stenosis L4-5, mild to moderate at L3-4.  Neural foraminal narrowing L2-3 through L5-S1: Moderate to severe on the RIGHT at L3-4 and on the LEFT at L4-5.   Electronically Signed   By: CElon Alas  On: 03/27/2014 21:34     EKG Interpretation   Date/Time:  Tuesday March 27 2014 17:19:40 EST Ventricular Rate:  76 PR Interval:  330 QRS Duration: 93 QT Interval:  419 QTC Calculation: 471 R Axis:   55 Text Interpretation:  Sinus rhythm Prolonged PR interval Probable left  atrial enlargement Since previous tracing afib is no longer present and  first degree AV block is now  present Confirmed by [facility]  MD, Allen  [phone]) on  03/27/2014 5:26:35 PM      MDM   Final diagnoses:  Back pain  Right ureteral stone    Pt presenting with back pain radiating to lower abdomen, nausea/vomiting presenting- labs are reassuring the the exeption of mild renal insufficiency at baseline, RBCs in urine.  CT scan shows right ureteral stone.  Also some concern for possible diskitis- recommended MRI- this was performed and showed no evidence of diskitis.  Pt's pain is controlled after IV meds in the ED.  Urine culture pending.  Pt given followup information for urology.  Pt advised of findings of pulmonary nodules.  Discharged with strict return precautions.  Pt agreeable with plan.    Threasa Beards, MD 03/28/14 [phone]

## 2014-03-29 LAB — URINE CULTURE
Colony Count: NO GROWTH
Culture: NO GROWTH

## 2014-04-16 ENCOUNTER — Ambulatory Visit: Payer: Medicare Other | Admitting: Podiatry

## 2014-04-23 ENCOUNTER — Other Ambulatory Visit (INDEPENDENT_AMBULATORY_CARE_PROVIDER_SITE_OTHER): Payer: Medicare Other

## 2014-04-23 DIAGNOSIS — E1165 Type 2 diabetes mellitus with hyperglycemia: Secondary | ICD-10-CM

## 2014-04-23 DIAGNOSIS — N200 Calculus of kidney: Secondary | ICD-10-CM | POA: Insufficient documentation

## 2014-04-23 DIAGNOSIS — IMO0002 Reserved for concepts with insufficient information to code with codable children: Secondary | ICD-10-CM

## 2014-04-23 LAB — BASIC METABOLIC PANEL
BUN: 21 mg/dL (ref 6–23)
CO2: 29 mEq/L (ref 19–32)
Calcium: 9.5 mg/dL (ref 8.4–10.5)
Chloride: 101 mEq/L (ref 96–112)
Creatinine, Ser: 1.27 mg/dL (ref 0.40–1.50)
GFR: 57.77 mL/min — ABNORMAL LOW (ref >60.00)
Glucose, Bld: 119 mg/dL — ABNORMAL HIGH (ref 70–99)
Potassium: 4.1 mEq/L (ref 3.5–5.1)
Sodium: 137 mEq/L (ref 135–145)

## 2014-04-24 LAB — FRUCTOSAMINE: Fructosamine: 247 umol/L (ref 0–285)

## 2014-04-25 ENCOUNTER — Encounter: Payer: Self-pay | Admitting: Podiatry

## 2014-04-25 ENCOUNTER — Ambulatory Visit (INDEPENDENT_AMBULATORY_CARE_PROVIDER_SITE_OTHER): Payer: Medicare Other | Admitting: Podiatry

## 2014-04-25 DIAGNOSIS — M79676 Pain in unspecified toe(s): Secondary | ICD-10-CM

## 2014-04-25 DIAGNOSIS — B351 Tinea unguium: Secondary | ICD-10-CM

## 2014-04-25 NOTE — Progress Notes (Signed)
Patient ID: Cody Thomas, male   DOB: 06/14/1930, 81 y.o.   MRN: [id number]  Subjective: This patient presents complaining of painful toenails and requests debridement  Objective: The toenails are elongated, hypertrophic, discolored, and tender to palpation 6-10  Assessment: Symptomatic onychomycoses 6-10  Plan: Debridement of toenails 10 without any bleeding  Reappoint 3 months

## 2014-04-25 NOTE — Patient Instructions (Signed)
Diabetes and Foot Care Diabetes may cause you to have problems because of poor blood supply (circulation) to your feet and legs. This may cause the skin on your feet to become thinner, break easier, and heal more slowly. Your skin may become dry, and the skin may peel and crack. You may also have nerve damage in your legs and feet causing decreased feeling in them. You may not notice minor injuries to your feet that could lead to infections or more serious problems. Taking care of your feet is one of the most important things you can do for yourself.  HOME CARE INSTRUCTIONS  Wear shoes at all times, even in the house. Do not go barefoot. Bare feet are easily injured.  Check your feet daily for blisters, cuts, and redness. If you cannot see the bottom of your feet, use a mirror or ask someone for help.  Wash your feet with warm water (do not use hot water) and mild soap. Then pat your feet and the areas between your toes until they are completely dry. Do not soak your feet as this can dry your skin.  Apply a moisturizing lotion or petroleum jelly (that does not contain alcohol and is unscented) to the skin on your feet and to dry, brittle toenails. Do not apply lotion between your toes.  Trim your toenails straight across. Do not dig under them or around the cuticle. File the edges of your nails with an emery board or nail file.  Do not cut corns or calluses or try to remove them with medicine.  Wear clean socks or stockings every day. Make sure they are not too tight. Do not wear knee-high stockings since they may decrease blood flow to your legs.  Wear shoes that fit properly and have enough cushioning. To break in new shoes, wear them for just a few hours a day. This prevents you from injuring your feet. Always look in your shoes before you put them on to be sure there are no objects inside.  Do not cross your legs. This may decrease the blood flow to your feet.  If you find a minor scrape,  cut, or break in the skin on your feet, keep it and the skin around it clean and dry. These areas may be cleansed with mild soap and water. Do not cleanse the area with peroxide, alcohol, or iodine.  When you remove an adhesive bandage, be sure not to damage the skin around it.  If you have a wound, look at it several times a day to make sure it is healing.  Do not use heating pads or hot water bottles. They may burn your skin. If you have lost feeling in your feet or legs, you may not know it is happening until it is too late.  Make sure your health care provider performs a complete foot exam at least annually or more often if you have foot problems. Report any cuts, sores, or bruises to your health care provider immediately. SEEK MEDICAL CARE IF:   You have an injury that is not healing.  You have cuts or breaks in the skin.  You have an ingrown nail.  You notice redness on your legs or feet.  You feel burning or tingling in your legs or feet.  You have pain or cramps in your legs and feet.  Your legs or feet are numb.  Your feet always feel cold. SEEK IMMEDIATE MEDICAL CARE IF:   There is increasing redness,   swelling, or pain in or around a wound.  There is a red line that goes up your leg.  Pus is coming from a wound.  You develop a fever or as directed by your health care provider.  You notice a bad smell coming from an ulcer or wound. Document Released: 02/14/2000 Document Revised: 10/19/2012 Document Reviewed: 07/26/2012 Exit[employer] Patient Information 2015 [employer]. This information is not intended to replace advice given to you by your health care provider. Make sure you discuss any questions you have with your health care provider.  

## 2014-04-26 ENCOUNTER — Ambulatory Visit (INDEPENDENT_AMBULATORY_CARE_PROVIDER_SITE_OTHER): Payer: Medicare Other | Admitting: Endocrinology

## 2014-04-26 ENCOUNTER — Encounter: Payer: Self-pay | Admitting: Endocrinology

## 2014-04-26 VITALS — BP 136/82 | HR 71 | Temp 98.3°F | Ht 66.0 in | Wt 198.0 lb

## 2014-04-26 DIAGNOSIS — E1165 Type 2 diabetes mellitus with hyperglycemia: Secondary | ICD-10-CM

## 2014-04-26 DIAGNOSIS — IMO0002 Reserved for concepts with insufficient information to code with codable children: Secondary | ICD-10-CM

## 2014-04-26 NOTE — Patient Instructions (Signed)
Take 1/2 Glimeperide before bfst daily  Take 2 Metformin daily at supper  Please check blood sugars at least half the time about 2 hours after any meal and 2-3 times per week on waking up. Please bring blood sugar monitor to each visit. Recommended blood sugar levels about 2 hours after meal is 140-180 and on waking up 90-130

## 2014-04-26 NOTE — Progress Notes (Signed)
Patient ID: Cody Thomas, male   DOB: 1930-07-13, 81 y.o.   MRN: [id number]   Reason for Appointment: Diabetes follow-up   History of Present Illness   Diagnosis: Type 2 DIABETES MELITUS  He has had mild diabetes which has been previously erratic partly because of dietary inconsistency and difficulty with weight loss He has not been continued on metformin because of renal dysfunction Was given low-dose Januvia instead but this was too expensive for him even though it was helping his control In 2014 he was given low dose Amaryl instead which appears to be keeping his blood sugars controlled   Recent history: His blood sugars were poorly controlled compared to previous levels on his follow-up in 1/60 He was on Amaryl on milligram only and had fairly consistently high readings after dinner and in the morning waking up. He thinks his blood sugars were out of control because of poor diet and lack of exercise  Because of his difficulty losing weight he was given metformin in addition and he is taking 500 mg daily without any side effects. Also his Amaryl was increased to 2 mg daily.  He was supposed to take this in the evening but is taking this in the morning now He has felt a little shaky a couple of times when blood sugar was 70 in the afternoon and also once when glucose was 91 fasting; otherwise no hypoglycemia Also he has been trying to avoid sweets. He tries to walk outside unless it is very cold Overall blood sugars are improving and fructosamine is back to normal            Monitors blood glucose: Once a day.    Glucometer:   Accu-Chek         Blood Glucose readings from meter   PRE-MEAL Breakfast Lunch Dinner Bedtime Overall  Glucose range: 91-139   70, 91     Mean/median:  120     135   POST-MEAL PC Breakfast PC Lunch PC Dinner  Glucose range:  126-253   99-157   110-176   Mean/median:             Meals: 3 meals per day.  Physical activity: exercise: walking  when he can            Wt Readings from Last 3 Encounters:  04/26/14 198 lb (89.812 kg)  03/16/14 201 lb 3.2 oz (91.264 kg)  02/09/14 201 lb (91.173 kg)   DM LABS:  Lab Results  Component Value Date   HGBA1C 7.8* 03/13/2014   HGBA1C 7.1* 11/09/2013   HGBA1C 7.0* 07/12/2013   Lab Results  Component Value Date   MICROALBUR 0.7 03/13/2014   LDLCALC 75 11/09/2013   CREATININE 1.27 04/23/2014   Appointment on 04/23/2014  Component Date Value Ref Range Status  . Sodium 04/23/2014 137  135 - 145 mEq/L Final  . Potassium 04/23/2014 4.1  3.5 - 5.1 mEq/L Final  . Chloride 04/23/2014 101  96 - 112 mEq/L Final  . CO2 04/23/2014 29  19 - 32 mEq/L Final  . Glucose, Bld 04/23/2014 119* 70 - 99 mg/dL Final  . BUN 40/98/1191 21  6 - 23 mg/dL Final  . Creatinine, Ser 04/23/2014 1.27  0.40 - 1.50 mg/dL Final  . Calcium 47/82/9562 9.5  8.4 - 10.5 mg/dL Final  . GFR 13/09/6576 57.77* >60.00 mL/min Final  . Fructosamine 04/23/2014 247  0 - 285 umol/L Final   Comment: Published reference interval for  apparently healthy subjects between age 53 and 52 is 59 - 285 umol/L and in a poorly controlled diabetic population is 228 - 563 umol/L with a mean of 396 umol/L.        Medication List       This list is accurate as of: 04/26/14  3:04 PM.  Always use your most recent med list.               aspirin 325 MG tablet  Take 325 mg by mouth daily.     atorvastatin 80 MG tablet  Commonly known as:  LIPITOR  Take 80 mg by mouth daily. Patient says he takes it 2 times a week     calcium carbonate 500 MG chewable tablet  Commonly known as:  TUMS - dosed in mg elemental calcium  Chew 2 tablets by mouth daily as needed. For indigestion.     Coenzyme Q10-Vitamin E 100-1 MG-UNT/5ML Syrp  Take 100 mg by mouth daily. Take 2 tablespoons (100MG ) Daily     fish oil-omega-3 fatty acids 1000 MG capsule  Take 1,200 mg by mouth daily. 4 1200 tablets daily     fluticasone 50 MCG/ACT nasal spray    Commonly known as:  FLONASE  Place 1 spray into both nostrils daily as needed for allergies or rhinitis.     glimepiride 2 MG tablet  Commonly known as:  AMARYL  Take 1 tablet (2 mg total) by mouth daily.     losartan-hydrochlorothiazide 100-25 MG per tablet  Commonly known as:  HYZAAR  Take 1 tablet by mouth daily.     metFORMIN 500 MG 24 hr tablet  Commonly known as:  GLUCOPHAGE-XR  Take 1 tablet (500 mg total) by mouth daily with supper.     metoprolol tartrate 25 MG tablet  Commonly known as:  LOPRESSOR  TAKE 1 TABLET BY MOUTH TWICE DAILY     ondansetron 4 MG tablet  Commonly known as:  ZOFRAN  Take 1 tablet (4 mg total) by mouth every 6 (six) hours.     oxyCODONE-acetaminophen 5-325 MG per tablet  Commonly known as:  PERCOCET/ROXICET  Take 1-2 tablets by mouth every 6 (six) hours as needed for severe pain.     tamsulosin 0.4 MG Caps capsule  Commonly known as:  FLOMAX  Take 1 capsule (0.4 mg total) by mouth daily.        Allergies:  Allergies  Allergen Reactions  . Crestor [Rosuvastatin]     Nightmares in high dosage  . Zocor [Simvastatin]     Nightmares in high dosage    Past Medical History  Diagnosis Date  . Hyperlipidemia     Crestor 3 x wk and Zetia daily  . Hx of cardiac cath   . Coronary atherosclerosis of native coronary artery     2009 LAD CIRC DES  . Chronic kidney disease     stage 111  . PONV (postoperative nausea and vomiting)     pt states extremely sick  . Hypertension     takes Hyzaar daily  . Shortness of breath     with exertion  . Headache(784.0)   . Dizziness   . Joint pain   . History of gout     doesn't require meds  . Cancer     nose/Dr. Albertini;skin  . H/O hiatal hernia   . GERD (gastroesophageal reflux disease)     TUMS prn  . Chronic constipation   . Enlarged prostate  but doesn't require meds at present  . Diabetes mellitus     takes Januvia daily  . Anxiety   . Depression     doesn't take any meds for  this  . Insomnia     doesn't require meds at present time  . Onychomycosis 10/05/2012    x 10  . Bunion 09/29/2011    Past Surgical History  Procedure Laterality Date  . Cholecystectomy    . Eye surgery      bilateral cataracts  . Hemorrhoid surgery    . Rotator cuff surgery      right   . Hernia repair      x 2;inguinal  . Cyst removed from finger      right hand  . Cataracts removed    . Skin cancer removed    . Cardiac catheterization  2013  . Coronary angioplasty      2 stents from 2009  . Colonoscopy    . Coronary artery bypass graft  10/29/2011    Procedure: CORONARY ARTERY BYPASS GRAFTING (CABG);  Surgeon: Kerin Perna, MD;  [facility] OR;  Service: Open Heart Surgery;  Laterality: N/A;  . Left heart catheterization with coronary angiogram N/A 10/21/2011    Procedure: LEFT HEART CATHETERIZATION WITH CORONARY ANGIOGRAM;  Surgeon: Donato Schultz, MD;  Location: [facility] CATH LAB;  Service: Cardiovascular;  Laterality: N/A;  . Kidney stone surgery    . Bladder cancer      Family History  Problem Relation Age of Onset  . Stroke Father   . Cancer Brother     Social History:  reports that he has quit smoking. He has never used smokeless tobacco. He reports that he does not drink alcohol or use illicit drugs.  Review of Systems:  HYPERTENSION:  fairly well-controlled on losartan HCT   HYPERLIPIDEMIA: The lipid abnormality consists of elevated LDL and low HDL treated with Lipitor   He did not want to continue Crestor and Zetia and is only taking Lipitor He says that the 80 mg dose prescribed by cardiologist causes nightmares  Currently taking only 40 mg, not everyday; muscle aches are better with using CoQ10   Lab Results  Component Value Date   CHOL 126 11/09/2013   HDL 28.70* 11/09/2013   LDLCALC 75 11/09/2013   TRIG 113.0 11/09/2013   CHOLHDL 4 11/09/2013        Examination:   BP 136/82 mmHg  Pulse 71  Temp(Src) 98.3 F (36.8 C) (Oral)  Ht 5\' 6"  (1.676  m)  Wt 198 lb (89.812 kg)  BMI 31.97 kg/m2  SpO2 95%  Body mass index is 31.97 kg/(m^2).   ASSESSMENT/ PLAN:   Diabetes type 2   The patient's diabetes control appears to be significantly better with increasing his Amaryl and adding low dose metformin. He thinks he has felt hypoglycemic couple of times even with blood sugars not below 70 He has been more motivated to watch his diet consistently Also trying to walk when the weather is good  Since his kidney function is reasonably good and he is benefiting from metformin he will increase the dose to 1000 mg Meanwhile will reduce his Amaryl to 1 mg and potentially stop this  Discussed blood sugar targets   Patient Instructions  Take 1/2 Glimeperide before bfst daily  Take 2 Metformin daily at supper  Please check blood sugars at least half the time about 2 hours after any meal and 2-3 times per week on waking up.  Please bring blood sugar monitor to each visit. Recommended blood sugar levels about 2 hours after meal is 140-180 and on waking up 90-130     Sweet Jarvis 04/26/2014, 3:04 PM

## 2014-05-28 ENCOUNTER — Other Ambulatory Visit: Payer: Self-pay

## 2014-05-28 MED ORDER — LOSARTAN POTASSIUM-HCTZ 100-25 MG PO TABS: 1.0000 | ORAL_TABLET | Freq: Every day | ORAL | Status: DC

## 2014-06-19 ENCOUNTER — Other Ambulatory Visit: Payer: Self-pay | Admitting: *Deleted

## 2014-06-19 ENCOUNTER — Telehealth: Payer: Self-pay | Admitting: Endocrinology

## 2014-06-19 MED ORDER — METFORMIN HCL ER 500 MG PO TB24: 500.0000 mg | ORAL_TABLET | Freq: Every day | ORAL | Status: DC

## 2014-06-19 NOTE — Telephone Encounter (Signed)
Patient need refill of metformin

## 2014-06-19 NOTE — Telephone Encounter (Signed)
rx sent

## 2014-06-22 ENCOUNTER — Other Ambulatory Visit: Payer: Self-pay | Admitting: *Deleted

## 2014-06-22 ENCOUNTER — Telehealth: Payer: Self-pay | Admitting: Endocrinology

## 2014-06-22 MED ORDER — METFORMIN HCL ER 500 MG PO TB24: ORAL_TABLET | ORAL | Status: DC

## 2014-06-22 NOTE — Telephone Encounter (Signed)
Error

## 2014-07-20 ENCOUNTER — Other Ambulatory Visit: Payer: Medicare Other

## 2014-07-25 ENCOUNTER — Ambulatory Visit (INDEPENDENT_AMBULATORY_CARE_PROVIDER_SITE_OTHER): Payer: Medicare Other | Admitting: Podiatry

## 2014-07-25 ENCOUNTER — Ambulatory Visit: Payer: Medicare Other | Admitting: Endocrinology

## 2014-07-25 DIAGNOSIS — M79676 Pain in unspecified toe(s): Secondary | ICD-10-CM

## 2014-07-25 DIAGNOSIS — B351 Tinea unguium: Secondary | ICD-10-CM | POA: Diagnosis not present

## 2014-07-25 NOTE — Patient Instructions (Signed)
Diabetes and Foot Care Diabetes may cause you to have problems because of poor blood supply (circulation) to your feet and legs. This may cause the skin on your feet to become thinner, break easier, and heal more slowly. Your skin may become dry, and the skin may peel and crack. You may also have nerve damage in your legs and feet causing decreased feeling in them. You may not notice minor injuries to your feet that could lead to infections or more serious problems. Taking care of your feet is one of the most important things you can do for yourself.  HOME CARE INSTRUCTIONS  Wear shoes at all times, even in the house. Do not go barefoot. Bare feet are easily injured.  Check your feet daily for blisters, cuts, and redness. If you cannot see the bottom of your feet, use a mirror or ask someone for help.  Wash your feet with warm water (do not use hot water) and mild soap. Then pat your feet and the areas between your toes until they are completely dry. Do not soak your feet as this can dry your skin.  Apply a moisturizing lotion or petroleum jelly (that does not contain alcohol and is unscented) to the skin on your feet and to dry, brittle toenails. Do not apply lotion between your toes.  Trim your toenails straight across. Do not dig under them or around the cuticle. File the edges of your nails with an emery board or nail file.  Do not cut corns or calluses or try to remove them with medicine.  Wear clean socks or stockings every day. Make sure they are not too tight. Do not wear knee-high stockings since they may decrease blood flow to your legs.  Wear shoes that fit properly and have enough cushioning. To break in new shoes, wear them for just a few hours a day. This prevents you from injuring your feet. Always look in your shoes before you put them on to be sure there are no objects inside.  Do not cross your legs. This may decrease the blood flow to your feet.  If you find a minor scrape,  cut, or break in the skin on your feet, keep it and the skin around it clean and dry. These areas may be cleansed with mild soap and water. Do not cleanse the area with peroxide, alcohol, or iodine.  When you remove an adhesive bandage, be sure not to damage the skin around it.  If you have a wound, look at it several times a day to make sure it is healing.  Do not use heating pads or hot water bottles. They may burn your skin. If you have lost feeling in your feet or legs, you may not know it is happening until it is too late.  Make sure your health care provider performs a complete foot exam at least annually or more often if you have foot problems. Report any cuts, sores, or bruises to your health care provider immediately. SEEK MEDICAL CARE IF:   You have an injury that is not healing.  You have cuts or breaks in the skin.  You have an ingrown nail.  You notice redness on your legs or feet.  You feel burning or tingling in your legs or feet.  You have pain or cramps in your legs and feet.  Your legs or feet are numb.  Your feet always feel cold. SEEK IMMEDIATE MEDICAL CARE IF:   There is increasing redness,   swelling, or pain in or around a wound.  There is a red line that goes up your leg.  Pus is coming from a wound.  You develop a fever or as directed by your health care provider.  You notice a bad smell coming from an ulcer or wound. Document Released: 02/14/2000 Document Revised: 10/19/2012 Document Reviewed: 07/26/2012 Exit[employer] Patient Information 2015 [employer]. This information is not intended to replace advice given to you by your health care provider. Make sure you discuss any questions you have with your health care provider.  

## 2014-07-25 NOTE — Progress Notes (Signed)
Patient ID: Cody Thomas, male   DOB: Feb 20, 1931, 81 y.o.   MRN: [id number]  Subjective: This patient presents again complaining of painful toenails and requesting nail debridement  Objective: The toenails are hypertrophic, elongated, discolored, incurvated and tender direct palpation 6-10  Assessment: Symptomatic onychomycoses 6-10 Type II diabetic  Plan: Debrided toenails 10 without any bleeding  Reappoint 3 months

## 2014-08-06 ENCOUNTER — Other Ambulatory Visit (INDEPENDENT_AMBULATORY_CARE_PROVIDER_SITE_OTHER): Payer: Medicare Other

## 2014-08-06 DIAGNOSIS — E1165 Type 2 diabetes mellitus with hyperglycemia: Secondary | ICD-10-CM

## 2014-08-06 DIAGNOSIS — IMO0002 Reserved for concepts with insufficient information to code with codable children: Secondary | ICD-10-CM

## 2014-08-06 LAB — COMPREHENSIVE METABOLIC PANEL
ALT: 22 U/L (ref 0–53)
AST: 22 U/L (ref 0–37)
Albumin: 4.1 g/dL (ref 3.5–5.2)
Alkaline Phosphatase: 42 U/L (ref 39–117)
BUN: 19 mg/dL (ref 6–23)
CO2: 30 mEq/L (ref 19–32)
Calcium: 9.9 mg/dL (ref 8.4–10.5)
Chloride: 103 mEq/L (ref 96–112)
Creatinine, Ser: 1.43 mg/dL (ref 0.40–1.50)
GFR: 50.34 mL/min — ABNORMAL LOW (ref >60.00)
Glucose, Bld: 115 mg/dL — ABNORMAL HIGH (ref 70–99)
Potassium: 4 mEq/L (ref 3.5–5.1)
Sodium: 138 mEq/L (ref 135–145)
Total Bilirubin: 0.6 mg/dL (ref 0.2–1.2)
Total Protein: 7.1 g/dL (ref 6.0–8.3)

## 2014-08-06 LAB — HEMOGLOBIN A1C: Hgb A1c MFr Bld: 6.3 % (ref 4.6–6.5)

## 2014-08-09 ENCOUNTER — Ambulatory Visit (INDEPENDENT_AMBULATORY_CARE_PROVIDER_SITE_OTHER): Payer: Medicare Other | Admitting: Endocrinology

## 2014-08-09 ENCOUNTER — Encounter: Payer: Self-pay | Admitting: Endocrinology

## 2014-08-09 VITALS — BP 136/84 | HR 66 | Temp 98.0°F | Resp 14 | Ht 66.0 in | Wt 200.0 lb

## 2014-08-09 DIAGNOSIS — E785 Hyperlipidemia, unspecified: Secondary | ICD-10-CM

## 2014-08-09 DIAGNOSIS — C679 Malignant neoplasm of bladder, unspecified: Secondary | ICD-10-CM | POA: Insufficient documentation

## 2014-08-09 DIAGNOSIS — E119 Type 2 diabetes mellitus without complications: Secondary | ICD-10-CM | POA: Diagnosis not present

## 2014-08-09 NOTE — Patient Instructions (Signed)
Reduce dairy fat, saturated and trans fats in diet (donuts etc)  Walk daily

## 2014-08-09 NOTE — Progress Notes (Signed)
Patient ID: Cody Thomas, male   DOB: Sep 22, 1930, 81 y.o.   MRN: [id number]   Reason for Appointment: Diabetes follow-up   History of Present Illness   Diagnosis: Type 2 DIABETES MELITUS  He has had mild diabetes which has been previously erratic partly because of dietary inconsistency and difficulty with weight loss He has not been continued on metformin because of renal dysfunction Was given low-dose Januvia instead but this was too expensive for him even though it was helping his control In 2014 he was given low dose Amaryl instead which appears to be keeping his blood sugars controlled   Recent history: His blood sugars were not well controlled when he was seen in 1/16 with A1c 7.8 Because of his difficulty losing weight he was given metformin in addition to his Amaryl and he is now taking 1000 mg daily without any side effects. Also his Amaryl was reduced to 1 mg daily since he was starting to feel a little shaky at times in the afternoon with blood sugar around 70  More recently his blood sugars have been overall fairly good although periodically high after lunch or dinner if he is not watching his diet Generally take the Amaryl in the morning A1c is now excellent at 6.3            Monitors blood glucose: Once a day or less .    Glucometer:   Accu-Chek         Blood Glucose readings from meter   Mean values apply above for all meters except median for One Touch  PRE-MEAL Fasting Lunch Dinner Bedtime Overall  Glucose range:  106-150     188    Mean/median:  121      134    POST-MEAL PC Breakfast PC Lunch PC Dinner  Glucose range:  150-169   104-221   131-168   Mean/median:         Physical activity: exercise: farming mostly Diet: some donuts            Wt Readings from Last 3 Encounters:  08/09/14 200 lb (90.719 kg)  04/26/14 198 lb (89.812 kg)  03/16/14 201 lb 3.2 oz (91.264 kg)   DM LABS:  Lab Results  Component Value Date   HGBA1C 6.3 08/06/2014    HGBA1C 7.8* 03/13/2014   HGBA1C 7.1* 11/09/2013   Lab Results  Component Value Date   MICROALBUR 0.7 03/13/2014   LDLCALC 75 11/09/2013   CREATININE 1.43 08/06/2014   Appointment on 08/06/2014  Component Date Value Ref Range Status  . Hgb A1c MFr Bld 08/06/2014 6.3  4.6 - 6.5 % Final   Glycemic Control Guidelines for People with Diabetes:Non Diabetic:  <6%Goal of Therapy: <7%Additional Action Suggested:  >8%   . Sodium 08/06/2014 138  135 - 145 mEq/L Final  . Potassium 08/06/2014 4.0  3.5 - 5.1 mEq/L Final  . Chloride 08/06/2014 103  96 - 112 mEq/L Final  . CO2 08/06/2014 30  19 - 32 mEq/L Final  . Glucose, Bld 08/06/2014 115* 70 - 99 mg/dL Final  . BUN 86/57/8469 19  6 - 23 mg/dL Final  . Creatinine, Ser 08/06/2014 1.43  0.40 - 1.50 mg/dL Final  . Total Bilirubin 08/06/2014 0.6  0.2 - 1.2 mg/dL Final  . Alkaline Phosphatase 08/06/2014 42  39 - 117 U/L Final  . AST 08/06/2014 22  0 - 37 U/L Final  . ALT 08/06/2014 22  0 - 53 U/L Final  .  Total Protein 08/06/2014 7.1  6.0 - 8.3 g/dL Final  . Albumin 16/12/9602 4.1  3.5 - 5.2 g/dL Final  . Calcium 54/10/8117 9.9  8.4 - 10.5 mg/dL Final  . GFR 14/78/2956 50.34* >60.00 mL/min Final       Medication List       This list is accurate as of: 08/09/14 11:59 PM.  Always use your most recent med list.               aspirin 325 MG tablet  Take 325 mg by mouth daily.     atorvastatin 80 MG tablet  Commonly known as:  LIPITOR  Take 80 mg by mouth daily. Patient says he takes it 2 times a week     calcium carbonate 500 MG chewable tablet  Commonly known as:  TUMS - dosed in mg elemental calcium  Chew 2 tablets by mouth daily as needed. For indigestion.     Coenzyme Q10-Vitamin E 100-1 MG-UNT/5ML Syrp  Take 100 mg by mouth daily. Take 2 tablespoons (100MG ) Daily     fish oil-omega-3 fatty acids 1000 MG capsule  Take 1,200 mg by mouth daily. 4 1200 tablets daily     fluticasone 50 MCG/ACT nasal spray  Commonly known as:   FLONASE  Place 1 spray into both nostrils daily as needed for allergies or rhinitis.     glimepiride 2 MG tablet  Commonly known as:  AMARYL  Take 1 tablet (2 mg total) by mouth daily.     losartan-hydrochlorothiazide 100-25 MG per tablet  Commonly known as:  HYZAAR  Take 1 tablet by mouth daily.     metFORMIN 500 MG 24 hr tablet  Commonly known as:  GLUCOPHAGE-XR  Take 1 tablet twice daily     metoprolol tartrate 25 MG tablet  Commonly known as:  LOPRESSOR  TAKE 1 TABLET BY MOUTH TWICE DAILY     tamsulosin 0.4 MG Caps capsule  Commonly known as:  FLOMAX  Take 1 capsule (0.4 mg total) by mouth daily.        Allergies:  Allergies  Allergen Reactions  . Crestor [Rosuvastatin]     Nightmares in high dosage  . Zocor [Simvastatin]     Nightmares in high dosage    Past Medical History  Diagnosis Date  . Hyperlipidemia     Crestor 3 x wk and Zetia daily  . Hx of cardiac cath   . Coronary atherosclerosis of native coronary artery     2009 LAD CIRC DES  . Chronic kidney disease     stage 111  . PONV (postoperative nausea and vomiting)     pt states extremely sick  . Hypertension     takes Hyzaar daily  . Shortness of breath     with exertion  . Headache(784.0)   . Dizziness   . Joint pain   . History of gout     doesn't require meds  . Cancer     nose/Dr. Albertini;skin  . H/O hiatal hernia   . GERD (gastroesophageal reflux disease)     TUMS prn  . Chronic constipation   . Enlarged prostate     but doesn't require meds at present  . Diabetes mellitus     takes Januvia daily  . Anxiety   . Depression     doesn't take any meds for this  . Insomnia     doesn't require meds at present time  . Onychomycosis 10/05/2012    x 10  .  Bunion 09/29/2011    Past Surgical History  Procedure Laterality Date  . Cholecystectomy    . Eye surgery      bilateral cataracts  . Hemorrhoid surgery    . Rotator cuff surgery      right   . Hernia repair      x  2;inguinal  . Cyst removed from finger      right hand  . Cataracts removed    . Skin cancer removed    . Cardiac catheterization  2013  . Coronary angioplasty      2 stents from 2009  . Colonoscopy    . Coronary artery bypass graft  10/29/2011    Procedure: CORONARY ARTERY BYPASS GRAFTING (CABG);  Surgeon: Kerin Perna, MD;  Location: [facility] OR;  Service: Open Heart Surgery;  Laterality: N/A;  . Left heart catheterization with coronary angiogram N/A 10/21/2011    Procedure: LEFT HEART CATHETERIZATION WITH CORONARY ANGIOGRAM;  Surgeon: Donato Schultz, MD;  Location: [facility] CATH LAB;  Service: Cardiovascular;  Laterality: N/A;  . Kidney stone surgery    . Bladder cancer      Family History  Problem Relation Age of Onset  . Stroke Father   . Cancer Brother     Social History:  reports that he has quit smoking. He has never used smokeless tobacco. He reports that he does not drink alcohol or use illicit drugs.  Review of Systems:  HYPERTENSION:  fairly well-controlled on losartan HCT   HYPERLIPIDEMIA: The lipid abnormality consists of elevated LDL and low HDL treated with Lipitor   He did not want to continue Crestor and Zetia because of cost and is only taking Lipitor He says that the 80 mg dose prescribed by cardiologist causes nightmares  Currently taking only 40 mg 2 or 3 times a week, muscle aches are better with using CoQ10   Lab Results  Component Value Date   CHOL 126 11/09/2013   HDL 28.70* 11/09/2013   LDLCALC 75 11/09/2013   TRIG 113.0 11/09/2013   CHOLHDL 4 11/09/2013        Examination:   BP 136/84 mmHg  Pulse 66  Temp(Src) 98 F (36.7 C)  Resp 14  Ht 5\' 6"  (1.676 m)  Wt 200 lb (90.719 kg)  BMI 32.30 kg/m2  SpO2 94%  Body mass index is 32.3 kg/(m^2).   ASSESSMENT/ PLAN:   Diabetes type 2   The patient's diabetes control appears to be significantly better with combination of metformin and Amaryl and he is taking 1000 mg metformin with 1 mg  He has been more  motivated to watch his diet but still has occasional postprandial hyperglycemia when he is not consistent; he occasionally likes to eat sweets like donuts He is generally active during summer but has gained a couple of pounds despite this A1c is excellent at 6.3 without hypoglycemia  Since his kidney function is reasonably good and he is benefiting from metformin he will continue 1000 mg  HYPERLIPIDEMIA: He is not able to tolerate more than small doses of Lipitor, discussed needing to be consistent with diet He can probably try Crestor again when it goes generic  Patient Instructions  Reduce dairy fat, saturated and trans fats in diet (donuts etc)  Walk daily    Ciria Bernardini 08/10/2014, 1:47 PM

## 2014-08-16 ENCOUNTER — Ambulatory Visit (INDEPENDENT_AMBULATORY_CARE_PROVIDER_SITE_OTHER): Payer: Medicare Other | Admitting: Cardiology

## 2014-08-16 ENCOUNTER — Encounter: Payer: Self-pay | Admitting: Cardiology

## 2014-08-16 VITALS — BP 170/80 | HR 69 | Ht 66.5 in | Wt 197.4 lb

## 2014-08-16 DIAGNOSIS — I2510 Atherosclerotic heart disease of native coronary artery without angina pectoris: Secondary | ICD-10-CM

## 2014-08-16 DIAGNOSIS — I10 Essential (primary) hypertension: Secondary | ICD-10-CM

## 2014-08-16 DIAGNOSIS — E785 Hyperlipidemia, unspecified: Secondary | ICD-10-CM

## 2014-08-16 NOTE — Patient Instructions (Signed)
Medication Instructions:  Your physician recommends that you continue on your current medications as directed. Please refer to the Current Medication list given to you today.  Follow-Up: Follow up in 6 months with Dr. Skains.  You will receive a letter in the mail 2 months before you are due.  Please call us when you receive this letter to schedule your follow up appointment.  Thank you for choosing [facility]!!     

## 2014-08-16 NOTE — Progress Notes (Signed)
.     [address], [address] [place], [place]  [address] Phone: [phone] Fax:  [phone]  Date:  08/16/2014   ID:  Cody Thomas, DOB Oct 19, 1930, MRN [id number]  PCP:  Gaye Alken, MD   History of Present Illness: Cody Thomas is a 81 y.o. male with coronary disease status post 4 vessel bypass surgery 10/29/11 by Dr. Maren Beach here for followup. He a brief postoperative atrial fibrillation. He has had some irritation in his chest wall post bypass. Otherwise is feeling quite well. He does yard work without difficulty.   Had to stop Zetia and Crestor becuase of cost. On atorvastatin periodically.  Overall no anginal like symptoms.  Ate ham biscuit and his blood pressure is elevated this morning.  Took lab work at Dr. Zachery Dauer office recently. I have not seen results as of yet. He believes that his cholesterol has been excellent.  Unfortunately found bladder cancer after bout of kidney stone.    Wt Readings from Last 3 Encounters:  08/16/14 197 lb 6.4 oz (89.54 kg)  08/09/14 200 lb (90.719 kg)  04/26/14 198 lb (89.812 kg)     Past Medical History  Diagnosis Date  . Hyperlipidemia     Crestor 3 x wk and Zetia daily  . Hx of cardiac cath   . Coronary atherosclerosis of native coronary artery     2009 LAD CIRC DES  . Chronic kidney disease     stage 111  . PONV (postoperative nausea and vomiting)     pt states extremely sick  . Hypertension     takes Hyzaar daily  . Shortness of breath     with exertion  . Headache(784.0)   . Dizziness   . Joint pain   . History of gout     doesn't require meds  . Cancer     nose/Dr. Albertini;skin  . H/O hiatal hernia   . GERD (gastroesophageal reflux disease)     TUMS prn  . Chronic constipation   . Enlarged prostate     but doesn't require meds at present  . Diabetes mellitus     takes Januvia daily  . Anxiety   . Depression     doesn't take any meds for this  . Insomnia     doesn't  require meds at present time  . Onychomycosis 10/05/2012    x 10  . Bunion 09/29/2011    Past Surgical History  Procedure Laterality Date  . Cholecystectomy    . Eye surgery      bilateral cataracts  . Hemorrhoid surgery    . Rotator cuff surgery      right   . Hernia repair      x 2;inguinal  . Cyst removed from finger      right hand  . Cataracts removed    . Skin cancer removed    . Cardiac catheterization  2013  . Coronary angioplasty      2 stents from 2009  . Colonoscopy    . Coronary artery bypass graft  10/29/2011    Procedure: CORONARY ARTERY BYPASS GRAFTING (CABG);  Surgeon: Kerin Perna, MD;  Location: [facility] OR;  Service: Open Heart Surgery;  Laterality: N/A;  . Left heart catheterization with coronary angiogram N/A 10/21/2011    Procedure: LEFT HEART CATHETERIZATION WITH CORONARY ANGIOGRAM;  Surgeon: Donato Schultz, MD;  Location: [facility] CATH LAB;  Service: Cardiovascular;  Laterality: N/A;  . Kidney stone surgery    .  Bladder cancer      Current Outpatient Prescriptions  Medication Sig Dispense Refill  . aspirin 325 MG tablet Take 325 mg by mouth daily.    Marland Kitchen atorvastatin (LIPITOR) 80 MG tablet Take 80 mg by mouth daily. Patient says he takes it 2 times a week    . calcium carbonate (TUMS - DOSED IN MG ELEMENTAL CALCIUM) 500 MG chewable tablet Chew 2 tablets by mouth daily as needed. For indigestion.     . Coenzyme Q10-Vitamin E 100-1 MG-UNT/5ML SYRP Take 100 mg by mouth daily. Take 2 tablespoons (100MG ) Daily    . fish oil-omega-3 fatty acids 1000 MG capsule Take 1,200 mg by mouth daily. 4 1200 tablets daily    . fluticasone (FLONASE) 50 MCG/ACT nasal spray Place 1 spray into both nostrils daily as needed for allergies or rhinitis.    Marland Kitchen glimepiride (AMARYL) 2 MG tablet Take 1 tablet (2 mg total) by mouth daily. 90 tablet 2  . losartan-hydrochlorothiazide (HYZAAR) 100-25 MG per tablet Take 1 tablet by mouth daily. 90 tablet 2  . metFORMIN (GLUCOPHAGE-XR) 500 MG 24 hr  tablet Take 1 tablet twice daily 60 tablet 3  . metoprolol tartrate (LOPRESSOR) 25 MG tablet TAKE 1 TABLET BY MOUTH TWICE DAILY 180 tablet 1  . tamsulosin (FLOMAX) 0.4 MG CAPS capsule Take 1 capsule (0.4 mg total) by mouth daily. 14 capsule 0   No current facility-administered medications for this visit.    Allergies:    Allergies  Allergen Reactions  . Crestor [Rosuvastatin]     Nightmares in high dosage  . Zocor [Simvastatin]     Nightmares in high dosage    Social History:  The patient  reports that he has quit smoking. He has never used smokeless tobacco. He reports that he does not drink alcohol or use illicit drugs.   ROS:  Please see the history of present illness.   Mild HA at times. Chest soreness. No CVA. Positive waking. Trouble with dyspnea when bending over.  PHYSICAL EXAM: VS:  BP 170/80 mmHg  Pulse 69  Ht 5' 6.5" (1.689 m)  Wt 197 lb 6.4 oz (89.54 kg)  BMI 31.39 kg/m2  SpO2 94% Well nourished, well developed, in no acute distress HEENT: normal Neck: no JVD Cardiac:  normal S1, S2; RRR; no murmur Lungs:  clear to auscultation bilaterally, no wheezing, rhonchi or rales Abd: soft, nontender, no hepatomegalyoverweight. Protuberant Ext: no edema Skin: warm and dry Neuro: no focal abnormalities noted  EKG: 08/21/13 - Sinus bradycardia, first degree AV block, PR interval 298 ms otherwise unremarkable.  ASSESSMENT AND PLAN:  1. Hyperlipidemia-too expensive Crestor 10mg . Atorvastatin 80 mg perhaps twice a week.  Co-Q 10 suggested.  He actually has had very good success from LDL down to 75 on high-dose atorvastatin. Dr. Zachery Dauer recently performed lab work. I have not received results as of yet. Tc 129, Trig 169 he remembers. He admits to sometimes missing his dose of atorvastatin. 2. First degree AVB - . Stable. No syncope.  3. Hypertension-elevated today. Usually it is in normal range at home. Continue with current medications.  4. CAD-stable, post bypass. No  current typical anginal symptoms. Still with some irritation left sternal region. 5. Obesity-continue to encourage weight loss. 6. Bladder cancer-Dr. Earlene Plater. Post chemotherapy. Found after kidney stone. 7. Six-month follow-up  Signed, Donato Schultz, MD [facility]  08/16/2014 7:59 AM

## 2014-08-24 ENCOUNTER — Other Ambulatory Visit: Payer: Self-pay | Admitting: Endocrinology

## 2014-08-31 ENCOUNTER — Other Ambulatory Visit: Payer: Self-pay | Admitting: Family Medicine

## 2014-08-31 DIAGNOSIS — R918 Other nonspecific abnormal finding of lung field: Secondary | ICD-10-CM

## 2014-09-06 DIAGNOSIS — H18413 Arcus senilis, bilateral: Secondary | ICD-10-CM | POA: Insufficient documentation

## 2014-09-06 DIAGNOSIS — H02103 Unspecified ectropion of right eye, unspecified eyelid: Secondary | ICD-10-CM | POA: Insufficient documentation

## 2014-09-07 ENCOUNTER — Ambulatory Visit
Admission: RE | Admit: 2014-09-07 | Discharge: 2014-09-07 | Disposition: A | Discharge status: Home / Self Care | Payer: Medicare Other | Source: Ambulatory Visit | Attending: Family Medicine | Admitting: Family Medicine

## 2014-09-07 DIAGNOSIS — R918 Other nonspecific abnormal finding of lung field: Secondary | ICD-10-CM

## 2014-09-21 DIAGNOSIS — N183 Chronic kidney disease, stage 3 unspecified: Secondary | ICD-10-CM | POA: Insufficient documentation

## 2014-09-26 ENCOUNTER — Other Ambulatory Visit: Payer: Self-pay | Admitting: Family Medicine

## 2014-09-26 ENCOUNTER — Telehealth: Payer: Self-pay

## 2014-09-26 DIAGNOSIS — E041 Nontoxic single thyroid nodule: Secondary | ICD-10-CM

## 2014-09-26 NOTE — Telephone Encounter (Signed)
Patient is going to have a bladder biopsy Tuesday August 2nd and his doctor needs to know if he can come off his ASA for procedure.  Patient just found out yesterday on 7/26 that he needs to be off ASA for 10 days prior to biopsy so he stopped taking his ASA yesterday 7/26

## 2014-09-27 ENCOUNTER — Other Ambulatory Visit: Payer: Self-pay | Admitting: Endocrinology

## 2014-10-01 ENCOUNTER — Telehealth: Payer: Self-pay

## 2014-10-01 NOTE — Telephone Encounter (Signed)
Told patient that Dr. Marlou Porch approved of him being off the asa for bladder bx:  OK with stopping ASA pre bladder bx given increased risk of bleeding. Risk of cardiovascular event (post CABG) should be fairly low.

## 2014-10-01 NOTE — Telephone Encounter (Signed)
OK with stopping ASA pre bladder bx given increased risk of bleeding. Risk of cardiovascular event (post CABG) should be fairly low.   Candee Furbish, MD

## 2014-10-03 NOTE — Telephone Encounter (Signed)
No note needed 

## 2014-10-05 ENCOUNTER — Other Ambulatory Visit: Payer: Medicare Other

## 2014-10-08 ENCOUNTER — Ambulatory Visit
Admission: RE | Admit: 2014-10-08 | Discharge: 2014-10-08 | Disposition: A | Discharge status: Home / Self Care | Payer: Medicare Other | Source: Ambulatory Visit | Attending: Family Medicine | Admitting: Family Medicine

## 2014-10-08 DIAGNOSIS — E041 Nontoxic single thyroid nodule: Secondary | ICD-10-CM

## 2014-10-17 ENCOUNTER — Telehealth: Payer: Self-pay | Admitting: Cardiology

## 2014-10-17 NOTE — Telephone Encounter (Signed)
Paperwork being faxed by Gemma Payor in [place] now.

## 2014-10-17 NOTE — Telephone Encounter (Signed)
New Message        Calling stating that they are still waiting for a fax from [facility] stating that it is ok to hold Aspirin for pt. Fax: [phone]

## 2014-10-18 ENCOUNTER — Other Ambulatory Visit: Payer: Self-pay | Admitting: Family Medicine

## 2014-10-18 DIAGNOSIS — E041 Nontoxic single thyroid nodule: Secondary | ICD-10-CM

## 2014-10-23 ENCOUNTER — Other Ambulatory Visit: Payer: Self-pay | Admitting: Cardiology

## 2014-10-30 ENCOUNTER — Ambulatory Visit (INDEPENDENT_AMBULATORY_CARE_PROVIDER_SITE_OTHER): Payer: Medicare Other | Admitting: Pulmonary Disease

## 2014-10-30 ENCOUNTER — Encounter: Payer: Self-pay | Admitting: Pulmonary Disease

## 2014-10-30 VITALS — BP 138/74 | HR 72 | Ht 66.5 in | Wt 196.0 lb

## 2014-10-30 DIAGNOSIS — R918 Other nonspecific abnormal finding of lung field: Secondary | ICD-10-CM | POA: Diagnosis not present

## 2014-10-30 NOTE — Assessment & Plan Note (Signed)
He has multiple scattered pulmonary nodules which are small, round, and solid. None of these are larger than 5 mm in size. Because of this, they do not have worrisome features of malignancy but considering his age and his known diagnosis of bladder cancer we need to keep an eye on these.  He has no red flag symptoms to suggest cancer. The only way to obtain a diagnosis at this point would be to perform a biopsy, and the benefits of that do not outweigh the risks.  Plan: Serial imaging, we will repeat a CT chest 6 months after the initial study and see him after that We will plan on following these for 2 years He was educated on red flag symptoms for malignancy and it is to call us should he develop any.

## 2014-10-30 NOTE — Patient Instructions (Signed)
We will arrange another CT scan in 6 months and see you after that Let us know if you have chest pain, shortness of breath, weight loss, or coughing up blood.

## 2014-10-30 NOTE — Progress Notes (Signed)
Subjective:    Patient ID: Cody Thomas, male    DOB: 1931/01/14, 81 y.o.   MRN: [id number]  HPI Chief Complaint  Patient presents with  . Advice Only    referred for new lung nodules.  pt notes sob with strenuous exertion and PND, no other complaints.      Cody Thomas has been referred to our clinic for evaluation of multiple pulmonary nodules. He says that he used to smoke 1-2 packs of cigarettes daily for approximately 30 years. He quit 30 years ago. He says that he's never been told he has a lung disease until recently when he was found to have multiple pulmonary nodules. He says that he developed kidney stones earlier this year and had a CT scan of his abdomen which confirm that diagnosis. However, it also showed some small pulmonary nodules in the bases of his lungs. He went for stone extraction and was found to have bladder cancer when a mass in his bladder was biopsied. This has been treated with BCG. He has undergone several treatments. He says that he had an abnormal urine cytology a few weeks ago, but apparently his follow-up study was normal. His urology team feels that his treatment has been going well.  During this time he has not had any shortness of breath. He had a CT scan of his chest performed in July to follow-up the pulmonary nodules. See the description below. He denies chest pain, weight loss, hemoptysis, cough, or mucus production. He says from time to time he will have some cough when he has increased sinus congestion. This will typically be productive of foamy mucus. He also says that he has some shortness of breath with strenuous exercise but this has been present for years and is not activity limiting.  Past Medical History  Diagnosis Date  . Hyperlipidemia     Crestor 3 x wk and Zetia daily  . Hx of cardiac cath   . Coronary atherosclerosis of native coronary artery     2009 LAD CIRC DES  . Chronic kidney disease     stage 111  . PONV (postoperative nausea  and vomiting)     pt states extremely sick  . Hypertension     takes Hyzaar daily  . Shortness of breath     with exertion  . Headache(784.0)   . Dizziness   . Joint pain   . History of gout     doesn't require meds  . Cancer     nose/Dr. Albertini;skin  . H/O hiatal hernia   . GERD (gastroesophageal reflux disease)     TUMS prn  . Chronic constipation   . Enlarged prostate     but doesn't require meds at present  . Diabetes mellitus     takes Januvia daily  . Anxiety   . Depression     doesn't take any meds for this  . Insomnia     doesn't require meds at present time  . Onychomycosis 10/05/2012    x 10  . Bunion 09/29/2011     Family History  Problem Relation Age of Onset  . Stroke Father   . Cancer Brother      Social History   Social History  . Marital Status: Married    Spouse Name: N/A  . Number of Children: N/A  . Years of Education: N/A   Occupational History  . Not on file.   Social History Main Topics  . Smoking status: Former  Smoker -- 1.50 packs/day for 30 years    Types: Cigarettes    Quit date: 03/02/1984  . Smokeless tobacco: Never Used  . Alcohol Use: No  . Drug Use: No  . Sexual Activity: Not Currently   Other Topics Concern  . Not on file   Social History Narrative     Allergies  Allergen Reactions  . Crestor [Rosuvastatin]     Nightmares in high dosage  . Zocor [Simvastatin]     Nightmares in high dosage     Outpatient Prescriptions Prior to Visit  Medication Sig Dispense Refill  . aspirin 325 MG tablet Take 325 mg by mouth daily.    Marland Kitchen atorvastatin (LIPITOR) 80 MG tablet Take 80 mg by mouth daily. Patient says he takes it 2 times a week    . calcium carbonate (TUMS - DOSED IN MG ELEMENTAL CALCIUM) 500 MG chewable tablet Chew 2 tablets by mouth daily as needed. For indigestion.     . Coenzyme Q10-Vitamin E 100-1 MG-UNT/5ML SYRP Take 100 mg by mouth daily. Take 2 tablespoons (100MG ) Daily    . fish oil-omega-3 fatty acids  1000 MG capsule Take 1,200 mg by mouth daily. 4 1200 tablets daily    . fluticasone (FLONASE) 50 MCG/ACT nasal spray Place 1 spray into both nostrils daily as needed for allergies or rhinitis.    Marland Kitchen glimepiride (AMARYL) 2 MG tablet Take 1 tablet (2 mg total) by mouth daily. 90 tablet 2  . losartan-hydrochlorothiazide (HYZAAR) 100-25 MG per tablet Take 1 tablet by mouth daily. 90 tablet 2  . metFORMIN (GLUCOPHAGE-XR) 500 MG 24 hr tablet TAKE 1 TABLET BY MOUTH TWICE DAILY 180 tablet 3  . metoprolol tartrate (LOPRESSOR) 25 MG tablet TAKE 1 TABLET BY MOUTH TWICE DAILY 180 tablet 0  . tamsulosin (FLOMAX) 0.4 MG CAPS capsule Take 1 capsule (0.4 mg total) by mouth daily. 14 capsule 0  . metFORMIN (GLUCOPHAGE-XR) 500 MG 24 hr tablet TAKE 1 TABLET BY MOUTH TWICE DAILY (Patient not taking: Reported on 10/30/2014) 180 tablet 3   No facility-administered medications prior to visit.       Review of Systems  Constitutional: Negative for fever and unexpected weight change.  HENT: Positive for postnasal drip. Negative for congestion, dental problem, ear pain, nosebleeds, rhinorrhea, sinus pressure, sneezing, sore throat and trouble swallowing.   Eyes: Negative for redness and itching.  Respiratory: Positive for cough. Negative for chest tightness, shortness of breath and wheezing.   Cardiovascular: Negative for palpitations and leg swelling.  Gastrointestinal: Negative for nausea and vomiting.  Genitourinary: Negative for dysuria.  Musculoskeletal: Negative for joint swelling.  Skin: Negative for rash.  Neurological: Negative for headaches.  Hematological: Does not bruise/bleed easily.  Psychiatric/Behavioral: Negative for dysphoric mood. The patient is not nervous/anxious.        Objective:   Physical Exam  Filed Vitals:   10/30/14 1529  BP: 138/74  Pulse: 72  Height: 5' 6.5" (1.689 m)  Weight: 196 lb (88.905 kg)  SpO2: 94%   RA  Gen: elderly male, well appearing, no acute distress HENT:  NCAT, OP clear, neck supple without masses Eyes: PERRL, EOMi Lymph: no cervical lymphadenopathy PULM: Crackles R base, faint, clear with multiple deep breaths CV: RRR, no mgr, no JVD GI: BS+, soft, nontender, no hsm Derm: no rash or skin breakdown MSK: normal bulk and tone Neuro: A&Ox4, CN II-XII intact, strength 5/5 in all 4 extremities Psyche: normal mood and affect  July 2016 CT chest images personally  reviewed, mild centrilobular emphysema in the upper lobes bilaterally, there are scattered small, round, solid pulmonary nodules in both lungs, most prominent in the bases, none of these are larger than 5 mm.     Assessment & Plan:  Pulmonary nodules/lesions, multiple He has multiple scattered pulmonary nodules which are small, round, and solid. None of these are larger than 5 mm in size. Because of this, they do not have worrisome features of malignancy but considering his age and his known diagnosis of bladder cancer we need to keep an eye on these.  He has no red flag symptoms to suggest cancer. The only way to obtain a diagnosis at this point would be to perform a biopsy, and the benefits of that do not outweigh the risks.  Plan: Serial imaging, we will repeat a CT chest 6 months after the initial study and see him after that We will plan on following these for 2 years He was educated on red flag symptoms for malignancy and it is to call us should he develop any.     Current outpatient prescriptions:  .  aspirin 325 MG tablet, Take 325 mg by mouth daily., Disp: , Rfl:  .  atorvastatin (LIPITOR) 80 MG tablet, Take 80 mg by mouth daily. Patient says he takes it 2 times a week, Disp: , Rfl:  .  calcium carbonate (TUMS - DOSED IN MG ELEMENTAL CALCIUM) 500 MG chewable tablet, Chew 2 tablets by mouth daily as needed. For indigestion. , Disp: , Rfl:  .  Coenzyme Q10-Vitamin E 100-1 MG-UNT/5ML SYRP, Take 100 mg by mouth daily. Take 2 tablespoons (100MG ) Daily, Disp: , Rfl:  .  fish  oil-omega-3 fatty acids 1000 MG capsule, Take 1,200 mg by mouth daily. 4 1200 tablets daily, Disp: , Rfl:  .  fluticasone (FLONASE) 50 MCG/ACT nasal spray, Place 1 spray into both nostrils daily as needed for allergies or rhinitis., Disp: , Rfl:  .  glimepiride (AMARYL) 2 MG tablet, Take 1 tablet (2 mg total) by mouth daily., Disp: 90 tablet, Rfl: 2 .  losartan-hydrochlorothiazide (HYZAAR) 100-25 MG per tablet, Take 1 tablet by mouth daily., Disp: 90 tablet, Rfl: 2 .  metFORMIN (GLUCOPHAGE-XR) 500 MG 24 hr tablet, TAKE 1 TABLET BY MOUTH TWICE DAILY, Disp: 180 tablet, Rfl: 3 .  metoprolol tartrate (LOPRESSOR) 25 MG tablet, TAKE 1 TABLET BY MOUTH TWICE DAILY, Disp: 180 tablet, Rfl: 0 .  tamsulosin (FLOMAX) 0.4 MG CAPS capsule, Take 1 capsule (0.4 mg total) by mouth daily., Disp: 14 capsule, Rfl: 0

## 2014-10-31 ENCOUNTER — Ambulatory Visit (INDEPENDENT_AMBULATORY_CARE_PROVIDER_SITE_OTHER): Payer: Medicare Other | Admitting: Podiatry

## 2014-10-31 DIAGNOSIS — B351 Tinea unguium: Secondary | ICD-10-CM

## 2014-10-31 DIAGNOSIS — M79676 Pain in unspecified toe(s): Secondary | ICD-10-CM | POA: Diagnosis not present

## 2014-10-31 NOTE — Progress Notes (Signed)
Patient ID: Cody Thomas, male   DOB: 08-Jun-1930, 81 y.o.   MRN: [id number]  Subjective: This patient presents for scheduled visit complaining of painful toenails and requests toenail debridement  Objective: Orientated 3 The toenails are brittle, elongated, discolored, hypertrophic, incurvated and tender direct palpation 6-10  Assessment: Symptomatic onychomycoses 6-10 Type II diabetic  Plan: Debridement of toenails 10 mechanically and electrically. Small bleeding distal third and fifth right toes treated with topical antibiotic ointment and Band-Aids. Patient made aware of these bleeding areas that and advised to remove Band-Aids 1-2 days and apply topical antibiotic ointment and Band-Aids until a scab forms  Reappoint 3 months

## 2014-10-31 NOTE — Patient Instructions (Signed)
Removed Band-Aids on toes 3 and 5 right in  1-2 days. Apply topical antibiotic ointment to the bleeding areas on the toes and cover with Band-Aid until a scab forms  Diabetes and Foot Care Diabetes may cause you to have problems because of poor blood supply (circulation) to your feet and legs. This may cause the skin on your feet to become thinner, break easier, and heal more slowly. Your skin may become dry, and the skin may peel and crack. You may also have nerve damage in your legs and feet causing decreased feeling in them. You may not notice minor injuries to your feet that could lead to infections or more serious problems. Taking care of your feet is one of the most important things you can do for yourself.  HOME CARE INSTRUCTIONS  Wear shoes at all times, even in the house. Do not go barefoot. Bare feet are easily injured.  Check your feet daily for blisters, cuts, and redness. If you cannot see the bottom of your feet, use a mirror or ask someone for help.  Wash your feet with warm water (do not use hot water) and mild soap. Then pat your feet and the areas between your toes until they are completely dry. Do not soak your feet as this can dry your skin.  Apply a moisturizing lotion or petroleum jelly (that does not contain alcohol and is unscented) to the skin on your feet and to dry, brittle toenails. Do not apply lotion between your toes.  Trim your toenails straight across. Do not dig under them or around the cuticle. File the edges of your nails with an emery board or nail file.  Do not cut corns or calluses or try to remove them with medicine.  Wear clean socks or stockings every day. Make sure they are not too tight. Do not wear knee-high stockings since they may decrease blood flow to your legs.  Wear shoes that fit properly and have enough cushioning. To break in new shoes, wear them for just a few hours a day. This prevents you from injuring your feet. Always look in your shoes  before you put them on to be sure there are no objects inside.  Do not cross your legs. This may decrease the blood flow to your feet.  If you find a minor scrape, cut, or break in the skin on your feet, keep it and the skin around it clean and dry. These areas may be cleansed with mild soap and water. Do not cleanse the area with peroxide, alcohol, or iodine.  When you remove an adhesive bandage, be sure not to damage the skin around it.  If you have a wound, look at it several times a day to make sure it is healing.  Do not use heating pads or hot water bottles. They may burn your skin. If you have lost feeling in your feet or legs, you may not know it is happening until it is too late.  Make sure your health care provider performs a complete foot exam at least annually or more often if you have foot problems. Report any cuts, sores, or bruises to your health care provider immediately. SEEK MEDICAL CARE IF:   You have an injury that is not healing.  You have cuts or breaks in the skin.  You have an ingrown nail.  You notice redness on your legs or feet.  You feel burning or tingling in your legs or feet.  You have  pain or cramps in your legs and feet.  Your legs or feet are numb.  Your feet always feel cold. SEEK IMMEDIATE MEDICAL CARE IF:   There is increasing redness, swelling, or pain in or around a wound.  There is a red line that goes up your leg.  Pus is coming from a wound.  You develop a fever or as directed by your health care provider.  You notice a bad smell coming from an ulcer or wound. Document Released: 02/14/2000 Document Revised: 10/19/2012 Document Reviewed: 07/26/2012 [facility] Patient Information 2015 [place], [place]. This information is not intended to replace advice given to you by your health care provider. Make sure you discuss any questions you have with your health care provider.

## 2014-11-08 ENCOUNTER — Other Ambulatory Visit: Payer: Medicare Other

## 2014-11-27 ENCOUNTER — Other Ambulatory Visit: Payer: Medicare Other

## 2014-12-07 ENCOUNTER — Other Ambulatory Visit (INDEPENDENT_AMBULATORY_CARE_PROVIDER_SITE_OTHER): Payer: Medicare Other

## 2014-12-07 DIAGNOSIS — E119 Type 2 diabetes mellitus without complications: Secondary | ICD-10-CM | POA: Diagnosis not present

## 2014-12-07 DIAGNOSIS — E785 Hyperlipidemia, unspecified: Secondary | ICD-10-CM

## 2014-12-07 LAB — BASIC METABOLIC PANEL
BUN: 25 mg/dL — ABNORMAL HIGH (ref 6–23)
CO2: 31 meq/L (ref 19–32)
Calcium: 9.9 mg/dL (ref 8.4–10.5)
Chloride: 98 mEq/L (ref 96–112)
Creatinine, Ser: 1.55 mg/dL — ABNORMAL HIGH (ref 0.40–1.50)
GFR: 45.83 mL/min — ABNORMAL LOW (ref >60.00)
Glucose, Bld: 183 mg/dL — ABNORMAL HIGH (ref 70–99)
Potassium: 4.1 mEq/L (ref 3.5–5.1)
Sodium: 137 mEq/L (ref 135–145)

## 2014-12-07 LAB — LIPID PANEL
Cholesterol: 144 mg/dL (ref 0–200)
HDL: 32.5 mg/dL — ABNORMAL LOW (ref >39.00)
LDL Cholesterol: 72 mg/dL (ref 0–99)
NonHDL: 111.41
Total CHOL/HDL Ratio: 4
Triglycerides: 195 mg/dL — ABNORMAL HIGH (ref 0.0–149.0)
VLDL: 39 mg/dL (ref 0.0–40.0)

## 2014-12-07 LAB — HEMOGLOBIN A1C: Hgb A1c MFr Bld: 6.3 % (ref 4.6–6.5)

## 2014-12-12 ENCOUNTER — Encounter: Payer: Self-pay | Admitting: Endocrinology

## 2014-12-12 ENCOUNTER — Ambulatory Visit (INDEPENDENT_AMBULATORY_CARE_PROVIDER_SITE_OTHER): Payer: Medicare Other | Admitting: Endocrinology

## 2014-12-12 VITALS — BP 118/66 | HR 66 | Temp 98.4°F | Resp 16 | Ht 66.5 in | Wt 197.2 lb

## 2014-12-12 DIAGNOSIS — I10 Essential (primary) hypertension: Secondary | ICD-10-CM

## 2014-12-12 DIAGNOSIS — N182 Chronic kidney disease, stage 2 (mild): Secondary | ICD-10-CM | POA: Diagnosis not present

## 2014-12-12 DIAGNOSIS — E785 Hyperlipidemia, unspecified: Secondary | ICD-10-CM

## 2014-12-12 DIAGNOSIS — E119 Type 2 diabetes mellitus without complications: Secondary | ICD-10-CM

## 2014-12-12 NOTE — Progress Notes (Signed)
Patient ID: Cody Thomas, male   DOB: December 26, 1930, 81 y.o.   MRN: [id number]   Reason for Appointment: Diabetes follow-up   History of Present Illness   Diagnosis: Type 2 DIABETES MELITUS  He has had mild diabetes which has been previously erratic partly because of dietary inconsistency and difficulty with weight loss He has not been continued on metformin because of renal dysfunction Was given low-dose Januvia instead but this was too expensive for him even though it was helping his control In 2014 he was given low dose Amaryl instead which appears to be keeping his blood sugars controlled   Recent history: His blood sugars were not well controlled when he was seen in 1/16 with A1c 7.8 He was then  given metformin in addition to his Amaryl and he is  taking 1000 mg daily without any side effects. Also his Amaryl was reduced to 1 mg daily   More recently his blood sugars have been overall fairly good although periodically high after his evening meal when he is going off his diet and eating cookies He is compliant with medications and will take the Amaryl in the morning A1c is now again excellent at 6.3 However he still has difficulty losing weight and is not very active this summer            Monitors blood glucose: Once a day or less .    Glucometer:   Accu-Chek         Blood Glucose readings from meter download:  Fasting readings are averaging 117, overall AVERAGE 134 with 2 high readings over 200 at 8 PM and 1 AM Has 13 readings in the last month    Physical activity: exercise: Outside activities mostly  Diet: Getting into some donuts or cookies at times He is generally eating only toast and jam at breakfast            Wt Readings from Last 3 Encounters:  12/12/14 197 lb 3.2 oz (89.449 kg)  10/30/14 196 lb (88.905 kg)  08/16/14 197 lb 6.4 oz (89.54 kg)   DM LABS:  Lab Results  Component Value Date   HGBA1C 6.3 12/07/2014   HGBA1C 6.3 08/06/2014   HGBA1C  7.8* 03/13/2014   Lab Results  Component Value Date   MICROALBUR 0.7 03/13/2014   LDLCALC 72 12/07/2014   CREATININE 1.55* 12/07/2014   Appointment on 12/07/2014  Component Date Value Ref Range Status  . Hgb A1c MFr Bld 12/07/2014 6.3  4.6 - 6.5 % Final   Glycemic Control Guidelines for People with Diabetes:Non Diabetic:  <6%Goal of Therapy: <7%Additional Action Suggested:  >8%   . Sodium 12/07/2014 137  135 - 145 mEq/L Final  . Potassium 12/07/2014 4.1  3.5 - 5.1 mEq/L Final  . Chloride 12/07/2014 98  96 - 112 mEq/L Final  . CO2 12/07/2014 31  19 - 32 mEq/L Final  . Glucose, Bld 12/07/2014 183* 70 - 99 mg/dL Final  . BUN 65/78/4696 25* 6 - 23 mg/dL Final  . Creatinine, Ser 12/07/2014 1.55* 0.40 - 1.50 mg/dL Final  . Calcium 29/52/8413 9.9  8.4 - 10.5 mg/dL Final  . GFR 24/40/1027 45.83* >60.00 mL/min Final  . Cholesterol 12/07/2014 144  0 - 200 mg/dL Final   ATP III Classification       Desirable:  < 200 mg/dL               Borderline High:  200 - 239 mg/dL  High:  > = 240 mg/dL  . Triglycerides 12/07/2014 195.0* 0.0 - 149.0 mg/dL Final   Normal:  <440 mg/dLBorderline High:  150 - 199 mg/dL  . HDL 12/07/2014 32.50* >39.00 mg/dL Final  . VLDL 34/74/2595 39.0  0.0 - 40.0 mg/dL Final  . LDL Cholesterol 12/07/2014 72  0 - 99 mg/dL Final  . Total CHOL/HDL Ratio 12/07/2014 4   Final                  Men          Women1/2 Average Risk     3.4          3.3Average Risk          5.0          4.42X Average Risk          9.6          7.13X Average Risk          15.0          11.0                      . NonHDL 12/07/2014 111.41   Final   NOTE:  Non-HDL goal should be 30 mg/dL higher than patient's LDL goal (i.e. LDL goal of < 70 mg/dL, would have non-HDL goal of < 100 mg/dL)       Medication List       This list is accurate as of: 12/12/14 12:10 PM.  Always use your most recent med list.               aspirin 325 MG tablet  Take 325 mg by mouth daily.     atorvastatin 80  MG tablet  Commonly known as:  LIPITOR  Take 80 mg by mouth daily. Patient says he takes it 2 times a week     calcium carbonate 500 MG chewable tablet  Commonly known as:  TUMS - dosed in mg elemental calcium  Chew 2 tablets by mouth daily as needed. For indigestion.     Coenzyme Q10-Vitamin E 100-1 MG-UNT/5ML Syrp  Take 100 mg by mouth daily. Take 2 tablespoons (100MG ) Daily     fish oil-omega-3 fatty acids 1000 MG capsule  Take 1,200 mg by mouth daily. 4 1200 tablets daily     fluticasone 50 MCG/ACT nasal spray  Commonly known as:  FLONASE  Place 1 spray into both nostrils daily as needed for allergies or rhinitis.     glimepiride 2 MG tablet  Commonly known as:  AMARYL  Take 1 tablet (2 mg total) by mouth daily.     LORazepam 0.5 MG tablet  Commonly known as:  ATIVAN  Take 0.5 mg by mouth as needed for anxiety.     losartan-hydrochlorothiazide 100-25 MG tablet  Commonly known as:  HYZAAR  Take 1 tablet by mouth daily.     metFORMIN 500 MG 24 hr tablet  Commonly known as:  GLUCOPHAGE-XR  TAKE 1 TABLET BY MOUTH TWICE DAILY     metoprolol tartrate 25 MG tablet  Commonly known as:  LOPRESSOR  TAKE 1 TABLET BY MOUTH TWICE DAILY     sertraline 50 MG tablet  Commonly known as:  ZOLOFT  Take 50 mg by mouth daily.     tamsulosin 0.4 MG Caps capsule  Commonly known as:  FLOMAX  Take 1 capsule (0.4 mg total) by mouth daily.        Allergies:  Allergies  Allergen Reactions  . Crestor [Rosuvastatin]     Nightmares in high dosage  . Zocor [Simvastatin]     Nightmares in high dosage    Past Medical History  Diagnosis Date  . Hyperlipidemia     Crestor 3 x wk and Zetia daily  . Hx of cardiac cath   . Coronary atherosclerosis of native coronary artery     2009 LAD CIRC DES  . Chronic kidney disease     stage 111  . PONV (postoperative nausea and vomiting)     pt states extremely sick  . Hypertension     takes Hyzaar daily  . Shortness of breath     with  exertion  . Headache(784.0)   . Dizziness   . Joint pain   . History of gout     doesn't require meds  . Cancer (HCC)     nose/Dr. Albertini;skin  . H/O hiatal hernia   . GERD (gastroesophageal reflux disease)     TUMS prn  . Chronic constipation   . Enlarged prostate     but doesn't require meds at present  . Diabetes mellitus     takes Januvia daily  . Anxiety   . Depression     doesn't take any meds for this  . Insomnia     doesn't require meds at present time  . Onychomycosis 10/05/2012    x 10  . Bunion 09/29/2011    Past Surgical History  Procedure Laterality Date  . Cholecystectomy    . Eye surgery      bilateral cataracts  . Hemorrhoid surgery    . Rotator cuff surgery      right   . Hernia repair      x 2;inguinal  . Cyst removed from finger      right hand  . Cataracts removed    . Skin cancer removed    . Cardiac catheterization  2013  . Coronary angioplasty      2 stents from 2009  . Colonoscopy    . Coronary artery bypass graft  10/29/2011    Procedure: CORONARY ARTERY BYPASS GRAFTING (CABG);  Surgeon: Kerin Perna, MD;  Location: [facility] OR;  Service: Open Heart Surgery;  Laterality: N/A;  . Left heart catheterization with coronary angiogram N/A 10/21/2011    Procedure: LEFT HEART CATHETERIZATION WITH CORONARY ANGIOGRAM;  Surgeon: Donato Schultz, MD;  Location: [facility] CATH LAB;  Service: Cardiovascular;  Laterality: N/A;  . Kidney stone surgery    . Bladder cancer      Family History  Problem Relation Age of Onset  . Stroke Father   . Cancer Brother     Social History:  reports that he quit smoking about 30 years ago. His smoking use included Cigarettes. He has a 45 pack-year smoking history. He has never used smokeless tobacco. He reports that he does not drink alcohol or use illicit drugs.  Review of Systems:  HYPERTENSION:  fairly well-controlled on losartan HCT   HYPERLIPIDEMIA: The lipid abnormality consists of elevated LDL and low HDL treated  with Lipitor   He did not want to continue Crestor and Zetia because of cost and is only taking Lipitor  Apparently the 80 mg Lipitor dose prescribed by cardiologist causes nightmares and is taking only 40 mg 2 or 3 times a week, muscle aches are better with using CoQ10  LDL has been consistently over 70  Lab Results  Component Value Date   CHOL 144  12/07/2014   HDL 32.50* 12/07/2014   LDLCALC 72 12/07/2014   TRIG 195.0* 12/07/2014   CHOLHDL 4 12/07/2014        Examination:   BP 118/66 mmHg  Pulse 66  Temp(Src) 98.4 F (36.9 C)  Resp 16  Ht 5' 6.5" (1.689 m)  Wt 197 lb 3.2 oz (89.449 kg)  BMI 31.36 kg/m2  SpO2 94%  Body mass index is 31.36 kg/(m^2).  Marland Kitchen Repeat blood pressure 120/68 standing  No edema  ASSESSMENT/ PLAN:   Diabetes type 2   The patient's diabetes control appears to be fairly good with A1c 6.3 He is still not able to lose weight because of inconsistent diet and exercise regimen However he will have periodic postprandial readings over 200 when he is eating sweets Also can do better with regular walking program or other exercise Discussed meal planning and balanced meals, he is not getting any protein with his breakfast usually  Since his kidney function is reasonably good and he is benefiting from metformin he will continue 1000 mg daily  RENAL dysfunction: Etiology unclear, likely to be having some nephrosclerosis. May consider reducing the dose of losartan, will deferred to PCP and cardiologist Advised him to increase fluid intake  HYPERLIPIDEMIA: He is not able to tolerate more than small doses of Lipitor LDL is over 70 consistently He can probably try Crestor since he had tolerated this well previously and is now available in generic Discussed lipid targets and avoiding high saturated fat foods and cookies  Recommended annual flu vaccine and he will go to his PCP office for this  Patient Instructions  Walk daily  Egg or low fat cheese at  bfst  Check blood sugars on waking up ..2 .. times a week Also check blood sugars about 2 hours after a meal and do this after different meals by rotation  Recommended blood sugar levels on waking up is 90-130 and about 2 hours after meal is 140-180 Please bring blood sugar monitor to each visit.  More fluids   Counseling time on subjects discussed above is over 50% of today's 25 minute visit  Shadow Schedler 12/12/2014, 12:10 PM

## 2014-12-12 NOTE — Patient Instructions (Signed)
Walk daily  Egg or low fat cheese at bfst  Check blood sugars on waking up ..2 .. times a week Also check blood sugars about 2 hours after a meal and do this after different meals by rotation  Recommended blood sugar levels on waking up is 90-130 and about 2 hours after meal is 140-180 Please bring blood sugar monitor to each visit.  More fluids

## 2014-12-18 ENCOUNTER — Ambulatory Visit
Admission: RE | Admit: 2014-12-18 | Discharge: 2014-12-18 | Disposition: A | Discharge status: Home / Self Care | Payer: Medicare Other | Source: Ambulatory Visit | Attending: Family Medicine | Admitting: Family Medicine

## 2014-12-18 ENCOUNTER — Other Ambulatory Visit (HOSPITAL_COMMUNITY)
Admission: RE | Admit: 2014-12-18 | Discharge: 2014-12-18 | Disposition: A | Discharge status: Home / Self Care | Payer: Medicare Other | Source: Ambulatory Visit | Attending: Family Medicine | Admitting: Family Medicine

## 2014-12-18 DIAGNOSIS — E041 Nontoxic single thyroid nodule: Secondary | ICD-10-CM | POA: Insufficient documentation

## 2014-12-28 ENCOUNTER — Telehealth: Payer: Self-pay | Admitting: Endocrinology

## 2014-12-28 NOTE — Telephone Encounter (Signed)
The Affirma  result is suspicious.  Discussed that with his age and multiple medical problems we would recommend observation only at this time and no surgery This report will be faxed on Monday and this will be reviewed Patient needs to come into the office to discuss results and further plans

## 2014-12-31 NOTE — Telephone Encounter (Signed)
Attempted to reach the pt. Will try again at a later time.  

## 2015-01-01 ENCOUNTER — Encounter (HOSPITAL_COMMUNITY): Payer: Self-pay

## 2015-01-01 NOTE — Telephone Encounter (Signed)
I contacted the pt and advised of note below. Pt voiced understanding and has been scheduled for 01/21/2015 at 345 pm.

## 2015-01-21 ENCOUNTER — Encounter: Payer: Self-pay | Admitting: Endocrinology

## 2015-01-21 ENCOUNTER — Ambulatory Visit (INDEPENDENT_AMBULATORY_CARE_PROVIDER_SITE_OTHER): Payer: Medicare Other | Admitting: Endocrinology

## 2015-01-21 VITALS — BP 130/84 | HR 60 | Temp 98.5°F | Wt 195.4 lb

## 2015-01-21 DIAGNOSIS — E041 Nontoxic single thyroid nodule: Secondary | ICD-10-CM | POA: Diagnosis not present

## 2015-01-21 NOTE — Progress Notes (Signed)
Subjective:     Patient ID: Cody Thomas, male   DOB: [birth year], 81 y.o.   MRN: [id number]  CHIEF complaint: Evaluation of thyroid nodule biopsy results  HPI  He was having a CT scan of his chest in 8/16 and was noted to have a 1.8 cm left thyroid nodule. Subsequently had ultrasound which showed the following: Heterogeneous appearance of the left thyroid. Dominant nodule of the left measures 1.9 cm x 1.8 cm x 2.3 cm. Hypoechoic margin with no internal reflectors.  More superiorly there is a nodule measuring less than 1 cm.  His needle aspiration biopsy showed atypia and the Affirma diagnosis came back as suspicious He has been referred by his PCP for further evaluation here  Currently does not have any difficulty swallowing, choking sensation or pressure in his neck.  Review of Systems  No recent problems with weight loss     Objective:   Physical Exam  Thyroid is not palpable, left-sided nodule is not palpable on exam No lymphadenopathy present in the neck Biceps reflexes are normal     Assessment:     Multinodular goiter with left-sided dominant 2.3 cm nodule His needle aspiration  biopsy shows atypia on cytology with suspicious gene expression classifier   This does not completely determine whether he has a malignant lesion or not  Given the relatively small size of the nodule, lack of suspicious features on ultrasound, his age, multiple medical problems and lack of symptoms  or lymphadenopathy both on exam and ultrasound indicated that watchful waiting may be reasonable to do Patient is also very reluctant to go for surgery which would be at least partial thyroidectomy     Plan:     He will have another ultrasound done in about 3 months to check on the size of the nodule and any lymphadenopathy. Patient understood the discussion and has no further questions and is agreeable to the plan

## 2015-01-23 ENCOUNTER — Other Ambulatory Visit: Payer: Self-pay | Admitting: Cardiology

## 2015-01-30 ENCOUNTER — Encounter: Payer: Self-pay | Admitting: Podiatry

## 2015-01-30 ENCOUNTER — Ambulatory Visit (INDEPENDENT_AMBULATORY_CARE_PROVIDER_SITE_OTHER): Payer: Medicare Other | Admitting: Podiatry

## 2015-01-30 DIAGNOSIS — M79676 Pain in unspecified toe(s): Secondary | ICD-10-CM

## 2015-01-30 DIAGNOSIS — B351 Tinea unguium: Secondary | ICD-10-CM

## 2015-01-30 NOTE — Progress Notes (Signed)
Patient ID: Cody Thomas, male   DOB: 06-25-1930, 81 y.o.   MRN: [id number]  Subjective: This patient presents again for schedule visit complaining of painful toenails walking wearing shoes and request toenail debridement  Objective: Orientated 3 No skin lesions bilaterally The toenails are elongated, brittle, hypertrophic, incurvated, discolored and tender to direct palpation 6-10  Assessment: Symptomatic onychomycoses 6-10 Type II diabetic  Plan: Debrided toenails 10 mechanically and electrically without any bleeding  Reappoint 3 months

## 2015-01-30 NOTE — Patient Instructions (Signed)
Diabetes and Foot Care Diabetes may cause you to have problems because of poor blood supply (circulation) to your feet and legs. This may cause the skin on your feet to become thinner, break easier, and heal more slowly. Your skin may become dry, and the skin may peel and crack. You may also have nerve damage in your legs and feet causing decreased feeling in them. You may not notice minor injuries to your feet that could lead to infections or more serious problems. Taking care of your feet is one of the most important things you can do for yourself.  HOME CARE INSTRUCTIONS  Wear shoes at all times, even in the house. Do not go barefoot. Bare feet are easily injured.  Check your feet daily for blisters, cuts, and redness. If you cannot see the bottom of your feet, use a mirror or ask someone for help.  Wash your feet with warm water (do not use hot water) and mild soap. Then pat your feet and the areas between your toes until they are completely dry. Do not soak your feet as this can dry your skin.  Apply a moisturizing lotion or petroleum jelly (that does not contain alcohol and is unscented) to the skin on your feet and to dry, brittle toenails. Do not apply lotion between your toes.  Trim your toenails straight across. Do not dig under them or around the cuticle. File the edges of your nails with an emery board or nail file.  Do not cut corns or calluses or try to remove them with medicine.  Wear clean socks or stockings every day. Make sure they are not too tight. Do not wear knee-high stockings since they may decrease blood flow to your legs.  Wear shoes that fit properly and have enough cushioning. To break in new shoes, wear them for just a few hours a day. This prevents you from injuring your feet. Always look in your shoes before you put them on to be sure there are no objects inside.  Do not cross your legs. This may decrease the blood flow to your feet.  If you find a minor scrape,  cut, or break in the skin on your feet, keep it and the skin around it clean and dry. These areas may be cleansed with mild soap and water. Do not cleanse the area with peroxide, alcohol, or iodine.  When you remove an adhesive bandage, be sure not to damage the skin around it.  If you have a wound, look at it several times a day to make sure it is healing.  Do not use heating pads or hot water bottles. They may burn your skin. If you have lost feeling in your feet or legs, you may not know it is happening until it is too late.  Make sure your health care provider performs a complete foot exam at least annually or more often if you have foot problems. Report any cuts, sores, or bruises to your health care provider immediately. SEEK MEDICAL CARE IF:   You have an injury that is not healing.  You have cuts or breaks in the skin.  You have an ingrown nail.  You notice redness on your legs or feet.  You feel burning or tingling in your legs or feet.  You have pain or cramps in your legs and feet.  Your legs or feet are numb.  Your feet always feel cold. SEEK IMMEDIATE MEDICAL CARE IF:   There is increasing redness,   swelling, or pain in or around a wound.  There is a red line that goes up your leg.  Pus is coming from a wound.  You develop a fever or as directed by your health care provider.  You notice a bad smell coming from an ulcer or wound.   This information is not intended to replace advice given to you by your health care provider. Make sure you discuss any questions you have with your health care provider.   Document Released: 02/14/2000 Document Revised: 10/19/2012 Document Reviewed: 07/26/2012 Elsevier Interactive Patient Education 2016 [employer]  

## 2015-02-04 ENCOUNTER — Ambulatory Visit (INDEPENDENT_AMBULATORY_CARE_PROVIDER_SITE_OTHER): Payer: Medicare Other | Admitting: Cardiology

## 2015-02-04 ENCOUNTER — Encounter: Payer: Self-pay | Admitting: Cardiology

## 2015-02-04 VITALS — BP 150/72 | HR 63 | Ht 66.0 in | Wt 196.2 lb

## 2015-02-04 DIAGNOSIS — I10 Essential (primary) hypertension: Secondary | ICD-10-CM

## 2015-02-04 DIAGNOSIS — C679 Malignant neoplasm of bladder, unspecified: Secondary | ICD-10-CM

## 2015-02-04 DIAGNOSIS — E785 Hyperlipidemia, unspecified: Secondary | ICD-10-CM

## 2015-02-04 DIAGNOSIS — I2510 Atherosclerotic heart disease of native coronary artery without angina pectoris: Secondary | ICD-10-CM

## 2015-02-04 NOTE — Patient Instructions (Signed)
**Note De-identified Cody Thomas Obfuscation** Medication Instructions:  Same-no changes  Labwork: None  Testing/Procedures: None  Follow-Up: Your physician wants you to follow-up in: 6 months. You will receive a reminder letter in the mail two months in advance. If you don't receive a letter, please call our office to schedule the follow-up appointment.      If you need a refill on your cardiac medications before your next appointment, please call your pharmacy.   

## 2015-02-04 NOTE — Progress Notes (Addendum)
.     [address], [address] [place], [place]  [address] Phone: [phone] Fax:  [phone]  Date:  02/04/2015   ID:  TIBURCIO SHIRKEY, DOB 1931/02/27, MRN [id number]  PCP:  Gerrit Heck, MD   History of Present Illness: Cody Thomas is a 81 y.o. male with coronary disease status post 4 vessel bypass surgery 10/29/11 by Dr. Darcey Nora here for followup. He a brief postoperative atrial fibrillation. He has had some irritation in his chest wall post bypass. Otherwise is feeling quite well. He does yard work without difficulty.   Had to stop Zetia and Crestor becuase of cost. On atorvastatin periodically.  Overall no anginal like symptoms.  Unfortunately found bladder cancer after bout of kidney stone. He is now in remission and is being grossly watched by Dr. Rosana Hoes.  Had diarrhea for about 6 weeks. CO-Q10 stopped and diarrhea stopped (2-3 day).  He sometimes sees his fish oil tablet in his stool.   he is back on sertraline for depression.   Wt Readings from Last 3 Encounters:  02/04/15 196 lb 3.2 oz (88.996 kg)  01/21/15 195 lb 6 oz (88.622 kg)  12/12/14 197 lb 3.2 oz (89.449 kg)     Past Medical History  Diagnosis Date  . Hyperlipidemia     Crestor 3 x wk and Zetia daily  . Hx of cardiac cath   . Coronary atherosclerosis of native coronary artery     2009 LAD CIRC DES  . Chronic kidney disease     stage 111  . PONV (postoperative nausea and vomiting)     pt states extremely sick  . Hypertension     takes Hyzaar daily  . Shortness of breath     with exertion  . Headache(784.0)   . Dizziness   . Joint pain   . History of gout     doesn't require meds  . Cancer (HCC)     nose/Dr. Albertini;skin  . H/O hiatal hernia   . GERD (gastroesophageal reflux disease)     TUMS prn  . Chronic constipation   . Enlarged prostate     but doesn't require meds at present  . Diabetes mellitus     takes Januvia daily  . Anxiety   . Depression    doesn't take any meds for this  . Insomnia     doesn't require meds at present time  . Onychomycosis 10/05/2012    x 10  . Bunion 09/29/2011    Past Surgical History  Procedure Laterality Date  . Cholecystectomy    . Eye surgery      bilateral cataracts  . Hemorrhoid surgery    . Rotator cuff surgery      right   . Hernia repair      x 2;inguinal  . Cyst removed from finger      right hand  . Cataracts removed    . Skin cancer removed    . Cardiac catheterization  2013  . Coronary angioplasty      2 stents from 2009  . Colonoscopy    . Coronary artery bypass graft  10/29/2011    Procedure: CORONARY ARTERY BYPASS GRAFTING (CABG);  Surgeon: Ivin Poot, MD;  Location: [facility];  Service: Open Heart Surgery;  Laterality: N/A;  . Left heart catheterization with coronary angiogram N/A 10/21/2011    Procedure: LEFT HEART CATHETERIZATION WITH CORONARY ANGIOGRAM;  Surgeon: Candee Furbish, MD;  Location: [facility] C[facility] LAB;  Service: Cardiovascular;  Laterality: N/A;  . Kidney stone surgery    . Bladder cancer      Current Outpatient Prescriptions  Medication Sig Dispense Refill  . aspirin 325 MG tablet Take 325 mg by mouth daily.    Marland Kitchen atorvastatin (LIPITOR) 80 MG tablet Take 80 mg by mouth daily. Patient says he takes it 2 times a week    . calcium carbonate (TUMS - DOSED IN MG ELEMENTAL CALCIUM) 500 MG chewable tablet Chew 2 tablets by mouth daily as needed. For indigestion.     . Coenzyme Q10-Vitamin E 100-1 MG-UNT/5ML SYRP Take 100 mg by mouth daily. Take 2 tablespoons (100MG ) Daily    . fish oil-omega-3 fatty acids 1000 MG capsule Take 1,200 mg by mouth daily. 4 1200 tablets daily    . fluticasone (FLONASE) 50 MCG/ACT nasal spray Place 1 spray into both nostrils daily as needed for allergies or rhinitis.    Marland Kitchen glimepiride (AMARYL) 2 MG tablet Take 1 tablet (2 mg total) by mouth daily. (Patient taking differently: Take 1 mg by mouth daily. ) 90 tablet 2  . LORazepam (ATIVAN) 0.5 MG tablet  Take 0.5 mg by mouth as needed for anxiety.    Marland Kitchen losartan-hydrochlorothiazide (HYZAAR) 100-25 MG per tablet Take 1 tablet by mouth daily. 90 tablet 2  . metFORMIN (GLUCOPHAGE-XR) 500 MG 24 hr tablet TAKE 1 TABLET BY MOUTH TWICE DAILY 180 tablet 3  . metoprolol tartrate (LOPRESSOR) 25 MG tablet TAKE 1 TABLET BY MOUTH TWICE DAILY 180 tablet 0  . sertraline (ZOLOFT) 50 MG tablet Take 50 mg by mouth daily.    . tamsulosin (FLOMAX) 0.4 MG CAPS capsule Take 1 capsule (0.4 mg total) by mouth daily. 14 capsule 0   No current facility-administered medications for this visit.    Allergies:    Allergies  Allergen Reactions  . Crestor [Rosuvastatin]     Nightmares in high dosage  . Zocor [Simvastatin]     Nightmares in high dosage    Social History:  The patient  reports that he quit smoking about 30 years ago. His smoking use included Cigarettes. He has a 45 pack-year smoking history. He has never used smokeless tobacco. He reports that he does not drink alcohol or use illicit drugs.   ROS:  Please see the history of present illness.   Mild HA at times. Chest soreness. No CVA. Positive waking. Trouble with dyspnea when bending over.  PHYSICAL EXAM: VS:  BP 150/72 mmHg  Pulse 63  Ht 5\' 6"  (1.676 m)  Wt 196 lb 3.2 oz (88.996 kg)  BMI 31.68 kg/m2  SpO2 95% Well nourished, well developed, in no acute distress HEENT: normal Neck: no JVD Cardiac:  normal S1, S2; RRR; no murmur Lungs:  clear to auscultation bilaterally, no wheezing, rhonchi or rales Abd: soft, nontender, no hepatomegalyoverweight. Protuberant Ext: no edema Skin: warm and dry Neuro: no focal abnormalities noted  EKG: 08/21/13 - Sinus bradycardia, first degree AV block, PR interval 298 ms otherwise unremarkable.  ASSESSMENT AND PLAN:  1. Hyperlipidemia-too expensive Crestor 10mg  ( although now he thinks that the generic Crestor may be reasonable Price). Atorvastatin 80 mg perhaps twice a week.  Co-Q 10 suggested.  He actually  has had very good success from LDL down to 75 on high-dose atorvastatin. Dr. Drema Dallas recently performed lab work. LDL October 2016 was 72. 2. First degree AVB - 242ms. Stable. No syncope. 3.  occasional palpitations-continuing with low-dose metoprolol. I am aware of his first-degree  AV block. If this worsens, we will need to stop this medication. We discussed the possibility of pacemaker in the future.  4. Hypertension-elevated today. Usually it is in normal range at home , although recent doctor visits has been somewhat elevated. Continue to encourage weight loss, low salt diet.. Continue with current medications.  5. CAD-stable, post bypass. No current typical anginal symptoms. Still with some irritation left sternal region. 6. Obesity-continue to encourage weight loss. 7. Bladder cancer-Dr. Rosana Hoes. Post chemotherapy. Found after kidney stone. 8. Six-month follow-up  Signed, Candee Furbish, MD [facility]  02/04/2015 9:25 AM

## 2015-02-12 ENCOUNTER — Ambulatory Visit: Payer: Medicare Other | Admitting: Cardiology

## 2015-03-11 ENCOUNTER — Other Ambulatory Visit: Payer: Medicare Other

## 2015-03-12 ENCOUNTER — Other Ambulatory Visit (INDEPENDENT_AMBULATORY_CARE_PROVIDER_SITE_OTHER): Payer: Medicare Other

## 2015-03-12 DIAGNOSIS — E119 Type 2 diabetes mellitus without complications: Secondary | ICD-10-CM

## 2015-03-12 LAB — MICROALBUMIN / CREATININE URINE RATIO
Creatinine,U: 229.8 mg/dL
Microalb Creat Ratio: 5.8 mg/g (ref 0.0–30.0)
Microalb, Ur: 13.4 mg/dL — ABNORMAL HIGH (ref 0.0–1.9)

## 2015-03-12 LAB — HEMOGLOBIN A1C: Hgb A1c MFr Bld: 6.1 % (ref 4.6–6.5)

## 2015-03-12 LAB — COMPREHENSIVE METABOLIC PANEL
ALT: 16 U/L (ref 0–53)
AST: 18 U/L (ref 0–37)
Albumin: 4.3 g/dL (ref 3.5–5.2)
Alkaline Phosphatase: 41 U/L (ref 39–117)
BUN: 22 mg/dL (ref 6–23)
CO2: 29 mEq/L (ref 19–32)
Calcium: 9.8 mg/dL (ref 8.4–10.5)
Chloride: 100 mEq/L (ref 96–112)
Creatinine, Ser: 1.38 mg/dL (ref 0.40–1.50)
GFR: 52.37 mL/min — ABNORMAL LOW (ref >60.00)
Glucose, Bld: 152 mg/dL — ABNORMAL HIGH (ref 70–99)
Potassium: 3.5 mEq/L (ref 3.5–5.1)
Sodium: 139 meq/L (ref 135–145)
Total Bilirubin: 1.2 mg/dL (ref 0.2–1.2)
Total Protein: 7.2 g/dL (ref 6.0–8.3)

## 2015-03-13 ENCOUNTER — Other Ambulatory Visit: Payer: Self-pay | Admitting: Cardiology

## 2015-03-14 ENCOUNTER — Ambulatory Visit: Payer: Medicare Other | Admitting: Endocrinology

## 2015-03-25 ENCOUNTER — Ambulatory Visit (INDEPENDENT_AMBULATORY_CARE_PROVIDER_SITE_OTHER): Payer: Medicare Other | Admitting: Endocrinology

## 2015-03-25 ENCOUNTER — Encounter: Payer: Self-pay | Admitting: Endocrinology

## 2015-03-25 VITALS — BP 144/82 | HR 61 | Temp 98.8°F | Resp 16 | Ht 66.0 in | Wt 183.6 lb

## 2015-03-25 DIAGNOSIS — E119 Type 2 diabetes mellitus without complications: Secondary | ICD-10-CM

## 2015-03-25 NOTE — Patient Instructions (Signed)
Leave off Glimeperide and call if sugars get over 200 after meals or 150 in am

## 2015-03-25 NOTE — Progress Notes (Signed)
Patient ID: Cody Thomas, male   DOB: 01-31-1931, 82 y.o.   MRN: [id number]   Reason for Appointment: Diabetes follow-up   History of Present Illness   Diagnosis: Type 2 DIABETES MELITUS  He has had mild diabetes which has been previously erratic partly because of dietary inconsistency and difficulty with weight loss He has not been continued on metformin because of renal dysfunction Was given low-dose Januvia instead but this was too expensive for him even though it was helping his control In 2014 he was given low dose Amaryl instead which appears to be keeping his blood sugars controlled  His blood sugars were not well controlled when he was seen in 1/16 with A1c 7.8  Recent history:  Since 1/16 he has been on metformin in addition to his Amaryl and he is  taking 1000 mg daily without any side effects. Also his Amaryl was reduced to 1 mg daily which he takes in the mornings   More recently his blood sugars have been overall fairly good although periodically high in the evening probably after meals He is compliant with medications  He did feel shaky once at around 9 PM when he had glucose of 76, he does not know why. A1c is now again excellent at 6.1 and relatively lower  Over the last few weeks has lost weight from decreased appetite, intercurrent illness and cutting out sweets and cookies            Monitors blood glucose: Once a day or less .    Glucometer:   Accu-Chek         Blood Glucose readings from meter download:  Mean values apply above for all meters except median for One Touch  PRE-MEAL Fasting Lunch Dinner  PCS  Overall  Glucose range:  104-156   97-166   131-229   76-194   76-229   Mean/median:  133     143   137      Physical activity: exercise: Very little recently, does not go outside to walk because of cord by the              Wt Readings from Last 3 Encounters:  03/25/15 183 lb 9.6 oz (83.28 kg)  02/04/15 196 lb 3.2 oz (88.996 kg)    01/21/15 195 lb 6 oz (88.622 kg)   DM LABS:  Lab Results  Component Value Date   HGBA1C 6.1 03/12/2015   HGBA1C 6.3 12/07/2014   HGBA1C 6.3 08/06/2014   Lab Results  Component Value Date   MICROALBUR 13.4* 03/12/2015   LDLCALC 72 12/07/2014   CREATININE 1.38 03/12/2015   No visits with results within 1 Week(s) from this visit. Latest known visit with results is:  Lab on 03/12/2015  Component Date Value Ref Range Status  . Hgb A1c MFr Bld 03/12/2015 6.1  4.6 - 6.5 % Final   Glycemic Control Guidelines for People with Diabetes:Non Diabetic:  <6%Goal of Therapy: <7%Additional Action Suggested:  >8%   . Sodium 03/12/2015 139  135 - 145 mEq/L Final  . Potassium 03/12/2015 3.5  3.5 - 5.1 mEq/L Final  . Chloride 03/12/2015 100  96 - 112 mEq/L Final  . CO2 03/12/2015 29  19 - 32 mEq/L Final  . Glucose, Bld 03/12/2015 152* 70 - 99 mg/dL Final  . BUN 11/91/4782 22  6 - 23 mg/dL Final  . Creatinine, Ser 03/12/2015 1.38  0.40 - 1.50 mg/dL Final  . Total Bilirubin 03/12/2015 1.2  0.2 - 1.2 mg/dL Final  . Alkaline Phosphatase 03/12/2015 41  39 - 117 U/L Final  . AST 03/12/2015 18  0 - 37 U/L Final  . ALT 03/12/2015 16  0 - 53 U/L Final  . Total Protein 03/12/2015 7.2  6.0 - 8.3 g/dL Final  . Albumin 16/12/9602 4.3  3.5 - 5.2 g/dL Final  . Calcium 54/10/8117 9.8  8.4 - 10.5 mg/dL Final  . GFR 14/78/2956 52.37* >60.00 mL/min Final  . Microalb, Ur 03/12/2015 13.4* 0.0 - 1.9 mg/dL Final  . Creatinine,U 21/30/8657 229.8   Final  . Microalb Creat Ratio 03/12/2015 5.8  0.0 - 30.0 mg/g Final       Medication List       This list is accurate as of: 03/25/15  8:54 PM.  Always use your most recent med list.               aspirin 325 MG tablet  Take 325 mg by mouth daily.     atorvastatin 80 MG tablet  Commonly known as:  LIPITOR  Take 80 mg by mouth daily. Patient says he takes it 2 times a week     calcium carbonate 500 MG chewable tablet  Commonly known as:  TUMS - dosed in mg  elemental calcium  Chew 2 tablets by mouth daily as needed. For indigestion.     Coenzyme Q10-Vitamin E 100-1 MG-UNT/5ML Syrp  Take 100 mg by mouth daily. Take 2 tablespoons (100MG ) Daily     fish oil-omega-3 fatty acids 1000 MG capsule  Take 1,200 mg by mouth daily. 4 1200 tablets daily     fluticasone 50 MCG/ACT nasal spray  Commonly known as:  FLONASE  Place 1 spray into both nostrils daily as needed for allergies or rhinitis.     glimepiride 2 MG tablet  Commonly known as:  AMARYL  Take 1 tablet (2 mg total) by mouth daily.     LORazepam 0.5 MG tablet  Commonly known as:  ATIVAN  Take 0.5 mg by mouth as needed for anxiety.     losartan-hydrochlorothiazide 100-25 MG tablet  Commonly known as:  HYZAAR  TAKE 1 TABLET BY MOUTH DAILY     metFORMIN 500 MG 24 hr tablet  Commonly known as:  GLUCOPHAGE-XR  TAKE 1 TABLET BY MOUTH TWICE DAILY     metoprolol tartrate 25 MG tablet  Commonly known as:  LOPRESSOR  TAKE 1 TABLET BY MOUTH TWICE DAILY     sertraline 50 MG tablet  Commonly known as:  ZOLOFT  Take 50 mg by mouth daily.     tamsulosin 0.4 MG Caps capsule  Commonly known as:  FLOMAX  Take 1 capsule (0.4 mg total) by mouth daily.        Allergies:  Allergies  Allergen Reactions  . Crestor [Rosuvastatin]     Nightmares in high dosage  . Zocor [Simvastatin]     Nightmares in high dosage    Past Medical History  Diagnosis Date  . Hyperlipidemia     Crestor 3 x wk and Zetia daily  . Hx of cardiac cath   . Coronary atherosclerosis of native coronary artery     2009 LAD CIRC DES  . Chronic kidney disease     stage 111  . PONV (postoperative nausea and vomiting)     pt states extremely sick  . Hypertension     takes Hyzaar daily  . Shortness of breath     with exertion  .  Headache(784.0)   . Dizziness   . Joint pain   . History of gout     doesn't require meds  . Cancer (HCC)     nose/Dr. Albertini;skin  . H/O hiatal hernia   . GERD (gastroesophageal  reflux disease)     TUMS prn  . Chronic constipation   . Enlarged prostate     but doesn't require meds at present  . Diabetes mellitus     takes Januvia daily  . Anxiety   . Depression     doesn't take any meds for this  . Insomnia     doesn't require meds at present time  . Onychomycosis 10/05/2012    x 10  . Bunion 09/29/2011    Past Surgical History  Procedure Laterality Date  . Cholecystectomy    . Eye surgery      bilateral cataracts  . Hemorrhoid surgery    . Rotator cuff surgery      right   . Hernia repair      x 2;inguinal  . Cyst removed from finger      right hand  . Cataracts removed    . Skin cancer removed    . Cardiac catheterization  2013  . Coronary angioplasty      2 stents from 2009  . Colonoscopy    . Coronary artery bypass graft  10/29/2011    Procedure: CORONARY ARTERY BYPASS GRAFTING (CABG);  Surgeon: Kerin Perna, MD;  Location[facility] OR;  Service: Open Heart Surgery;  Laterality: N/A;  . Left heart catheterization with coronary angiogram N/A 10/21/2011    Procedure: LEFT HEART CATHETERIZATION WITH CORONARY ANGIOGRAM;  Surgeon: Donato Schultz, MD;  Location: [facility] CATH LAB;  Service: Cardiovascular;  Laterality: N/A;  . Kidney stone surgery    . Bladder cancer      Family History  Problem Relation Age of Onset  . Stroke Father   . Cancer Brother     Social History:  reports that he quit smoking about 31 years ago. His smoking use included Cigarettes. He has a 45 pack-year smoking history. He has never used smokeless tobacco. He reports that he does not drink alcohol or use illicit drugs.  Review of Systems:  HYPERTENSION:  fairly well-controlled on losartan HCT, potassium is low normal   RENAL dysfunction: This is slightly better  Lab Results  Component Value Date   CREATININE 1.38 03/12/2015   BUN 22 03/12/2015   NA 139 03/12/2015   K 3.5 03/12/2015   CL 100 03/12/2015   CO2 29 03/12/2015     HYPERLIPIDEMIA: The lipid abnormality  consists of elevated LDL and low HDL treated with Lipitor   Apparently the 80 mg Lipitor dose prescribed by cardiologist causes nightmares and is taking only 40 mg 2 or 3 times a week, muscle aches are better with using CoQ10    Lab Results  Component Value Date   CHOL 144 12/07/2014   HDL 32.50* 12/07/2014   LDLCALC 72 12/07/2014   TRIG 195.0* 12/07/2014   CHOLHDL 4 12/07/2014    Thyroid nodule: He is due to have a follow-up ultrasound next month     Examination:   BP 144/82 mmHg  Pulse 61  Temp(Src) 98.8 F (37.1 C)  Resp 16  Ht 5\' 6"  (1.676 m)  Wt 183 lb 9.6 oz (83.28 kg)  BMI 29.65 kg/m2  SpO2 91%  Body mass index is 29.65 kg/(m^2).  .    ASSESSMENT/ PLAN:  Diabetes type 2   The patient's diabetes control is excellent with only 6.1 now More recently has lost 13 pounds and is cutting back on sweets and generally portions  Considering his age and need to avoid hypoglycemia he may do well with metformin only Will try to stop his Amaryl, discussed that if he has high readings again will change his Amaryl to 1 mg, half tablet daily  Encouraged him to go to an indoor location to walk regularly also  Since his kidney function is reasonably good and he is benefiting from metformin he will continue 1000 mg daily  THYROID nodule: We will discuss results when repeat ultrasound is available     Patient Instructions  Leave off Glimeperide and call if sugars get over 200 after meals or 150 in am        [facility] 03/25/2015, 8:54 PM

## 2015-04-03 ENCOUNTER — Ambulatory Visit (INDEPENDENT_AMBULATORY_CARE_PROVIDER_SITE_OTHER)
Admission: RE | Admit: 2015-04-03 | Discharge: 2015-04-03 | Disposition: A | Discharge status: Home / Self Care | Payer: Medicare Other | Source: Ambulatory Visit | Attending: Pulmonary Disease | Admitting: Pulmonary Disease

## 2015-04-03 DIAGNOSIS — R918 Other nonspecific abnormal finding of lung field: Secondary | ICD-10-CM

## 2015-04-04 DIAGNOSIS — I10 Essential (primary) hypertension: Secondary | ICD-10-CM | POA: Diagnosis not present

## 2015-04-04 DIAGNOSIS — F339 Major depressive disorder, recurrent, unspecified: Secondary | ICD-10-CM | POA: Diagnosis not present

## 2015-04-04 DIAGNOSIS — F419 Anxiety disorder, unspecified: Secondary | ICD-10-CM | POA: Diagnosis not present

## 2015-04-05 ENCOUNTER — Other Ambulatory Visit: Payer: Self-pay | Admitting: Pulmonary Disease

## 2015-04-05 DIAGNOSIS — R918 Other nonspecific abnormal finding of lung field: Secondary | ICD-10-CM

## 2015-04-05 NOTE — Progress Notes (Signed)
Notes Recorded by Juanito Doom, MD on 04/03/2015 at 4:21 PM Cody Thomas, Please let them know that his pulmonary nodules were noted to be stable, the radiologist has recommended that he have a repeat CT chest in 6 months. Please order. Thanks, Brent  Pt advised of CT results/reccomendations. Pt has appt scheduled for 04/09/15.

## 2015-04-09 ENCOUNTER — Encounter: Payer: Self-pay | Admitting: Pulmonary Disease

## 2015-04-09 ENCOUNTER — Ambulatory Visit (INDEPENDENT_AMBULATORY_CARE_PROVIDER_SITE_OTHER): Payer: Medicare Other | Admitting: Pulmonary Disease

## 2015-04-09 VITALS — BP 118/66 | HR 69 | Ht 66.5 in | Wt 185.2 lb

## 2015-04-09 DIAGNOSIS — R918 Other nonspecific abnormal finding of lung field: Secondary | ICD-10-CM

## 2015-04-09 NOTE — Assessment & Plan Note (Signed)
I have personally read the images from his CT chest from last week which shows no change in the nodules which are no larger than 5 mm in size. He has no concerning signs or symptoms on exam today that would suggest that these are malignant.  Because of his personal history of bladder cancer we need to continue to follow them closely  Plan: Repeat CT chest in 6 months

## 2015-04-09 NOTE — Progress Notes (Signed)
Subjective:    Patient ID: Cody Thomas, male    DOB: [birth year], 82 y.o.   MRN: [id number]  Synopsis: First seen by [facility] in August 2016 for pulmonary nodules. He has a personal history of bladder cancer.  HPI Chief Complaint  Patient presents with  . Follow-up    Review CT from 2/1.  no complaints today.     He returns to clinic today to discuss the results of his CT scan. He has been feeling well recently, no problems breathing. He has no cough, hemoptysis, or weight loss. He does have a very mild pain in his axilla on the right side. He notes that he has been struggling with anxiety recently.  Past Medical History  Diagnosis Date  . Hyperlipidemia     Crestor 3 x wk and Zetia daily  . Hx of cardiac cath   . Coronary atherosclerosis of native coronary artery     2009 LAD CIRC DES  . Chronic kidney disease     stage 111  . PONV (postoperative nausea and vomiting)     pt states extremely sick  . Hypertension     takes Hyzaar daily  . Shortness of breath     with exertion  . Headache(784.0)   . Dizziness   . Joint pain   . History of gout     doesn't require meds  . Cancer (HCC)     nose/Dr. Albertini;skin  . H/O hiatal hernia   . GERD (gastroesophageal reflux disease)     TUMS prn  . Chronic constipation   . Enlarged prostate     but doesn't require meds at present  . Diabetes mellitus     takes Januvia daily  . Anxiety   . Depression     doesn't take any meds for this  . Insomnia     doesn't require meds at present time  . Onychomycosis 10/05/2012    x 10  . Bunion 09/29/2011      Review of Systems     Objective:   Physical Exam Filed Vitals:   04/09/15 1422  BP: 118/66  Pulse: 69  Height: 5' 6.5" (1.689 m)  Weight: 185 lb 3.2 oz (84.006 kg)  SpO2: 96%   Room air  Gen: well appearing HENT: OP clear, TM's clear, neck supple PULM: CTA B, normal percussion CV: RRR, no mgr, trace edema GI: BS+, soft, nontender Derm: no  cyanosis or rash Psyche: normal mood and affect MSK: no bony abnormality or tenderness on exam R axillla  CT chest images from last week were personally reviewed showing very small pulmonary nodules, the largest of which is 5 mm in the lingula no change compared to prior.       Assessment & Plan:  Pulmonary nodules/lesions, multiple I have personally read the images from his CT chest from last week which shows no change in the nodules which are no larger than 5 mm in size. He has no concerning signs or symptoms on exam today that would suggest that these are malignant.  Because of his personal history of bladder cancer we need to continue to follow them closely  Plan: Repeat CT chest in 6 months     Current outpatient prescriptions:  .  aspirin 325 MG tablet, Take 325 mg by mouth daily., Disp: , Rfl:  .  atorvastatin (LIPITOR) 80 MG tablet, Take 80 mg by mouth daily. Patient says he takes it 2 times a week, Disp: , Rfl:  .  calcium carbonate (TUMS - DOSED IN MG ELEMENTAL CALCIUM) 500 MG chewable tablet, Chew 2 tablets by mouth daily as needed. For indigestion. , Disp: , Rfl:  .  Coenzyme Q10-Vitamin E 100-1 MG-UNT/5ML SYRP, Take 100 mg by mouth daily. Take 2 tablespoons (100MG ) Daily, Disp: , Rfl:  .  fish oil-omega-3 fatty acids 1000 MG capsule, Take 1,200 mg by mouth daily. 4 1200 tablets daily, Disp: , Rfl:  .  fluticasone (FLONASE) 50 MCG/ACT nasal spray, Place 1 spray into both nostrils daily as needed for allergies or rhinitis., Disp: , Rfl:  .  LORazepam (ATIVAN) 0.5 MG tablet, Take 0.5 mg by mouth as needed for anxiety., Disp: , Rfl:  .  losartan-hydrochlorothiazide (HYZAAR) 100-25 MG tablet, TAKE 1 TABLET BY MOUTH DAILY, Disp: 90 tablet, Rfl: 3 .  metFORMIN (GLUCOPHAGE-XR) 500 MG 24 hr tablet, TAKE 1 TABLET BY MOUTH TWICE DAILY, Disp: 180 tablet, Rfl: 3 .  metoprolol tartrate (LOPRESSOR) 25 MG tablet, TAKE 1 TABLET BY MOUTH TWICE DAILY, Disp: 180 tablet, Rfl: 0 .  sertraline  (ZOLOFT) 50 MG tablet, Take 100 mg by mouth daily. , Disp: , Rfl:  .  tamsulosin (FLOMAX) 0.4 MG CAPS capsule, Take 1 capsule (0.4 mg total) by mouth daily., Disp: 14 capsule, Rfl: 0

## 2015-04-09 NOTE — Patient Instructions (Signed)
We will order another CT scan of your chest in 6 months and then see you after that Please let us know if you have problems with cough, coughing up blood, chest pain, or unexplained weight loss

## 2015-04-15 ENCOUNTER — Other Ambulatory Visit: Payer: Medicare Other

## 2015-04-18 ENCOUNTER — Ambulatory Visit
Admission: RE | Admit: 2015-04-18 | Discharge: 2015-04-18 | Disposition: A | Discharge status: Home / Self Care | Payer: Medicare Other | Source: Ambulatory Visit | Attending: Endocrinology | Admitting: Endocrinology

## 2015-04-18 DIAGNOSIS — E041 Nontoxic single thyroid nodule: Secondary | ICD-10-CM | POA: Diagnosis not present

## 2015-04-18 NOTE — Progress Notes (Signed)
Quick Note:  Please let patient know that the thyroid nodule is slightly smaller than before, this is good news so we will not need to check this again ______

## 2015-04-19 ENCOUNTER — Telehealth: Payer: Self-pay | Admitting: Endocrinology

## 2015-04-19 NOTE — Telephone Encounter (Signed)
Pt called again to talk to you.

## 2015-04-19 NOTE — Telephone Encounter (Signed)
Patient is calling for the results of his ultrasound °

## 2015-04-19 NOTE — Telephone Encounter (Signed)
Results given to patient

## 2015-04-23 ENCOUNTER — Other Ambulatory Visit: Payer: Self-pay | Admitting: Cardiology

## 2015-05-08 ENCOUNTER — Encounter: Payer: Self-pay | Admitting: Podiatry

## 2015-05-08 ENCOUNTER — Ambulatory Visit (INDEPENDENT_AMBULATORY_CARE_PROVIDER_SITE_OTHER): Payer: Medicare Other | Admitting: Podiatry

## 2015-05-08 DIAGNOSIS — B351 Tinea unguium: Secondary | ICD-10-CM | POA: Diagnosis not present

## 2015-05-08 DIAGNOSIS — M79676 Pain in unspecified toe(s): Secondary | ICD-10-CM

## 2015-05-08 NOTE — Progress Notes (Signed)
Patient ID: Cody Thomas, male   DOB: [birth year], 82 y.o.   MRN: [id number]  Subjective: This patient presents again for schedule visit complaining of painful toenails walking wearing shoes and request toenail debridement  Objective: Orientated 3 No skin lesions bilaterally The toenails are elongated, brittle, hypertrophic, incurvated, discolored and tender to direct palpation 6-10  Assessment: Symptomatic onychomycoses 6-10 Type II diabetic  Plan: Debrided toenails 10 mechanically and electrically. Slight bleeding distal third right toe dressed with antibiotic ointment and Band-Aid. Patient advised to continue applying topical antibiotic ointment and Band-Aid to this area for removal in 1-3 days daily until a scab forms  Reappoint 3 months

## 2015-05-08 NOTE — Patient Instructions (Signed)
Removed Band-Aid on third right toe 1-3 days and continue to apply topical antibiotic ointment and Band-Aid daily until a scab forms  Diabetes and Foot Care Diabetes may cause you to have problems because of poor blood supply (circulation) to your feet and legs. This may cause the skin on your feet to become thinner, break easier, and heal more slowly. Your skin may become dry, and the skin may peel and crack. You may also have nerve damage in your legs and feet causing decreased feeling in them. You may not notice minor injuries to your feet that could lead to infections or more serious problems. Taking care of your feet is one of the most important things you can do for yourself.  HOME CARE INSTRUCTIONS  Wear shoes at all times, even in the house. Do not go barefoot. Bare feet are easily injured.  Check your feet daily for blisters, cuts, and redness. If you cannot see the bottom of your feet, use a mirror or ask someone for help.  Wash your feet with warm water (do not use hot water) and mild soap. Then pat your feet and the areas between your toes until they are completely dry. Do not soak your feet as this can dry your skin.  Apply a moisturizing lotion or petroleum jelly (that does not contain alcohol and is unscented) to the skin on your feet and to dry, brittle toenails. Do not apply lotion between your toes.  Trim your toenails straight across. Do not dig under them or around the cuticle. File the edges of your nails with an emery board or nail file.  Do not cut corns or calluses or try to remove them with medicine.  Wear clean socks or stockings every day. Make sure they are not too tight. Do not wear knee-high stockings since they may decrease blood flow to your legs.  Wear shoes that fit properly and have enough cushioning. To break in new shoes, wear them for just a few hours a day. This prevents you from injuring your feet. Always look in your shoes before you put them on to be sure  there are no objects inside.  Do not cross your legs. This may decrease the blood flow to your feet.  If you find a minor scrape, cut, or break in the skin on your feet, keep it and the skin around it clean and dry. These areas may be cleansed with mild soap and water. Do not cleanse the area with peroxide, alcohol, or iodine.  When you remove an adhesive bandage, be sure not to damage the skin around it.  If you have a wound, look at it several times a day to make sure it is healing.  Do not use heating pads or hot water bottles. They may burn your skin. If you have lost feeling in your feet or legs, you may not know it is happening until it is too late.  Make sure your health care provider performs a complete foot exam at least annually or more often if you have foot problems. Report any cuts, sores, or bruises to your health care provider immediately. SEEK MEDICAL CARE IF:   You have an injury that is not healing.  You have cuts or breaks in the skin.  You have an ingrown nail.  You notice redness on your legs or feet.  You feel burning or tingling in your legs or feet.  You have pain or cramps in your legs and feet.  Your legs or feet are numb.  Your feet always feel cold. SEEK IMMEDIATE MEDICAL CARE IF:   There is increasing redness, swelling, or pain in or around a wound.  There is a red line that goes up your leg.  Pus is coming from a wound.  You develop a fever or as directed by your health care provider.  You notice a bad smell coming from an ulcer or wound.   This information is not intended to replace advice given to you by your health care provider. Make sure you discuss any questions you have with your health care provider.   Document Released: 02/14/2000 Document Revised: 10/19/2012 Document Reviewed: 07/26/2012 Elsevier Interactive Patient Education [employer].

## 2015-05-18 DIAGNOSIS — E119 Type 2 diabetes mellitus without complications: Secondary | ICD-10-CM | POA: Diagnosis not present

## 2015-05-27 DIAGNOSIS — N200 Calculus of kidney: Secondary | ICD-10-CM | POA: Diagnosis not present

## 2015-05-27 DIAGNOSIS — C672 Malignant neoplasm of lateral wall of bladder: Secondary | ICD-10-CM | POA: Diagnosis not present

## 2015-06-17 ENCOUNTER — Other Ambulatory Visit (INDEPENDENT_AMBULATORY_CARE_PROVIDER_SITE_OTHER): Payer: Medicare Other

## 2015-06-17 DIAGNOSIS — E119 Type 2 diabetes mellitus without complications: Secondary | ICD-10-CM

## 2015-06-17 LAB — COMPREHENSIVE METABOLIC PANEL
ALT: 15 U/L (ref 0–53)
AST: 18 U/L (ref 0–37)
Albumin: 4 g/dL (ref 3.5–5.2)
Alkaline Phosphatase: 40 U/L (ref 39–117)
BUN: 25 mg/dL — ABNORMAL HIGH (ref 6–23)
CO2: 29 mEq/L (ref 19–32)
Calcium: 9.6 mg/dL (ref 8.4–10.5)
Chloride: 101 mEq/L (ref 96–112)
Creatinine, Ser: 1.42 mg/dL (ref 0.40–1.50)
GFR: 50.64 mL/min — ABNORMAL LOW (ref >60.00)
Glucose, Bld: 141 mg/dL — ABNORMAL HIGH (ref 70–99)
Potassium: 3.7 meq/L (ref 3.5–5.1)
Sodium: 138 mEq/L (ref 135–145)
Total Bilirubin: 1 mg/dL (ref 0.2–1.2)
Total Protein: 6.9 g/dL (ref 6.0–8.3)

## 2015-06-17 LAB — HEMOGLOBIN A1C: Hgb A1c MFr Bld: 6.2 % (ref 4.6–6.5)

## 2015-06-20 ENCOUNTER — Ambulatory Visit: Payer: Medicare Other | Admitting: Endocrinology

## 2015-07-08 ENCOUNTER — Other Ambulatory Visit: Payer: Self-pay | Admitting: Cardiology

## 2015-07-09 ENCOUNTER — Ambulatory Visit (INDEPENDENT_AMBULATORY_CARE_PROVIDER_SITE_OTHER): Payer: Medicare Other | Admitting: Endocrinology

## 2015-07-09 ENCOUNTER — Encounter: Payer: Self-pay | Admitting: Endocrinology

## 2015-07-09 VITALS — BP 130/65 | HR 64 | Temp 97.8°F | Resp 14 | Ht 66.5 in | Wt 188.2 lb

## 2015-07-09 DIAGNOSIS — E119 Type 2 diabetes mellitus without complications: Secondary | ICD-10-CM

## 2015-07-09 NOTE — Progress Notes (Signed)
Patient ID: Cody Thomas, male   DOB: [birth year], 82 y.o.   MRN: [id number]   Reason for Appointment: Diabetes follow-up   History of Present Illness   Diagnosis: Type 2 DIABETES MELITUS  He has had mild diabetes which has been previously erratic partly because of dietary inconsistency and difficulty with weight loss He has not been continued on metformin because of renal dysfunction Was given low-dose Januvia instead but this was too expensive for him even though it was helping his control In 2014 he was given low dose Amaryl instead which appears to be keeping his blood sugars controlled  His blood sugars were not well controlled when he was seen in 1/16 with A1c 7.8  Recent history:  Since 1/16 he has been on metformin and he is  taking 1000 mg daily without any side effects. Also his Amaryl was stopped on his last visit because of occasional mild hypoglycemia . A1c is now again excellent at 6.2 and stable  He does have periodic high post prandial readings based on his diet.  He is eating out at restaurants frequently            Monitors blood glucose: Once a day or less .    Glucometer:   Accu-Chek         Blood Glucose readings from meter download:  Mean values apply above for all meters except median for One Touch  PRE-MEAL Fasting Lunch Dinner Bedtime Overall  Glucose range: 104-141       Mean/median: 122    180  145    POST-MEAL PC Breakfast PC Lunch PC Dinner  Glucose range:  129, 164  111-251   Mean/median:       Physical activity: exercise: Just a little walking             Wt Readings from Last 3 Encounters:  07/09/15 188 lb 3.2 oz (85.367 kg)  04/09/15 185 lb 3.2 oz (84.006 kg)  03/25/15 183 lb 9.6 oz (83.28 kg)   DM LABS:  Lab Results  Component Value Date   HGBA1C 6.2 06/17/2015   HGBA1C 6.1 03/12/2015   HGBA1C 6.3 12/07/2014   Lab Results  Component Value Date   MICROALBUR 13.4* 03/12/2015   LDLCALC 72 12/07/2014   CREATININE  1.42 06/17/2015   No visits with results within 1 Week(s) from this visit. Latest known visit with results is:  Lab on 06/17/2015  Component Date Value Ref Range Status  . Hgb A1c MFr Bld 06/17/2015 6.2  4.6 - 6.5 % Final   Glycemic Control Guidelines for People with Diabetes:Non Diabetic:  <6%Goal of Therapy: <7%Additional Action Suggested:  >8%   . Sodium 06/17/2015 138  135 - 145 mEq/L Final  . Potassium 06/17/2015 3.7  3.5 - 5.1 mEq/L Final  . Chloride 06/17/2015 101  96 - 112 mEq/L Final  . CO2 06/17/2015 29  19 - 32 mEq/L Final  . Glucose, Bld 06/17/2015 141* 70 - 99 mg/dL Final  . BUN 40/98/1191 25* 6 - 23 mg/dL Final  . Creatinine, Ser 06/17/2015 1.42  0.40 - 1.50 mg/dL Final  . Total Bilirubin 06/17/2015 1.0  0.2 - 1.2 mg/dL Final  . Alkaline Phosphatase 06/17/2015 40  39 - 117 U/L Final  . AST 06/17/2015 18  0 - 37 U/L Final  . ALT 06/17/2015 15  0 - 53 U/L Final  . Total Protein 06/17/2015 6.9  6.0 - 8.3 g/dL Final  . Albumin 47/82/9562 4.0  3.5 - 5.2 g/dL Final  . Calcium 27/25/3664 9.6  8.4 - 10.5 mg/dL Final  . GFR 40/34/7425 50.64* >60.00 mL/min Final       Medication List       This list is accurate as of: 07/09/15 11:59 PM.  Always use your most recent med list.               aspirin 325 MG tablet  Take 325 mg by mouth daily.     atorvastatin 80 MG tablet  Commonly known as:  LIPITOR  Take 80 mg by mouth daily. Patient says he takes it 2 times a week     calcium carbonate 500 MG chewable tablet  Commonly known as:  TUMS - dosed in mg elemental calcium  Chew 2 tablets by mouth daily as needed. For indigestion.     Coenzyme Q10-Vitamin E 100-1 MG-UNT/5ML Syrp  Take 100 mg by mouth daily. Take 2 tablespoons (100MG ) Daily     fish oil-omega-3 fatty acids 1000 MG capsule  Take 1,200 mg by mouth daily. 4 1200 tablets daily     fluticasone 50 MCG/ACT nasal spray  Commonly known as:  FLONASE  Place 1 spray into both nostrils daily as needed for allergies  or rhinitis.     LORazepam 0.5 MG tablet  Commonly known as:  ATIVAN  Take 0.5 mg by mouth as needed for anxiety. Reported on 07/09/2015     losartan-hydrochlorothiazide 100-25 MG tablet  Commonly known as:  HYZAAR  TAKE 1 TABLET BY MOUTH DAILY     metFORMIN 500 MG 24 hr tablet  Commonly known as:  GLUCOPHAGE-XR  TAKE 1 TABLET BY MOUTH TWICE DAILY     metoprolol tartrate 25 MG tablet  Commonly known as:  LOPRESSOR  TAKE 1 TABLET BY MOUTH TWICE DAILY     sertraline 50 MG tablet  Commonly known as:  ZOLOFT  Take 100 mg by mouth daily. Reported on 07/09/2015        Allergies:  Allergies  Allergen Reactions  . Crestor [Rosuvastatin]     Nightmares in high dosage  . Zocor [Simvastatin]     Nightmares in high dosage    Past Medical History  Diagnosis Date  . Hyperlipidemia     Crestor 3 x wk and Zetia daily  . Hx of cardiac cath   . Coronary atherosclerosis of native coronary artery     2009 LAD CIRC DES  . Chronic kidney disease     stage 111  . PONV (postoperative nausea and vomiting)     pt states extremely sick  . Hypertension     takes Hyzaar daily  . Shortness of breath     with exertion  . Headache(784.0)   . Dizziness   . Joint pain   . History of gout     doesn't require meds  . Cancer (HCC)     nose/Dr. Albertini;skin  . H/O hiatal hernia   . GERD (gastroesophageal reflux disease)     TUMS prn  . Chronic constipation   . Enlarged prostate     but doesn't require meds at present  . Diabetes mellitus     takes Januvia daily  . Anxiety   . Depression     doesn't take any meds for this  . Insomnia     doesn't require meds at present time  . Onychomycosis 10/05/2012    x 10  . Bunion 09/29/2011    Past Surgical History  Procedure  Laterality Date  . Cholecystectomy    . Eye surgery      bilateral cataracts  . Hemorrhoid surgery    . Rotator cuff surgery      right   . Hernia repair      x 2;inguinal  . Cyst removed from finger      right  hand  . Cataracts removed    . Skin cancer removed    . Cardiac catheterization  2013  . Coronary angioplasty      2 stents from 2009  . Colonoscopy    . Coronary artery bypass graft  10/29/2011    Procedure: CORONARY ARTERY BYPASS GRAFTING (CABG);  Surgeon: Kerin Perna, MD;  Location: [facility] OR;  Service: Open Heart Surgery;  Laterality: N/A;  . Left heart catheterization with coronary angiogram N/A 10/21/2011    Procedure: LEFT HEART CATHETERIZATION WITH CORONARY ANGIOGRAM;  Surgeon: Donato Schultz, MD;  Location: [facility] CATH LAB;  Service: Cardiovascular;  Laterality: N/A;  . Kidney stone surgery    . Bladder cancer      Family History  Problem Relation Age of Onset  . Stroke Father   . Cancer Brother     Social History:  reports that he quit smoking about 31 years ago. His smoking use included Cigarettes. He has a 45 pack-year smoking history. He has never used smokeless tobacco. He reports that he does not drink alcohol or use illicit drugs.  Review of Systems:  HYPERTENSION:  fairly well-controlled on losartan HCT  RENAL dysfunction: This is Stable and mild  Lab Results  Component Value Date   CREATININE 1.42 06/17/2015   BUN 25* 06/17/2015   NA 138 06/17/2015   K 3.7 06/17/2015   CL 101 06/17/2015   CO2 29 06/17/2015     HYPERLIPIDEMIA: The lipid abnormality consists of elevated LDL and low HDL treated with Lipitor   Given 80 mg Lipitor dose by cardiologist, this causes nightmares and is taking only 40 mg 2 or 3 times a week, muscle aches are better with using CoQ10    Lab Results  Component Value Date   CHOL 144 12/07/2014   HDL 32.50* 12/07/2014   LDLCALC 72 12/07/2014   TRIG 195.0* 12/07/2014   CHOLHDL 4 12/07/2014    Thyroid nodule: The nodule was same or slightly smaller on his follow-up ultrasound exam     Examination:   BP 130/65 mmHg  Pulse 64  Temp(Src) 97.8 F (36.6 C)  Resp 14  Ht 5' 6.5" (1.689 m)  Wt 188 lb 3.2 oz (85.367 kg)  BMI 29.92  kg/m2  SpO2 97%  Body mass index is 29.92 kg/(m^2).  .    ASSESSMENT/ PLAN:   Diabetes type 2 with mild obesity  The patient's diabetes control is excellent with 6.2 even with not taking Amaryl. He is taking metformin only With not taking Amaryl he does have periodic high postprandial readings but this is dependent on his diet He does eat out a lot and needs more advice on nutrition, we will refer  Discussed watching portions and carbohydrates as well as high-fat meals  He does need to walk more consistently Encouraged him to go to an indoor location to walk regularly   Since his kidney function is reasonably good and he is benefiting from metformin he will continue 1000 mg daily  Follow-up in 4 months    There are no Patient Instructions on file for this visit.    Marcella Charlson 07/10/2015, 10:12 AM

## 2015-07-23 DIAGNOSIS — I10 Essential (primary) hypertension: Secondary | ICD-10-CM | POA: Diagnosis not present

## 2015-07-23 DIAGNOSIS — F339 Major depressive disorder, recurrent, unspecified: Secondary | ICD-10-CM | POA: Diagnosis not present

## 2015-07-23 DIAGNOSIS — I2510 Atherosclerotic heart disease of native coronary artery without angina pectoris: Secondary | ICD-10-CM | POA: Diagnosis not present

## 2015-07-23 DIAGNOSIS — R918 Other nonspecific abnormal finding of lung field: Secondary | ICD-10-CM | POA: Diagnosis not present

## 2015-07-23 DIAGNOSIS — E119 Type 2 diabetes mellitus without complications: Secondary | ICD-10-CM | POA: Diagnosis not present

## 2015-07-23 DIAGNOSIS — E7800 Pure hypercholesterolemia, unspecified: Secondary | ICD-10-CM | POA: Diagnosis not present

## 2015-07-23 DIAGNOSIS — Z8679 Personal history of other diseases of the circulatory system: Secondary | ICD-10-CM | POA: Diagnosis not present

## 2015-07-23 DIAGNOSIS — F419 Anxiety disorder, unspecified: Secondary | ICD-10-CM | POA: Diagnosis not present

## 2015-07-23 DIAGNOSIS — Z951 Presence of aortocoronary bypass graft: Secondary | ICD-10-CM | POA: Diagnosis not present

## 2015-07-23 DIAGNOSIS — N183 Chronic kidney disease, stage 3 (moderate): Secondary | ICD-10-CM | POA: Diagnosis not present

## 2015-08-06 ENCOUNTER — Other Ambulatory Visit: Payer: Self-pay | Admitting: Gastroenterology

## 2015-08-06 DIAGNOSIS — R1310 Dysphagia, unspecified: Secondary | ICD-10-CM | POA: Diagnosis not present

## 2015-08-06 DIAGNOSIS — Z1211 Encounter for screening for malignant neoplasm of colon: Secondary | ICD-10-CM | POA: Diagnosis not present

## 2015-08-06 DIAGNOSIS — K219 Gastro-esophageal reflux disease without esophagitis: Secondary | ICD-10-CM | POA: Diagnosis not present

## 2015-08-08 ENCOUNTER — Other Ambulatory Visit: Payer: Medicare Other

## 2015-08-12 ENCOUNTER — Ambulatory Visit (INDEPENDENT_AMBULATORY_CARE_PROVIDER_SITE_OTHER): Payer: Medicare Other | Admitting: Cardiology

## 2015-08-12 ENCOUNTER — Encounter: Payer: Self-pay | Admitting: Cardiology

## 2015-08-12 VITALS — BP 138/72 | HR 60 | Ht 66.0 in | Wt 188.4 lb

## 2015-08-12 DIAGNOSIS — I10 Essential (primary) hypertension: Secondary | ICD-10-CM

## 2015-08-12 DIAGNOSIS — C679 Malignant neoplasm of bladder, unspecified: Secondary | ICD-10-CM | POA: Diagnosis not present

## 2015-08-12 DIAGNOSIS — E785 Hyperlipidemia, unspecified: Secondary | ICD-10-CM | POA: Diagnosis not present

## 2015-08-12 DIAGNOSIS — I2510 Atherosclerotic heart disease of native coronary artery without angina pectoris: Secondary | ICD-10-CM | POA: Diagnosis not present

## 2015-08-12 NOTE — Progress Notes (Signed)
.     [address] [place], [place]  [address] Phone: [phone] Fax:  [phone]  Date:  08/12/2015   ID:  HAMSA BRACKMAN, DOB December 03, 1930, MRN [id number]  PCP:  Gaye Alken, MD   History of Present Illness: Cody Thomas is a 82 y.o. male with coronary disease status post 4 vessel bypass surgery 10/29/11 by Dr. Maren Beach here for followup. He a brief postoperative atrial fibrillation. He has had some irritation in his chest wall post bypass. Otherwise is feeling quite well. He does yard work without difficulty.   Had to stop Zetia and Crestor becuase of cost. On atorvastatin periodically.  Overall no anginal like symptoms.  Unfortunately found bladder cancer after bout of kidney stone. He is now in remission and is being grossly watched by Dr. Earlene Plater.  Had diarrhea for about 6 weeks. CO-Q10 stopped and diarrhea stopped (2-3 day).  He sometimes sees his fish oil tablet in his stool.   he is back on sertraline for depression.   Wt Readings from Last 3 Encounters:  08/12/15 188 lb 6.4 oz (85.458 kg)  07/09/15 188 lb 3.2 oz (85.367 kg)  04/09/15 185 lb 3.2 oz (84.006 kg)     Past Medical History  Diagnosis Date  . Hyperlipidemia     Crestor 3 x wk and Zetia daily  . Hx of cardiac cath   . Coronary atherosclerosis of native coronary artery     2009 LAD CIRC DES  . Chronic kidney disease     stage 111  . PONV (postoperative nausea and vomiting)     pt states extremely sick  . Hypertension     takes Hyzaar daily  . Shortness of breath     with exertion  . Headache(784.0)   . Dizziness   . Joint pain   . History of gout     doesn't require meds  . Cancer (HCC)     nose/Dr. Albertini;skin  . H/O hiatal hernia   . GERD (gastroesophageal reflux disease)     TUMS prn  . Chronic constipation   . Enlarged prostate     but doesn't require meds at present  . Diabetes mellitus     takes Januvia daily  . Anxiety   . Depression    doesn't take any meds for this  . Insomnia     doesn't require meds at present time  . Onychomycosis 10/05/2012    x 10  . Bunion 09/29/2011    Past Surgical History  Procedure Laterality Date  . Cholecystectomy    . Eye surgery      bilateral cataracts  . Hemorrhoid surgery    . Rotator cuff surgery      right   . Hernia repair      x 2;inguinal  . Cyst removed from finger      right hand  . Cataracts removed    . Skin cancer removed    . Cardiac catheterization  2013  . Coronary angioplasty      2 stents from 2009  . Colonoscopy    . Coronary artery bypass graft  10/29/2011    Procedure: CORONARY ARTERY BYPASS GRAFTING (CABG);  Surgeon: Kerin Perna, MD;  Location: [facility] OR;  Service: Open Heart Surgery;  Laterality: N/A;  . Left heart catheterization with coronary angiogram N/A 10/21/2011    Procedure: LEFT HEART CATHETERIZATION WITH CORONARY ANGIOGRAM;  Surgeon: Donato Schultz, MD;  Location: [facility] CATH LAB;  Service: Cardiovascular;  Laterality: N/A;  . Kidney stone surgery    . Bladder cancer      Current Outpatient Prescriptions  Medication Sig Dispense Refill  . aspirin 325 MG tablet Take 325 mg by mouth daily.    Marland Kitchen atorvastatin (LIPITOR) 80 MG tablet Take 80 mg by mouth every other day.     . calcium carbonate (TUMS - DOSED IN MG ELEMENTAL CALCIUM) 500 MG chewable tablet Chew 2 tablets by mouth daily as needed. For indigestion.     . Coenzyme Q10-Vitamin E 100-1 MG-UNT/5ML SYRP Take 100 mg by mouth daily. Take 2 tablespoons (100MG ) Daily    . fish oil-omega-3 fatty acids 1000 MG capsule Take 1,200 mg by mouth daily. 4 1200 tablets daily    . fluticasone (FLONASE) 50 MCG/ACT nasal spray Place 1 spray into both nostrils daily as needed for allergies or rhinitis.    Marland Kitchen losartan-hydrochlorothiazide (HYZAAR) 100-25 MG tablet TAKE 1 TABLET BY MOUTH DAILY 90 tablet 3  . metFORMIN (GLUCOPHAGE-XR) 500 MG 24 hr tablet TAKE 1 TABLET BY MOUTH TWICE DAILY 180 tablet 3  . metoprolol  tartrate (LOPRESSOR) 25 MG tablet TAKE 1 TABLET BY MOUTH TWICE DAILY 180 tablet 2   No current facility-administered medications for this visit.    Allergies:    Allergies  Allergen Reactions  . Crestor [Rosuvastatin]     Nightmares in high dosage  . Zocor [Simvastatin]     Nightmares in high dosage    Social History:  The patient  reports that he quit smoking about 31 years ago. His smoking use included Cigarettes. He has a 45 pack-year smoking history. He has never used smokeless tobacco. He reports that he does not drink alcohol or use illicit drugs.   ROS:  Please see the history of present illness.   Mild HA at times. Chest soreness. No CVA. Positive waking. Trouble with dyspnea when bending over.  PHYSICAL EXAM: VS:  BP 138/72 mmHg  Pulse 60  Ht 5\' 6"  (1.676 m)  Wt 188 lb 6.4 oz (85.458 kg)  BMI 30.42 kg/m2 Well nourished, well developed, in no acute distress HEENT: normal Neck: no JVD Cardiac:  normal S1, S2; RRR; no murmur Lungs:  clear to auscultation bilaterally, no wheezing, rhonchi or rales Abd: soft, nontender, no hepatomegalyoverweight. Protuberant Ext: no edema Skin: warm and dry Neuro: no focal abnormalities noted  EKG: 08/21/13 - Sinus bradycardia, first degree AV block, PR interval 298 ms otherwise unremarkable.  ASSESSMENT AND PLAN:  1. Hyperlipidemia- Atorvastatin 80 mg perhaps twice a week/every other day.  Co-Q 10 suggested.  He actually has had very good success from LDL down to 55 on high-dose atorvastatin. Dr. Zachery Dauer recently performed lab work. LDL October 2016 was 72. Now 55. Excellent 2. First degree AVB - . Stable. No syncope. No need for pacemaker right now. 3. Occasional palpitations-continuing with low-dose metoprolol. I am aware of his first-degree AV block. If this worsens, we will need to stop this medication. We discussed the possibility of pacemaker in the future.  4. Hypertension-good overall control.  5. CAD-stable, post bypass. No  current typical anginal symptoms.  6. Obesity-continue to encourage weight loss. 7. Bladder cancer-Dr. Earlene Plater. Post chemotherapy. Found after kidney stone. He is monitoring this every 3 months. 8. Vertigo-if this continues, please adjust with Dr. Zachery Dauer. 9. Six-month follow-up  Signed, Donato Schultz, MD [facility]  08/12/2015 9:10 AM

## 2015-08-12 NOTE — Patient Instructions (Signed)

## 2015-08-14 ENCOUNTER — Encounter: Payer: Self-pay | Admitting: Podiatry

## 2015-08-14 ENCOUNTER — Ambulatory Visit (INDEPENDENT_AMBULATORY_CARE_PROVIDER_SITE_OTHER): Payer: Medicare Other | Admitting: Podiatry

## 2015-08-14 DIAGNOSIS — M79676 Pain in unspecified toe(s): Secondary | ICD-10-CM | POA: Diagnosis not present

## 2015-08-14 DIAGNOSIS — B351 Tinea unguium: Secondary | ICD-10-CM | POA: Diagnosis not present

## 2015-08-14 NOTE — Patient Instructions (Signed)
Removed Band-Aid on fifth right toe 1-3 days and continue apply topical antibiotic ointment daily with a Band-Aid until a scab forms  Diabetes and Foot Care Diabetes may cause you to have problems because of poor blood supply (circulation) to your feet and legs. This may cause the skin on your feet to become thinner, break easier, and heal more slowly. Your skin may become dry, and the skin may peel and crack. You may also have nerve damage in your legs and feet causing decreased feeling in them. You may not notice minor injuries to your feet that could lead to infections or more serious problems. Taking care of your feet is one of the most important things you can do for yourself.  HOME CARE INSTRUCTIONS  Wear shoes at all times, even in the house. Do not go barefoot. Bare feet are easily injured.  Check your feet daily for blisters, cuts, and redness. If you cannot see the bottom of your feet, use a mirror or ask someone for help.  Wash your feet with warm water (do not use hot water) and mild soap. Then pat your feet and the areas between your toes until they are completely dry. Do not soak your feet as this can dry your skin.  Apply a moisturizing lotion or petroleum jelly (that does not contain alcohol and is unscented) to the skin on your feet and to dry, brittle toenails. Do not apply lotion between your toes.  Trim your toenails straight across. Do not dig under them or around the cuticle. File the edges of your nails with an emery board or nail file.  Do not cut corns or calluses or try to remove them with medicine.  Wear clean socks or stockings every day. Make sure they are not too tight. Do not wear knee-high stockings since they may decrease blood flow to your legs.  Wear shoes that fit properly and have enough cushioning. To break in new shoes, wear them for just a few hours a day. This prevents you from injuring your feet. Always look in your shoes before you put them on to be sure  there are no objects inside.  Do not cross your legs. This may decrease the blood flow to your feet.  If you find a minor scrape, cut, or break in the skin on your feet, keep it and the skin around it clean and dry. These areas may be cleansed with mild soap and water. Do not cleanse the area with peroxide, alcohol, or iodine.  When you remove an adhesive bandage, be sure not to damage the skin around it.  If you have a wound, look at it several times a day to make sure it is healing.  Do not use heating pads or hot water bottles. They may burn your skin. If you have lost feeling in your feet or legs, you may not know it is happening until it is too late.  Make sure your health care provider performs a complete foot exam at least annually or more often if you have foot problems. Report any cuts, sores, or bruises to your health care provider immediately. SEEK MEDICAL CARE IF:   You have an injury that is not healing.  You have cuts or breaks in the skin.  You have an ingrown nail.  You notice redness on your legs or feet.  You feel burning or tingling in your legs or feet.  You have pain or cramps in your legs and feet.  Your legs or feet are numb.  Your feet always feel cold. SEEK IMMEDIATE MEDICAL CARE IF:   There is increasing redness, swelling, or pain in or around a wound.  There is a red line that goes up your leg.  Pus is coming from a wound.  You develop a fever or as directed by your health care provider.  You notice a bad smell coming from an ulcer or wound.   This information is not intended to replace advice given to you by your health care provider. Make sure you discuss any questions you have with your health care provider.   Document Released: 02/14/2000 Document Revised: 10/19/2012 Document Reviewed: 07/26/2012 Elsevier Interactive Patient Education [employer].

## 2015-08-14 NOTE — Progress Notes (Signed)
Patient ID: Cody Thomas, male   DOB: [birth year], 82 y.o.   MRN: [id number]  Subjective: This patient presents again for schedule visit complaining of painful toenails walking wearing shoes and request toenail debridement  Objective: Orientated 3 No skin lesions bilaterally The toenails are elongated, brittle, hypertrophic, incurvated, discolored and tender to direct palpation 6-10  Assessment: Symptomatic onychomycoses 6-10 Type II diabetic  Plan: Debrided toenails 10 mechanically and electrically. Slight bleeding distal fifth right toe dressed with antibiotic ointment and Band-Aid. Patient advised to continue applying topical antibiotic ointment and Band-Aid to this area for removal in 1-3 days daily until a scab forms  Reappoint 3 months

## 2015-08-15 ENCOUNTER — Ambulatory Visit
Admission: RE | Admit: 2015-08-15 | Discharge: 2015-08-15 | Disposition: A | Discharge status: Home / Self Care | Payer: Medicare Other | Source: Ambulatory Visit | Attending: Gastroenterology | Admitting: Gastroenterology

## 2015-08-15 DIAGNOSIS — R1310 Dysphagia, unspecified: Secondary | ICD-10-CM

## 2015-08-15 DIAGNOSIS — K224 Dyskinesia of esophagus: Secondary | ICD-10-CM | POA: Diagnosis not present

## 2015-09-02 DIAGNOSIS — C672 Malignant neoplasm of lateral wall of bladder: Secondary | ICD-10-CM | POA: Diagnosis not present

## 2015-09-10 DIAGNOSIS — C672 Malignant neoplasm of lateral wall of bladder: Secondary | ICD-10-CM | POA: Diagnosis not present

## 2015-09-17 DIAGNOSIS — C672 Malignant neoplasm of lateral wall of bladder: Secondary | ICD-10-CM | POA: Diagnosis not present

## 2015-09-24 DIAGNOSIS — C672 Malignant neoplasm of lateral wall of bladder: Secondary | ICD-10-CM | POA: Diagnosis not present

## 2015-10-03 ENCOUNTER — Ambulatory Visit (INDEPENDENT_AMBULATORY_CARE_PROVIDER_SITE_OTHER)
Admission: RE | Admit: 2015-10-03 | Discharge: 2015-10-03 | Disposition: A | Discharge status: Home / Self Care | Payer: Medicare Other | Source: Ambulatory Visit | Attending: Pulmonary Disease | Admitting: Pulmonary Disease

## 2015-10-03 DIAGNOSIS — R918 Other nonspecific abnormal finding of lung field: Secondary | ICD-10-CM | POA: Diagnosis not present

## 2015-10-04 ENCOUNTER — Telehealth: Payer: Self-pay | Admitting: Pulmonary Disease

## 2015-10-04 NOTE — Progress Notes (Signed)
Pt notified of results

## 2015-10-04 NOTE — Telephone Encounter (Signed)
yes

## 2015-10-04 NOTE — Telephone Encounter (Signed)
Spoke with the pt and notified that he needs to keep ov  He verbalized understanding and nothing further needed

## 2015-10-04 NOTE — Telephone Encounter (Signed)
Notes Recorded by Juanito Doom, MD on 10/03/2015 at 10:37 PM EDT A, Please let the patient know this was OK, nodules all stable Thanks, B  Spoke with pt and notified of results per Dr. Lake Bells. Pt verbalized understanding. He states has an appt with Dr Lake Bells on 10/24/15 and wants to know if he needs to keep this appt. Please advise, thanks!

## 2015-10-22 ENCOUNTER — Ambulatory Visit: Payer: Medicare Other | Admitting: Pulmonary Disease

## 2015-10-22 ENCOUNTER — Other Ambulatory Visit: Payer: Self-pay | Admitting: Endocrinology

## 2015-10-24 ENCOUNTER — Ambulatory Visit (INDEPENDENT_AMBULATORY_CARE_PROVIDER_SITE_OTHER): Payer: Medicare Other | Admitting: Pulmonary Disease

## 2015-10-24 ENCOUNTER — Encounter: Payer: Self-pay | Admitting: Pulmonary Disease

## 2015-10-24 DIAGNOSIS — R918 Other nonspecific abnormal finding of lung field: Secondary | ICD-10-CM

## 2015-10-24 NOTE — Patient Instructions (Addendum)
It is nice to meet you today. Your CT scan of the chest indicated that your pulmonary nodules are stable. We will plan to repeat the CT Chest in 12 months. Add Caritin for seasonal allergies. Make sure you get your flu shot this fall. Try to work on weight loss as this may help with your shortness of breath. Follow up with Dr. Lake Bells in 12 months.with CT chest prior. Please contact office for sooner follow up if symptoms do not improve or worsen or seek emergency care

## 2015-10-24 NOTE — Assessment & Plan Note (Signed)
CT Chest with stable pulmonary nodules Some shortness of breath with exertion Appears deconditioned/ obese. Plan: Your CT scan of the chest indicated that your pulmonary nodules are stable. We will plan to repeat the CT Chest in 12 months. Add Caritin for seasonal allergies. Make sure you get your flu shot this fall. Try to work on weight loss as this may help with your shortness of breath. Follow up with Dr. Lake Bells in 12 months.with CT chest prior. Please contact office for sooner follow up if symptoms do not improve or worsen or seek emergency care

## 2015-10-24 NOTE — Addendum Note (Signed)
Addended by: Len Blalock on: 10/24/2015 03:28 PM   Modules accepted: Orders

## 2015-10-24 NOTE — Progress Notes (Signed)
History of Present Illness Cody Thomas is a 82 y.o. male former smoker ( quit 1986)  with pulmonary nodules followed by Dr. Lake Bells. He has a history of bladder cancer in  2016.   10/24/2015 Follow up Appointment Pt. Presents to the office for follow up. He states that he has been doing well.He has been feeling well, and has some dyspnea with exercise and walking up hills. He has a rare dry cough. He does have some seasonal allergies.He has not had any weight loss, hemoptysis or orthopnea.He states his anxiety is better. He is no longer on any medication for it on a regular basis.  Tests 10/03/2015 CT Chest  Scattered small bilateral pulmonary nodules measuring up to 5 x 6 mm in the left lower lobe.  Dominant nodule demonstrates 18 month stability. Remaining nodules demonstrate at least 12 month stability. As such, these findings are not considered suspicious for metastatic disease or primary bronchogenic neoplasm. Recommend repeat imaging in 12 months per Fleischner Society guidelines   Past medical hx Past Medical History:  Diagnosis Date  . Anxiety   . Bunion 09/29/2011  . Cancer (HCC)    nose/Dr. Albertini;skin  . Chronic constipation   . Chronic kidney disease    stage 111  . Coronary atherosclerosis of native coronary artery    2009 LAD CIRC DES  . Depression    doesn't take any meds for this  . Diabetes mellitus    takes Januvia daily  . Dizziness   . Enlarged prostate    but doesn't require meds at present  . GERD (gastroesophageal reflux disease)    TUMS prn  . H/O hiatal hernia   . Headache(784.0)   . History of gout    doesn't require meds  . Hx of cardiac cath   . Hyperlipidemia    Crestor 3 x wk and Zetia daily  . Hypertension    takes Hyzaar daily  . Insomnia    doesn't require meds at present time  . Joint pain   . Onychomycosis 10/05/2012   x 10  . PONV (postoperative nausea and vomiting)    pt states extremely sick  . Shortness of  breath    with exertion     Past surgical hx, Family hx, Social hx all reviewed.  Current Outpatient Prescriptions on File Prior to Visit  Medication Sig  . aspirin 325 MG tablet Take 325 mg by mouth daily.  Marland Kitchen atorvastatin (LIPITOR) 80 MG tablet Take 80 mg by mouth every other day.   . calcium carbonate (TUMS - DOSED IN MG ELEMENTAL CALCIUM) 500 MG chewable tablet Chew 2 tablets by mouth daily as needed. For indigestion.   . Coenzyme Q10-Vitamin E 100-1 MG-UNT/5ML SYRP Take 100 mg by mouth daily. Take 2 tablespoons (100MG ) Daily  . fish oil-omega-3 fatty acids 1000 MG capsule Take 1,200 mg by mouth daily. 4 1200 tablets daily  . fluticasone (FLONASE) 50 MCG/ACT nasal spray Place 1 spray into both nostrils daily as needed for allergies or rhinitis.  Marland Kitchen losartan-hydrochlorothiazide (HYZAAR) 100-25 MG tablet TAKE 1 TABLET BY MOUTH DAILY  . metFORMIN (GLUCOPHAGE-XR) 500 MG 24 hr tablet TAKE 1 TABLET BY MOUTH TWICE DAILY  . metoprolol tartrate (LOPRESSOR) 25 MG tablet TAKE 1 TABLET BY MOUTH TWICE DAILY   No current facility-administered medications on file prior to visit.      Allergies  Allergen Reactions  . Crestor [Rosuvastatin]     Nightmares in high dosage  . Zocor [Simvastatin]  Nightmares in high dosage    Review Of Systems:  Constitutional:   No  weight loss, night sweats,  Fevers, chills, fatigue, or  lassitude.  HEENT:   No headaches,  Difficulty swallowing,  Tooth/dental problems, or  Sore throat,                No sneezing, itching, ear ache, nasal congestion, post nasal drip,   CV:  No chest pain,  Orthopnea, PND, swelling in lower extremities, anasarca, dizziness, palpitations, syncope.   GI  No heartburn, indigestion, abdominal pain, nausea, vomiting, diarrhea, change in bowel habits, loss of appetite, bloody stools.   Resp: + shortness of breath with exertion not  at rest.  No excess mucus, no productive cough,  + non-productive cough,  No coughing up of blood.   No change in color of mucus.  No wheezing.  No chest wall deformity  Skin: no rash or lesions.  GU: no dysuria, change in color of urine, no urgency or frequency.  No flank pain, no hematuria   MS:  No joint pain or swelling.  No decreased range of motion.  No back pain.  Psych:  No change in mood or affect. No depression or anxiety.  No memory loss.   Vital Signs BP 130/78 (BP Location: Left Arm, Cuff Size: Normal)   Pulse 64   Ht 5\' 4"  (1.626 m)   Wt 190 lb 6.4 oz (86.4 kg)   SpO2 91%   BMI 32.68 kg/m    Physical Exam:  General- No distress,  A&Ox3, obese, deconditioned ENT: No sinus tenderness, TM clear, pale nasal mucosa, no oral exudate,no post nasal drip, no LAN Cardiac: S1, S2, regular rate and rhythm, no murmur Chest: No wheeze/ rales/ dullness; no accessory muscle use, no nasal flaring, no sternal retractions Abd.: Soft Non-tender Ext: No clubbing cyanosis, edema Neuro:  normal strength Skin: No rashes, warm and dry Psych: normal mood and behavior   Assessment/Plan  Pulmonary nodules/lesions, multiple CT Chest with stable pulmonary nodules Some shortness of breath with exertion Appears deconditioned/ obese. Plan: Your CT scan of the chest indicated that your pulmonary nodules are stable. We will plan to repeat the CT Chest in 12 months. Add Caritin for seasonal allergies. Make sure you get your flu shot this fall. Try to work on weight loss as this may help with your shortness of breath. Follow up with Dr. Lake Bells in 12 months.with CT chest prior. Please contact office for sooner follow up if symptoms do not improve or worsen or seek emergency care      Magdalen Spatz, NP 10/24/2015  3:08 PM

## 2015-10-24 NOTE — Progress Notes (Signed)
Attending:  I have seen and examined the patient with Eric Form and I agree that findings from her note  No significant changes  On exam: Lungs clear to auscultation Cardiac vascular regular rate and rhythm no murmurs doubt scrubs  Pulmonary nodule: Stable, repeat CT chest one year  Agree with the rest as above  Roselie Awkward, MD [facility] Pager: [phone] Cell: [phone] After 3pm or if no response, call [phone]

## 2015-10-30 DIAGNOSIS — L570 Actinic keratosis: Secondary | ICD-10-CM | POA: Diagnosis not present

## 2015-10-30 DIAGNOSIS — L821 Other seborrheic keratosis: Secondary | ICD-10-CM | POA: Diagnosis not present

## 2015-10-30 DIAGNOSIS — L814 Other melanin hyperpigmentation: Secondary | ICD-10-CM | POA: Diagnosis not present

## 2015-10-30 DIAGNOSIS — C44612 Basal cell carcinoma of skin of right upper limb, including shoulder: Secondary | ICD-10-CM | POA: Diagnosis not present

## 2015-10-30 DIAGNOSIS — Z85828 Personal history of other malignant neoplasm of skin: Secondary | ICD-10-CM | POA: Diagnosis not present

## 2015-10-30 DIAGNOSIS — D485 Neoplasm of uncertain behavior of skin: Secondary | ICD-10-CM | POA: Diagnosis not present

## 2015-10-30 DIAGNOSIS — L218 Other seborrheic dermatitis: Secondary | ICD-10-CM | POA: Diagnosis not present

## 2015-10-31 ENCOUNTER — Other Ambulatory Visit (INDEPENDENT_AMBULATORY_CARE_PROVIDER_SITE_OTHER): Payer: Medicare Other

## 2015-10-31 DIAGNOSIS — E119 Type 2 diabetes mellitus without complications: Secondary | ICD-10-CM

## 2015-10-31 LAB — BASIC METABOLIC PANEL
BUN: 19 mg/dL (ref 6–23)
CO2: 30 mEq/L (ref 19–32)
Calcium: 9.8 mg/dL (ref 8.4–10.5)
Chloride: 100 meq/L (ref 96–112)
Creatinine, Ser: 1.34 mg/dL (ref 0.40–1.50)
GFR: 54.1 mL/min — ABNORMAL LOW (ref >60.00)
Glucose, Bld: 125 mg/dL — ABNORMAL HIGH (ref 70–99)
Potassium: 4 meq/L (ref 3.5–5.1)
Sodium: 137 mEq/L (ref 135–145)

## 2015-10-31 LAB — HEMOGLOBIN A1C: Hgb A1c MFr Bld: 6.6 % — ABNORMAL HIGH (ref 4.6–6.5)

## 2015-11-04 DIAGNOSIS — E119 Type 2 diabetes mellitus without complications: Secondary | ICD-10-CM | POA: Diagnosis not present

## 2015-11-06 ENCOUNTER — Ambulatory Visit (INDEPENDENT_AMBULATORY_CARE_PROVIDER_SITE_OTHER): Payer: Medicare Other | Admitting: Endocrinology

## 2015-11-06 ENCOUNTER — Encounter: Payer: Self-pay | Admitting: Endocrinology

## 2015-11-06 VITALS — BP 148/76 | HR 64 | Ht 65.0 in | Wt 188.0 lb

## 2015-11-06 DIAGNOSIS — E041 Nontoxic single thyroid nodule: Secondary | ICD-10-CM

## 2015-11-06 DIAGNOSIS — E1165 Type 2 diabetes mellitus with hyperglycemia: Secondary | ICD-10-CM | POA: Diagnosis not present

## 2015-11-06 NOTE — Progress Notes (Signed)
Patient ID: Cody Thomas, male   DOB: [birth year], 82 y.o.   MRN: [id number]   Reason for Appointment: Diabetes follow-up   History of Present Illness   Diagnosis: Type 2 DIABETES MELITUS  He has had mild diabetes which has been previously erratic partly because of dietary inconsistency and difficulty with weight loss He has not been continued on metformin because of renal dysfunction Was given low-dose Januvia instead but this was too expensive for him even though it was helping his control In 2014 he was given low dose Amaryl instead which appears to be keeping his blood sugars controlled  His blood sugars were not well controlled when he was seen in 1/16 with A1c 7.8  Recent history:  Since 1/16 he has been on metformin and he is  taking 1000 mg daily without any side effects. Also his Amaryl was stopped previously because of occasional mild hypoglycemia . A1c is now again below 7% but relatively higher at 6.6, previously was excellent at 6.2 and stable  He does have periodic high post prandial readings based on his diet.  He is eating out at restaurants frequently Also sometimes will get into more desserts Recently not exercising but weight is stable            Monitors blood glucose: Once a day or less .    Glucometer:   Accu-Chek         Blood Glucose readings from meter download:  Mean values apply above for all meters except median for One Touch  PRE-MEAL Fasting Lunch Dinner Bedtime Overall  Glucose range: 105-147    150-215    Mean/median: 120    180  150   POST-MEAL PC Breakfast PC Lunch PC Dinner  Glucose range: 221     Mean/median:  162      Dietitian consultation last done several years ago Physical activity: exercise: None recently because of sciatica  Wt Readings from Last 3 Encounters:  11/06/15 188 lb (85.3 kg)  10/24/15 190 lb 6.4 oz (86.4 kg)  08/12/15 188 lb 6.4 oz (85.5 kg)   DM LABS:  Lab Results  Component Value Date   HGBA1C  6.6 (H) 10/31/2015   HGBA1C 6.2 06/17/2015   HGBA1C 6.1 03/12/2015   Lab Results  Component Value Date   MICROALBUR 13.4 (H) 03/12/2015   LDLCALC 72 12/07/2014   CREATININE 1.34 10/31/2015   Lab on 10/31/2015  Component Date Value Ref Range Status  . Hgb A1c MFr Bld 10/31/2015 6.6* 4.6 - 6.5 % Final  . Sodium 10/31/2015 137  135 - 145 mEq/L Final  . Potassium 10/31/2015 4.0  3.5 - 5.1 mEq/L Final  . Chloride 10/31/2015 100  96 - 112 mEq/L Final  . CO2 10/31/2015 30  19 - 32 mEq/L Final  . Glucose, Bld 10/31/2015 125* 70 - 99 mg/dL Final  . BUN 10/31/2015 19  6 - 23 mg/dL Final  . Creatinine, Ser 10/31/2015 1.34  0.40 - 1.50 mg/dL Final  . Calcium 10/31/2015 9.8  8.4 - 10.5 mg/dL Final  . GFR 10/31/2015 54.10* >60.00 mL/min Final       Medication List       Accurate as of 11/06/15  9:52 AM. Always use your most recent med list.          aspirin 325 MG tablet Take 325 mg by mouth daily.   atorvastatin 80 MG tablet Commonly known as:  LIPITOR Take 80 mg by mouth every other  day.   calcium carbonate 500 MG chewable tablet Commonly known as:  TUMS - dosed in mg elemental calcium Chew 2 tablets by mouth daily as needed. For indigestion.   Coenzyme Q10-Vitamin E 100-1 MG-UNT/5ML Syrp Take 100 mg by mouth daily. Take 2 tablespoons (100MG ) Daily   fish oil-omega-3 fatty acids 1000 MG capsule Take 1,200 mg by mouth daily. 4 1200 tablets daily   fluticasone 50 MCG/ACT nasal spray Commonly known as:  FLONASE Place 1 spray into both nostrils daily as needed for allergies or rhinitis.   losartan-hydrochlorothiazide 100-25 MG tablet Commonly known as:  HYZAAR TAKE 1 TABLET BY MOUTH DAILY   metFORMIN 500 MG 24 hr tablet Commonly known as:  GLUCOPHAGE-XR TAKE 1 TABLET BY MOUTH TWICE DAILY   metoprolol tartrate 25 MG tablet Commonly known as:  LOPRESSOR TAKE 1 TABLET BY MOUTH TWICE DAILY       Allergies:  Allergies  Allergen Reactions  . Crestor [Rosuvastatin]      Nightmares in high dosage  . Zocor [Simvastatin]     Nightmares in high dosage    Past Medical History:  Diagnosis Date  . Anxiety   . Bunion 09/29/2011  . Cancer (HCC)    nose/Dr. Albertini;skin  . Chronic constipation   . Chronic kidney disease    stage 111  . Coronary atherosclerosis of native coronary artery    2009 LAD CIRC DES  . Depression    doesn't take any meds for this  . Diabetes mellitus    takes Januvia daily  . Dizziness   . Enlarged prostate    but doesn't require meds at present  . GERD (gastroesophageal reflux disease)    TUMS prn  . H/O hiatal hernia   . Headache(784.0)   . History of gout    doesn't require meds  . Hx of cardiac cath   . Hyperlipidemia    Crestor 3 x wk and Zetia daily  . Hypertension    takes Hyzaar daily  . Insomnia    doesn't require meds at present time  . Joint pain   . Onychomycosis 10/05/2012   x 10  . PONV (postoperative nausea and vomiting)    pt states extremely sick  . Shortness of breath    with exertion    Past Surgical History:  Procedure Laterality Date  . bladder cancer    . CARDIAC CATHETERIZATION  2013  . cataracts removed    . CHOLECYSTECTOMY    . COLONOSCOPY    . CORONARY ANGIOPLASTY     2 stents from 2009  . CORONARY ARTERY BYPASS GRAFT  10/29/2011   Procedure: CORONARY ARTERY BYPASS GRAFTING (CABG);  Surgeon: Ivin Poot, MD;  Location: [facility];  Service: Open Heart Surgery;  Laterality: N/A;  . cyst removed from finger     right hand  . EYE SURGERY     bilateral cataracts  . HEMORRHOID SURGERY    . HERNIA REPAIR     x 2;inguinal  . KIDNEY STONE SURGERY    . LEFT HEART CATHETERIZATION WITH CORONARY ANGIOGRAM N/A 10/21/2011   Procedure: LEFT HEART CATHETERIZATION WITH CORONARY ANGIOGRAM;  Surgeon: Candee Furbish, MD;  Location: [facility] CATH LAB;  Service: Cardiovascular;  Laterality: N/A;  . rotator cuff surgery     right   . skin cancer removed      Family History  Problem Relation Age of Onset   . Stroke Father   . Cancer Brother     Social History:  reports that he quit smoking about 31 years ago. His smoking use included Cigarettes. He has a 45.00 pack-year smoking history. He has never used smokeless tobacco. He reports that he does not drink alcohol or use drugs.  Review of Systems:  HYPERTENSION:  fairly well-controlled on losartan HCT  RENAL dysfunction: This is Stable and mild  Lab Results  Component Value Date   CREATININE 1.34 10/31/2015   BUN 19 10/31/2015   NA 137 10/31/2015   K 4.0 10/31/2015   CL 100 10/31/2015   CO2 30 10/31/2015     HYPERLIPIDEMIA: The lipid abnormality consists of elevated LDL and low HDL treated with Lipitor   Given 80 mg Lipitor dose by cardiologist, this causes nightmares and is taking only 40 mg 2 or 3 times a week, muscle aches are better with using CoQ10   Last LDL done by PCP was 55 in 5/17   Lab Results  Component Value Date   CHOL 144 12/07/2014   HDL 32.50 (L) 12/07/2014   LDLCALC 72 12/07/2014   TRIG 195.0 (H) 12/07/2014   CHOLHDL 4 12/07/2014    Thyroid nodule: The nodule was same or slightly smaller on his follow-up ultrasound exam     Examination:   BP (!) 161/85   Pulse 64   Ht 5\' 5"  (1.651 m)   Wt 188 lb (85.3 kg)   BMI 31.28 kg/m   Body mass index is 31.28 kg/m.  .   Diabetic Foot Exam - Simple   Simple Foot Form Diabetic Foot exam was performed with the following findings:  Yes 11/06/2015  8:57 AM  Visual Inspection No deformities, no ulcerations, no other skin breakdown bilaterally:  Yes Sensation Testing Intact to touch and monofilament testing bilaterally:  Yes Pulse Check Posterior Tibialis and Dorsalis pulse intact bilaterally:  Yes See comments:  Yes Comments Reduced dorsalis pedis only      ASSESSMENT/ PLAN:   Diabetes type 2 with  obesity  The patient's diabetes control is Reasonably good for his age with A1c below 7 He is taking metformin only He does have some post prandial  hyperglycemia but only when he goes off his diet and fasting readings are fairly good  Discussed watching portions and carbohydrates as well as high-fat meals and desserts more consistently  Since his kidney function is reasonably good and he is benefiting from metformin he will continue 1000 mg daily  Follow-up in 4 months    Patient Instructions  Check blood sugars on waking up    Also check blood sugars about 2 hours after a meal and do this after different meals by rotation  Recommended blood sugar levels on waking up is 90-130 and about 2 hours after meal is 130-160  Please bring your blood sugar monitor to each visit, thank you      [facility] 11/06/2015, 9:52 AM

## 2015-11-06 NOTE — Patient Instructions (Signed)
Check blood sugars on waking up    Also check blood sugars about 2 hours after a meal and do this after different meals by rotation  Recommended blood sugar levels on waking up is 90-130 and about 2 hours after meal is 130-160  Please bring your blood sugar monitor to each visit, thank you  

## 2015-11-07 ENCOUNTER — Ambulatory Visit: Payer: Medicare Other | Admitting: Endocrinology

## 2015-11-20 ENCOUNTER — Ambulatory Visit: Payer: Medicare Other | Admitting: Podiatry

## 2015-11-26 ENCOUNTER — Ambulatory Visit (INDEPENDENT_AMBULATORY_CARE_PROVIDER_SITE_OTHER): Payer: Medicare Other | Admitting: Podiatry

## 2015-11-26 ENCOUNTER — Encounter: Payer: Self-pay | Admitting: Podiatry

## 2015-11-26 VITALS — BP 195/98 | HR 65 | Resp 14

## 2015-11-26 DIAGNOSIS — B351 Tinea unguium: Secondary | ICD-10-CM

## 2015-11-26 DIAGNOSIS — M79676 Pain in unspecified toe(s): Secondary | ICD-10-CM

## 2015-11-26 NOTE — Patient Instructions (Signed)
Diabetes and Foot Care Diabetes may cause you to have problems because of poor blood supply (circulation) to your feet and legs. This may cause the skin on your feet to become thinner, break easier, and heal more slowly. Your skin may become dry, and the skin may peel and crack. You may also have nerve damage in your legs and feet causing decreased feeling in them. You may not notice minor injuries to your feet that could lead to infections or more serious problems. Taking care of your feet is one of the most important things you can do for yourself.  HOME CARE INSTRUCTIONS  Wear shoes at all times, even in the house. Do not go barefoot. Bare feet are easily injured.  Check your feet daily for blisters, cuts, and redness. If you cannot see the bottom of your feet, use a mirror or ask someone for help.  Wash your feet with warm water (do not use hot water) and mild soap. Then pat your feet and the areas between your toes until they are completely dry. Do not soak your feet as this can dry your skin.  Apply a moisturizing lotion or petroleum jelly (that does not contain alcohol and is unscented) to the skin on your feet and to dry, brittle toenails. Do not apply lotion between your toes.  Trim your toenails straight across. Do not dig under them or around the cuticle. File the edges of your nails with an emery board or nail file.  Do not cut corns or calluses or try to remove them with medicine.  Wear clean socks or stockings every day. Make sure they are not too tight. Do not wear knee-high stockings since they may decrease blood flow to your legs.  Wear shoes that fit properly and have enough cushioning. To break in new shoes, wear them for just a few hours a day. This prevents you from injuring your feet. Always look in your shoes before you put them on to be sure there are no objects inside.  Do not cross your legs. This may decrease the blood flow to your feet.  If you find a minor scrape,  cut, or break in the skin on your feet, keep it and the skin around it clean and dry. These areas may be cleansed with mild soap and water. Do not cleanse the area with peroxide, alcohol, or iodine.  When you remove an adhesive bandage, be sure not to damage the skin around it.  If you have a wound, look at it several times a day to make sure it is healing.  Do not use heating pads or hot water bottles. They may burn your skin. If you have lost feeling in your feet or legs, you may not know it is happening until it is too late.  Make sure your health care provider performs a complete foot exam at least annually or more often if you have foot problems. Report any cuts, sores, or bruises to your health care provider immediately. SEEK MEDICAL CARE IF:   You have an injury that is not healing.  You have cuts or breaks in the skin.  You have an ingrown nail.  You notice redness on your legs or feet.  You feel burning or tingling in your legs or feet.  You have pain or cramps in your legs and feet.  Your legs or feet are numb.  Your feet always feel cold. SEEK IMMEDIATE MEDICAL CARE IF:   There is increasing redness,   swelling, or pain in or around a wound.  There is a red line that goes up your leg.  Pus is coming from a wound.  You develop a fever or as directed by your health care provider.  You notice a bad smell coming from an ulcer or wound.   This information is not intended to replace advice given to you by your health care provider. Make sure you discuss any questions you have with your health care provider.   Document Released: 02/14/2000 Document Revised: 10/19/2012 Document Reviewed: 07/26/2012 Elsevier Interactive Patient Education 2016 [employer]  

## 2015-11-26 NOTE — Progress Notes (Signed)
Patient ID: Cody Thomas, male   DOB: 1930/03/31, 82 y.o.   MRN: [id number]    Subjective: This patient presents again for schedule visit complaining of painful toenails walking wearing shoes and request toenail debridement  Objective: Orientated 3 DP and PT pulses 2/4 bilaterally Capillary reflex immediate bilaterally Sensation to 10 g monofilament wire intact 5/5 bilaterally Vibratory sensation reactive bilaterally Ankle reflexes equal and reactive bilaterally No skin lesions bilaterally The toenails are elongated, brittle, hypertrophic, incurvated, discolored and tender to direct palpation 6-10  Assessment: Symptomatic onychomycoses 6-10 Type II diabetic  Plan: Debrided toenails 10 mechanically and electrically without any bleeding  Reappoint 3 months

## 2015-12-09 DIAGNOSIS — N200 Calculus of kidney: Secondary | ICD-10-CM | POA: Diagnosis not present

## 2015-12-09 DIAGNOSIS — C672 Malignant neoplasm of lateral wall of bladder: Secondary | ICD-10-CM | POA: Diagnosis not present

## 2015-12-17 DIAGNOSIS — C44612 Basal cell carcinoma of skin of right upper limb, including shoulder: Secondary | ICD-10-CM | POA: Diagnosis not present

## 2015-12-17 DIAGNOSIS — L570 Actinic keratosis: Secondary | ICD-10-CM | POA: Diagnosis not present

## 2015-12-17 DIAGNOSIS — L218 Other seborrheic dermatitis: Secondary | ICD-10-CM | POA: Diagnosis not present

## 2016-01-01 DIAGNOSIS — Z23 Encounter for immunization: Secondary | ICD-10-CM | POA: Diagnosis not present

## 2016-01-06 DIAGNOSIS — H52223 Regular astigmatism, bilateral: Secondary | ICD-10-CM | POA: Diagnosis not present

## 2016-01-06 DIAGNOSIS — E119 Type 2 diabetes mellitus without complications: Secondary | ICD-10-CM | POA: Diagnosis not present

## 2016-01-06 DIAGNOSIS — H5203 Hypermetropia, bilateral: Secondary | ICD-10-CM | POA: Diagnosis not present

## 2016-01-06 DIAGNOSIS — H524 Presbyopia: Secondary | ICD-10-CM | POA: Diagnosis not present

## 2016-01-20 ENCOUNTER — Encounter: Payer: Self-pay | Admitting: Cardiology

## 2016-01-20 DIAGNOSIS — R42 Dizziness and giddiness: Secondary | ICD-10-CM | POA: Diagnosis not present

## 2016-01-20 DIAGNOSIS — N183 Chronic kidney disease, stage 3 (moderate): Secondary | ICD-10-CM | POA: Diagnosis not present

## 2016-01-20 DIAGNOSIS — I2510 Atherosclerotic heart disease of native coronary artery without angina pectoris: Secondary | ICD-10-CM | POA: Diagnosis not present

## 2016-01-20 DIAGNOSIS — Z951 Presence of aortocoronary bypass graft: Secondary | ICD-10-CM | POA: Diagnosis not present

## 2016-01-20 DIAGNOSIS — E119 Type 2 diabetes mellitus without complications: Secondary | ICD-10-CM | POA: Diagnosis not present

## 2016-01-20 DIAGNOSIS — E7800 Pure hypercholesterolemia, unspecified: Secondary | ICD-10-CM | POA: Diagnosis not present

## 2016-01-20 DIAGNOSIS — E041 Nontoxic single thyroid nodule: Secondary | ICD-10-CM | POA: Diagnosis not present

## 2016-01-20 DIAGNOSIS — I10 Essential (primary) hypertension: Secondary | ICD-10-CM | POA: Diagnosis not present

## 2016-01-20 DIAGNOSIS — F339 Major depressive disorder, recurrent, unspecified: Secondary | ICD-10-CM | POA: Diagnosis not present

## 2016-01-20 DIAGNOSIS — J449 Chronic obstructive pulmonary disease, unspecified: Secondary | ICD-10-CM | POA: Diagnosis not present

## 2016-01-22 ENCOUNTER — Other Ambulatory Visit: Payer: Self-pay | Admitting: Family Medicine

## 2016-01-22 DIAGNOSIS — E041 Nontoxic single thyroid nodule: Secondary | ICD-10-CM

## 2016-02-03 ENCOUNTER — Ambulatory Visit
Admission: RE | Admit: 2016-02-03 | Discharge: 2016-02-03 | Disposition: A | Discharge status: Home / Self Care | Payer: Medicare Other | Source: Ambulatory Visit | Attending: Family Medicine | Admitting: Family Medicine

## 2016-02-03 DIAGNOSIS — E041 Nontoxic single thyroid nodule: Secondary | ICD-10-CM

## 2016-02-05 ENCOUNTER — Other Ambulatory Visit: Payer: Self-pay | Admitting: Endocrinology

## 2016-02-13 ENCOUNTER — Ambulatory Visit (INDEPENDENT_AMBULATORY_CARE_PROVIDER_SITE_OTHER): Payer: Medicare Other | Admitting: Orthopedic Surgery

## 2016-02-13 ENCOUNTER — Encounter (INDEPENDENT_AMBULATORY_CARE_PROVIDER_SITE_OTHER): Payer: Self-pay | Admitting: Orthopedic Surgery

## 2016-02-13 ENCOUNTER — Telehealth (INDEPENDENT_AMBULATORY_CARE_PROVIDER_SITE_OTHER): Payer: Self-pay | Admitting: Family

## 2016-02-13 ENCOUNTER — Ambulatory Visit (INDEPENDENT_AMBULATORY_CARE_PROVIDER_SITE_OTHER): Payer: Self-pay

## 2016-02-13 VITALS — Ht 65.0 in | Wt 188.0 lb

## 2016-02-13 DIAGNOSIS — M5136 Other intervertebral disc degeneration, lumbar region: Secondary | ICD-10-CM | POA: Diagnosis not present

## 2016-02-13 DIAGNOSIS — G8929 Other chronic pain: Secondary | ICD-10-CM | POA: Diagnosis not present

## 2016-02-13 DIAGNOSIS — M5442 Lumbago with sciatica, left side: Secondary | ICD-10-CM | POA: Diagnosis not present

## 2016-02-13 DIAGNOSIS — M51369 Other intervertebral disc degeneration, lumbar region without mention of lumbar back pain or lower extremity pain: Secondary | ICD-10-CM | POA: Insufficient documentation

## 2016-02-13 MED ORDER — TRAMADOL HCL 50 MG PO TABS: 50.0000 mg | ORAL_TABLET | Freq: Four times a day (QID) | ORAL | 0 refills | Status: DC | PRN

## 2016-02-13 MED ORDER — PREDNISONE 10 MG PO TABS
ORAL_TABLET | ORAL | 0 refills | Status: DC
Start: 2016-02-13 — End: 2016-05-12

## 2016-02-13 NOTE — Progress Notes (Signed)
Office Visit Note   Patient: Cody Thomas           Date of Birth: 04/06/1930           MRN: [id number] Visit Date: 02/13/2016              Requested by: Leighton Ruff, MD [place], [place] [address] PCP: Gerrit Heck, MD   Assessment & Plan: Visit Diagnoses:  1. Chronic left-sided low back pain with left-sided sciatica   2. Degenerative disc disease, lumbar     Plan: Have called in a prescription for prednisone as well as tramadol for pain. He will follow up in 3 more weeks should he have continued pain. May need to pursue an ESI lumbar spine.  Follow-Up Instructions: No Follow-up on file.   Orders:  Orders Placed This Encounter  Procedures  . XR Lumbar Spine 2-3 Views   Meds ordered this encounter  Medications  . predniSONE (DELTASONE) 10 MG tablet    Sig: 6 tablets x 2 days, 3 x 2days, then 4 x 2 days, then 3 x 2 days, then 2 x 2 days, then 1 x 2 days    Dispense:  42 tablet    Refill:  0  . traMADol (ULTRAM) 50 MG tablet    Sig: Take 1 tablet (50 mg total) by mouth every 6 (six) hours as needed.    Dispense:  30 tablet    Refill:  0      Procedures: No procedures performed   Clinical Data: No additional findings.   Subjective: Chief Complaint  Patient presents with  . Lower Back - Pain    Left sided hip pain    Patient is an 82 year old gentleman who is seen today for initial evaluation of left sided hip pain that radiates posteriorly to this foot. Is associated with intermittient numbness and tingling. This has been ongoing x 2 months ago. No known injury. Does relate that about 2 months ago he was straining during a bowel movement, wonders if this could have started things. He is not able to sleep on this side and is sitting leaning to the right to off load pressure on the left buttock. He had moderate relief of pain with tramadol that he had on hand.    Review of Systems  Constitutional: Negative for chills and  fever.     Objective: Vital Signs: Ht 5\' 5"  (1.651 m)   Wt 188 lb (85.3 kg)   BMI 31.28 kg/m   Physical Exam  Constitutional: He is oriented to person, place, and time. He appears well-developed and well-nourished.  Pulmonary/Chest: Effort normal.  Neurological: He is alert and oriented to person, place, and time.  Psychiatric: He has a normal mood and affect.  Nursing note reviewed.   Left Hip Exam  Left hip exam is normal.   Back Exam   Tenderness  The patient is experiencing no tenderness.   Tests  Straight leg raise right: negative Straight leg raise left: negative  Other  Gait: normal       Specialty Comments:  No specialty comments available.  Imaging: No results found.   PMFS History: Patient Active Problem List   Diagnosis Date Noted  . Left thyroid nodule 01/21/2015  . Pulmonary nodules/lesions, multiple 10/30/2014  . Bladder cancer ([facility]) 08/09/2014  . Type II or unspecified type diabetes mellitus without mention of complication, not stated as uncontrolled 10/20/2012  . Onychomycosis 10/05/2012  . Chest pain,  unspecified 10/21/2011  . Hypertension   . Hyperlipidemia   . Hx of cardiac cath   . Coronary atherosclerosis of native coronary artery    Past Medical History:  Diagnosis Date  . Anxiety   . Bunion 09/29/2011  . Cancer (HCC)    nose/Dr. Albertini;skin  . Chronic constipation   . Chronic kidney disease    stage 111  . Coronary atherosclerosis of native coronary artery    2009 LAD CIRC DES  . Depression    doesn't take any meds for this  . Diabetes mellitus    takes Januvia daily  . Dizziness   . Enlarged prostate    but doesn't require meds at present  . GERD (gastroesophageal reflux disease)    TUMS prn  . H/O hiatal hernia   . Headache(784.0)   . History of gout    doesn't require meds  . Hx of cardiac cath   . Hyperlipidemia    Crestor 3 x wk and Zetia daily  . Hypertension    takes Hyzaar daily  . Insomnia     doesn't require meds at present time  . Joint pain   . Onychomycosis 10/05/2012   x 10  . PONV (postoperative nausea and vomiting)    pt states extremely sick  . Shortness of breath    with exertion    Family History  Problem Relation Age of Onset  . Stroke Father   . Cancer Brother     Past Surgical History:  Procedure Laterality Date  . bladder cancer    . CARDIAC CATHETERIZATION  2013  . cataracts removed    . CHOLECYSTECTOMY    . COLONOSCOPY    . CORONARY ANGIOPLASTY     2 stents from 2009  . CORONARY ARTERY BYPASS GRAFT  10/29/2011   Procedure: CORONARY ARTERY BYPASS GRAFTING (CABG);  Surgeon: Ivin Poot, MD;  Location: [facility];  Service: Open Heart Surgery;  Laterality: N/A;  . cyst removed from finger     right hand  . EYE SURGERY     bilateral cataracts  . HEMORRHOID SURGERY    . HERNIA REPAIR     x 2;inguinal  . KIDNEY STONE SURGERY    . LEFT HEART CATHETERIZATION WITH CORONARY ANGIOGRAM N/A 10/21/2011   Procedure: LEFT HEART CATHETERIZATION WITH CORONARY ANGIOGRAM;  Surgeon: Candee Furbish, MD;  Location: [facility] CATH LAB;  Service: Cardiovascular;  Laterality: N/A;  . rotator cuff surgery     right   . skin cancer removed     Social History   Occupational History  . Not on file.   Social History Main Topics  . Smoking status: Former Smoker    Packs/day: 1.50    Years: 30.00    Types: Cigarettes    Quit date: 03/02/1984  . Smokeless tobacco: Never Used  . Alcohol use No  . Drug use: No  . Sexual activity: Not Currently

## 2016-02-13 NOTE — Telephone Encounter (Signed)
I called spoke with pharmacist to advise to transpose the 3 &4 so subsequently prescription is lowering is day.

## 2016-02-14 ENCOUNTER — Ambulatory Visit (INDEPENDENT_AMBULATORY_CARE_PROVIDER_SITE_OTHER): Payer: Self-pay | Admitting: Sports Medicine

## 2016-02-14 ENCOUNTER — Telehealth (INDEPENDENT_AMBULATORY_CARE_PROVIDER_SITE_OTHER): Payer: Self-pay | Admitting: Orthopedic Surgery

## 2016-02-14 NOTE — Telephone Encounter (Signed)
Pt is taking  prednisone states it says to take 6 tab daily for 2 days then says 5 tabs for 2 days, pt wants to know if he takes 2 at a time or spreads it out. Pt number is [phone]

## 2016-02-14 NOTE — Telephone Encounter (Signed)
Called advised ok to take 2 in am 2 in afternoon and 2 in evening.

## 2016-02-17 ENCOUNTER — Other Ambulatory Visit: Payer: Self-pay | Admitting: Cardiology

## 2016-02-25 ENCOUNTER — Ambulatory Visit: Payer: Medicare Other | Admitting: Podiatry

## 2016-02-26 ENCOUNTER — Encounter: Payer: Self-pay | Admitting: Podiatry

## 2016-02-26 ENCOUNTER — Ambulatory Visit (INDEPENDENT_AMBULATORY_CARE_PROVIDER_SITE_OTHER): Payer: Medicare Other | Admitting: Podiatry

## 2016-02-26 VITALS — BP 133/64 | HR 73 | Resp 18

## 2016-02-26 DIAGNOSIS — B351 Tinea unguium: Secondary | ICD-10-CM

## 2016-02-26 DIAGNOSIS — M79676 Pain in unspecified toe(s): Secondary | ICD-10-CM | POA: Diagnosis not present

## 2016-02-26 NOTE — Patient Instructions (Signed)
Move the Band-Aid on the left great toe 1-3 days and apply topical antibiotic ointment and Band-Aid until a scab forms  Diabetes and Foot Care Diabetes may cause you to have problems because of poor blood supply (circulation) to your feet and legs. This may cause the skin on your feet to become thinner, break easier, and heal more slowly. Your skin may become dry, and the skin may peel and crack. You may also have nerve damage in your legs and feet causing decreased feeling in them. You may not notice minor injuries to your feet that could lead to infections or more serious problems. Taking care of your feet is one of the most important things you can do for yourself. Follow these instructions at home:  Wear shoes at all times, even in the house. Do not go barefoot. Bare feet are easily injured.  Check your feet daily for blisters, cuts, and redness. If you cannot see the bottom of your feet, use a mirror or ask someone for help.  Wash your feet with warm water (do not use hot water) and mild soap. Then pat your feet and the areas between your toes until they are completely dry. Do not soak your feet as this can dry your skin.  Apply a moisturizing lotion or petroleum jelly (that does not contain alcohol and is unscented) to the skin on your feet and to dry, brittle toenails. Do not apply lotion between your toes.  Trim your toenails straight across. Do not dig under them or around the cuticle. File the edges of your nails with an emery board or nail file.  Do not cut corns or calluses or try to remove them with medicine.  Wear clean socks or stockings every day. Make sure they are not too tight. Do not wear knee-high stockings since they may decrease blood flow to your legs.  Wear shoes that fit properly and have enough cushioning. To break in new shoes, wear them for just a few hours a day. This prevents you from injuring your feet. Always look in your shoes before you put them on to be sure  there are no objects inside.  Do not cross your legs. This may decrease the blood flow to your feet.  If you find a minor scrape, cut, or break in the skin on your feet, keep it and the skin around it clean and dry. These areas may be cleansed with mild soap and water. Do not cleanse the area with peroxide, alcohol, or iodine.  When you remove an adhesive bandage, be sure not to damage the skin around it.  If you have a wound, look at it several times a day to make sure it is healing.  Do not use heating pads or hot water bottles. They may burn your skin. If you have lost feeling in your feet or legs, you may not know it is happening until it is too late.  Make sure your health care provider performs a complete foot exam at least annually or more often if you have foot problems. Report any cuts, sores, or bruises to your health care provider immediately. Contact a health care provider if:  You have an injury that is not healing.  You have cuts or breaks in the skin.  You have an ingrown nail.  You notice redness on your legs or feet.  You feel burning or tingling in your legs or feet.  You have pain or cramps in your legs and feet.  Your legs or feet are numb.  Your feet always feel cold. Get help right away if:  There is increasing redness, swelling, or pain in or around a wound.  There is a red line that goes up your leg.  Pus is coming from a wound.  You develop a fever or as directed by your health care provider.  You notice a bad smell coming from an ulcer or wound. This information is not intended to replace advice given to you by your health care provider. Make sure you discuss any questions you have with your health care provider. Document Released: 02/14/2000 Document Revised: 07/25/2015 Document Reviewed: 07/26/2012 Elsevier Interactive Patient Education  2017 [employer].

## 2016-02-26 NOTE — Progress Notes (Signed)
Patient ID: Cody Thomas, male   DOB: 06-15-1930, 82 y.o.   MRN: [id number]    Subjective: This patient presents again for schedule visit complaining of painful toenails walking wearing shoes and request toenail debridement  Objective: Orientated 3 DP and PT pulses 2/4 bilaterally Capillary reflex immediate bilaterally Sensation to 10 g monofilament wire intact 5/5 bilaterally Vibratory sensation reactive bilaterally Ankle reflexes equal and reactive bilaterally No skin lesions bilaterally Atrophic skin bilaterally HAV bilaterally Manual motor testing dorsi flexion, plantar flexion 5/5 bilaterally The toenails are elongated, brittle, hypertrophic, incurvated, discolored and tender to direct palpation 6-10  Assessment: Symptomatic onychomycoses 6-10 Type II diabetic  Plan: Debrided toenails 10 mechanically and electrically with slight bleeding distal first left toe treated with topical antibiotic ointment and Band-Aid. Patient instructed removed Band-Aid 1-3 days and continue apply topical antibiotic ointment and Band-Aid until a scab forms  Reappoint 3 months

## 2016-02-28 ENCOUNTER — Telehealth (INDEPENDENT_AMBULATORY_CARE_PROVIDER_SITE_OTHER): Payer: Self-pay | Admitting: Family

## 2016-03-03 ENCOUNTER — Other Ambulatory Visit (INDEPENDENT_AMBULATORY_CARE_PROVIDER_SITE_OTHER): Payer: Self-pay | Admitting: Orthopedic Surgery

## 2016-03-03 ENCOUNTER — Other Ambulatory Visit (INDEPENDENT_AMBULATORY_CARE_PROVIDER_SITE_OTHER): Payer: Medicare Other

## 2016-03-03 DIAGNOSIS — E041 Nontoxic single thyroid nodule: Secondary | ICD-10-CM

## 2016-03-03 DIAGNOSIS — E1165 Type 2 diabetes mellitus with hyperglycemia: Secondary | ICD-10-CM | POA: Diagnosis not present

## 2016-03-03 LAB — URINALYSIS, ROUTINE W REFLEX MICROSCOPIC
Bilirubin Urine: NEGATIVE
Hgb urine dipstick: NEGATIVE
Ketones, ur: NEGATIVE
Leukocytes, UA: NEGATIVE
Nitrite: NEGATIVE
Specific Gravity, Urine: 1.02 (ref 1.000–1.030)
Total Protein, Urine: NEGATIVE
Urine Glucose: 100 — AB
Urobilinogen, UA: 0.2 (ref 0.0–1.0)
pH: 6 (ref 5.0–8.0)

## 2016-03-03 LAB — HEMOGLOBIN A1C: Hgb A1c MFr Bld: 8 % — ABNORMAL HIGH (ref 4.6–6.5)

## 2016-03-03 LAB — COMPREHENSIVE METABOLIC PANEL
ALT: 34 U/L (ref 0–53)
AST: 22 U/L (ref 0–37)
Albumin: 4 g/dL (ref 3.5–5.2)
Alkaline Phosphatase: 42 U/L (ref 39–117)
BUN: 19 mg/dL (ref 6–23)
CO2: 32 mEq/L (ref 19–32)
Calcium: 10 mg/dL (ref 8.4–10.5)
Chloride: 97 mEq/L (ref 96–112)
Creatinine, Ser: 1.46 mg/dL (ref 0.40–1.50)
GFR: 48.96 mL/min — ABNORMAL LOW (ref >60.00)
Glucose, Bld: 210 mg/dL — ABNORMAL HIGH (ref 70–99)
Potassium: 4.3 mEq/L (ref 3.5–5.1)
Sodium: 134 mEq/L — ABNORMAL LOW (ref 135–145)
Total Bilirubin: 1.1 mg/dL (ref 0.2–1.2)
Total Protein: 6.7 g/dL (ref 6.0–8.3)

## 2016-03-03 LAB — TSH: TSH: 1.87 u[IU]/mL (ref 0.35–4.50)

## 2016-03-03 LAB — MICROALBUMIN / CREATININE URINE RATIO
Creatinine,U: 222.3 mg/dL
Microalb Creat Ratio: 0.9 mg/g (ref 0.0–30.0)
Microalb, Ur: 2.1 mg/dL — ABNORMAL HIGH (ref 0.0–1.9)

## 2016-03-03 LAB — LIPID PANEL
Cholesterol: 148 mg/dL (ref 0–200)
HDL: 35.6 mg/dL — ABNORMAL LOW (ref >39.00)
LDL Cholesterol: 75 mg/dL (ref 0–99)
NonHDL: 112.7
Total CHOL/HDL Ratio: 4
Triglycerides: 190 mg/dL — ABNORMAL HIGH (ref 0.0–149.0)
VLDL: 38 mg/dL (ref 0.0–40.0)

## 2016-03-03 MED ORDER — METHOCARBAMOL 500 MG PO TABS: 500.0000 mg | ORAL_TABLET | Freq: Three times a day (TID) | ORAL | 0 refills | Status: DC

## 2016-03-03 NOTE — Telephone Encounter (Signed)
rx written

## 2016-03-03 NOTE — Telephone Encounter (Signed)
Called in robaxin to [facility] in [place]

## 2016-03-05 ENCOUNTER — Ambulatory Visit (INDEPENDENT_AMBULATORY_CARE_PROVIDER_SITE_OTHER): Payer: Medicare Other | Admitting: Family

## 2016-03-05 ENCOUNTER — Encounter (INDEPENDENT_AMBULATORY_CARE_PROVIDER_SITE_OTHER): Payer: Self-pay | Admitting: Orthopedic Surgery

## 2016-03-05 VITALS — Ht 65.0 in | Wt 188.0 lb

## 2016-03-05 DIAGNOSIS — M5442 Lumbago with sciatica, left side: Secondary | ICD-10-CM | POA: Diagnosis not present

## 2016-03-05 NOTE — Progress Notes (Signed)
Office Visit Note   Patient: Cody Thomas           Date of Birth: [birth year]           MRN: [id number] Visit Date: 03/05/2016              Requested by: Leighton Ruff, MD [place], [place] [address] PCP: Gerrit Heck, MD  Chief Complaint  Patient presents with  . Lower Back - Follow-up    Chronic low back pain with left sided radicular symptoms      HPI: Patient is follow up for chronic low back pain with left sided radicular pain. Given rx for prednisone and tramadol at last visit and states that he was feeling a little better until the pred taper was complete and then the pain returned. Autumn L Forrest, RMA  Asian is an 83 year old gentleman who is seen today in follow-up for left-sided sciatica. Has completed a prednisone taper which was helpful only was taking it since completion of the pain has returned. Does have tramadol which she has taken sporadically. States this helps a little bit.      Assessment & Plan: Visit Diagnoses:  1. Acute left-sided low back pain with left-sided sciatica     Plan: Discussed possibility of him an MRI to proceed with ESI. Patient declined at this time. Have provided an order for physical therapy which she will take to the [facility] location that is convenient for him. This was handwritten. We will follow-up with him in 6 weeks' time.  Follow-Up Instructions: Return 4-6 weeks.   Physical Exam: Patient is alert and oriented. No adenopathy. Well-dressed. Normal affect. Respirations easy. Steady gait. No spinous process tenderness. No weakness in lower extremities. Does have positive straight leg raise on left.   Imaging: No results found.  Orders:  No orders of the defined types were placed in this encounter.  No orders of the defined types were placed in this encounter.    Procedures: No procedures performed  Clinical Data: No additional findings.  Subjective: Review of Systems    Constitutional: Negative for chills and fever.  Musculoskeletal: Positive for back pain.    Objective: Vital Signs: Ht 5\' 5"  (1.651 m)   Wt 188 lb (85.3 kg)   BMI 31.28 kg/m   Specialty Comments:  No specialty comments available.  PMFS History: Patient Active Problem List   Diagnosis Date Noted  . Degenerative disc disease, lumbar 02/13/2016  . Chronic left-sided low back pain with left-sided sciatica 02/13/2016  . Left thyroid nodule 01/21/2015  . Pulmonary nodules/lesions, multiple 10/30/2014  . Bladder cancer ([employer]) 08/09/2014  . Type II or unspecified type diabetes mellitus without mention of complication, not stated as uncontrolled 10/20/2012  . Onychomycosis 10/05/2012  . Chest pain, unspecified 10/21/2011  . Hypertension   . Hyperlipidemia   . Hx of cardiac cath   . Coronary atherosclerosis of native coronary artery    Past Medical History:  Diagnosis Date  . Anxiety   . Bunion 09/29/2011  . Cancer (HCC)    nose/Dr. Albertini;skin  . Chronic constipation   . Chronic kidney disease    stage 111  . Coronary atherosclerosis of native coronary artery    2009 LAD CIRC DES  . Depression    doesn't take any meds for this  . Diabetes mellitus    takes Januvia daily  . Dizziness   . Enlarged prostate    but doesn't require meds at present  .  GERD (gastroesophageal reflux disease)    TUMS prn  . H/O hiatal hernia   . Headache(784.0)   . History of gout    doesn't require meds  . Hx of cardiac cath   . Hyperlipidemia    Crestor 3 x wk and Zetia daily  . Hypertension    takes Hyzaar daily  . Insomnia    doesn't require meds at present time  . Joint pain   . Onychomycosis 10/05/2012   x 10  . PONV (postoperative nausea and vomiting)    pt states extremely sick  . Shortness of breath    with exertion    Family History  Problem Relation Age of Onset  . Stroke Father   . Cancer Brother     Past Surgical History:  Procedure Laterality Date  . bladder  cancer    . CARDIAC CATHETERIZATION  2013  . cataracts removed    . CHOLECYSTECTOMY    . COLONOSCOPY    . CORONARY ANGIOPLASTY     2 stents from 2009  . CORONARY ARTERY BYPASS GRAFT  10/29/2011   Procedure: CORONARY ARTERY BYPASS GRAFTING (CABG);  Surgeon: Ivin Poot, MD;  Location: [place];  Service: Open Heart Surgery;  Laterality: N/A;  . cyst removed from finger     right hand  . EYE SURGERY     bilateral cataracts  . HEMORRHOID SURGERY    . HERNIA REPAIR     x 2;inguinal  . KIDNEY STONE SURGERY    . LEFT HEART CATHETERIZATION WITH CORONARY ANGIOGRAM N/A 10/21/2011   Procedure: LEFT HEART CATHETERIZATION WITH CORONARY ANGIOGRAM;  Surgeon: Candee Furbish, MD;  Location: [facility] CATH LAB;  Service: Cardiovascular;  Laterality: N/A;  . rotator cuff surgery     right   . skin cancer removed     Social History   Occupational History  . Not on file.   Social History Main Topics  . Smoking status: Former Smoker    Packs/day: 1.50    Years: 30.00    Types: Cigarettes    Quit date: 03/02/1984  . Smokeless tobacco: Never Used  . Alcohol use No  . Drug use: No  . Sexual activity: Not Currently

## 2016-03-06 ENCOUNTER — Other Ambulatory Visit: Payer: Self-pay | Admitting: Endocrinology

## 2016-03-06 ENCOUNTER — Ambulatory Visit (INDEPENDENT_AMBULATORY_CARE_PROVIDER_SITE_OTHER): Payer: Medicare Other | Admitting: Endocrinology

## 2016-03-06 VITALS — BP 128/80 | HR 68 | Ht 65.16 in | Wt 183.0 lb

## 2016-03-06 DIAGNOSIS — E1165 Type 2 diabetes mellitus with hyperglycemia: Secondary | ICD-10-CM

## 2016-03-06 MED ORDER — GLIMEPIRIDE 1 MG PO TABS: 1.0000 mg | ORAL_TABLET | Freq: Every day | ORAL | 1 refills | Status: DC

## 2016-03-06 NOTE — Progress Notes (Signed)
Patient ID: Cody Thomas, male   DOB: 03-15-1930, 83 y.o.   MRN: [id number]   Reason for Appointment: Diabetes follow-up   History of Present Illness   Diagnosis: Type 2 DIABETES MELITUS  He has had mild diabetes which has been previously erratic partly because of dietary inconsistency and difficulty with weight loss He has not been continued on metformin because of renal dysfunction Was given low-dose Januvia instead but this was too expensive for him even though it was helping his control In 2014 he was given low dose Amaryl instead which appears to be keeping his blood sugars controlled  His blood sugars were not well controlled when he was seen in 1/16 with A1c 7.8  Recent history:  Since 1/16 he has been on metformin and he is  taking 1000 mg daily without any side effects. Also his Amaryl was stopped previously because of occasional mild hypoglycemia . A1c is now much higher at 8% compared to August last year It has been as low as 6.2  Current blood sugar patterns, problems and management:  He says he has been having low back pain and sciatica for a couple of months and has not checked his blood sugar much  His lab glucose was over 200 fasting but he had a reading of 136 this morning; not clear if his test strips are out of date since he has only 3 readings in the last month.  He got a Dosepak of prednisone from the orthopedic surgeon for about 7 days starting on 12/15 but he was not aware that this would increase his blood sugars.  He has not been able to do any physical activity  His diet has been fair with some occasional sweets but not as much as before and he also thinks that his portion size has been smaller with somewhat decreased appetite             Monitors blood glucose: Once a day or less .    Glucometer:   Accu-Chek         Blood Glucose readings from meter download:  He has only 3 readings: Fasting 136, 164 and 9 PM = 425  Dietitian  consultation last done several years ago Physical activity: exercise: None recently because of sciatica  Wt Readings from Last 3 Encounters:  03/06/16 183 lb (83 kg)  03/05/16 188 lb (85.3 kg)  02/13/16 188 lb (85.3 kg)   DM LABS:  Lab Results  Component Value Date   HGBA1C 8.0 (H) 03/03/2016   HGBA1C 6.6 (H) 10/31/2015   HGBA1C 6.2 06/17/2015   Lab Results  Component Value Date   MICROALBUR 2.1 (H) 03/03/2016   LDLCALC 75 03/03/2016   CREATININE 1.46 03/03/2016   Lab on 03/03/2016  Component Date Value Ref Range Status  . Hgb A1c MFr Bld 03/03/2016 8.0* 4.6 - 6.5 % Final  . Sodium 03/03/2016 134* 135 - 145 mEq/L Final  . Potassium 03/03/2016 4.3  3.5 - 5.1 mEq/L Final  . Chloride 03/03/2016 97  96 - 112 mEq/L Final  . CO2 03/03/2016 32  19 - 32 mEq/L Final  . Glucose, Bld 03/03/2016 210* 70 - 99 mg/dL Final  . BUN 03/03/2016 19  6 - 23 mg/dL Final  . Creatinine, Ser 03/03/2016 1.46  0.40 - 1.50 mg/dL Final  . Total Bilirubin 03/03/2016 1.1  0.2 - 1.2 mg/dL Final  . Alkaline Phosphatase 03/03/2016 42  39 - 117 U/L Final  . AST 03/03/2016  22  0 - 37 U/L Final  . ALT 03/03/2016 34  0 - 53 U/L Final  . Total Protein 03/03/2016 6.7  6.0 - 8.3 g/dL Final  . Albumin 03/03/2016 4.0  3.5 - 5.2 g/dL Final  . Calcium 03/03/2016 10.0  8.4 - 10.5 mg/dL Final  . GFR 03/03/2016 48.96* >60.00 mL/min Final  . Cholesterol 03/03/2016 148  0 - 200 mg/dL Final  . Triglycerides 03/03/2016 190.0* 0.0 - 149.0 mg/dL Final  . HDL 03/03/2016 35.60* >39.00 mg/dL Final  . VLDL 03/03/2016 38.0  0.0 - 40.0 mg/dL Final  . LDL Cholesterol 03/03/2016 75  0 - 99 mg/dL Final  . Total CHOL/HDL Ratio 03/03/2016 4   Final  . NonHDL 03/03/2016 112.70   Final  . TSH 03/03/2016 1.87  0.35 - 4.50 uIU/mL Final  . Microalb, Ur 03/03/2016 2.1* 0.0 - 1.9 mg/dL Final  . Creatinine,U 03/03/2016 222.3  mg/dL Final  . Microalb Creat Ratio 03/03/2016 0.9  0.0 - 30.0 mg/g Final  . Color, Urine 03/03/2016 YELLOW   Yellow;Lt. Yellow Final  . APPearance 03/03/2016 CLEAR  Clear Final  . Specific Gravity, Urine 03/03/2016 1.020  1.000 - 1.030 Final  . pH 03/03/2016 6.0  5.0 - 8.0 Final  . Total Protein, Urine 03/03/2016 NEGATIVE  Negative Final  . Urine Glucose 03/03/2016 100* Negative Final  . Ketones, ur 03/03/2016 NEGATIVE  Negative Final  . Bilirubin Urine 03/03/2016 NEGATIVE  Negative Final  . Hgb urine dipstick 03/03/2016 NEGATIVE  Negative Final  . Urobilinogen, UA 03/03/2016 0.2  0.0 - 1.0 Final  . Leukocytes, UA 03/03/2016 NEGATIVE  Negative Final  . Nitrite 03/03/2016 NEGATIVE  Negative Final  . WBC, UA 03/03/2016 0-2/hpf  0-2/hpf Final  . RBC / HPF 03/03/2016 0-2/hpf  0-2/hpf Final  . Mucus, UA 03/03/2016 Presence of* None Final  . Squamous Epithelial / LPF 03/03/2016 Rare(0-4/hpf)  Rare(0-4/hpf) Final  . Hyaline Casts, UA 03/03/2016 Presence of* None Final     Allergies as of 03/06/2016      Reactions   Crestor [rosuvastatin]    Nightmares in high dosage   Sertraline Diarrhea   Zocor [simvastatin]    Nightmares in high dosage      Medication List       Accurate as of 03/06/16  2:40 PM. Always use your most recent med list.          aspirin 325 MG tablet Take 325 mg by mouth daily.   calcium carbonate 500 MG chewable tablet Commonly known as:  TUMS - dosed in mg elemental calcium Chew 2 tablets by mouth daily as needed. For indigestion.   Coenzyme Q10-Vitamin E 100-1 MG-UNT/5ML Syrp Take 100 mg by mouth daily. Take 2 tablespoons (100MG ) Daily   fish oil-omega-3 fatty acids 1000 MG capsule Take 1,200 mg by mouth daily. 4 1200 tablets daily   fluticasone 50 MCG/ACT nasal spray Commonly known as:  FLONASE Place 1 spray into both nostrils daily as needed for allergies or rhinitis.   glimepiride 1 MG tablet Commonly known as:  AMARYL Take 1 tablet (1 mg total) by mouth daily with breakfast.   losartan-hydrochlorothiazide 100-25 MG tablet Commonly known as:  HYZAAR TAKE  1 TABLET BY MOUTH DAILY   metFORMIN 500 MG 24 hr tablet Commonly known as:  GLUCOPHAGE-XR TAKE 1 TABLET BY MOUTH TWICE DAILY   methocarbamol 500 MG tablet Commonly known as:  ROBAXIN Take 1 tablet (500 mg total) by mouth 3 (three) times daily.  metoprolol tartrate 25 MG tablet Commonly known as:  LOPRESSOR TAKE 1 TABLET BY MOUTH TWICE DAILY   predniSONE 10 MG tablet Commonly known as:  DELTASONE 6 tablets x 2 days, 3 x 2days, then 4 x 2 days, then 3 x 2 days, then 2 x 2 days, then 1 x 2 days   rosuvastatin 20 MG tablet Commonly known as:  CRESTOR Take 20 mg by mouth every other day.   traMADol 50 MG tablet Commonly known as:  ULTRAM Take 1 tablet (50 mg total) by mouth every 6 (six) hours as needed.       Allergies:  Allergies  Allergen Reactions  . Crestor [Rosuvastatin]     Nightmares in high dosage  . Sertraline Diarrhea  . Zocor [Simvastatin]     Nightmares in high dosage    Past Medical History:  Diagnosis Date  . Anxiety   . Bunion 09/29/2011  . Cancer (HCC)    nose/Dr. Albertini;skin  . Chronic constipation   . Chronic kidney disease    stage 111  . Coronary atherosclerosis of native coronary artery    2009 LAD CIRC DES  . Depression    doesn't take any meds for this  . Diabetes mellitus    takes Januvia daily  . Dizziness   . Enlarged prostate    but doesn't require meds at present  . GERD (gastroesophageal reflux disease)    TUMS prn  . H/O hiatal hernia   . Headache(784.0)   . History of gout    doesn't require meds  . Hx of cardiac cath   . Hyperlipidemia    Crestor 3 x wk and Zetia daily  . Hypertension    takes Hyzaar daily  . Insomnia    doesn't require meds at present time  . Joint pain   . Onychomycosis 10/05/2012   x 10  . PONV (postoperative nausea and vomiting)    pt states extremely sick  . Shortness of breath    with exertion    Past Surgical History:  Procedure Laterality Date  . bladder cancer    . CARDIAC  CATHETERIZATION  2013  . cataracts removed    . CHOLECYSTECTOMY    . COLONOSCOPY    . CORONARY ANGIOPLASTY     2 stents from 2009  . CORONARY ARTERY BYPASS GRAFT  10/29/2011   Procedure: CORONARY ARTERY BYPASS GRAFTING (CABG);  Surgeon: Ivin Poot, MD;  Location: [place];  Service: Open Heart Surgery;  Laterality: N/A;  . cyst removed from finger     right hand  . EYE SURGERY     bilateral cataracts  . HEMORRHOID SURGERY    . HERNIA REPAIR     x 2;inguinal  . KIDNEY STONE SURGERY    . LEFT HEART CATHETERIZATION WITH CORONARY ANGIOGRAM N/A 10/21/2011   Procedure: LEFT HEART CATHETERIZATION WITH CORONARY ANGIOGRAM;  Surgeon: Candee Furbish, MD;  Location: [facility] CATH LAB;  Service: Cardiovascular;  Laterality: N/A;  . rotator cuff surgery     right   . skin cancer removed      Family History  Problem Relation Age of Onset  . Stroke Father   . Cancer Brother     Social History:  reports that he quit smoking about 32 years ago. His smoking use included Cigarettes. He has a 45.00 pack-year smoking history. He has never used smokeless tobacco. He reports that he does not drink alcohol or use drugs.  Review of Systems:  HYPERTENSION:  fairly well-controlled on losartan HCT  RENAL dysfunction: This is Stable and mild  Lab Results  Component Value Date   CREATININE 1.46 03/03/2016   BUN 19 03/03/2016   NA 134 (L) 03/03/2016   K 4.3 03/03/2016   CL 97 03/03/2016   CO2 32 03/03/2016     HYPERLIPIDEMIA: The lipid abnormality consists of elevated LDL and low HDL treated with Lipitor   Given 80 mg Lipitor dose by cardiologist, this causes nightmares  Currently taking Crestor 20 mg now Also followed by PCP and cardiologist    Lab Results  Component Value Date   CHOL 148 03/03/2016   HDL 35.60 (L) 03/03/2016   LDLCALC 75 03/03/2016   TRIG 190.0 (H) 03/03/2016   CHOLHDL 4 03/03/2016     Thyroid nodule: The nodule was same On a recent ultrasound in December done by his  PCP He also has a 12 mm probably pseudo-nodule present     Examination:   BP 128/80   Pulse 68   Ht 5' 5.16" (1.655 m)   Wt 183 lb (83 kg)   SpO2 96%   BMI 30.31 kg/m   Body mass index is 30.31 kg/m.  Marland Kitchen   Exam not indicated  ASSESSMENT/ PLAN:   Diabetes type 2 with  obesity  The patient's diabetes control is much worse as indicated above with A1c 8% Not clear why his blood sugars are higher and he may have some progression of his diabetes He is taking metformin only in the doses limited by his renal function  He has not been compliant with checking his blood sugar because of issues with his sciatic are Also had steroids last month causing some temporary increase in blood sugars However this week his fasting glucose was over 200 in the lab His home test strips were not checked and days may be out of date, he had a reading of 136 this morning at home  For now will add Amaryl 1 mg daily again to help his control for now Will have him start checking his blood sugars more consistently and he will make sure his test strips are not out of date Discussed when to check his blood sugars and ideal targets  Follow-up in 6 weeks     Patient Instructions  Glimeperide 1mg  in am with Metformin  Check blood sugars on waking up  2x weekly  Also check blood sugars about 2 hours after a meal and do this after different meals by rotation  Recommended blood sugar levels on waking up is 90-130 and about 2 hours after meal is 130-160  Please bring your blood sugar monitor to each visit, thank you        [facility] 03/06/2016, 2:40 PM

## 2016-03-06 NOTE — Patient Instructions (Signed)
Glimeperide 1mg  in am with Metformin  Check blood sugars on waking up  2x weekly  Also check blood sugars about 2 hours after a meal and do this after different meals by rotation  Recommended blood sugar levels on waking up is 90-130 and about 2 hours after meal is 130-160  Please bring your blood sugar monitor to each visit, thank you

## 2016-03-16 DIAGNOSIS — M545 Low back pain: Secondary | ICD-10-CM | POA: Diagnosis not present

## 2016-03-16 DIAGNOSIS — E119 Type 2 diabetes mellitus without complications: Secondary | ICD-10-CM | POA: Diagnosis not present

## 2016-03-16 DIAGNOSIS — M5416 Radiculopathy, lumbar region: Secondary | ICD-10-CM | POA: Diagnosis not present

## 2016-03-19 ENCOUNTER — Other Ambulatory Visit (INDEPENDENT_AMBULATORY_CARE_PROVIDER_SITE_OTHER): Payer: Self-pay | Admitting: Family

## 2016-03-20 NOTE — Telephone Encounter (Signed)
Do you wish to refill? 

## 2016-03-23 DIAGNOSIS — M5416 Radiculopathy, lumbar region: Secondary | ICD-10-CM | POA: Diagnosis not present

## 2016-03-23 DIAGNOSIS — M545 Low back pain: Secondary | ICD-10-CM | POA: Diagnosis not present

## 2016-04-02 DIAGNOSIS — M545 Low back pain: Secondary | ICD-10-CM | POA: Diagnosis not present

## 2016-04-02 DIAGNOSIS — M5416 Radiculopathy, lumbar region: Secondary | ICD-10-CM | POA: Diagnosis not present

## 2016-04-06 DIAGNOSIS — M545 Low back pain: Secondary | ICD-10-CM | POA: Diagnosis not present

## 2016-04-06 DIAGNOSIS — M5416 Radiculopathy, lumbar region: Secondary | ICD-10-CM | POA: Diagnosis not present

## 2016-04-07 DIAGNOSIS — M5416 Radiculopathy, lumbar region: Secondary | ICD-10-CM | POA: Diagnosis not present

## 2016-04-07 DIAGNOSIS — M545 Low back pain: Secondary | ICD-10-CM | POA: Diagnosis not present

## 2016-04-09 ENCOUNTER — Encounter: Payer: Self-pay | Admitting: Cardiology

## 2016-04-09 DIAGNOSIS — M545 Low back pain: Secondary | ICD-10-CM | POA: Diagnosis not present

## 2016-04-09 DIAGNOSIS — M5416 Radiculopathy, lumbar region: Secondary | ICD-10-CM | POA: Diagnosis not present

## 2016-04-13 ENCOUNTER — Ambulatory Visit (INDEPENDENT_AMBULATORY_CARE_PROVIDER_SITE_OTHER): Payer: Medicare Other | Admitting: Cardiology

## 2016-04-13 ENCOUNTER — Encounter: Payer: Self-pay | Admitting: Cardiology

## 2016-04-13 ENCOUNTER — Encounter (INDEPENDENT_AMBULATORY_CARE_PROVIDER_SITE_OTHER): Payer: Self-pay

## 2016-04-13 VITALS — BP 132/72 | HR 70 | Ht 65.6 in | Wt 183.8 lb

## 2016-04-13 DIAGNOSIS — M545 Low back pain: Secondary | ICD-10-CM | POA: Diagnosis not present

## 2016-04-13 DIAGNOSIS — C679 Malignant neoplasm of bladder, unspecified: Secondary | ICD-10-CM | POA: Diagnosis not present

## 2016-04-13 DIAGNOSIS — E7800 Pure hypercholesterolemia, unspecified: Secondary | ICD-10-CM

## 2016-04-13 DIAGNOSIS — I10 Essential (primary) hypertension: Secondary | ICD-10-CM

## 2016-04-13 DIAGNOSIS — I2510 Atherosclerotic heart disease of native coronary artery without angina pectoris: Secondary | ICD-10-CM

## 2016-04-13 DIAGNOSIS — M5416 Radiculopathy, lumbar region: Secondary | ICD-10-CM | POA: Diagnosis not present

## 2016-04-13 NOTE — Progress Notes (Signed)
.     [address], [address] [place], [place]  [address] Phone: [phone] Fax:  [phone]  Date:  04/13/2016   ID:  Cody Thomas, DOB Oct 04, 1930, MRN [id number]  PCP:  Gaye Alken, MD   History of Present Illness: Cody Thomas is a 83 y.o. male with coronary disease status post 4 vessel bypass surgery 10/29/11 by Dr. Maren Beach here for followup. He a brief postoperative atrial fibrillation. He has had some irritation/itching in his chest wall post bypass.   Overall no anginal like symptoms. Doing well. Main complaint is left leg sciatica. He has been seeing orthopedics for this.  Unfortunately found bladder cancer after bout of kidney stone. He is now in remission and is being watched by Dr. Earlene Plater.  No significant shortness of breath, no syncope, no faint-like spells, no bleeding  He decided that he did not like the way atorvastatin and feel, he resumed Crestor 3 times a week.  Wt Readings from Last 3 Encounters:  04/13/16 183 lb 12.8 oz (83.4 kg)  03/06/16 183 lb (83 kg)  03/05/16 188 lb (85.3 kg)     Past Medical History:  Diagnosis Date  . Anxiety   . Bunion 09/29/2011  . Cancer (HCC)    nose/Dr. Albertini;skin  . Chronic constipation   . Chronic kidney disease    stage 111  . Coronary atherosclerosis of native coronary artery    2009 LAD CIRC DES  . Depression    doesn't take any meds for this  . Diabetes mellitus    takes Januvia daily  . Dizziness   . Enlarged prostate    but doesn't require meds at present  . GERD (gastroesophageal reflux disease)    TUMS prn  . H/O hiatal hernia   . Headache(784.0)   . History of gout    doesn't require meds  . Hx of cardiac cath   . Hyperlipidemia    Crestor 3 x wk and Zetia daily  . Hypertension    takes Hyzaar daily  . Insomnia    doesn't require meds at present time  . Joint pain   . Onychomycosis 10/05/2012   x 10  . PONV (postoperative nausea and vomiting)    pt states  extremely sick  . Shortness of breath    with exertion    Past Surgical History:  Procedure Laterality Date  . bladder cancer    . CARDIAC CATHETERIZATION  2013  . cataracts removed    . CHOLECYSTECTOMY    . COLONOSCOPY    . CORONARY ANGIOPLASTY     2 stents from 2009  . CORONARY ARTERY BYPASS GRAFT  10/29/2011   Procedure: CORONARY ARTERY BYPASS GRAFTING (CABG);  Surgeon: Kerin Perna, MD;  Location: [facility] OR;  Service: Open Heart Surgery;  Laterality: N/A;  . cyst removed from finger     right hand  . EYE SURGERY     bilateral cataracts  . HEMORRHOID SURGERY    . HERNIA REPAIR     x 2;inguinal  . KIDNEY STONE SURGERY    . LEFT HEART CATHETERIZATION WITH CORONARY ANGIOGRAM N/A 10/21/2011   Procedure: LEFT HEART CATHETERIZATION WITH CORONARY ANGIOGRAM;  Surgeon: Donato Schultz, MD;  Location: [facility] CATH LAB;  Service: Cardiovascular;  Laterality: N/A;  . rotator cuff surgery     right   . skin cancer removed      Current Outpatient Prescriptions  Medication Sig Dispense Refill  . aspirin 325 MG tablet  Take 325 mg by mouth daily.    . calcium carbonate (TUMS - DOSED IN MG ELEMENTAL CALCIUM) 500 MG chewable tablet Chew 2 tablets by mouth daily as needed. For indigestion.     . Coenzyme Q10-Vitamin E 100-1 MG-UNT/5ML SYRP Take 100 mg by mouth daily. Take 2 tablespoons (100MG ) Daily    . fish oil-omega-3 fatty acids 1000 MG capsule Take 1,200 mg by mouth daily. 4 1200 tablets daily    . fluticasone (FLONASE) 50 MCG/ACT nasal spray Place 1 spray into both nostrils daily as needed for allergies or rhinitis.    Marland Kitchen glimepiride (AMARYL) 1 MG tablet Take 1 tablet (1 mg total) by mouth daily with breakfast. 30 tablet 1  . LORazepam (ATIVAN) 0.5 MG tablet TK 1/2 TO 1 T PO QD PRN  0  . losartan-hydrochlorothiazide (HYZAAR) 100-25 MG tablet TAKE 1 TABLET BY MOUTH DAILY 90 tablet 3  . metFORMIN (GLUCOPHAGE-XR) 500 MG 24 hr tablet TAKE 1 TABLET BY MOUTH TWICE DAILY 180 tablet 0  . methocarbamol  (ROBAXIN) 500 MG tablet Take 1 tablet (500 mg total) by mouth 3 (three) times daily. 30 tablet 0  . metoprolol tartrate (LOPRESSOR) 25 MG tablet TAKE 1 TABLET BY MOUTH TWICE DAILY 180 tablet 1  . predniSONE (DELTASONE) 10 MG tablet 6 tablets x 2 days, 3 x 2days, then 4 x 2 days, then 3 x 2 days, then 2 x 2 days, then 1 x 2 days 42 tablet 0  . rosuvastatin (CRESTOR) 20 MG tablet Take 20 mg by mouth every other day.    . traMADol (ULTRAM) 50 MG tablet TAKE 1 TABLET BY MOUTH EVERY 6 HOURS AS NEEDED 30 tablet 0   No current facility-administered medications for this visit.     Allergies:    Allergies  Allergen Reactions  . Crestor [Rosuvastatin]     Nightmares in high dosage  . Sertraline Diarrhea  . Zocor [Simvastatin]     Nightmares in high dosage    Social History:  The patient  reports that he quit smoking about 32 years ago. His smoking use included Cigarettes. He has a 45.00 pack-year smoking history. He has never used smokeless tobacco. He reports that he does not drink alcohol or use drugs.   ROS:  Please see the history of present illness.   Mild HA at times. Chest soreness. No CVA. Positive waking. Trouble with dyspnea when bending over.  PHYSICAL EXAM: VS:  BP 132/72   Pulse 70   Ht 5' 5.6" (1.666 m)   Wt 183 lb 12.8 oz (83.4 kg)   BMI 30.03 kg/m  Well nourished, well developed, in no acute distress  HEENT: normal  Neck: no JVD  Cardiac:  normal S1, S2; RRR; no murmur  Lungs:  clear to auscultation bilaterally, no wheezing, rhonchi or rales  Abd: soft, nontender, no hepatomegaly overweight. Protuberant Ext: no edema  Skin: warm and dry  Neuro: no focal abnormalities noted  EKG: EKG today 04/13/16-sinus rhythm first-degree AV block heart rate 70 PR interval 264 ms. Personally viewed-prior 08/21/13 - Sinus bradycardia, first degree AV block, PR interval 298 ms otherwise unremarkable.  ASSESSMENT AND PLAN:  1. Hyperlipidemia- Crestor 20 mg 3 times a week.  Dr. Zachery Dauer has  been following blood work  2. First degree AVB - previously, 260 ms today. Stable. No syncope. No need for pacemaker right now. 3. Occasional palpitations-continuing with low-dose metoprolol. I am aware of his first-degree AV block. If this worsens, we will  need to stop this medication. We discussed the possibility of pacemaker in the future.  4. Hypertension-good overall control.  5. CAD-stable, post bypass. No current typical anginal symptoms.  6. Obesity-continue to encourage weight loss. 7. Bladder cancer-Dr. Earlene Plater. Post chemotherapy. Found after kidney stone. He is monitoring this every 3 months. 8. 73-month follow-up  Signed, Donato Schultz, MD [facility]  04/13/2016 3:22 PM

## 2016-04-13 NOTE — Patient Instructions (Addendum)

## 2016-04-15 DIAGNOSIS — J209 Acute bronchitis, unspecified: Secondary | ICD-10-CM | POA: Diagnosis not present

## 2016-04-16 ENCOUNTER — Other Ambulatory Visit: Payer: Medicare Other

## 2016-04-20 ENCOUNTER — Ambulatory Visit: Payer: Medicare Other | Admitting: Endocrinology

## 2016-04-24 DIAGNOSIS — M79672 Pain in left foot: Secondary | ICD-10-CM | POA: Diagnosis not present

## 2016-04-24 DIAGNOSIS — Z7984 Long term (current) use of oral hypoglycemic drugs: Secondary | ICD-10-CM | POA: Diagnosis not present

## 2016-04-24 DIAGNOSIS — E119 Type 2 diabetes mellitus without complications: Secondary | ICD-10-CM | POA: Diagnosis not present

## 2016-05-04 DIAGNOSIS — Z87442 Personal history of urinary calculi: Secondary | ICD-10-CM | POA: Diagnosis not present

## 2016-05-04 DIAGNOSIS — C672 Malignant neoplasm of lateral wall of bladder: Secondary | ICD-10-CM | POA: Diagnosis not present

## 2016-05-07 DIAGNOSIS — M5416 Radiculopathy, lumbar region: Secondary | ICD-10-CM | POA: Diagnosis not present

## 2016-05-07 DIAGNOSIS — M545 Low back pain: Secondary | ICD-10-CM | POA: Diagnosis not present

## 2016-05-08 ENCOUNTER — Other Ambulatory Visit (INDEPENDENT_AMBULATORY_CARE_PROVIDER_SITE_OTHER): Payer: Medicare Other

## 2016-05-08 DIAGNOSIS — E1165 Type 2 diabetes mellitus with hyperglycemia: Secondary | ICD-10-CM | POA: Diagnosis not present

## 2016-05-08 LAB — BASIC METABOLIC PANEL
BUN: 17 mg/dL (ref 6–23)
CO2: 28 meq/L (ref 19–32)
Calcium: 10.4 mg/dL (ref 8.4–10.5)
Chloride: 104 meq/L (ref 96–112)
Creatinine, Ser: 1.26 mg/dL (ref 0.40–1.50)
GFR: 58.01 mL/min — ABNORMAL LOW (ref >60.00)
Glucose, Bld: 138 mg/dL — ABNORMAL HIGH (ref 70–99)
Potassium: 4.1 meq/L (ref 3.5–5.1)
Sodium: 139 meq/L (ref 135–145)

## 2016-05-09 LAB — FRUCTOSAMINE: Fructosamine: 248 umol/L (ref 0–285)

## 2016-05-11 ENCOUNTER — Other Ambulatory Visit: Payer: Self-pay | Admitting: Cardiology

## 2016-05-11 DIAGNOSIS — E119 Type 2 diabetes mellitus without complications: Secondary | ICD-10-CM | POA: Diagnosis not present

## 2016-05-12 ENCOUNTER — Encounter: Payer: Self-pay | Admitting: Endocrinology

## 2016-05-12 ENCOUNTER — Ambulatory Visit (INDEPENDENT_AMBULATORY_CARE_PROVIDER_SITE_OTHER): Payer: Medicare Other | Admitting: Endocrinology

## 2016-05-12 VITALS — BP 160/84 | HR 94 | Temp 98.0°F | Resp 16 | Wt 186.1 lb

## 2016-05-12 DIAGNOSIS — E1165 Type 2 diabetes mellitus with hyperglycemia: Secondary | ICD-10-CM

## 2016-05-12 DIAGNOSIS — M5416 Radiculopathy, lumbar region: Secondary | ICD-10-CM | POA: Diagnosis not present

## 2016-05-12 DIAGNOSIS — M545 Low back pain: Secondary | ICD-10-CM | POA: Diagnosis not present

## 2016-05-12 NOTE — Progress Notes (Signed)
Patient ID: Cody Thomas, male   DOB: 05/17/1930, 83 y.o.   MRN: [id number]   Reason for Appointment: Diabetes follow-up   History of Present Illness   Diagnosis: Type 2 DIABETES MELITUS  He has had mild diabetes which has been previously erratic partly because of dietary inconsistency and difficulty with weight loss He has not been continued on metformin because of renal dysfunction Was given low-dose Januvia instead but this was too expensive for him even though it was helping his control In 2014 he was given low dose Amaryl instead which appears to be keeping his blood sugars controlled  His blood sugars were not well controlled when he was seen in 1/16 with A1c 7.8  Recent history:  Since 1/16 he has been on metformin and he is  taking 1000 mg daily without any side effects. Previously Amaryl was stopped previously because of occasional mild hypoglycemia but this was started back in 1/18 because of his A1c going up to 8%, previously as low as 6.2% .  Current blood sugar patterns, problems and management:  He has had fairly good blood sugars although checking mostly fasting and has only one high reading the morning  No hypoglycemia with Amaryl, taking a half of the 2 mg he has at home  Also taking metformin consistently, renal function is stable  Again his diet is not consistent and he has gained weight.  He has not been able to do any physical activity partly because of intercurrent illnesses  Has only 2 readings at night around 170            Monitors blood glucose: Once a day or less .    Glucometer:   Accu-Chek         Blood Glucose readings from meter download:  Mean values apply above for all meters except median for One Touch  PRE-MEAL Fasting Lunch Dinner Bedtime Overall  Glucose range: 95-169 178   169, 172    Mean/median: 112         Dietitian consultation last done several years ago Physical activity: exercise: None recently    Wt Readings  from Last 3 Encounters:  05/12/16 186 lb 2 oz (84.4 kg)  04/13/16 183 lb 12.8 oz (83.4 kg)  03/06/16 183 lb (83 kg)   DM LABS:  Lab Results  Component Value Date   HGBA1C 8.0 (H) 03/03/2016   HGBA1C 6.6 (H) 10/31/2015   HGBA1C 6.2 06/17/2015   Lab Results  Component Value Date   MICROALBUR 2.1 (H) 03/03/2016   LDLCALC 75 03/03/2016   CREATININE 1.26 05/08/2016   Lab on 05/08/2016  Component Date Value Ref Range Status  . Sodium 05/08/2016 139  135 - 145 mEq/L Final  . Potassium 05/08/2016 4.1  3.5 - 5.1 mEq/L Final  . Chloride 05/08/2016 104  96 - 112 mEq/L Final  . CO2 05/08/2016 28  19 - 32 mEq/L Final  . Glucose, Bld 05/08/2016 138* 70 - 99 mg/dL Final  . BUN 05/08/2016 17  6 - 23 mg/dL Final  . Creatinine, Ser 05/08/2016 1.26  0.40 - 1.50 mg/dL Final  . Calcium 05/08/2016 10.4  8.4 - 10.5 mg/dL Final  . GFR 05/08/2016 58.01* >60.00 mL/min Final  . Fructosamine 05/08/2016 248  0 - 285 umol/L Final   Comment: Published reference interval for apparently healthy subjects between age 41 and 59 is 65 - 285 umol/L and in a poorly controlled diabetic population is 228 - 563 umol/L with  a mean of 396 umol/L.      Allergies as of 05/12/2016      Reactions   Crestor [rosuvastatin]    Nightmares in high dosage   Sertraline Diarrhea   Zocor [simvastatin]    Nightmares in high dosage      Medication List       Accurate as of 05/12/16  1:28 PM. Always use your most recent med list.          aspirin 325 MG tablet Take 325 mg by mouth daily.   calcium carbonate 500 MG chewable tablet Commonly known as:  TUMS - dosed in mg elemental calcium Chew 2 tablets by mouth daily as needed. For indigestion.   Coenzyme Q10-Vitamin E 100-1 MG-UNT/5ML Syrp Take 100 mg by mouth daily. Take 2 tablespoons (100MG ) Daily   fish oil-omega-3 fatty acids 1000 MG capsule Take 1,200 mg by mouth daily. 4 1200 tablets daily   fluticasone 50 MCG/ACT nasal spray Commonly known as:   FLONASE Place 1 spray into both nostrils daily as needed for allergies or rhinitis.   glimepiride 1 MG tablet Commonly known as:  AMARYL Take 1 tablet (1 mg total) by mouth daily with breakfast.   LORazepam 0.5 MG tablet Commonly known as:  ATIVAN TK 1/2 TO 1 T PO QD PRN   losartan-hydrochlorothiazide 100-25 MG tablet Commonly known as:  HYZAAR TAKE 1 TABLET BY MOUTH DAILY   metFORMIN 500 MG 24 hr tablet Commonly known as:  GLUCOPHAGE-XR TAKE 1 TABLET BY MOUTH TWICE DAILY   methocarbamol 500 MG tablet Commonly known as:  ROBAXIN Take 1 tablet (500 mg total) by mouth 3 (three) times daily.   metoprolol tartrate 25 MG tablet Commonly known as:  LOPRESSOR TAKE 1 TABLET BY MOUTH TWICE DAILY   predniSONE 10 MG tablet Commonly known as:  DELTASONE 6 tablets x 2 days, 3 x 2days, then 4 x 2 days, then 3 x 2 days, then 2 x 2 days, then 1 x 2 days   rosuvastatin 20 MG tablet Commonly known as:  CRESTOR Take 20 mg by mouth every other day.   traMADol 50 MG tablet Commonly known as:  ULTRAM TAKE 1 TABLET BY MOUTH EVERY 6 HOURS AS NEEDED       Allergies:  Allergies  Allergen Reactions  . Crestor [Rosuvastatin]     Nightmares in high dosage  . Sertraline Diarrhea  . Zocor [Simvastatin]     Nightmares in high dosage    Past Medical History:  Diagnosis Date  . Anxiety   . Bunion 09/29/2011  . Cancer (HCC)    nose/Dr. Albertini;skin  . Chronic constipation   . Chronic kidney disease    stage 111  . Coronary atherosclerosis of native coronary artery    2009 LAD CIRC DES  . Depression    doesn't take any meds for this  . Diabetes mellitus    takes Januvia daily  . Dizziness   . Enlarged prostate    but doesn't require meds at present  . GERD (gastroesophageal reflux disease)    TUMS prn  . H/O hiatal hernia   . Headache(784.0)   . History of gout    doesn't require meds  . Hx of cardiac cath   . Hyperlipidemia    Crestor 3 x wk and Zetia daily  .  Hypertension    takes Hyzaar daily  . Insomnia    doesn't require meds at present time  . Joint pain   .  Onychomycosis 10/05/2012   x 10  . PONV (postoperative nausea and vomiting)    pt states extremely sick  . Shortness of breath    with exertion    Past Surgical History:  Procedure Laterality Date  . bladder cancer    . CARDIAC CATHETERIZATION  2013  . cataracts removed    . CHOLECYSTECTOMY    . COLONOSCOPY    . CORONARY ANGIOPLASTY     2 stents from 2009  . CORONARY ARTERY BYPASS GRAFT  10/29/2011   Procedure: CORONARY ARTERY BYPASS GRAFTING (CABG);  Surgeon: Ivin Poot, MD;  Location: [place];  Service: Open Heart Surgery;  Laterality: N/A;  . cyst removed from finger     right hand  . EYE SURGERY     bilateral cataracts  . HEMORRHOID SURGERY    . HERNIA REPAIR     x 2;inguinal  . KIDNEY STONE SURGERY    . LEFT HEART CATHETERIZATION WITH CORONARY ANGIOGRAM N/A 10/21/2011   Procedure: LEFT HEART CATHETERIZATION WITH CORONARY ANGIOGRAM;  Surgeon: Candee Furbish, MD;  Location: [facility] CATH LAB;  Service: Cardiovascular;  Laterality: N/A;  . rotator cuff surgery     right   . skin cancer removed      Family History  Problem Relation Age of Onset  . Stroke Father   . Cancer Brother     Social History:  reports that he quit smoking about 32 years ago. His smoking use included Cigarettes. He has a 45.00 pack-year smoking history. He has never used smokeless tobacco. He reports that he does not drink alcohol or use drugs.  Review of Systems:  HYPERTENSION: was fairly well-controlled on losartan HCT, He has not taken his losartan for 2 days and blood pressure is high today  RENAL dysfunction: This is Stable and mild  Lab Results  Component Value Date   CREATININE 1.26 05/08/2016   BUN 17 05/08/2016   NA 139 05/08/2016   K 4.1 05/08/2016   CL 104 05/08/2016   CO2 28 05/08/2016     HYPERLIPIDEMIA: The lipid abnormality consists of elevated LDL and low HDL treated  with Statin drugs   Currently taking Crestor 20 mg  Also followed by PCP and cardiologist    Lab Results  Component Value Date   CHOL 148 03/03/2016   HDL 35.60 (L) 03/03/2016   LDLCALC 75 03/03/2016   TRIG 190.0 (H) 03/03/2016   CHOLHDL 4 03/03/2016     Thyroid nodule: The nodule was same On a recent ultrasound in December done by his PCP He also has a 12 mm probably pseudo-nodule present     Examination:   BP (!) 160/84 (BP Location: Left Arm, Patient Position: Sitting, Cuff Size: Normal)   Pulse 94   Temp 98 F (36.7 C) (Oral)   Resp 16   Wt 186 lb 2 oz (84.4 kg)   SpO2 98%   BMI 30.41 kg/m   Body mass index is 30.41 kg/m.  Marland Kitchen   Exam not indicated  ASSESSMENT/ PLAN:   Diabetes type 2 with  obesity See history of present illness for detailed discussion of current diabetes management, blood sugar patterns and problems identified  The patient's diabetes control is improved with adding 1 mg Amaryl Previously A1c was 8%  He has fairly good fasting readings, postprandial readings when checked are usually below 180 Overall control adequate for his age and his fructosamine is also excellent  Discussed that he has gained weight and he needs to  get back into walking when he can Also needs to cut back on portions and sweets More readings after meals     There are no Patient Instructions on file for this visit.    Song Garris 05/12/2016, 1:28 PM

## 2016-05-12 NOTE — Patient Instructions (Signed)
Check blood sugars on waking up    Also check blood sugars about 2 hours after a meal and do this after different meals by rotation  Recommended blood sugar levels on waking up is 90-130 and about 2 hours after meal is 130-180  Please bring your blood sugar monitor to each visit, thank you

## 2016-05-14 DIAGNOSIS — M5416 Radiculopathy, lumbar region: Secondary | ICD-10-CM | POA: Diagnosis not present

## 2016-05-14 DIAGNOSIS — M545 Low back pain: Secondary | ICD-10-CM | POA: Diagnosis not present

## 2016-05-18 DIAGNOSIS — C672 Malignant neoplasm of lateral wall of bladder: Secondary | ICD-10-CM | POA: Diagnosis not present

## 2016-05-19 DIAGNOSIS — M545 Low back pain: Secondary | ICD-10-CM | POA: Diagnosis not present

## 2016-05-19 DIAGNOSIS — M5416 Radiculopathy, lumbar region: Secondary | ICD-10-CM | POA: Diagnosis not present

## 2016-05-20 ENCOUNTER — Other Ambulatory Visit: Payer: Self-pay | Admitting: Endocrinology

## 2016-05-21 DIAGNOSIS — M5416 Radiculopathy, lumbar region: Secondary | ICD-10-CM | POA: Diagnosis not present

## 2016-05-21 DIAGNOSIS — M545 Low back pain: Secondary | ICD-10-CM | POA: Diagnosis not present

## 2016-05-26 ENCOUNTER — Ambulatory Visit: Payer: Medicare Other | Admitting: Podiatry

## 2016-05-26 DIAGNOSIS — C672 Malignant neoplasm of lateral wall of bladder: Secondary | ICD-10-CM | POA: Diagnosis not present

## 2016-05-27 DIAGNOSIS — M545 Low back pain: Secondary | ICD-10-CM | POA: Diagnosis not present

## 2016-05-27 DIAGNOSIS — M5416 Radiculopathy, lumbar region: Secondary | ICD-10-CM | POA: Diagnosis not present

## 2016-05-28 DIAGNOSIS — M5416 Radiculopathy, lumbar region: Secondary | ICD-10-CM | POA: Diagnosis not present

## 2016-05-28 DIAGNOSIS — M545 Low back pain: Secondary | ICD-10-CM | POA: Diagnosis not present

## 2016-05-29 ENCOUNTER — Ambulatory Visit: Payer: Medicare Other | Admitting: Podiatry

## 2016-06-02 DIAGNOSIS — C672 Malignant neoplasm of lateral wall of bladder: Secondary | ICD-10-CM | POA: Diagnosis not present

## 2016-06-03 ENCOUNTER — Other Ambulatory Visit: Payer: Self-pay | Admitting: Podiatry

## 2016-06-03 ENCOUNTER — Ambulatory Visit (INDEPENDENT_AMBULATORY_CARE_PROVIDER_SITE_OTHER): Payer: Medicare Other | Admitting: Podiatry

## 2016-06-03 ENCOUNTER — Encounter: Payer: Self-pay | Admitting: Podiatry

## 2016-06-03 DIAGNOSIS — B351 Tinea unguium: Secondary | ICD-10-CM

## 2016-06-03 DIAGNOSIS — M1A0720 Idiopathic chronic gout, left ankle and foot, without tophus (tophi): Secondary | ICD-10-CM

## 2016-06-03 DIAGNOSIS — M79676 Pain in unspecified toe(s): Secondary | ICD-10-CM

## 2016-06-03 MED ORDER — MELOXICAM 15 MG PO TABS: 15.0000 mg | ORAL_TABLET | Freq: Every day | ORAL | 0 refills | Status: DC

## 2016-06-03 NOTE — Progress Notes (Signed)
Complaint:  Visit Type: Patient returns to my office for continued preventative foot care services. Complaint: Patient states" my nails have grown long and thick and become painful to walk and wear shoes" Patient has been diagnosed with DM with no foot complications. The patient presents for preventative foot care services. No changes to ROS.  Patient has pain in big toe joint left foot.  Doctor diagnosed him with gout 4 weeks ago.  Pain is still presents.  Podiatric Exam: Vascular: dorsalis pedis and posterior tibial pulses are palpable bilateral. Capillary return is immediate. Temperature gradient is WNL. Skin turgor WNL  Sensorium: Normal Semmes Weinstein monofilament test. Normal tactile sensation bilaterally. Nail Exam: Pt has thick disfigured discolored nails with subungual debris noted bilateral entire nail hallux through fifth toenails Ulcer Exam: There is no evidence of ulcer or pre-ulcerative changes or infection. Orthopedic Exam: Muscle tone and strength are WNL. No limitations in general ROM. No crepitus or effusions noted. HAV  B/L. Skin: No Porokeratosis. No infection or ulcers  Diagnosis:  Onychomycosis, , Pain in right toe, pain in left toes,  Gout  Treatment & Plan Procedures and Treatment: Consent by patient was obtained for treatment procedures. The patient understood the discussion of treatment and procedures well. All questions were answered thoroughly reviewed. Debridement of mycotic and hypertrophic toenails, 1 through 5 bilateral and clearing of subungual debris. No ulceration, no infection noted. Prescribed Mobic for gout pain. Return Visit-Office Procedure: Patient instructed to return to the office for a follow up visit 3 months for continued evaluation and treatment.    Gardiner Barefoot DPM

## 2016-06-04 DIAGNOSIS — M5416 Radiculopathy, lumbar region: Secondary | ICD-10-CM | POA: Diagnosis not present

## 2016-06-04 DIAGNOSIS — M545 Low back pain: Secondary | ICD-10-CM | POA: Diagnosis not present

## 2016-06-08 DIAGNOSIS — M545 Low back pain: Secondary | ICD-10-CM | POA: Diagnosis not present

## 2016-06-08 DIAGNOSIS — M79672 Pain in left foot: Secondary | ICD-10-CM | POA: Diagnosis not present

## 2016-06-08 DIAGNOSIS — M5416 Radiculopathy, lumbar region: Secondary | ICD-10-CM | POA: Diagnosis not present

## 2016-07-16 ENCOUNTER — Ambulatory Visit (INDEPENDENT_AMBULATORY_CARE_PROVIDER_SITE_OTHER): Payer: Self-pay

## 2016-07-16 ENCOUNTER — Ambulatory Visit (INDEPENDENT_AMBULATORY_CARE_PROVIDER_SITE_OTHER): Payer: Medicare Other | Admitting: Specialist

## 2016-07-16 ENCOUNTER — Encounter (INDEPENDENT_AMBULATORY_CARE_PROVIDER_SITE_OTHER): Payer: Self-pay | Admitting: Specialist

## 2016-07-16 VITALS — BP 158/84 | HR 60 | Ht 65.0 in | Wt 180.0 lb

## 2016-07-16 DIAGNOSIS — M25572 Pain in left ankle and joints of left foot: Secondary | ICD-10-CM | POA: Diagnosis not present

## 2016-07-16 DIAGNOSIS — M4316 Spondylolisthesis, lumbar region: Secondary | ICD-10-CM

## 2016-07-16 DIAGNOSIS — M48062 Spinal stenosis, lumbar region with neurogenic claudication: Secondary | ICD-10-CM | POA: Diagnosis not present

## 2016-07-16 DIAGNOSIS — M79672 Pain in left foot: Secondary | ICD-10-CM | POA: Diagnosis not present

## 2016-07-16 MED ORDER — GABAPENTIN 100 MG PO CAPS: 100.0000 mg | ORAL_CAPSULE | Freq: Every day | ORAL | 6 refills | Status: DC

## 2016-07-16 MED ORDER — ALLOPURINOL 100 MG PO TABS: 100.0000 mg | ORAL_TABLET | Freq: Every day | ORAL | 3 refills | Status: DC

## 2016-07-16 NOTE — Progress Notes (Signed)
Office Visit Note   Patient: Cody Thomas           Date of Birth: Sep 15, 1930           MRN: [id number] Visit Date: 07/16/2016              Requested by: Leighton Ruff, MD [place], [place] [address] PCP: Leighton Ruff, MD   Assessment & Plan: Visit Diagnoses:  1. Left foot pain   2. Pain in left ankle and joints of left foot   3. Spondylolisthesis, lumbar region   4. Spinal stenosis of lumbar region with neurogenic claudication     Plan: Avoid bending, stooping and avoid lifting weights greater than 10 lbs. Avoid prolong standing and walking. Avoid frequent bending and stooping  No lifting greater than 10 lbs. May use ice or moist heat for pain. Weight loss is of benefit. Handicap license is approved.  Follow-Up Instructions: Return in about 3 weeks (around 08/06/2016).   Orders:  Orders Placed This Encounter  Procedures  . XR Foot 2 Views Left  . XR Ankle Complete Left  . Sed Rate (ESR)  . Uric acid  . Ambulatory referral to Neurology   Meds ordered this encounter  Medications  . gabapentin (NEURONTIN) 100 MG capsule    Sig: Take 1 capsule (100 mg total) by mouth at bedtime.    Dispense:  30 capsule    Refill:  6  . allopurinol (ZYLOPRIM) 100 MG tablet    Sig: Take 1 tablet (100 mg total) by mouth daily.    Dispense:  30 tablet    Refill:  3      Procedures: No procedures performed   Clinical Data: No additional findings.   Subjective: Chief Complaint  Patient presents with  . Left Ankle - Pain  . Left Foot - Pain    83 year old male with greater than one year history of back and left buttock pain and radiation into the left lower extremity. Pain is into the left great toe at times severe. History of gout, history of nephrolithisis, no gout medications, does not want to take antiarthritis meds due to concerns of cardiac risks.  Pain is sharp knife like, no numbness or paresthesias. No bowel or bladder  difficulty.    Review of Systems  HENT: Positive for sinus pressure and sneezing.   Eyes: Negative.   Respiratory: Positive for cough, shortness of breath and wheezing.   Cardiovascular: Positive for leg swelling.  Gastrointestinal: Negative.   Endocrine: Negative.   Genitourinary: Negative.   Musculoskeletal: Positive for arthralgias, back pain, gait problem and joint swelling.  Skin: Negative.   Allergic/Immunologic: Negative.   Neurological: Positive for weakness and numbness.  Hematological: Negative.   Psychiatric/Behavioral: Negative.      Objective: Vital Signs: BP (!) 158/84 (BP Location: Left Arm, Patient Position: Sitting)   Pulse 60   Ht '5\' 5"'  (1.651 m)   Wt 180 lb (81.6 kg)   BMI 29.95 kg/m   Physical Exam  Constitutional: He is oriented to person, place, and time. He appears well-developed and well-nourished.  HENT:  Head: Normocephalic and atraumatic.  Eyes: EOM are normal. Pupils are equal, round, and reactive to light.  Neck: Normal range of motion. Neck supple.  Pulmonary/Chest: Effort normal and breath sounds normal.  Abdominal: Soft. Bowel sounds are normal.  Neurological: He is alert and oriented to person, place, and time.  Skin: Skin is warm and dry.  Psychiatric: He  has a normal mood and affect. His behavior is normal. Judgment and thought content normal.    Back Exam   Tenderness  The patient is experiencing tenderness in the lumbar.  Range of Motion  Extension: abnormal  Flexion: normal  Lateral Bend Right: abnormal  Lateral Bend Left: abnormal  Rotation Right: abnormal  Rotation Left: abnormal   Muscle Strength  Right Quadriceps:  5/5  Left Quadriceps:  5/5  Right Hamstrings:  5/5  Left Hamstrings:  5/5   Tests  Straight leg raise right: negative Straight leg raise left: negative  Reflexes  Patellar: Hyporeflexic Achilles: Hyporeflexic Babinski's sign: normal   Other  Toe Walk: abnormal Heel Walk: abnormal Sensation:  decreased Gait: normal   Comments:  Weak left EHL4/5      Specialty Comments:  No specialty comments available.  Imaging: Xr Ankle Complete Left  Result Date: 07/16/2016 Ankle AP and Oblique radiographs show the left ankle with minimal narrowing of the superolateral ankle margin no osteophytes, no lytic changes.  Xr Foot 2 Views Left  Result Date: 07/16/2016 AP and Lateral radiographs of the left foot show degenerative narrowing of the left great toe MTP joint with minimal lytic change of the medial margin of the great toe metatarsophalangeal joint. There is also narrowing of the IP joint with spurs medial and lateral distal phalanx. Talotibial joint without significant narrowing.    PMFS History: Patient Active Problem List   Diagnosis Date Noted  . Degenerative disc disease, lumbar 02/13/2016  . Chronic left-sided low back pain with left-sided sciatica 02/13/2016  . Left thyroid nodule 01/21/2015  . Pulmonary nodules/lesions, multiple 10/30/2014  . Bladder cancer (Combine) 08/09/2014  . Type II or unspecified type diabetes mellitus without mention of complication, not stated as uncontrolled 10/20/2012  . Onychomycosis 10/05/2012  . Chest pain, unspecified 10/21/2011  . Hypertension   . Hyperlipidemia   . Hx of cardiac cath   . Coronary atherosclerosis of native coronary artery    Past Medical History:  Diagnosis Date  . Anxiety   . Bunion 09/29/2011  . Cancer (HCC)    nose/Dr. Albertini;skin  . Chronic constipation   . Chronic kidney disease    stage 111  . Coronary atherosclerosis of native coronary artery    2009 LAD CIRC DES  . Depression    doesn't take any meds for this  . Diabetes mellitus    takes Januvia daily  . Dizziness   . Enlarged prostate    but doesn't require meds at present  . GERD (gastroesophageal reflux disease)    TUMS prn  . H/O hiatal hernia   . Headache(784.0)   . History of gout    doesn't require meds  . Hx of cardiac cath   .  Hyperlipidemia    Crestor 3 x wk and Zetia daily  . Hypertension    takes Hyzaar daily  . Insomnia    doesn't require meds at present time  . Joint pain   . Onychomycosis 10/05/2012   x 10  . PONV (postoperative nausea and vomiting)    pt states extremely sick  . Shortness of breath    with exertion    Family History  Problem Relation Age of Onset  . Stroke Father   . Cancer Brother     Past Surgical History:  Procedure Laterality Date  . bladder cancer    . CARDIAC CATHETERIZATION  2013  . cataracts removed    . CHOLECYSTECTOMY    . COLONOSCOPY    .  CORONARY ANGIOPLASTY     2 stents from 2009  . CORONARY ARTERY BYPASS GRAFT  10/29/2011   Procedure: CORONARY ARTERY BYPASS GRAFTING (CABG);  Surgeon: Ivin Poot, MD;  Location: [facility];  Service: Open Heart Surgery;  Laterality: N/A;  . cyst removed from finger     right hand  . EYE SURGERY     bilateral cataracts  . HEMORRHOID SURGERY    . HERNIA REPAIR     x 2;inguinal  . KIDNEY STONE SURGERY    . LEFT HEART CATHETERIZATION WITH CORONARY ANGIOGRAM N/A 10/21/2011   Procedure: LEFT HEART CATHETERIZATION WITH CORONARY ANGIOGRAM;  Surgeon: Candee Furbish, MD;  Location: [facility] CATH LAB;  Service: Cardiovascular;  Laterality: N/A;  . rotator cuff surgery     right   . skin cancer removed     Social History   Occupational History  . Not on file.   Social History Main Topics  . Smoking status: Former Smoker    Packs/day: 1.50    Years: 30.00    Types: Cigarettes    Quit date: 03/02/1984  . Smokeless tobacco: Never Used  . Alcohol use No  . Drug use: No  . Sexual activity: Not Currently

## 2016-07-16 NOTE — Patient Instructions (Signed)
Avoid bending, stooping and avoid lifting weights greater than 10 lbs. Avoid prolong standing and walking. Avoid frequent bending and stooping  No lifting greater than 10 lbs. May use ice or moist heat for pain. Weight loss is of benefit. Handicap license is approved.  

## 2016-07-17 LAB — URIC ACID: Uric Acid, Serum: 8 mg/dL (ref 4.0–8.0)

## 2016-07-17 LAB — SEDIMENTATION RATE: Sed Rate: 4 mm/hr (ref 0–20)

## 2016-07-20 DIAGNOSIS — N183 Chronic kidney disease, stage 3 (moderate): Secondary | ICD-10-CM | POA: Diagnosis not present

## 2016-07-20 DIAGNOSIS — E7800 Pure hypercholesterolemia, unspecified: Secondary | ICD-10-CM | POA: Diagnosis not present

## 2016-07-20 DIAGNOSIS — I10 Essential (primary) hypertension: Secondary | ICD-10-CM | POA: Diagnosis not present

## 2016-07-20 DIAGNOSIS — F339 Major depressive disorder, recurrent, unspecified: Secondary | ICD-10-CM | POA: Diagnosis not present

## 2016-08-07 ENCOUNTER — Ambulatory Visit (INDEPENDENT_AMBULATORY_CARE_PROVIDER_SITE_OTHER): Payer: Medicare Other | Admitting: Specialist

## 2016-08-12 ENCOUNTER — Other Ambulatory Visit (INDEPENDENT_AMBULATORY_CARE_PROVIDER_SITE_OTHER): Payer: Medicare Other

## 2016-08-12 DIAGNOSIS — E1165 Type 2 diabetes mellitus with hyperglycemia: Secondary | ICD-10-CM | POA: Diagnosis not present

## 2016-08-12 LAB — COMPREHENSIVE METABOLIC PANEL
ALT: 20 U/L (ref 0–53)
AST: 22 U/L (ref 0–37)
Albumin: 4.2 g/dL (ref 3.5–5.2)
Alkaline Phosphatase: 44 U/L (ref 39–117)
BUN: 20 mg/dL (ref 6–23)
CO2: 30 meq/L (ref 19–32)
Calcium: 9.8 mg/dL (ref 8.4–10.5)
Chloride: 103 meq/L (ref 96–112)
Creatinine, Ser: 1.36 mg/dL (ref 0.40–1.50)
GFR: 53.08 mL/min — ABNORMAL LOW (ref >60.00)
Glucose, Bld: 116 mg/dL — ABNORMAL HIGH (ref 70–99)
Potassium: 4.2 meq/L (ref 3.5–5.1)
Sodium: 139 mEq/L (ref 135–145)
Total Bilirubin: 0.8 mg/dL (ref 0.2–1.2)
Total Protein: 6.9 g/dL (ref 6.0–8.3)

## 2016-08-12 LAB — HEMOGLOBIN A1C: Hgb A1c MFr Bld: 6.3 % (ref 4.6–6.5)

## 2016-08-15 DIAGNOSIS — E119 Type 2 diabetes mellitus without complications: Secondary | ICD-10-CM | POA: Diagnosis not present

## 2016-08-17 ENCOUNTER — Encounter (INDEPENDENT_AMBULATORY_CARE_PROVIDER_SITE_OTHER): Payer: Self-pay

## 2016-08-17 ENCOUNTER — Ambulatory Visit: Payer: Medicare Other | Admitting: Endocrinology

## 2016-08-17 ENCOUNTER — Encounter: Payer: Self-pay | Admitting: Neurology

## 2016-08-17 ENCOUNTER — Ambulatory Visit (INDEPENDENT_AMBULATORY_CARE_PROVIDER_SITE_OTHER): Payer: Medicare Other | Admitting: Neurology

## 2016-08-17 VITALS — BP 156/84 | HR 64 | Ht 65.0 in | Wt 186.0 lb

## 2016-08-17 DIAGNOSIS — G8929 Other chronic pain: Secondary | ICD-10-CM | POA: Diagnosis not present

## 2016-08-17 DIAGNOSIS — M5442 Lumbago with sciatica, left side: Secondary | ICD-10-CM | POA: Diagnosis not present

## 2016-08-17 NOTE — Progress Notes (Signed)
PATIENT: AMANDEEP NESMITH DOB: 12/22/1930  Chief Complaint  Patient presents with  . Left Foot Pain    He had left-sided sciatica in November 2017 and he went to PT through March 2018.  During PT, he started to experience intermittent left foot pain, swelling and numbness.  He has also been recently diagnosed with gout in his left big toe.  He is taking allopurinol 100mg  daily.  He is not taking gabapentin right now because he noticed a rash on his arms while on the medication.  Marland Kitchen PCP    Leighton Ruff, MD  . Orthopedic Surgery    Jessy Oto, MD - referring MD     HISTORICAL  RIVERS HAMRICK is 83 years old right-handed male, seen in refer by orthopedic surgeon Dr. Basil Dess E for evaluation of left foot swelling, and numbness, his primary care physician is Dr. Leighton Ruff, initial evaluation was on August 17 2016.  I reviewed and summarized referring note, he had past medical history of gout, is taking allopurinol 100 mg, most recent flareup is in April 2018, involving his left first toe, diabetes for many years, hypertension, hyperlipidemia, coronary artery disease, status post CABG history of depression anxiety, incidental finding of bladder cancer in 2016 while treatting kidney stone, had surgical removal, and irrigation chemotherapy.  He had a history of chronic low back pain, in 2012, he had a flareup of right side low back pain, radiating to right lower extremity. January 2018, he had a flareup of left-sided low back pain, radiating to left hip, along left posterior spine, to left calf, medial leg, and top of left foot, at the same time he experienced gout flareup of left toe.  His left leg discomfort has much improved with physical therapy, but now he had persistent left toe numbness, hypersensitive to touch, mild left ankle swelling, mild difficulty walking due to pain,  He denies bowel and bladder incontinence,  I have personally reviewed MRI lumbar in Jan  2016: Multilevel degenerative disc disease, most severe at left L4-5, and 30 cc on degenerative basis, moderate stenosis, mild to moderate stenosis at L 3 4, severe foraminal narrowing on the right side at L3-4, left side at L4-5,  He was given a prescription of gabapentin, but he took over-the-counter supplement together with gabapentin has developed a rash, he is planning on to take gabapentin again at nighttime to help his nighttime left toe paresthesia  REVIEW OF SYSTEMS: Full 14 system review of systems performed and notable only for as palpitation, hearing loss, ringing ears, shortness of breath, wheezing, snoring, blurry vision, eye pain, joint pain, achy muscles  ALLERGIES: Allergies  Allergen Reactions  . Crestor [Rosuvastatin]     Nightmares in high dosage  . Sertraline Diarrhea  . Zocor [Simvastatin]     Nightmares in high dosage    HOME MEDICATIONS: Current Outpatient Prescriptions  Medication Sig Dispense Refill  . allopurinol (ZYLOPRIM) 100 MG tablet Take 1 tablet (100 mg total) by mouth daily. 30 tablet 3  . aspirin 325 MG tablet Take 325 mg by mouth daily.    . calcium carbonate (TUMS - DOSED IN MG ELEMENTAL CALCIUM) 500 MG chewable tablet Chew 2 tablets by mouth daily as needed. For indigestion.     . Coenzyme Q10-Vitamin E 100-1 MG-UNT/5ML SYRP Take 100 mg by mouth daily. Take 2 tablespoons (100MG ) Daily    . fish oil-omega-3 fatty acids 1000 MG capsule Take 1,200 mg by mouth daily. 4 1200  tablets daily    . fluticasone (FLONASE) 50 MCG/ACT nasal spray Place 1 spray into both nostrils daily as needed for allergies or rhinitis.    Marland Kitchen gabapentin (NEURONTIN) 100 MG capsule Take 1 capsule (100 mg total) by mouth at bedtime. 30 capsule 6  . glimepiride (AMARYL) 1 MG tablet Take 1 tablet (1 mg total) by mouth daily with breakfast. 30 tablet 1  . LORazepam (ATIVAN) 0.5 MG tablet TK 1/2 TO 1 T PO QD PRN  0  . losartan-hydrochlorothiazide (HYZAAR) 100-25 MG tablet TAKE 1 TABLET BY  MOUTH DAILY 90 tablet 3  . metFORMIN (GLUCOPHAGE-XR) 500 MG 24 hr tablet TAKE 1 TABLET BY MOUTH TWICE DAILY 180 tablet 0  . methocarbamol (ROBAXIN) 500 MG tablet Take 1 tablet (500 mg total) by mouth 3 (three) times daily. 30 tablet 0  . metoprolol tartrate (LOPRESSOR) 25 MG tablet TAKE 1 TABLET BY MOUTH TWICE DAILY 180 tablet 1  . rosuvastatin (CRESTOR) 20 MG tablet Take 20 mg by mouth every other day.    . traMADol (ULTRAM) 50 MG tablet TAKE 1 TABLET BY MOUTH EVERY 6 HOURS AS NEEDED 30 tablet 0   No current facility-administered medications for this visit.     PAST MEDICAL HISTORY: Past Medical History:  Diagnosis Date  . Anxiety   . Bunion 09/29/2011  . Cancer (HCC)    nose/Dr. Albertini;skin  . Chronic constipation   . Chronic kidney disease    stage 111  . Coronary atherosclerosis of native coronary artery    2009 LAD CIRC DES  . Depression    doesn't take any meds for this  . Diabetes mellitus    takes Januvia daily  . Dizziness   . Enlarged prostate    but doesn't require meds at present  . GERD (gastroesophageal reflux disease)    TUMS prn  . H/O hiatal hernia   . Headache(784.0)   . History of gout    doesn't require meds  . Hx of cardiac cath   . Hyperlipidemia    Crestor 3 x wk and Zetia daily  . Hypertension    takes Hyzaar daily  . Insomnia    doesn't require meds at present time  . Joint pain   . Onychomycosis 10/05/2012   x 10  . PONV (postoperative nausea and vomiting)    pt states extremely sick  . Shortness of breath    with exertion    PAST SURGICAL HISTORY: Past Surgical History:  Procedure Laterality Date  . bladder cancer    . CARDIAC CATHETERIZATION  2013  . cataracts removed    . CHOLECYSTECTOMY    . COLONOSCOPY    . CORONARY ANGIOPLASTY     2 stents from 2009  . CORONARY ARTERY BYPASS GRAFT  10/29/2011   Procedure: CORONARY ARTERY BYPASS GRAFTING (CABG);  Surgeon: Ivin Poot, MD;  Location: [facility];  Service: Open Heart Surgery;   Laterality: N/A;  . cyst removed from finger     right hand  . EYE SURGERY     bilateral cataracts  . HEMORRHOID SURGERY    . HERNIA REPAIR     x 2;inguinal  . KIDNEY STONE SURGERY    . LEFT HEART CATHETERIZATION WITH CORONARY ANGIOGRAM N/A 10/21/2011   Procedure: LEFT HEART CATHETERIZATION WITH CORONARY ANGIOGRAM;  Surgeon: Candee Furbish, MD;  Location: [facility] CATH LAB;  Service: Cardiovascular;  Laterality: N/A;  . rotator cuff surgery     right   . skin cancer removed  FAMILY HISTORY: Family History  Problem Relation Age of Onset  . Heart disease Mother   . Stroke Father   . Cancer Brother     SOCIAL HISTORY:  Social History   Social History  . Marital status: Married    Spouse name: N/A  . Number of children: 2  . Years of education: HS   Occupational History  . Retired    Social History Main Topics  . Smoking status: Former Smoker    Packs/day: 1.50    Years: 30.00    Types: Cigarettes    Quit date: 03/02/1984  . Smokeless tobacco: Never Used  . Alcohol use No  . Drug use: No  . Sexual activity: Not Currently   Other Topics Concern  . Not on file   Social History Narrative   Lives at home with his wife and great-granddaughter.   Right-handed.   Occasional caffeine use.     PHYSICAL EXAM   Vitals:   08/17/16 0847  BP: (!) 156/84  Pulse: 64  Weight: 186 lb (84.4 kg)  Height: 5\' 5"  (1.651 m)    Not recorded      Body mass index is 30.95 kg/m.  PHYSICAL EXAMNIATION:  Gen: NAD, conversant, well nourised, obese, well groomed                     Cardiovascular: Regular rate rhythm, no peripheral edema, warm, nontender. Eyes: Conjunctivae clear without exudates or hemorrhage Neck: Supple, no carotid bruits. Pulmonary: Clear to auscultation bilaterally   NEUROLOGICAL EXAM:  MENTAL STATUS: Speech:    Speech is normal; fluent and spontaneous with normal comprehension.  Cognition:     Orientation to time, place and person     Normal recent  and remote memory     Normal Attention span and concentration     Normal Language, naming, repeating,spontaneous speech     Fund of knowledge   CRANIAL NERVES: CN II: Visual fields are full to confrontation. Fundoscopic exam is normal with sharp discs and no vascular changes. Pupils are round equal and briskly reactive to light. CN III, IV, VI: extraocular movement are normal. No ptosis. CN V: Facial sensation is intact to pinprick in all 3 divisions bilaterally. Corneal responses are intact.  CN VII: Face is symmetric with normal eye closure and smile. CN VIII: Hearing is normal to rubbing fingers CN IX, X: Palate elevates symmetrically. Phonation is normal. CN XI: Head turning and shoulder shrug are intact CN XII: Tongue is midline with normal movements and no atrophy.  MOTOR: There is no pronator drift of out-stretched arms. Muscle bulk and tone are normal. Muscle strength is normal. With exception of mild left toe extension weakness  REFLEXES: Reflexes are 2+ and symmetric at the biceps, triceps, knees, and trace at ankles. Plantar responses are flexor.  SENSORY: He has mildly decreased vibratory sensation, pinprick light touch at the left toes, extending to the top of left foot, left medial leg  COORDINATION: Rapid alternating movements and fine finger movements are intact. There is no dysmetria on finger-to-nose and heel-knee-shin.    GAIT/STANCE: Posture is normal. Gait is steady with normal steps, base, arm swing, and turning. Heel and toe walking are normal. Tandem gait is normal.  Romberg is absent.   DIAGNOSTIC DATA (LABS, IMAGING, TESTING) - I reviewed patient records, labs, notes, testing and imaging myself where available.   ASSESSMENT AND PLAN  NIKAI QUEST is a 83 y.o. male   Left lumbar  radiculopathy  Mainly involving left L5 myotomes  Take gabapentin 100 mg 1-3 tablets at night night,  Return to clinic in 3 months, if he remains symptomatic, may  consider further studies such as EMG nerve conduction study,   Marcial Pacas, M.D. Ph.D.  [facility] [address], [place], [place] [address] Ph: [phone] Fax: [phone]  CC: Jessy Oto, MD, Leighton Ruff, MD

## 2016-08-18 ENCOUNTER — Ambulatory Visit (INDEPENDENT_AMBULATORY_CARE_PROVIDER_SITE_OTHER): Payer: Medicare Other | Admitting: Endocrinology

## 2016-08-18 ENCOUNTER — Encounter: Payer: Self-pay | Admitting: Endocrinology

## 2016-08-18 VITALS — BP 130/70 | HR 64 | Ht 65.0 in | Wt 188.0 lb

## 2016-08-18 DIAGNOSIS — E1165 Type 2 diabetes mellitus with hyperglycemia: Secondary | ICD-10-CM

## 2016-08-18 DIAGNOSIS — E041 Nontoxic single thyroid nodule: Secondary | ICD-10-CM

## 2016-08-18 MED ORDER — GLIMEPIRIDE 1 MG PO TABS: 1.0000 mg | ORAL_TABLET | Freq: Every day | ORAL | 1 refills | Status: DC

## 2016-08-18 NOTE — Progress Notes (Signed)
Patient ID: Cody Thomas, male   DOB: 10-29-1930, 83 y.o.   MRN: [id number]   Reason for Appointment: Diabetes follow-up   History of Present Illness   Diagnosis: Type 2 DIABETES MELITUS  He has had mild diabetes which has been previously erratic partly because of dietary inconsistency and difficulty with weight loss He has not been continued on metformin because of renal dysfunction Was given low-dose Januvia instead but this was too expensive for him even though it was helping his control In 2014 he was given low dose Amaryl instead which appears to be keeping his blood sugars controlled  His blood sugars were not well controlled when he was seen in 1/16 with A1c 7.8  Recent history:  Since 1/16 he has been on metformin and he is  taking 1000 mg daily without any side effects. Previously Amaryl was stopped previously because of occasional mild hypoglycemia but this was started back in 1/18 because of his A1c going up to 8%,   His A1c now 6.3%, Previous range 6. 2-8 percent . Current blood sugar patterns, problems and management:  He continues on Amaryl, taking a half of the 2 mg he has at home  Also taking metformin as directed 1000 mg a day  He has however had difficulty losing weight and has gained another 2 pounds  He thinks he is a little more active doing yardwork lately but may not be consistent with diet  He does report symptoms of hypoglycemia twice, lowest blood sugar 53.  This was when he was more active late afternoon and felt shaky  Did not bring his monitor for download and not clear what his blood sugar patterns are  He thinks his POSTPRANDIAL readings are no more than 180, highest 178 last night  Also he thinks his fasting readings are near normal, lab glucose 116 fasting            Monitors blood glucose: Once a day or less .    Glucometer:   Accu-Chek         Blood Glucose readings from recall: Range 53-1 78  Dietitian consultation last  done several years ago  Physical activity: exercise: yardwork   Wt Readings from Last 3 Encounters:  08/18/16 188 lb (85.3 kg)  08/17/16 186 lb (84.4 kg)  07/16/16 180 lb (81.6 kg)   DM LABS:  Lab Results  Component Value Date   HGBA1C 6.3 08/12/2016   HGBA1C 8.0 (H) 03/03/2016   HGBA1C 6.6 (H) 10/31/2015   Lab Results  Component Value Date   MICROALBUR 2.1 (H) 03/03/2016   LDLCALC 75 03/03/2016   CREATININE 1.36 08/12/2016   Lab on 08/12/2016  Component Date Value Ref Range Status  . Hgb A1c MFr Bld 08/12/2016 6.3  4.6 - 6.5 % Final   Glycemic Control Guidelines for People with Diabetes:Non Diabetic:  <6%Goal of Therapy: <7%Additional Action Suggested:  >8%   . Sodium 08/12/2016 139  135 - 145 mEq/L Final  . Potassium 08/12/2016 4.2  3.5 - 5.1 mEq/L Final  . Chloride 08/12/2016 103  96 - 112 mEq/L Final  . CO2 08/12/2016 30  19 - 32 mEq/L Final  . Glucose, Bld 08/12/2016 116* 70 - 99 mg/dL Final  . BUN 08/12/2016 20  6 - 23 mg/dL Final  . Creatinine, Ser 08/12/2016 1.36  0.40 - 1.50 mg/dL Final  . Total Bilirubin 08/12/2016 0.8  0.2 - 1.2 mg/dL Final  . Alkaline Phosphatase 08/12/2016 44  39 -  117 U/L Final  . AST 08/12/2016 22  0 - 37 U/L Final  . ALT 08/12/2016 20  0 - 53 U/L Final  . Total Protein 08/12/2016 6.9  6.0 - 8.3 g/dL Final  . Albumin 08/12/2016 4.2  3.5 - 5.2 g/dL Final  . Calcium 08/12/2016 9.8  8.4 - 10.5 mg/dL Final  . GFR 08/12/2016 53.08* >60.00 mL/min Final     Allergies as of 08/18/2016      Reactions   Crestor [rosuvastatin]    Nightmares in high dosage   Sertraline Diarrhea   Zocor [simvastatin]    Nightmares in high dosage      Medication List       Accurate as of 08/18/16  1:40 PM. Always use your most recent med list.          allopurinol 100 MG tablet Commonly known as:  ZYLOPRIM Take 1 tablet (100 mg total) by mouth daily.   aspirin 325 MG tablet Take 325 mg by mouth daily.   calcium carbonate 500 MG chewable  tablet Commonly known as:  TUMS - dosed in mg elemental calcium Chew 2 tablets by mouth daily as needed. For indigestion.   Coenzyme Q10-Vitamin E 100-1 MG-UNT/5ML Syrp Take 100 mg by mouth daily. Take 2 tablespoons (100MG ) Daily   fish oil-omega-3 fatty acids 1000 MG capsule Take 1,200 mg by mouth daily. 4 1200 tablets daily   fluticasone 50 MCG/ACT nasal spray Commonly known as:  FLONASE Place 1 spray into both nostrils daily as needed for allergies or rhinitis.   gabapentin 100 MG capsule Commonly known as:  NEURONTIN Take 1 capsule (100 mg total) by mouth at bedtime.   glimepiride 1 MG tablet Commonly known as:  AMARYL Take 1 tablet (1 mg total) by mouth daily with breakfast.   LORazepam 0.5 MG tablet Commonly known as:  ATIVAN TK 1/2 TO 1 T PO QD PRN   losartan-hydrochlorothiazide 100-25 MG tablet Commonly known as:  HYZAAR TAKE 1 TABLET BY MOUTH DAILY   metFORMIN 500 MG 24 hr tablet Commonly known as:  GLUCOPHAGE-XR TAKE 1 TABLET BY MOUTH TWICE DAILY   methocarbamol 500 MG tablet Commonly known as:  ROBAXIN Take 1 tablet (500 mg total) by mouth 3 (three) times daily.   metoprolol tartrate 25 MG tablet Commonly known as:  LOPRESSOR TAKE 1 TABLET BY MOUTH TWICE DAILY   rosuvastatin 20 MG tablet Commonly known as:  CRESTOR Take 20 mg by mouth every other day.   traMADol 50 MG tablet Commonly known as:  ULTRAM TAKE 1 TABLET BY MOUTH EVERY 6 HOURS AS NEEDED       Allergies:  Allergies  Allergen Reactions  . Crestor [Rosuvastatin]     Nightmares in high dosage  . Sertraline Diarrhea  . Zocor [Simvastatin]     Nightmares in high dosage    Past Medical History:  Diagnosis Date  . Anxiety   . Bunion 09/29/2011  . Cancer (HCC)    nose/Dr. Albertini;skin  . Chronic constipation   . Chronic kidney disease    stage 111  . Coronary atherosclerosis of native coronary artery    2009 LAD CIRC DES  . Depression    doesn't take any meds for this  .  Diabetes mellitus    takes Januvia daily  . Dizziness   . Enlarged prostate    but doesn't require meds at present  . GERD (gastroesophageal reflux disease)    TUMS prn  . H/O hiatal hernia   .  Headache(784.0)   . History of gout    doesn't require meds  . Hx of cardiac cath   . Hyperlipidemia    Crestor 3 x wk and Zetia daily  . Hypertension    takes Hyzaar daily  . Insomnia    doesn't require meds at present time  . Joint pain   . Onychomycosis 10/05/2012   x 10  . PONV (postoperative nausea and vomiting)    pt states extremely sick  . Shortness of breath    with exertion    Past Surgical History:  Procedure Laterality Date  . bladder cancer    . CARDIAC CATHETERIZATION  2013  . cataracts removed    . CHOLECYSTECTOMY    . COLONOSCOPY    . CORONARY ANGIOPLASTY     2 stents from 2009  . CORONARY ARTERY BYPASS GRAFT  10/29/2011   Procedure: CORONARY ARTERY BYPASS GRAFTING (CABG);  Surgeon: Ivin Poot, MD;  Location: [place];  Service: Open Heart Surgery;  Laterality: N/A;  . cyst removed from finger     right hand  . EYE SURGERY     bilateral cataracts  . HEMORRHOID SURGERY    . HERNIA REPAIR     x 2;inguinal  . KIDNEY STONE SURGERY    . LEFT HEART CATHETERIZATION WITH CORONARY ANGIOGRAM N/A 10/21/2011   Procedure: LEFT HEART CATHETERIZATION WITH CORONARY ANGIOGRAM;  Surgeon: Candee Furbish, MD;  Location: [facility] CATH LAB;  Service: Cardiovascular;  Laterality: N/A;  . rotator cuff surgery     right   . skin cancer removed      Family History  Problem Relation Age of Onset  . Heart disease Mother   . Stroke Father   . Cancer Brother     Social History:  reports that he quit smoking about 32 years ago. His smoking use included Cigarettes. He has a 45.00 pack-year smoking history. He has never used smokeless tobacco. He reports that he does not drink alcohol or use drugs.  Review of Systems:  HYPERTENSION: was fairly well-controlled on losartan HCT And blood  pressure appears high when he has his first measurement  RENAL dysfunction: This is Stable and mild  Lab Results  Component Value Date   CREATININE 1.36 08/12/2016   BUN 20 08/12/2016   NA 139 08/12/2016   K 4.2 08/12/2016   CL 103 08/12/2016   CO2 30 08/12/2016     HYPERLIPIDEMIA: The lipid abnormality consists of elevated LDL and low HDL treated with Statin drugs   Currently taking Crestor 20 mg  Also followed by PCP and cardiologist    Lab Results  Component Value Date   CHOL 148 03/03/2016   HDL 35.60 (L) 03/03/2016   LDLCALC 75 03/03/2016   TRIG 190.0 (H) 03/03/2016   CHOLHDL 4 03/03/2016    Thyroid nodule: The nodule Followed up on t ultrasound in December done by his PCP, this was stable He also has a 12 mm probably pseudo-nodule present     Examination:   BP 130/70 (BP Location: Right Arm, Cuff Size: Normal)   Pulse 64   Ht 5\' 5"  (1.651 m)   Wt 188 lb (85.3 kg)   SpO2 92%   BMI 31.28 kg/m   Body mass index is 31.28 kg/m.  .    ASSESSMENT/ PLAN:   Diabetes type 2 with  obesity See history of present illness for detailed discussion of current diabetes management, blood sugar patterns and problems identified  The patient's diabetes  control is improved with adding 1 mg Amaryl, and A1c is now 6.3 Previously A1c was 8% However he does appear to have occasional low blood sugars especially when he is more active and may be more right before his dinner time  Considering his age and borderline renal function will recommend that he take only 0.5 mg Amaryl instead of 1 mg, new prescription sent  He will bring his monitor for download on the next visit Continue metformin  HYPERTENSION: Well controlled     Patient Instructions  Check blood sugars on waking up    Also check blood sugars about 2 hours after a meal and do this after different meals by rotation  Recommended blood sugar levels on waking up is 90-130 and about 2 hours after meal is  130-180  Please bring your blood sugar monitor to each visit, thank you  Glimeperide 1/2 daily        Tanishka Drolet 08/18/2016, 1:40 PM

## 2016-08-18 NOTE — Patient Instructions (Signed)
Check blood sugars on waking up    Also check blood sugars about 2 hours after a meal and do this after different meals by rotation  Recommended blood sugar levels on waking up is 90-130 and about 2 hours after meal is 130-180  Please bring your blood sugar monitor to each visit, thank you  Glimeperide 1/2 daily

## 2016-08-20 ENCOUNTER — Encounter (INDEPENDENT_AMBULATORY_CARE_PROVIDER_SITE_OTHER): Payer: Self-pay | Admitting: Specialist

## 2016-08-20 ENCOUNTER — Ambulatory Visit (INDEPENDENT_AMBULATORY_CARE_PROVIDER_SITE_OTHER): Payer: Medicare Other | Admitting: Specialist

## 2016-08-20 VITALS — BP 152/86 | HR 61 | Ht 65.0 in | Wt 186.0 lb

## 2016-08-20 DIAGNOSIS — M48062 Spinal stenosis, lumbar region with neurogenic claudication: Secondary | ICD-10-CM

## 2016-08-20 DIAGNOSIS — M5416 Radiculopathy, lumbar region: Secondary | ICD-10-CM | POA: Diagnosis not present

## 2016-08-20 NOTE — Progress Notes (Addendum)
Office Visit Note   Patient: Cody Thomas           Date of Birth: 21-Dec-1930           MRN: [id number] Visit Date: 08/20/2016              Requested by: Leighton Ruff, MD [place], [place] [id number] PCP: Leighton Ruff, MD   Assessment & Plan: Visit Diagnoses:  1. Spinal stenosis of lumbar region with neurogenic claudication   2. Radiculopathy, lumbar region     Plan: Avoid bending, stooping and avoid lifting weights greater than 10 lbs. Avoid prolong standing and walking. Avoid frequent bending and stooping  No lifting greater than 10 lbs. May use ice or moist heat for pain. Weight loss is of benefit. Handicap license is approved. Vitamin B complex daily and Kreel oil or fish oil daily  Follow-Up Instructions: No Follow-up on file.   Orders:  No orders of the defined types were placed in this encounter.  No orders of the defined types were placed in this encounter.     Procedures: No procedures performed   Clinical Data: No additional findings.   Subjective: No chief complaint on file.   HPI  Review of Systems   Objective: Vital Signs: BP (!) 152/86 (BP Location: Left Arm, Patient Position: Sitting)   Pulse 61   Ht 5\' 5"  (1.651 m)   Wt 186 lb (84.4 kg)   BMI 30.95 kg/m   Physical Exam  Ortho Exam  Specialty Comments:  No specialty comments available.  Imaging: No results found.   PMFS History: Patient Active Problem List   Diagnosis Date Noted  . Degenerative disc disease, lumbar 02/13/2016  . Chronic left-sided low back pain with left-sided sciatica 02/13/2016  . Left thyroid nodule 01/21/2015  . Pulmonary nodules/lesions, multiple 10/30/2014  . Bladder cancer (Progress) 08/09/2014  . Type II or unspecified type diabetes mellitus without mention of complication, not stated as uncontrolled 10/20/2012  . Onychomycosis 10/05/2012  . Chest pain, unspecified 10/21/2011  . Hypertension   . Hyperlipidemia   . Hx  of cardiac cath   . Coronary atherosclerosis of native coronary artery    Past Medical History:  Diagnosis Date  . Anxiety   . Bunion 09/29/2011  . Cancer (HCC)    nose/Dr. Albertini;skin  . Chronic constipation   . Chronic kidney disease    stage 111  . Coronary atherosclerosis of native coronary artery    2009 LAD CIRC DES  . Depression    doesn't take any meds for this  . Diabetes mellitus    takes Januvia daily  . Dizziness   . Enlarged prostate    but doesn't require meds at present  . GERD (gastroesophageal reflux disease)    TUMS prn  . H/O hiatal hernia   . Headache(784.0)   . History of gout    doesn't require meds  . Hx of cardiac cath   . Hyperlipidemia    Crestor 3 x wk and Zetia daily  . Hypertension    takes Hyzaar daily  . Insomnia    doesn't require meds at present time  . Joint pain   . Onychomycosis 10/05/2012   x 10  . PONV (postoperative nausea and vomiting)    pt states extremely sick  . Shortness of breath    with exertion    Family History  Problem Relation Age of Onset  . Heart disease Mother   . Stroke  Father   . Cancer Brother     Past Surgical History:  Procedure Laterality Date  . bladder cancer    . CARDIAC CATHETERIZATION  2013  . cataracts removed    . CHOLECYSTECTOMY    . COLONOSCOPY    . CORONARY ANGIOPLASTY     2 stents from 2009  . CORONARY ARTERY BYPASS GRAFT  10/29/2011   Procedure: CORONARY ARTERY BYPASS GRAFTING (CABG);  Surgeon: Ivin Poot, MD;  Location: [place];  Service: Open Heart Surgery;  Laterality: N/A;  . cyst removed from finger     right hand  . EYE SURGERY     bilateral cataracts  . HEMORRHOID SURGERY    . HERNIA REPAIR     x 2;inguinal  . KIDNEY STONE SURGERY    . LEFT HEART CATHETERIZATION WITH CORONARY ANGIOGRAM N/A 10/21/2011   Procedure: LEFT HEART CATHETERIZATION WITH CORONARY ANGIOGRAM;  Surgeon: Candee Furbish, MD;  Location: [facility] CATH LAB;  Service: Cardiovascular;  Laterality: N/A;  .  rotator cuff surgery     right   . skin cancer removed     Social History   Occupational History  . Retired    Social History Main Topics  . Smoking status: Former Smoker    Packs/day: 1.50    Years: 30.00    Types: Cigarettes    Quit date: 03/02/1984  . Smokeless tobacco: Never Used  . Alcohol use No  . Drug use: No  . Sexual activity: Not Currently

## 2016-08-20 NOTE — Patient Instructions (Addendum)
Plan: Avoid bending, stooping and avoid lifting weights greater than 10 lbs. Avoid prolong standing and walking. Avoid frequent bending and stooping  No lifting greater than 10 lbs. May use ice or moist heat for pain. Weight loss is of benefit. Handicap license is approved. Vitamin B complex daily and Kreel oil or fish oil daily.

## 2016-08-26 ENCOUNTER — Ambulatory Visit: Payer: Medicare Other | Admitting: Podiatry

## 2016-08-31 ENCOUNTER — Other Ambulatory Visit: Payer: Self-pay | Admitting: Endocrinology

## 2016-08-31 ENCOUNTER — Other Ambulatory Visit: Payer: Self-pay | Admitting: Cardiology

## 2016-09-29 ENCOUNTER — Ambulatory Visit (INDEPENDENT_AMBULATORY_CARE_PROVIDER_SITE_OTHER): Payer: Medicare Other | Admitting: Podiatry

## 2016-09-29 ENCOUNTER — Encounter: Payer: Self-pay | Admitting: Podiatry

## 2016-09-29 VITALS — BP 171/93 | HR 60

## 2016-09-29 DIAGNOSIS — M79676 Pain in unspecified toe(s): Secondary | ICD-10-CM

## 2016-09-29 DIAGNOSIS — B351 Tinea unguium: Secondary | ICD-10-CM | POA: Diagnosis not present

## 2016-09-29 NOTE — Progress Notes (Signed)
Patient ID: Cody Thomas, male   DOB: 31-Mar-1930, 83 y.o.   MRN: [id number]    Subjective: This patient presents again for schedule visit complaining of painful toenails walking wearing shoes and request toenail debridement  Objective: Orientated 3 DP and PT pulses 2/4 bilaterally Capillary reflex immediate bilaterally Sensation to 10 g monofilament wire intact 5/5 bilaterally Vibratory sensation reactive bilaterally Ankle reflexes equal and reactive bilaterally No skin lesions bilaterally Atrophic skin bilaterally Absent hair growth bilaterally HAV bilaterally Manual motor testing dorsi flexion, plantar flexion 5/5 bilaterally The toenails are elongated, brittle, hypertrophic, incurvated, discolored and tender to direct palpation 6-10  Assessment: Symptomatic onychomycoses 6-10 Type II diabetic  Plan: Debrided toenails 10 mechanically and electrically without any bleeding  Reappoint 3 months

## 2016-09-29 NOTE — Patient Instructions (Signed)

## 2016-10-19 IMAGING — MR MR LUMBAR SPINE WO/W CM
4 of 7 series · 17 of 48 positions shown · IV contrast (multihance)
Comparison: CT of the abdomen and pelvis March 27, 2014 at 6650
hr

CLINICAL DATA: RIGHT back and flank pain, nausea. No radiculopathy.
Symptoms began yesterday while shoveling snow.

EXAM:
MRI LUMBAR SPINE WITHOUT AND WITH CONTRAST
TECHNIQUE: Multiplanar and multiecho pulse sequences of the lumbar spine were
obtained without and with intravenous contrast.
CONTRAST:  9mL MULTIHANCE GADOBENATE DIMEGLUMINE 529 MG/ML IV SOLN

[Series 3: T1 · sagittal · 4.0mm · 0.55mm/px · 3 of 14 slices shown (1 of 2)]
[im 1/14]
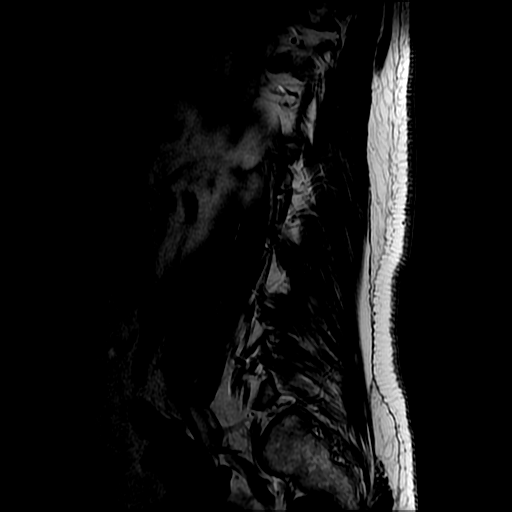
[im 7/14]
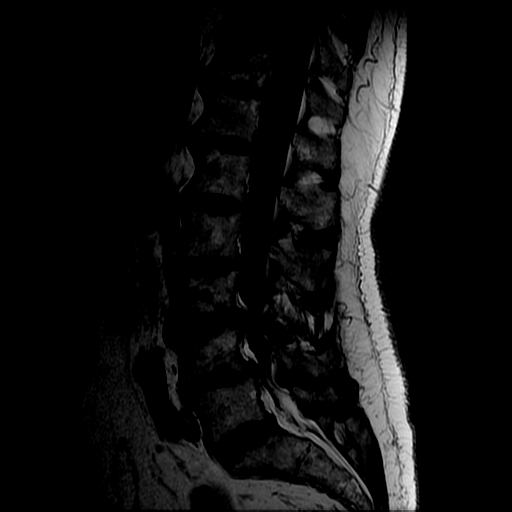
[im 14/14]
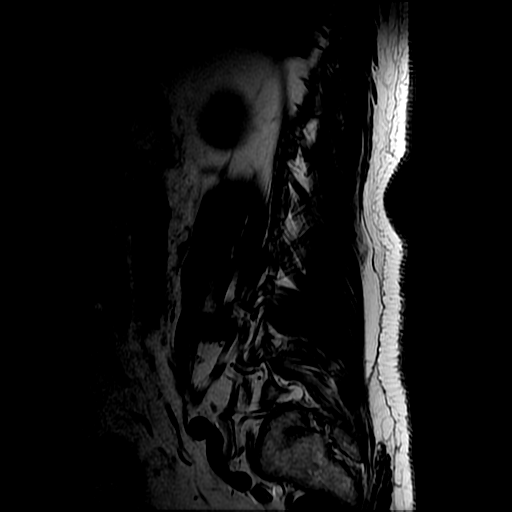

[Series 5: T2 · axial · 5.0mm · 0.39mm/px · z∈[-33,+142]mm · 8 of 40 slices shown]
[im 1/40]
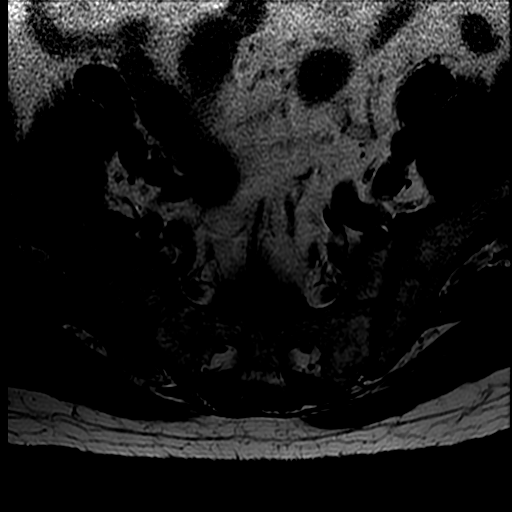
[im 4/40]
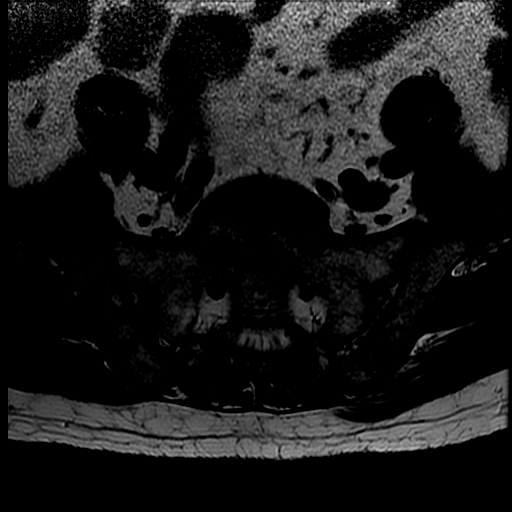
[im 8/40]
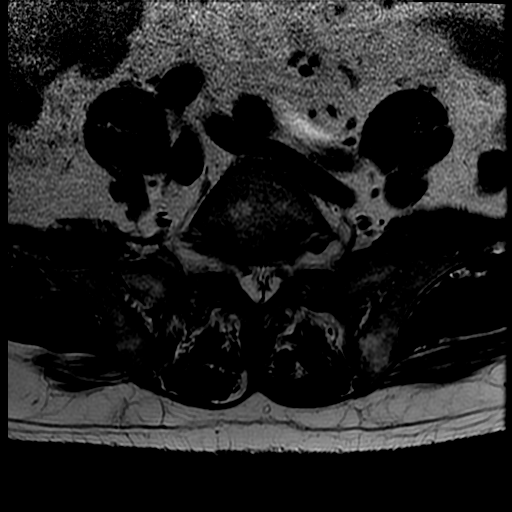
[im 12/40]
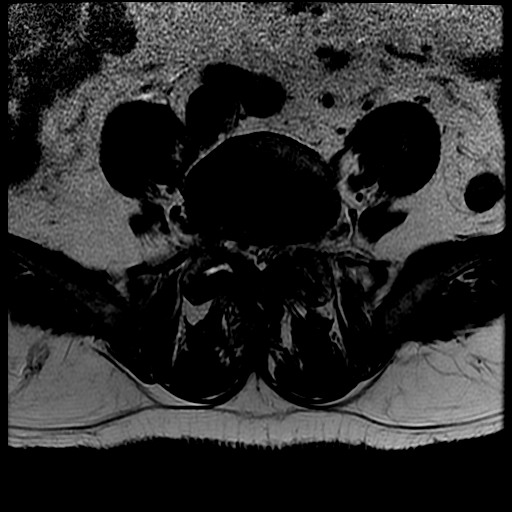
[im 16/40]
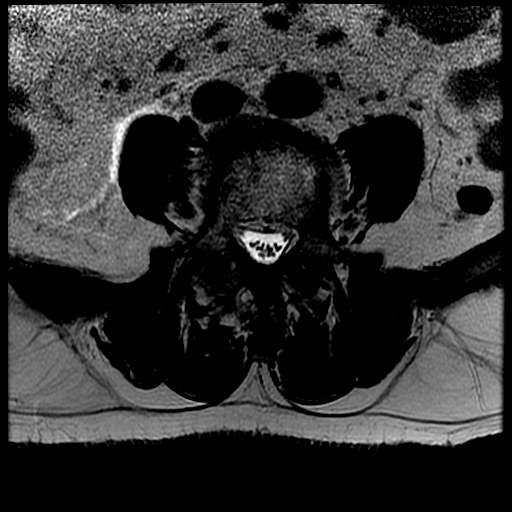
[im 20/40]
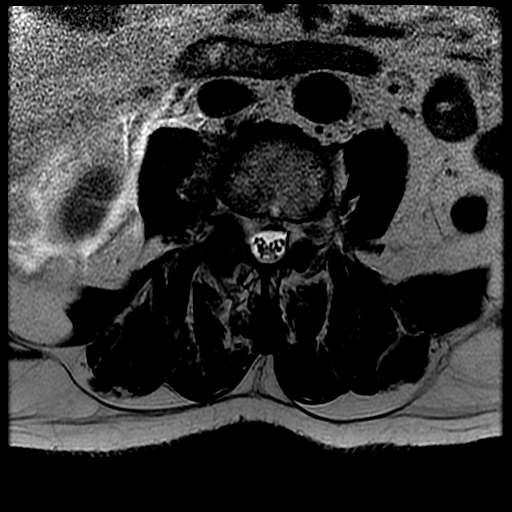
[im 24/40]
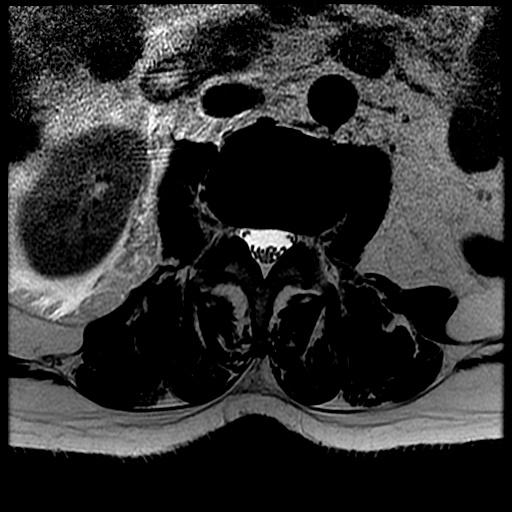
[im 36/40]
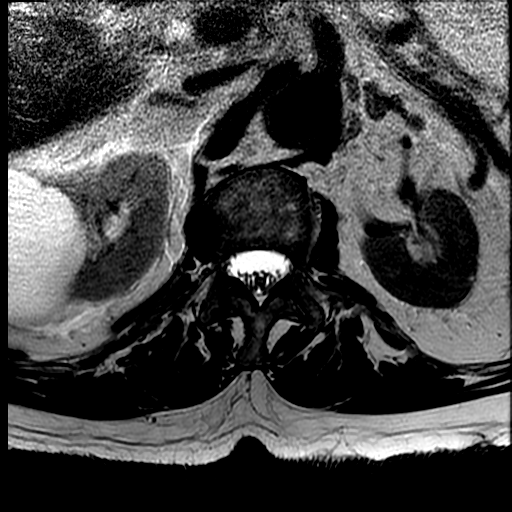

[Series 6: T1 · axial · 5.0mm · 0.39mm/px · z∈[-18,+142]mm · 3 of 40 slices shown (2 of 2)]
[im 4/40]
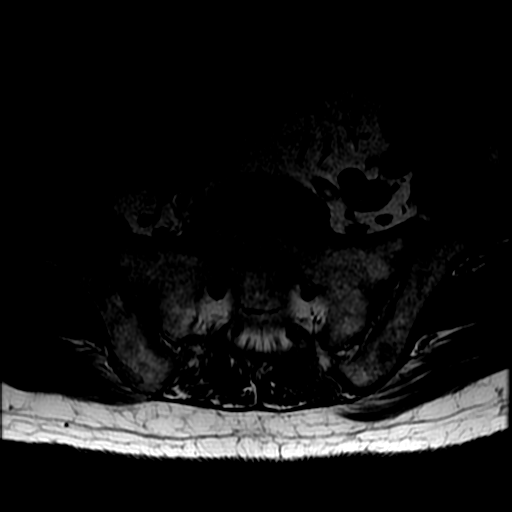
[im 20/40]
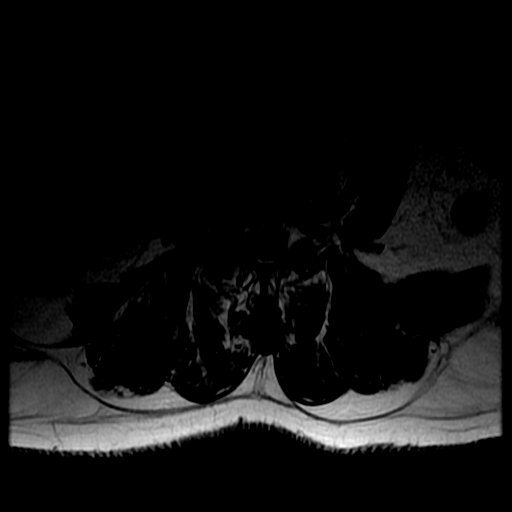
[im 36/40]
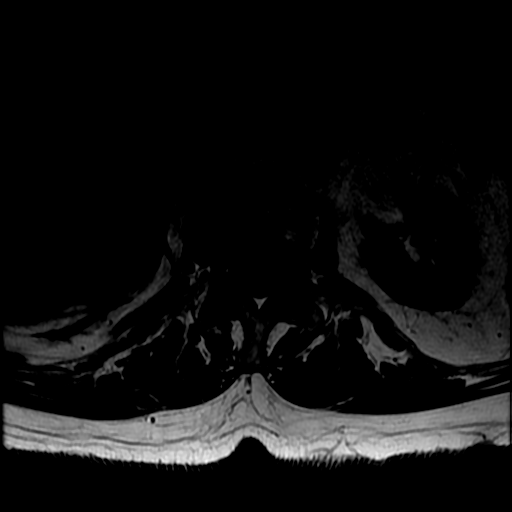

[Series 7: T2 post-contrast · sagittal · 4.0mm · 0.55mm/px · 3 of 14 slices shown]
[im 1/14]
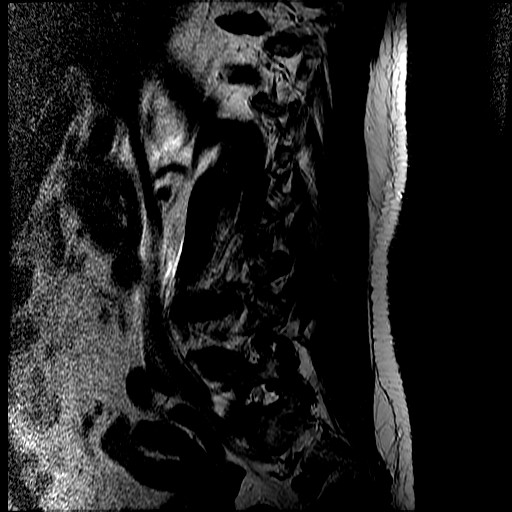
[im 9/14]
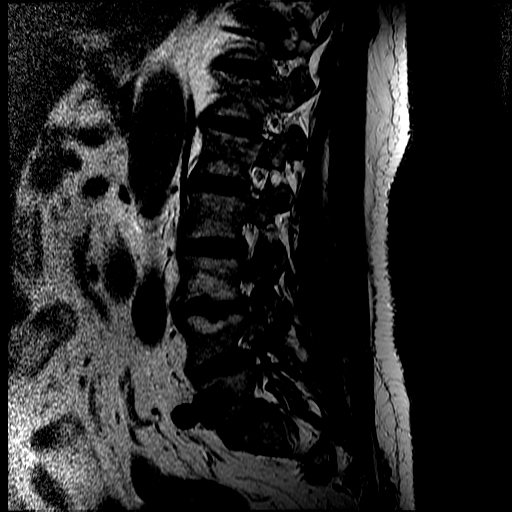
[im 14/14]
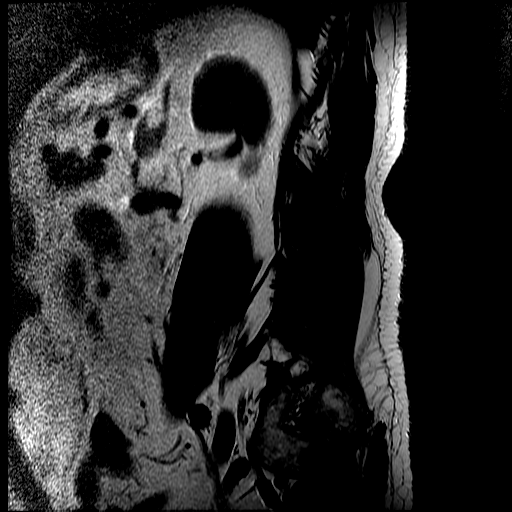

[17 of 48 positions shown; findings below may reference images not displayed]

FINDINGS: Lumbar vertebral bodies and posterior elements intact. Using the
reference level of the last well-formed intervertebral disc as
L5-S1, grade 1 L4-5 anterolisthesis. No spondylolysis. Moderate L3-4
disc height loss, moderate irregular discogenic endplate changes
with faint STIR signal. Mild associated enhancement, which does not
extend into the disc. Mild bright STIR signal within the disc.
Moderate L4-5 disc height loss, decreased T2 signal within all the
lumbar discs consistent with desiccation, vacuum disc L4-5 and
L5-S1.

Included view of the thoracic spine demonstrates acute on chronic
discogenic endplate changes T10-11 with mild enhancement. Additional
mildly enhancing Schmorl's nodes.

Conus medullaris terminates at L1 appears normal in morphology and
signal characteristics. Cauda equina is unremarkable. No abnormal
cord, leptomeningeal or epidural enhancement. Paraspinal soft
tissues are normal. Partially imaged RIGHT renal cysts and, mild
hydronephrosis.

Level by level evaluation:

T12-L1, L1-2: No disc bulge, canal stenosis or neural foraminal
narrowing.

L2-3: Annular bulging, mild facet arthropathy and ligamentum flavum
redundancy without canal stenosis. Mild bilateral caudal neural
foraminal narrowing.

L3-4: 4 mm broad-based disc bulge asymmetric to the RIGHT,
encroaches upon the bilateral exited L3 nerve . Severe RIGHT,
moderate to severe LEFT facet arthropathy and ligamentum flavum
redundancy result in mild-to-moderate canal stenosis, partial
effacement of RIGHT lateral recess likely affect the traversing
RIGHT L4 nerve. Moderate to severe RIGHT, mild LEFT neural foraminal
narrowing.

L4-5: Anterolisthesis. 5 mm broad-based disc bulge, severe facet
arthropathy and ligamentum flavum redundancy with trace facet
effusions which are likely reactive. Moderate canal stenosis
including partial effacement LEFT greater than RIGHT lateral
recesses which likely affect the traversing L5 nerves. Moderate
RIGHT, moderate to severe LEFT neural foraminal narrowing.

L5-S1: 3 mm broad-based disc bulge, moderate to severe facet
arthropathy and ligamentum flavum redundancy without canal stenosis.
Very mild neural foraminal narrowing.
IMPRESSION: Degenerative change of the lumbar spine, most conspicuous at T10-11
and L3-4 with acute on chronic changes. No MR findings of discitis
osteomyelitis. If clinical concern persists, recommend correlation
with ESR/ CRP.

Grade 1 L4-5 anterolisthesis on degenerative basis.  No fracture.

Moderate canal stenosis L4-5, mild to moderate at L3-4.

Neural foraminal narrowing L2-3 through L5-S1: Moderate to severe on
the RIGHT at L3-4 and on the LEFT at L4-5.

  By: Avril Bose

## 2016-10-26 ENCOUNTER — Ambulatory Visit: Payer: Medicare Other | Admitting: Pulmonary Disease

## 2016-10-29 ENCOUNTER — Ambulatory Visit (INDEPENDENT_AMBULATORY_CARE_PROVIDER_SITE_OTHER)
Admission: RE | Admit: 2016-10-29 | Discharge: 2016-10-29 | Disposition: A | Discharge status: Home / Self Care | Payer: Medicare Other | Source: Ambulatory Visit | Attending: Pulmonary Disease | Admitting: Pulmonary Disease

## 2016-10-29 DIAGNOSIS — R918 Other nonspecific abnormal finding of lung field: Secondary | ICD-10-CM

## 2016-11-04 DIAGNOSIS — L821 Other seborrheic keratosis: Secondary | ICD-10-CM | POA: Diagnosis not present

## 2016-11-04 DIAGNOSIS — L218 Other seborrheic dermatitis: Secondary | ICD-10-CM | POA: Diagnosis not present

## 2016-11-04 DIAGNOSIS — L814 Other melanin hyperpigmentation: Secondary | ICD-10-CM | POA: Diagnosis not present

## 2016-11-04 DIAGNOSIS — D1801 Hemangioma of skin and subcutaneous tissue: Secondary | ICD-10-CM | POA: Diagnosis not present

## 2016-11-09 ENCOUNTER — Ambulatory Visit (INDEPENDENT_AMBULATORY_CARE_PROVIDER_SITE_OTHER): Payer: Medicare Other | Admitting: Pulmonary Disease

## 2016-11-09 ENCOUNTER — Encounter: Payer: Self-pay | Admitting: Pulmonary Disease

## 2016-11-09 VITALS — BP 142/74 | HR 67 | Ht 65.0 in | Wt 187.2 lb

## 2016-11-09 DIAGNOSIS — Z23 Encounter for immunization: Secondary | ICD-10-CM | POA: Diagnosis not present

## 2016-11-09 DIAGNOSIS — R0600 Dyspnea, unspecified: Secondary | ICD-10-CM

## 2016-11-09 DIAGNOSIS — N320 Bladder-neck obstruction: Secondary | ICD-10-CM | POA: Diagnosis not present

## 2016-11-09 DIAGNOSIS — R400 Somnolence: Secondary | ICD-10-CM

## 2016-11-09 DIAGNOSIS — N183 Chronic kidney disease, stage 3 (moderate): Secondary | ICD-10-CM | POA: Diagnosis not present

## 2016-11-09 DIAGNOSIS — N200 Calculus of kidney: Secondary | ICD-10-CM | POA: Diagnosis not present

## 2016-11-09 DIAGNOSIS — C674 Malignant neoplasm of posterior wall of bladder: Secondary | ICD-10-CM | POA: Diagnosis not present

## 2016-11-09 DIAGNOSIS — R5383 Other fatigue: Secondary | ICD-10-CM | POA: Diagnosis not present

## 2016-11-09 MED ORDER — ZOLPIDEM TARTRATE 10 MG PO TABS: 10.0000 mg | ORAL_TABLET | Freq: Once | ORAL | 0 refills | Status: DC

## 2016-11-09 NOTE — Progress Notes (Signed)
Subjective:    Patient ID: Cody Thomas, male    DOB: 05-27-1930, 83 y.o.   MRN: [id number]  Synopsis: First seen by [facility] in August 2016 for pulmonary nodules. He has a personal history of bladder cancer. He was a heavy smoker prior to 89 when he quit.  He smoked 2 packs per day until then for several decades.   HPI Chief Complaint  Patient presents with  . Follow-up    review ct chest.  pt c/o worsening sob with exertion.     Cody Thomas says that every now and then he has a little shortness of breath.  He says that this will happen when he is exerting himself.  He is not limited by this right now.  He doesn't feel tightness or wheezing or pain when this happens.    He feels sleepy a lot.  He doesn't sleep well.  He doesn't feel well rested when he sleeps.  He typically sleeps 5-6 hours.  This has been happening since his open heart surgery in August 2013.    He is still on live BCG for his bladder cancer.    Past Medical History:  Diagnosis Date  . Anxiety   . Bunion 09/29/2011  . Cancer (HCC)    nose/Dr. Albertini;skin  . Chronic constipation   . Chronic kidney disease    stage 111  . Coronary atherosclerosis of native coronary artery    2009 LAD CIRC DES  . Depression    doesn't take any meds for this  . Diabetes mellitus    takes Januvia daily  . Dizziness   . Enlarged prostate    but doesn't require meds at present  . GERD (gastroesophageal reflux disease)    TUMS prn  . H/O hiatal hernia   . Headache(784.0)   . History of gout    doesn't require meds  . Hx of cardiac cath   . Hyperlipidemia    Crestor 3 x wk and Zetia daily  . Hypertension    takes Hyzaar daily  . Insomnia    doesn't require meds at present time  . Joint pain   . Onychomycosis 10/05/2012   x 10  . PONV (postoperative nausea and vomiting)    pt states extremely sick  . Shortness of breath    with exertion      Review of Systems  Constitutional: Positive for  fatigue. Negative for chills and fever.  HENT: Negative for sinus pain, sinus pressure and sneezing.   Respiratory: Positive for shortness of breath. Negative for cough and wheezing.   Cardiovascular: Negative for chest pain, palpitations and leg swelling.       Objective:   Physical Exam Vitals:   11/09/16 1611  BP: (!) 142/74  Pulse: 67  SpO2: 93%  Weight: 187 lb 3.2 oz (84.9 kg)  Height: 5\' 5"  (1.651 m)   Room air  Gen: elderly male appearing HENT: OP clear, TM's clear, neck supple PULM: CTA B, normal percussion CV: RRR, no mgr, trace edema GI: BS+, soft, nontender Derm: no cyanosis or rash Psyche: normal mood and affect   Chest imaging: CT chest images from last week were personally reviewed showing very small pulmonary nodules, the largest of which is 5 mm in the lingula no change compared to prior. CT chest 09/2016 : Stable nodules compared to the last image in 2017, Images independently reviewed, centrilobular emphysema seen.      Assessment & Plan:   Dyspnea, unspecified  type - Plan: Spirometry with Graph  Daytime somnolence - Plan: Polysomnography 4 or more parameters (NPSG)  Other fatigue - Plan: Polysomnography 4 or more parameters (NPSG)  Discussion: He does not need further CT scanning of his chest because the nodules have not grown which is a good thing. However, he describes some dyspnea today. The CT scan showed mild centrilobular emphysema. We performed simple spirometry which did not show evidence of COPD. Even though he has emphysema given his mild symptoms and lack of airflow obstruction I don't think he'll benefit from any sort of bronchodilator at this point. He does have significant fatigue and somnolence so we will arrange for a sleep study. He snored heavily in the past and he doesn't sleep well now. He has a high pretest probability of having obstructive sleep apnea.  Plan: For your shortness of breath: You have centrilobular emphysema but she  did not have COPD Get a flu shot every year Wash your hands frequently during the flu season You do not need inhalers for shortness of breath  For your fatigue and daytime somnolence: We will arrange for a sleep study We will prescribe ambien for the sleep study, take it that night only  For your pulmonary nodules: The most recent CT scan showed no growth you do not need further images  Follow-up with me or the NP in 4-6 weeks, after we have performed the sleep study   Current Outpatient Prescriptions:  .  aspirin 325 MG tablet, Take 325 mg by mouth daily., Disp: , Rfl:  .  b complex vitamins capsule, Take 1 capsule by mouth daily., Disp: , Rfl:  .  calcium carbonate (TUMS - DOSED IN MG ELEMENTAL CALCIUM) 500 MG chewable tablet, Chew 2 tablets by mouth daily as needed. For indigestion. , Disp: , Rfl:  .  Coenzyme Q10-Vitamin E 100-1 MG-UNT/5ML SYRP, Take 100 mg by mouth daily. Take 2 tablespoons (100MG ) Daily, Disp: , Rfl:  .  fish oil-omega-3 fatty acids 1000 MG capsule, Take 1,200 mg by mouth daily. 4 1200 tablets daily, Disp: , Rfl:  .  fluticasone (FLONASE) 50 MCG/ACT nasal spray, Place 1 spray into both nostrils daily as needed for allergies or rhinitis., Disp: , Rfl:  .  gabapentin (NEURONTIN) 100 MG capsule, Take 1 capsule (100 mg total) by mouth at bedtime., Disp: 30 capsule, Rfl: 6 .  glimepiride (AMARYL) 1 MG tablet, Take 1 tablet (1 mg total) by mouth daily with breakfast., Disp: 30 tablet, Rfl: 1 .  LORazepam (ATIVAN) 0.5 MG tablet, TK 1/2 TO 1 T PO QD PRN, Disp: , Rfl: 0 .  losartan-hydrochlorothiazide (HYZAAR) 100-25 MG tablet, TAKE 1 TABLET BY MOUTH DAILY, Disp: 90 tablet, Rfl: 3 .  metFORMIN (GLUCOPHAGE-XR) 500 MG 24 hr tablet, TAKE 1 TABLET BY MOUTH TWICE DAILY, Disp: 180 tablet, Rfl: 0 .  metoprolol tartrate (LOPRESSOR) 25 MG tablet, TAKE 1 TABLET BY MOUTH TWICE DAILY, Disp: 180 tablet, Rfl: 1 .  rosuvastatin (CRESTOR) 20 MG tablet, Take 20 mg by mouth every other day.,  Disp: , Rfl:  .  traMADol (ULTRAM) 50 MG tablet, TAKE 1 TABLET BY MOUTH EVERY 6 HOURS AS NEEDED, Disp: 30 tablet, Rfl: 0 .  zolpidem (AMBIEN) 10 MG tablet, Take 1 tablet (10 mg total) by mouth once., Disp: 1 tablet, Rfl: 0

## 2016-11-09 NOTE — Patient Instructions (Addendum)
For your shortness of breath: You have centrilobular emphysema but she did not have COPD Get a flu shot every year Wash your hands frequently during the flu season You do not need inhalers for shortness of breath  For your fatigue and daytime somnolence: We will arrange for a sleep study We will prescribe ambien for the sleep study, take it that night only  For your pulmonary nodules: The most recent CT scan showed no growth you do not need further images  Follow-up with me or the NP in 4-6 weeks, after we have performed the sleep study

## 2016-11-13 ENCOUNTER — Other Ambulatory Visit: Payer: Medicare Other

## 2016-11-18 ENCOUNTER — Ambulatory Visit: Payer: Medicare Other | Admitting: Endocrinology

## 2016-11-19 ENCOUNTER — Encounter: Payer: Self-pay | Admitting: Neurology

## 2016-11-19 ENCOUNTER — Ambulatory Visit (INDEPENDENT_AMBULATORY_CARE_PROVIDER_SITE_OTHER): Payer: Medicare Other | Admitting: Neurology

## 2016-11-19 VITALS — BP 148/79 | HR 58 | Ht 65.0 in | Wt 184.5 lb

## 2016-11-19 DIAGNOSIS — M5442 Lumbago with sciatica, left side: Secondary | ICD-10-CM

## 2016-11-19 DIAGNOSIS — G8929 Other chronic pain: Secondary | ICD-10-CM

## 2016-11-19 NOTE — Progress Notes (Signed)
PATIENT: Cody Thomas DOB: August 23, 1930  Chief Complaint  Patient presents with  . Back Pain    He is here with his wife, Cody Thomas.  He has continued to have back pain radiating into his left hip.  He is also still experiencing numbness in his left foot.  He has continued taking gabapentin 100mg  at bedtime.     HISTORICAL  Cody Thomas is 83 years old right-handed male, seen in refer by orthopedic surgeon Dr. Basil Dess E for evaluation of left foot swelling, and numbness, his primary care physician is Dr. Leighton Thomas, initial evaluation was on August 17 2016.  I reviewed and summarized referring note, he had past medical history of gout, is taking allopurinol 100 mg, most recent flareup is in April 2018, involving his left first toe, diabetes for many years, hypertension, hyperlipidemia, coronary artery disease, status post CABG history of depression anxiety, incidental finding of bladder cancer in 2016 while treatting kidney stone, had surgical removal, and irrigation chemotherapy.  He had a history of chronic low back pain, in 2012, he had a flareup of right side low back pain, radiating to left ower extremity. January 2018, he had a flareup of left-sided low back pain, radiating to left hip, along left posterior spine, to left calf, medial leg, and top of left foot, at the same time he experienced gout flareup of left toe.  His left leg discomfort has much improved with physical therapy, but now he had persistent left toe numbness, hypersensitive to touch, mild left ankle swelling, mild difficulty walking due to pain,  He denies bowel and bladder incontinence,  I have personally reviewed MRI lumbar in Jan 2016: Multilevel degenerative disc disease, most severe at left L4-5, moderate stenosis, mild to moderate stenosis at L 3 4, severe foraminal narrowing on the right side at L3-4, left side at L4-5,  He was given a prescription of gabapentin, but he took over-the-counter  supplement together with gabapentin has developed a rash, he is planning on to take gabapentin again at nighttime to help his nighttime left toe paresthesia  UPDATE Sept 20 2018: He still has left toe numbness, he has no gait difficulty, he has  mild left side low back pain, radiating pain to left hip, no bowel and bladder incontinence  We again personally reviewed MRI lumbar in 2016, multilevel degenerative disc disease, moderate canal stenosis L4-5, mild to moderate at L 3 4, multilevel foraminal narrowing, moderate to severe on the left at L4-5, right at L3-4,  REVIEW OF SYSTEMS: Full 14 system review of systems performed and notable only for as above.  ALLERGIES: Allergies  Allergen Reactions  . Crestor [Rosuvastatin]     Nightmares in high dosage  . Sertraline Diarrhea  . Zocor [Simvastatin]     Nightmares in high dosage    HOME MEDICATIONS: Current Outpatient Prescriptions  Medication Sig Dispense Refill  . aspirin 325 MG tablet Take 325 mg by mouth daily.    Marland Kitchen b complex vitamins capsule Take 1 capsule by mouth daily.    . calcium carbonate (TUMS - DOSED IN MG ELEMENTAL CALCIUM) 500 MG chewable tablet Chew 2 tablets by mouth daily as needed. For indigestion.     . Coenzyme Q10-Vitamin E 100-1 MG-UNT/5ML SYRP Take 100 mg by mouth daily. Take 2 tablespoons (100MG ) Daily    . fish oil-omega-3 fatty acids 1000 MG capsule Take 1,200 mg by mouth daily. 4 1200 tablets daily    . fluticasone (FLONASE) 50  MCG/ACT nasal spray Place 1 spray into both nostrils daily as needed for allergies or rhinitis.    Marland Kitchen gabapentin (NEURONTIN) 100 MG capsule Take 1 capsule (100 mg total) by mouth at bedtime. 30 capsule 6  . glimepiride (AMARYL) 1 MG tablet Take 1 tablet (1 mg total) by mouth daily with breakfast. 30 tablet 1  . LORazepam (ATIVAN) 0.5 MG tablet TK 1/2 TO 1 T PO QD PRN  0  . losartan-hydrochlorothiazide (HYZAAR) 100-25 MG tablet TAKE 1 TABLET BY MOUTH DAILY 90 tablet 3  . metFORMIN  (GLUCOPHAGE-XR) 500 MG 24 hr tablet TAKE 1 TABLET BY MOUTH TWICE DAILY 180 tablet 0  . metoprolol tartrate (LOPRESSOR) 25 MG tablet TAKE 1 TABLET BY MOUTH TWICE DAILY 180 tablet 1  . rosuvastatin (CRESTOR) 20 MG tablet Take 20 mg by mouth every other day.    . traMADol (ULTRAM) 50 MG tablet TAKE 1 TABLET BY MOUTH EVERY 6 HOURS AS NEEDED 30 tablet 0  . zolpidem (AMBIEN) 10 MG tablet Take 1 tablet (10 mg total) by mouth once. 1 tablet 0   No current facility-administered medications for this visit.     PAST MEDICAL HISTORY: Past Medical History:  Diagnosis Date  . Anxiety   . Bunion 09/29/2011  . Cancer (HCC)    nose/Dr. Albertini;skin  . Chronic constipation   . Chronic kidney disease    stage 111  . Coronary atherosclerosis of native coronary artery    2009 LAD CIRC DES  . Depression    doesn't take any meds for this  . Diabetes mellitus    takes Januvia daily  . Dizziness   . Enlarged prostate    but doesn't require meds at present  . GERD (gastroesophageal reflux disease)    TUMS prn  . H/O hiatal hernia   . Headache(784.0)   . History of gout    doesn't require meds  . Hx of cardiac cath   . Hyperlipidemia    Crestor 3 x wk and Zetia daily  . Hypertension    takes Hyzaar daily  . Insomnia    doesn't require meds at present time  . Joint pain   . Onychomycosis 10/05/2012   x 10  . PONV (postoperative nausea and vomiting)    pt states extremely sick  . Shortness of breath    with exertion    PAST SURGICAL HISTORY: Past Surgical History:  Procedure Laterality Date  . bladder cancer    . CARDIAC CATHETERIZATION  2013  . cataracts removed    . CHOLECYSTECTOMY    . COLONOSCOPY    . CORONARY ANGIOPLASTY     2 stents from 2009  . CORONARY ARTERY BYPASS GRAFT  10/29/2011   Procedure: CORONARY ARTERY BYPASS GRAFTING (CABG);  Surgeon: Ivin Poot, MD;  Location: [facility];  Service: Open Heart Surgery;  Laterality: N/A;  . cyst removed from finger     right hand    . EYE SURGERY     bilateral cataracts  . HEMORRHOID SURGERY    . HERNIA REPAIR     x 2;inguinal  . KIDNEY STONE SURGERY    . LEFT HEART CATHETERIZATION WITH CORONARY ANGIOGRAM N/A 10/21/2011   Procedure: LEFT HEART CATHETERIZATION WITH CORONARY ANGIOGRAM;  Surgeon: Candee Furbish, MD;  Location: [facility] CATH LAB;  Service: Cardiovascular;  Laterality: N/A;  . rotator cuff surgery     right   . skin cancer removed      FAMILY HISTORY: Family History  Problem  Relation Age of Onset  . Heart disease Mother   . Stroke Father   . Cancer Brother     SOCIAL HISTORY:  Social History   Social History  . Marital status: Married    Spouse name: N/A  . Number of children: 2  . Years of education: HS   Occupational History  . Retired    Social History Main Topics  . Smoking status: Former Smoker    Packs/day: 1.50    Years: 30.00    Types: Cigarettes    Quit date: 03/02/1984  . Smokeless tobacco: Never Used  . Alcohol use No  . Drug use: No  . Sexual activity: Not Currently   Other Topics Concern  . Not on file   Social History Narrative   Lives at home with his wife and great-granddaughter.   Right-handed.   Occasional caffeine use.     PHYSICAL EXAM   Vitals:   11/19/16 1150  BP: (!) 148/79  Pulse: (!) 58  Weight: 184 lb 8 oz (83.7 kg)  Height: 5\' 5"  (1.651 m)    Not recorded      Body mass index is 30.7 kg/m.  PHYSICAL EXAMNIATION:  Gen: NAD, conversant, well nourised, obese, well groomed                     Cardiovascular: Regular rate rhythm, no peripheral edema, warm, nontender. Eyes: Conjunctivae clear without exudates or hemorrhage Neck: Supple, no carotid bruits. Pulmonary: Clear to auscultation bilaterally   NEUROLOGICAL EXAM:  MENTAL STATUS: Speech:    Speech is normal; fluent and spontaneous with normal comprehension.  Cognition:     Orientation to time, place and person     Normal recent and remote memory     Normal Attention span and  concentration     Normal Language, naming, repeating,spontaneous speech     Fund of knowledge   CRANIAL NERVES: CN II: Visual fields are full to confrontation. Pupils are round equal and briskly reactive to light. CN III, IV, VI: extraocular movement are normal. No ptosis. CN V: Facial sensation is intact to pinprick in all 3 divisions bilaterally. Corneal responses are intact.  CN VII: Face is symmetric with normal eye closure and smile. CN VIII: Hearing is normal to rubbing fingers CN IX, X: Palate elevates symmetrically. Phonation is normal. CN XI: Head turning and shoulder shrug are intact CN XII: Tongue is midline with normal movements and no atrophy.  MOTOR: There is no pronator drift of out-stretched arms. Muscle bulk and tone are normal. Muscle strength is normal. With exception of mild left toe extension weakness  REFLEXES: Reflexes are 2+ and symmetric at the biceps, triceps, knees, and trace at ankles. Plantar responses are flexor.  SENSORY: He has mildly decreased vibratory sensation, pinprick light touch at the left toes, extending to the top of left foot, left medial leg  COORDINATION: Rapid alternating movements and fine finger movements are intact. There is no dysmetria on finger-to-nose and heel-knee-shin.    GAIT/STANCE: Posture is normal. Gait is steady with normal steps, base, arm swing, and turning. Heel and toe walking are normal. Tandem gait is normal.  Romberg is absent.   DIAGNOSTIC DATA (LABS, IMAGING, TESTING) - I reviewed patient records, labs, notes, testing and imaging myself where available.   ASSESSMENT AND PLAN  VEDANTH SIRICO is a 83 y.o. male   Left lumbar radiculopathy  Mainly involving left L5 myotomes  Keep gabapentin 100 mg 1-3 tablets  at night night,  Back stretching exercise.  May consider epidural injection if he has recurrent worsening low back  Cody Thomas, M.D. Ph.D.  [facility] [address] [place], [place] [address] Ph: [phone] Fax: [phone]  CC: Cody Oto, MD, Cody Ruff, MD

## 2016-11-20 ENCOUNTER — Ambulatory Visit (INDEPENDENT_AMBULATORY_CARE_PROVIDER_SITE_OTHER): Payer: Medicare Other | Admitting: Specialist

## 2016-12-03 ENCOUNTER — Ambulatory Visit (HOSPITAL_BASED_OUTPATIENT_CLINIC_OR_DEPARTMENT_OTHER): Payer: Medicare Other | Attending: Pulmonary Disease | Admitting: Pulmonary Disease

## 2016-12-03 DIAGNOSIS — R5383 Other fatigue: Secondary | ICD-10-CM | POA: Insufficient documentation

## 2016-12-03 DIAGNOSIS — E119 Type 2 diabetes mellitus without complications: Secondary | ICD-10-CM | POA: Insufficient documentation

## 2016-12-03 DIAGNOSIS — G4736 Sleep related hypoventilation in conditions classified elsewhere: Secondary | ICD-10-CM | POA: Insufficient documentation

## 2016-12-03 DIAGNOSIS — G4730 Sleep apnea, unspecified: Secondary | ICD-10-CM | POA: Diagnosis present

## 2016-12-03 DIAGNOSIS — G4733 Obstructive sleep apnea (adult) (pediatric): Secondary | ICD-10-CM | POA: Insufficient documentation

## 2016-12-03 DIAGNOSIS — Z79899 Other long term (current) drug therapy: Secondary | ICD-10-CM | POA: Insufficient documentation

## 2016-12-03 DIAGNOSIS — R0683 Snoring: Secondary | ICD-10-CM | POA: Diagnosis not present

## 2016-12-03 DIAGNOSIS — R400 Somnolence: Secondary | ICD-10-CM

## 2016-12-09 ENCOUNTER — Other Ambulatory Visit: Payer: Self-pay | Admitting: Endocrinology

## 2016-12-11 DIAGNOSIS — G4733 Obstructive sleep apnea (adult) (pediatric): Secondary | ICD-10-CM | POA: Diagnosis not present

## 2016-12-11 NOTE — Procedures (Signed)
Patient Name: Cody Thomas, Cody Thomas Date: 12/03/2016 Gender: Male D.O.B: March 30, 1930 Age (years): 56 Referring Provider: Simonne Maffucci Height (inches): 79 Interpreting Physician: Kara Mead MD, ABSM Weight (lbs): 184 RPSGT: Baxter Flattery BMI: 33 MRN: [id number] Neck Size: 16.50   CLINICAL INFORMATION Sleep Study Type: NPSG  Indication for sleep study: Diabetes, Fatigue, Snoring, Witnesses Apnea / Gasping During Sleep  Epworth Sleepiness Score: 6  SLEEP STUDY TECHNIQUE As per the AASM Manual for the Scoring of Sleep and Associated Events v2.3 (April 2016) with a hypopnea requiring 4% desaturations.  The channels recorded and monitored were frontal, central and occipital EEG, electrooculogram (EOG), submentalis EMG (chin), nasal and oral airflow, thoracic and abdominal wall motion, anterior tibialis EMG, snore microphone, electrocardiogram, and pulse oximetry.  MEDICATIONS Medications self-administered by patient taken the night of the study : LORAZEPAM  SLEEP ARCHITECTURE The study was initiated at 9:57:40 PM and ended at 4:57:00 AM.  Sleep onset time was 87.3 minutes and the sleep efficiency was 70.2%. The total sleep time was 294.5 minutes.  Stage REM latency was 60.5 minutes.  The patient spent 4.41% of the night in stage N1 sleep, 76.91% in stage N2 sleep, 0.00% in stage N3 and 18.68% in REM.  Alpha intrusion was absent.  Supine sleep was 7.79%.  RESPIRATORY PARAMETERS The overall apnea/hypopnea index (AHI) was 7.3 per hour. There were 1 total apneas, including 1 obstructive, 0 central and 0 mixed apneas. There were 35 hypopneas and 0 RERAs.  The AHI during Stage REM sleep was 6.5 per hour.  AHI while supine was 60.1 per hour.  The mean oxygen saturation was 89.58%. The minimum SpO2 during sleep was 82.00%.  loud snoring was noted during this study.  CARDIAC DATA The 2 lead EKG demonstrated sinus rhythm. The mean heart rate was 63.69 beats per minute. Other  EKG findings include: None.   LEG MOVEMENT DATA The total PLMS were 0 with a resulting PLMS index of 0.00. Associated arousal with leg movement index was 0.0 .  IMPRESSIONS - Mild obstructive sleep apnea occurred during this study (AHI = 7.3/h). - No significant central sleep apnea occurred during this study (CAI = 0.0/h). - Moderate oxygen desaturation was noted during this study (Min O2 = 82.00%). 1 L O2 was applied - The patient snored with loud snoring volume. - No cardiac abnormalities were noted during this study. - Clinically significant periodic limb movements did not occur during sleep. No significant associated arousals.   DIAGNOSIS - Obstructive Sleep Apnea (327.23 [G47.33 ICD-10]) - Nocturnal Hypoxemia (327.26 [G47.36 ICD-10])   RECOMMENDATIONS - Positional therapy avoiding supine position during sleep. - Very mild obstructive sleep apnea. The cardiovascular significance of this degree of sleep disordered breathing is debatable. Treatment options can include positional therapy alone or CPAP/ oral appliance if very symptomatic - 1L O2 during sleep - Avoid alcohol, sedatives and other CNS depressants that may worsen sleep apnea and disrupt normal sleep architecture. - Sleep hygiene should be reviewed to assess factors that may improve sleep quality. - Weight management and regular exercise should be initiated or continued if appropriate.   Kara Mead MD Board Certified in [place]

## 2016-12-14 ENCOUNTER — Ambulatory Visit (INDEPENDENT_AMBULATORY_CARE_PROVIDER_SITE_OTHER): Payer: Medicare Other | Admitting: Adult Health

## 2016-12-14 ENCOUNTER — Encounter: Payer: Self-pay | Admitting: Adult Health

## 2016-12-14 VITALS — BP 150/80 | HR 59 | Ht 66.0 in | Wt 186.2 lb

## 2016-12-14 DIAGNOSIS — G4733 Obstructive sleep apnea (adult) (pediatric): Secondary | ICD-10-CM | POA: Insufficient documentation

## 2016-12-14 DIAGNOSIS — E669 Obesity, unspecified: Secondary | ICD-10-CM | POA: Insufficient documentation

## 2016-12-14 NOTE — Assessment & Plan Note (Signed)
Mild OSA   Plan  Patient Instructions  Begin CPAP At bedtime  .  Wear for at least 4-6hr each night.  Work on healthy weight .  Do not drive if sleepy .  Follow up with Dr. Lake Bells or Jameka Ivie NP in 6-8 weeks

## 2016-12-14 NOTE — Progress Notes (Signed)
@Patient  ID: Cody Thomas, male    DOB: 23-Aug-1930, 83 y.o.   MRN: [id number]  Chief Complaint  Patient presents with  . Follow-up    OSA     Referring provider: Leighton Ruff, MD  HPI: 83 year old male former smoker followed for pulmonary nodules and sleep apnea (dx 11/2016)  Hx of Bladder cancer , CAD s/p CABG   TEST  Chest imaging: CT chest images from last week were personally reviewed showing very small pulmonary nodules, the largest of which is 5 mm in the lingula no change compared to prior. CT chest 09/2016 : Stable nodules compared to the last image in 2017, Images independently reviewed, centrilobular emphysema seen.  12/14/2016 Follow up : OSA  Patient presents for a one-month follow-up. Patient was seen last visit complain of daytime fatigue, sleepiness. He was set up for a sleep study that showed mild sleep apnea with AHI 7.3/h. No significant central Events. Moderate oxygen desaturation at 82%. Loud snoring. AHI while supine was 60/hr. . Patient has a history of coronary artery disease. We discussed his sleep study results and treatment options including positional therapy C Pap weight loss. Patient would like to proceed with C Pap at bedtime..   Allergies  Allergen Reactions  . Crestor [Rosuvastatin]     Nightmares in high dosage  . Sertraline Diarrhea  . Zocor [Simvastatin]     Nightmares in high dosage    Immunization History  Administered Date(s) Administered  . Influenza, High Dose Seasonal PF 11/09/2016  . Influenza,inj,Quad PF,6+ Mos 01/19/2013  . Influenza-Unspecified 12/19/2013, 12/21/2014  . Pneumococcal Conjugate-13 10/29/2012  . Pneumococcal Polysaccharide-23 10/29/2009    Past Medical History:  Diagnosis Date  . Anxiety   . Bunion 09/29/2011  . Cancer (HCC)    nose/Dr. Albertini;skin  . Chronic constipation   . Chronic kidney disease    stage 111  . Coronary atherosclerosis of native coronary artery    2009 LAD CIRC DES  .  Depression    doesn't take any meds for this  . Diabetes mellitus    takes Januvia daily  . Dizziness   . Enlarged prostate    but doesn't require meds at present  . GERD (gastroesophageal reflux disease)    TUMS prn  . H/O hiatal hernia   . Headache(784.0)   . History of gout    doesn't require meds  . Hx of cardiac cath   . Hyperlipidemia    Crestor 3 x wk and Zetia daily  . Hypertension    takes Hyzaar daily  . Insomnia    doesn't require meds at present time  . Joint pain   . Onychomycosis 10/05/2012   x 10  . PONV (postoperative nausea and vomiting)    pt states extremely sick  . Shortness of breath    with exertion    Tobacco History: History  Smoking Status  . Former Smoker  . Packs/day: 1.50  . Years: 30.00  . Types: Cigarettes  . Quit date: 03/02/1984  Smokeless Tobacco  . Never Used   Counseling given: Not Answered   Outpatient Encounter Prescriptions as of 12/14/2016  Medication Sig  . aspirin 325 MG tablet Take 325 mg by mouth daily.  Marland Kitchen b complex vitamins capsule Take 1 capsule by mouth daily.  . calcium carbonate (TUMS - DOSED IN MG ELEMENTAL CALCIUM) 500 MG chewable tablet Chew 2 tablets by mouth daily as needed. For indigestion.   . Coenzyme Q10-Vitamin E 100-1 MG-UNT/5ML SYRP Take  100 mg by mouth daily. Take 2 tablespoons (100MG ) Daily  . fish oil-omega-3 fatty acids 1000 MG capsule Take 1,200 mg by mouth daily. 4 1200 tablets daily  . fluticasone (FLONASE) 50 MCG/ACT nasal spray Place 1 spray into both nostrils daily as needed for allergies or rhinitis.  Marland Kitchen gabapentin (NEURONTIN) 100 MG capsule Take 1 capsule (100 mg total) by mouth at bedtime.  Marland Kitchen glimepiride (AMARYL) 1 MG tablet Take 1 tablet (1 mg total) by mouth daily with breakfast.  . LORazepam (ATIVAN) 0.5 MG tablet TK 1/2 TO 1 T PO QD PRN  . losartan-hydrochlorothiazide (HYZAAR) 100-25 MG tablet TAKE 1 TABLET BY MOUTH DAILY  . metFORMIN (GLUCOPHAGE-XR) 500 MG 24 hr tablet TAKE 1 TABLET BY  MOUTH TWICE DAILY  . metoprolol tartrate (LOPRESSOR) 25 MG tablet TAKE 1 TABLET BY MOUTH TWICE DAILY  . rosuvastatin (CRESTOR) 20 MG tablet Take 20 mg by mouth every other day.  . traMADol (ULTRAM) 50 MG tablet TAKE 1 TABLET BY MOUTH EVERY 6 HOURS AS NEEDED  . zolpidem (AMBIEN) 10 MG tablet Take 1 tablet (10 mg total) by mouth once.   No facility-administered encounter medications on file as of 12/14/2016.      Review of Systems  Constitutional:   No  weight loss, night sweats,  Fevers, chills,  +fatigue, or  lassitude.  HEENT:   No headaches,  Difficulty swallowing,  Tooth/dental problems, or  Sore throat,                No sneezing, itching, ear ache, nasal congestion, post nasal drip,   CV:  No chest pain,  Orthopnea, PND, swelling in lower extremities, anasarca, dizziness, palpitations, syncope.   GI  No heartburn, indigestion, abdominal pain, nausea, vomiting, diarrhea, change in bowel habits, loss of appetite, bloody stools.   Resp: No shortness of breath with exertion or at rest.  No excess mucus, no productive cough,  No non-productive cough,  No coughing up of blood.  No change in color of mucus.  No wheezing.  No chest wall deformity  Skin: no rash or lesions.  GU: no dysuria, change in color of urine, no urgency or frequency.  No flank pain, no hematuria   MS:  No joint pain or swelling.  No decreased range of motion.  No back pain.    Physical Exam  BP (!) 150/80 (BP Location: Right Arm, Patient Position: Sitting, Cuff Size: Normal)   Pulse (!) 59   Ht 5\' 6"  (1.676 m)   Wt 186 lb 3.2 oz (84.5 kg)   SpO2 92%   BMI 30.05 kg/m   GEN: A/Ox3; pleasant , NAD, elderly    HEENT:  [place]/AT,  EACs-clear, TMs-wnl, NOSE-clear, THROAT-clear, no lesions, no postnasal drip or exudate noted. Class 2 MP airway   NECK:  Supple w/ fair ROM; no JVD; normal carotid impulses w/o bruits; no thyromegaly or nodules palpated; no lymphadenopathy.    RESP  Clear  P & A; w/o, wheezes/  rales/ or rhonchi. no accessory muscle use, no dullness to percussion  CARD:  RRR, no m/r/g, no peripheral edema, pulses intact, no cyanosis or clubbing.  GI:   Soft & nt; nml bowel sounds; no organomegaly or masses detected.   Musco: Warm bil, no deformities or joint swelling noted.   Neuro: alert, no focal deficits noted.    Skin: Warm, no lesions or rashes    Lab Results:   BNP No results found for: BNP  ProBNP No results found  for: PROBNP  Imaging: No results found.   Assessment & Plan:   OSA (obstructive sleep apnea) Mild OSA   Plan  Patient Instructions  Begin CPAP At bedtime  .  Wear for at least 4-6hr each night.  Work on healthy weight .  Do not drive if sleepy .  Follow up with Dr. Lake Bells or Haelyn Forgey NP in 6-8 weeks      Obese Wt loss      Rexene Edison, NP 12/14/2016

## 2016-12-14 NOTE — Assessment & Plan Note (Signed)
Wt loss  

## 2016-12-14 NOTE — Patient Instructions (Addendum)
Begin CPAP At bedtime  .  Wear for at least 4-6hr each night.  Work on healthy weight .  Do not drive if sleepy .  Follow up with Dr. Lake Bells or Jermani Eberlein NP in 6-8 weeks

## 2016-12-14 NOTE — Progress Notes (Signed)
Reviewed, agree 

## 2016-12-21 ENCOUNTER — Other Ambulatory Visit (INDEPENDENT_AMBULATORY_CARE_PROVIDER_SITE_OTHER): Payer: Medicare Other

## 2016-12-21 DIAGNOSIS — E041 Nontoxic single thyroid nodule: Secondary | ICD-10-CM | POA: Diagnosis not present

## 2016-12-21 DIAGNOSIS — E1165 Type 2 diabetes mellitus with hyperglycemia: Secondary | ICD-10-CM

## 2016-12-21 LAB — BASIC METABOLIC PANEL
BUN: 23 mg/dL (ref 6–23)
CO2: 28 meq/L (ref 19–32)
Calcium: 9.9 mg/dL (ref 8.4–10.5)
Chloride: 101 mEq/L (ref 96–112)
Creatinine, Ser: 1.37 mg/dL (ref 0.40–1.50)
GFR: 52.59 mL/min — ABNORMAL LOW (ref >60.00)
Glucose, Bld: 186 mg/dL — ABNORMAL HIGH (ref 70–99)
Potassium: 4 meq/L (ref 3.5–5.1)
Sodium: 138 meq/L (ref 135–145)

## 2016-12-21 LAB — HEMOGLOBIN A1C: Hgb A1c MFr Bld: 6.3 % (ref 4.6–6.5)

## 2016-12-21 LAB — TSH: TSH: 1.89 u[IU]/mL (ref 0.35–4.50)

## 2016-12-24 ENCOUNTER — Ambulatory Visit (INDEPENDENT_AMBULATORY_CARE_PROVIDER_SITE_OTHER): Payer: Medicare Other | Admitting: Endocrinology

## 2016-12-24 ENCOUNTER — Ambulatory Visit (INDEPENDENT_AMBULATORY_CARE_PROVIDER_SITE_OTHER): Payer: Medicare Other

## 2016-12-24 ENCOUNTER — Encounter: Payer: Self-pay | Admitting: Endocrinology

## 2016-12-24 ENCOUNTER — Encounter (INDEPENDENT_AMBULATORY_CARE_PROVIDER_SITE_OTHER): Payer: Self-pay | Admitting: Specialist

## 2016-12-24 ENCOUNTER — Ambulatory Visit (INDEPENDENT_AMBULATORY_CARE_PROVIDER_SITE_OTHER): Payer: Medicare Other | Admitting: Specialist

## 2016-12-24 VITALS — BP 145/75 | HR 66 | Ht 65.0 in | Wt 186.0 lb

## 2016-12-24 VITALS — BP 146/84 | HR 59 | Ht 66.0 in | Wt 186.4 lb

## 2016-12-24 DIAGNOSIS — E041 Nontoxic single thyroid nodule: Secondary | ICD-10-CM | POA: Diagnosis not present

## 2016-12-24 DIAGNOSIS — S92525A Nondisplaced fracture of medial phalanx of left lesser toe(s), initial encounter for closed fracture: Secondary | ICD-10-CM

## 2016-12-24 DIAGNOSIS — M48062 Spinal stenosis, lumbar region with neurogenic claudication: Secondary | ICD-10-CM | POA: Diagnosis not present

## 2016-12-24 DIAGNOSIS — S99922A Unspecified injury of left foot, initial encounter: Secondary | ICD-10-CM

## 2016-12-24 DIAGNOSIS — E1165 Type 2 diabetes mellitus with hyperglycemia: Secondary | ICD-10-CM | POA: Diagnosis not present

## 2016-12-24 MED ORDER — TRAMADOL HCL 50 MG PO TABS: 50.0000 mg | ORAL_TABLET | Freq: Four times a day (QID) | ORAL | 0 refills | Status: DC | PRN

## 2016-12-24 NOTE — Progress Notes (Signed)
Patient ID: Cody Thomas, male   DOB: 01-25-1931, 83 y.o.   MRN: [id number]   Reason for Appointment: Diabetes follow-up   History of Present Illness   Diagnosis: Type 2 DIABETES MELITUS  He has had mild diabetes which has been previously erratic partly because of dietary inconsistency and difficulty with weight loss He has not been continued on metformin because of renal dysfunction Was given low-dose Januvia instead but this was too expensive for him even though it was helping his control In 2014 he was given low dose Amaryl instead which appears to be keeping his blood sugars controlled  His blood sugars were not well controlled when he was seen in 1/16 with A1c 7.8  Recent history:  Since 1/16 he has been on metformin and he is  taking 1000 mg daily without any side effects. Previously Amaryl was stopped   previously because of occasional mild hypoglycemia but this was started back in 1/18 because of his A1c going up to 8%,   His A1c again 6.3%, Previous range 6.2-8 percent . Current blood sugar patterns, problems and management:  He was to take only half a tablet of Amaryl 1 mg daily on his last visit but he thinks it started causing his blood sugars to be low again and he stopped this at some point  Currently taking only metformin 1 g daily  He has checked blood sugars only on 4 separate days in the last month with blood sugars mildly increased except around lunchtime last Wednesday  He does not always watch his diet and sometimes will have higher fat foods, cookies or unbalanced meals in the morning  His lab glucose was 186 after breakfast  Blood sugar was high at this morning because of eating cookies last night  He has some fluctuation in his weight ranging from 184-188 this year            Monitors blood glucose: Once a day or less .    Glucometer:   Accu-Chek         Blood Glucose readings from download  : Range 113-164 with highest reading after  lunch Fasting 152 today   Dietitian consultation last done several years ago  Breakfast meals: Sometimes Cheerios cereal, oatmeal, sometimes an egg, otherwise toast and apple butter Cookies sometimes at night  Physical activity: exercise: Recently more yardwork   Wt Readings from Last 3 Encounters:  12/24/16 186 lb 6.4 oz (84.6 kg)  12/14/16 186 lb 3.2 oz (84.5 kg)  12/03/16 184 lb (83.5 kg)   DM LABS:  Lab Results  Component Value Date   HGBA1C 6.3 12/21/2016   HGBA1C 6.3 08/12/2016   HGBA1C 8.0 (H) 03/03/2016   Lab Results  Component Value Date   MICROALBUR 2.1 (H) 03/03/2016   LDLCALC 75 03/03/2016   CREATININE 1.37 12/21/2016   Lab on 12/21/2016  Component Date Value Ref Range Status  . Hgb A1c MFr Bld 12/21/2016 6.3  4.6 - 6.5 % Final   Glycemic Control Guidelines for People with Diabetes:Non Diabetic:  <6%Goal of Therapy: <7%Additional Action Suggested:  >8%   . Sodium 12/21/2016 138  135 - 145 mEq/L Final  . Potassium 12/21/2016 4.0  3.5 - 5.1 mEq/L Final  . Chloride 12/21/2016 101  96 - 112 mEq/L Final  . CO2 12/21/2016 28  19 - 32 mEq/L Final  . Glucose, Bld 12/21/2016 186* 70 - 99 mg/dL Final  . BUN 12/21/2016 23  6 - 23 mg/dL  Final  . Creatinine, Ser 12/21/2016 1.37  0.40 - 1.50 mg/dL Final  . Calcium 12/21/2016 9.9  8.4 - 10.5 mg/dL Final  . GFR 12/21/2016 52.59* >60.00 mL/min Final  . TSH 12/21/2016 1.89  0.35 - 4.50 uIU/mL Final     Allergies as of 12/24/2016      Reactions   Crestor [rosuvastatin]    Nightmares in high dosage   Sertraline Diarrhea   Zocor [simvastatin]    Nightmares in high dosage      Medication List       Accurate as of 12/24/16  1:08 PM. Always use your most recent med list.          aspirin 325 MG tablet Take 325 mg by mouth daily.   b complex vitamins capsule Take 1 capsule by mouth daily.   calcium carbonate 500 MG chewable tablet Commonly known as:  TUMS - dosed in mg elemental calcium Chew 2 tablets by  mouth daily as needed. For indigestion.   Coenzyme Q10-Vitamin E 100-1 MG-UNT/5ML Syrp Take 100 mg by mouth daily. Take 2 tablespoons (100MG ) Daily   fish oil-omega-3 fatty acids 1000 MG capsule Take 1,200 mg by mouth daily. 4 1200 tablets daily   fluticasone 50 MCG/ACT nasal spray Commonly known as:  FLONASE Place 1 spray into both nostrils daily as needed for allergies or rhinitis.   gabapentin 100 MG capsule Commonly known as:  NEURONTIN Take 1 capsule (100 mg total) by mouth at bedtime.   glimepiride 1 MG tablet Commonly known as:  AMARYL Take 1 tablet (1 mg total) by mouth daily with breakfast.   LORazepam 0.5 MG tablet Commonly known as:  ATIVAN TK 1/2 TO 1 T PO QD PRN   losartan-hydrochlorothiazide 100-25 MG tablet Commonly known as:  HYZAAR TAKE 1 TABLET BY MOUTH DAILY   metFORMIN 500 MG 24 hr tablet Commonly known as:  GLUCOPHAGE-XR TAKE 1 TABLET BY MOUTH TWICE DAILY   metoprolol tartrate 25 MG tablet Commonly known as:  LOPRESSOR TAKE 1 TABLET BY MOUTH TWICE DAILY   rosuvastatin 20 MG tablet Commonly known as:  CRESTOR Take 20 mg by mouth every other day.   traMADol 50 MG tablet Commonly known as:  ULTRAM TAKE 1 TABLET BY MOUTH EVERY 6 HOURS AS NEEDED   zolpidem 10 MG tablet Commonly known as:  AMBIEN Take 1 tablet (10 mg total) by mouth once.       Allergies:  Allergies  Allergen Reactions  . Crestor [Rosuvastatin]     Nightmares in high dosage  . Sertraline Diarrhea  . Zocor [Simvastatin]     Nightmares in high dosage    Past Medical History:  Diagnosis Date  . Anxiety   . Bunion 09/29/2011  . Cancer (HCC)    nose/Dr. Albertini;skin  . Chronic constipation   . Chronic kidney disease    stage 111  . Coronary atherosclerosis of native coronary artery    2009 LAD CIRC DES  . Depression    doesn't take any meds for this  . Diabetes mellitus    takes Januvia daily  . Dizziness   . Enlarged prostate    but doesn't require meds at  present  . GERD (gastroesophageal reflux disease)    TUMS prn  . H/O hiatal hernia   . Headache(784.0)   . History of gout    doesn't require meds  . Hx of cardiac cath   . Hyperlipidemia    Crestor 3 x wk and Zetia daily  .  Hypertension    takes Hyzaar daily  . Insomnia    doesn't require meds at present time  . Joint pain   . Onychomycosis 10/05/2012   x 10  . PONV (postoperative nausea and vomiting)    pt states extremely sick  . Shortness of breath    with exertion    Past Surgical History:  Procedure Laterality Date  . bladder cancer    . CARDIAC CATHETERIZATION  2013  . cataracts removed    . CHOLECYSTECTOMY    . COLONOSCOPY    . CORONARY ANGIOPLASTY     2 stents from 2009  . CORONARY ARTERY BYPASS GRAFT  10/29/2011   Procedure: CORONARY ARTERY BYPASS GRAFTING (CABG);  Surgeon: Ivin Poot, MD;  Location: [facility];  Service: Open Heart Surgery;  Laterality: N/A;  . cyst removed from finger     right hand  . EYE SURGERY     bilateral cataracts  . HEMORRHOID SURGERY    . HERNIA REPAIR     x 2;inguinal  . KIDNEY STONE SURGERY    . LEFT HEART CATHETERIZATION WITH CORONARY ANGIOGRAM N/A 10/21/2011   Procedure: LEFT HEART CATHETERIZATION WITH CORONARY ANGIOGRAM;  Surgeon: Candee Furbish, MD;  Location: [facility] CATH LAB;  Service: Cardiovascular;  Laterality: N/A;  . rotator cuff surgery     right   . skin cancer removed      Family History  Problem Relation Age of Onset  . Heart disease Mother   . Stroke Father   . Cancer Brother     Social History:  reports that he quit smoking about 32 years ago. His smoking use included Cigarettes. He has a 45.00 pack-year smoking history. He has never used smokeless tobacco. He reports that he does not drink alcohol or use drugs.  Review of Systems:  HYPERTENSION: fairly well-controlled on losartan HCT   RENAL dysfunction: Minimal Recently  Lab Results  Component Value Date   CREATININE 1.37 12/21/2016   BUN 23 12/21/2016    NA 138 12/21/2016   K 4.0 12/21/2016   CL 101 12/21/2016   CO2 28 12/21/2016     HYPERLIPIDEMIA: The lipid abnormality consists of elevated LDL and low HDL treated with Statin drugs   Currently taking Crestor 20 mg  Also followed by PCP and cardiologist  LDL 77 in 5/18   Lab Results  Component Value Date   CHOL 148 03/03/2016   HDL 35.60 (L) 03/03/2016   LDLCALC 75 03/03/2016   TRIG 190.0 (H) 03/03/2016   CHOLHDL 4 03/03/2016    Thyroid nodule: The nodule Followed up on t ultrasound in December done by his PCP, this was stable He also has a 12 mm probably pseudo-nodule present     Examination:   BP (!) 146/84   Pulse (!) 59   Ht 5\' 6"  (1.676 m)   Wt 186 lb 6.4 oz (84.6 kg)   SpO2 94%   BMI 30.09 kg/m   Body mass index is 30.09 kg/m.  .    ASSESSMENT/ PLAN:   Diabetes type 2 with  Obesity  See history of present illness for detailed discussion of current diabetes management, blood sugar patterns and problems identified  The patient's diabetes control is about the same with A1c 6.3 However not clear when he stopped taking his Amaryl which was reportedly causing hypoglycemia even with 0.5 mg Currently taking 1 g metformin only as his treatment He can do better with his diet and this was discussed especially  with eating balanced meals in the mornings and avoiding high-fat meals and snacks more consistently  Also can do better with checking blood sugars after meals   Although he may be a good candidate for Januvia he probably cannot afford this   Continue metformin Monotherapy for now  HYPERTENSION: Followed by PCP     Patient Instructions  More sugars after meals  Always a protein at Bfst meal     Caniya Tagle 12/24/2016, 1:08 PM

## 2016-12-24 NOTE — Patient Instructions (Signed)
More sugars after meals  Always a protein at Bfst meal

## 2016-12-24 NOTE — Progress Notes (Addendum)
Office Visit Note   Patient: Cody Thomas           Date of Birth: 1930-05-01           MRN: [id number] Visit Date: 12/24/2016              Requested by: Juluis Rainier, MD [address] [place], [place] [address] PCP: Juluis Rainier, MD   Assessment & Plan: Visit Diagnoses:  1. Toe injury, left, initial encounter   2. Spinal stenosis of lumbar region with neurogenic claudication   3. Nondisplaced fracture of middle phalanx of left lesser toe(s), initial encounter for closed fracture   Left sided L5 radiculopathy with diabetes, not diabetic peripheral neuropathy, MRI with bilateral left greater than right L4-5 subarticular stenosis of the lateral recesses with L4-5 grade one spondylolisthesis. His pain pattern is improving and the numbness in the left foot is improving. Options include continued conservative management, ESI or oral steriods. He wishing to give it time to see if it continues to improve.. Continue with tramadol for intermittent pain and gabapentin. The left little toe will be treated with stiff shoe walking flat footed.May buddy tape the left little toe to the 4th toe. Return in 4 weeks to assess toe healing and left leg radiculopathy.   Plan:Avoid bending, stooping and avoid lifting weights greater than 10 lbs. Avoid prolong standing and walking. Avoid frequent bending and stooping  No lifting greater than 10 lbs. May use ice or moist heat for pain. Weight loss is of benefit. Handicap license is approved. Left foot if pain worsens tape the little toe to the 4 toe with a bandaid and place cotton between the toes, use stiff shoe and walk flat footed.  Follow-Up Instructions: Return in about 4 weeks (around 01/21/2017).   Orders:  Orders Placed This Encounter  Procedures  . XR Toe 5th Left   No orders of the defined types were placed in this encounter.     Procedures: No procedures performed   Clinical Data: No additional  findings.   Subjective: Chief Complaint  Patient presents with  . Left Foot - Follow-up    83 year old male with back pain and radiation into the left leg, numbness into the left great toe, underwent EMG/NCV by Dr. Terrace Arabia and found to have peripheral neuropathy and lumbar radiculopathy findings. He notes the pain and numbness into the left foot is better with most of the numbness confined to the left medial great toe and dorsal medial left foot. He had Pain in the left buttock and into the left lateral hip with standing and walking. Previous MRI with multiple level spondylosis and spinal stenosis findings. In the past week injured his left Little toe when he hit it against some furniture. Pain is primarily over the left little toe laterally. No deformity, he did have ecchymosis of the left 2nd through little toes. No bowel or bladder difficulty. Limited standing and walking tolerance.     Review of Systems   Objective: Vital Signs: BP (!) 145/75 (BP Location: Left Arm, Patient Position: Sitting)   Pulse 66   Ht 5\' 5"  (1.651 m)   Wt 186 lb (84.4 kg)   BMI 30.95 kg/m   Physical Exam  Back Exam   Tenderness  The patient is experiencing tenderness in the lumbar.  Range of Motion  Extension: abnormal  Flexion: normal  Lateral Bend Right: abnormal  Lateral Bend Left: abnormal  Rotation Right: abnormal  Rotation Left: abnormal  Muscle Strength  Right Quadriceps:  5/5  Left Quadriceps:  5/5  Right Hamstrings:  5/5  Left Hamstrings:  5/5   Tests  Straight leg raise right: negative Straight leg raise left: negative  Reflexes  Patellar: 0/4 Achilles: 0/4 Babinski's sign: normal   Other  Toe Walk: abnormal Heel Walk: abnormal Sensation: decreased Gait: antalgic  Erythema: no back redness Scars: absent  Comments:  Tender left little toe dorsally and lateral PIP joint. No deformity Weak in the left great toe EHL 3/5.Hip abduction strength is normal.  Decreased  sensation left medial and dorsal great toe to the mid MT level.        Specialty Comments:  No specialty comments available.  Imaging: No results found.   PMFS History: Patient Active Problem List   Diagnosis Date Noted  . OSA (obstructive sleep apnea) 12/14/2016  . Obese 12/14/2016  . Degenerative disc disease, lumbar 02/13/2016  . Chronic left-sided low back pain with left-sided sciatica 02/13/2016  . Left thyroid nodule 01/21/2015  . Pulmonary nodules/lesions, multiple 10/30/2014  . Bladder cancer (HCC) 08/09/2014  . Type II or unspecified type diabetes mellitus without mention of complication, not stated as uncontrolled 10/20/2012  . Onychomycosis 10/05/2012  . Chest pain, unspecified 10/21/2011  . Hypertension   . Hyperlipidemia   . Hx of cardiac cath   . Coronary atherosclerosis of native coronary artery    Past Medical History:  Diagnosis Date  . Anxiety   . Bunion 09/29/2011  . Cancer (HCC)    nose/Dr. Albertini;skin  . Chronic constipation   . Chronic kidney disease    stage 111  . Coronary atherosclerosis of native coronary artery    2009 LAD CIRC DES  . Depression    doesn't take any meds for this  . Diabetes mellitus    takes Januvia daily  . Dizziness   . Enlarged prostate    but doesn't require meds at present  . GERD (gastroesophageal reflux disease)    TUMS prn  . H/O hiatal hernia   . Headache(784.0)   . History of gout    doesn't require meds  . Hx of cardiac cath   . Hyperlipidemia    Crestor 3 x wk and Zetia daily  . Hypertension    takes Hyzaar daily  . Insomnia    doesn't require meds at present time  . Joint pain   . Onychomycosis 10/05/2012   x 10  . PONV (postoperative nausea and vomiting)    pt states extremely sick  . Shortness of breath    with exertion    Family History  Problem Relation Age of Onset  . Heart disease Mother   . Stroke Father   . Cancer Brother     Past Surgical History:  Procedure Laterality  Date  . bladder cancer    . CARDIAC CATHETERIZATION  2013  . cataracts removed    . CHOLECYSTECTOMY    . COLONOSCOPY    . CORONARY ANGIOPLASTY     2 stents from 2009  . CORONARY ARTERY BYPASS GRAFT  10/29/2011   Procedure: CORONARY ARTERY BYPASS GRAFTING (CABG);  Surgeon: Kerin Perna, MD;  Location: [facility] OR;  Service: Open Heart Surgery;  Laterality: N/A;  . cyst removed from finger     right hand  . EYE SURGERY     bilateral cataracts  . HEMORRHOID SURGERY    . HERNIA REPAIR     x 2;inguinal  . KIDNEY STONE SURGERY    .  LEFT HEART CATHETERIZATION WITH CORONARY ANGIOGRAM N/A 10/21/2011   Procedure: LEFT HEART CATHETERIZATION WITH CORONARY ANGIOGRAM;  Surgeon: Donato Schultz, MD;  Location: [facility] CATH LAB;  Service: Cardiovascular;  Laterality: N/A;  . rotator cuff surgery     right   . skin cancer removed     Social History   Occupational History  . Retired    Social History Main Topics  . Smoking status: Former Smoker    Packs/day: 1.50    Years: 30.00    Types: Cigarettes    Quit date: 03/02/1984  . Smokeless tobacco: Never Used  . Alcohol use No  . Drug use: No  . Sexual activity: Not Currently

## 2016-12-24 NOTE — Patient Instructions (Signed)
Avoid bending, stooping and avoid lifting weights greater than 10 lbs. Avoid prolong standing and walking. Avoid frequent bending and stooping  No lifting greater than 10 lbs. May use ice or moist heat for pain. Weight loss is of benefit. Handicap license is approved. Left foot if pain worsens tape the little toe to the 4 toe with a bandaid and place cotton between the toes, use stiff shoe and walk flat footed.

## 2016-12-29 ENCOUNTER — Ambulatory Visit (INDEPENDENT_AMBULATORY_CARE_PROVIDER_SITE_OTHER): Payer: Medicare Other | Admitting: Podiatry

## 2016-12-29 ENCOUNTER — Encounter: Payer: Self-pay | Admitting: Podiatry

## 2016-12-29 DIAGNOSIS — E119 Type 2 diabetes mellitus without complications: Secondary | ICD-10-CM

## 2016-12-29 DIAGNOSIS — M79674 Pain in right toe(s): Secondary | ICD-10-CM

## 2016-12-29 DIAGNOSIS — B351 Tinea unguium: Secondary | ICD-10-CM

## 2016-12-29 DIAGNOSIS — M79675 Pain in left toe(s): Secondary | ICD-10-CM

## 2016-12-29 NOTE — Patient Instructions (Signed)
Removed Band-Aid on third right toe and 1-3 days apply topical antibiotic ointment and Band-Aid daily until a scab forms  Diabetes and Foot Care Diabetes may cause you to have problems because of poor blood supply (circulation) to your feet and legs. This may cause the skin on your feet to become thinner, break easier, and heal more slowly. Your skin may become dry, and the skin may peel and crack. You may also have nerve damage in your legs and feet causing decreased feeling in them. You may not notice minor injuries to your feet that could lead to infections or more serious problems. Taking care of your feet is one of the most important things you can do for yourself. Follow these instructions at home:  Wear shoes at all times, even in the house. Do not go barefoot. Bare feet are easily injured.  Check your feet daily for blisters, cuts, and redness. If you cannot see the bottom of your feet, use a mirror or ask someone for help.  Wash your feet with warm water (do not use hot water) and mild soap. Then pat your feet and the areas between your toes until they are completely dry. Do not soak your feet as this can dry your skin.  Apply a moisturizing lotion or petroleum jelly (that does not contain alcohol and is unscented) to the skin on your feet and to dry, brittle toenails. Do not apply lotion between your toes.  Trim your toenails straight across. Do not dig under them or around the cuticle. File the edges of your nails with an emery board or nail file.  Do not cut corns or calluses or try to remove them with medicine.  Wear clean socks or stockings every day. Make sure they are not too tight. Do not wear knee-high stockings since they may decrease blood flow to your legs.  Wear shoes that fit properly and have enough cushioning. To break in new shoes, wear them for just a few hours a day. This prevents you from injuring your feet. Always look in your shoes before you put them on to be  sure there are no objects inside.  Do not cross your legs. This may decrease the blood flow to your feet.  If you find a minor scrape, cut, or break in the skin on your feet, keep it and the skin around it clean and dry. These areas may be cleansed with mild soap and water. Do not cleanse the area with peroxide, alcohol, or iodine.  When you remove an adhesive bandage, be sure not to damage the skin around it.  If you have a wound, look at it several times a day to make sure it is healing.  Do not use heating pads or hot water bottles. They may burn your skin. If you have lost feeling in your feet or legs, you may not know it is happening until it is too late.  Make sure your health care provider performs a complete foot exam at least annually or more often if you have foot problems. Report any cuts, sores, or bruises to your health care provider immediately. Contact a health care provider if:  You have an injury that is not healing.  You have cuts or breaks in the skin.  You have an ingrown nail.  You notice redness on your legs or feet.  You feel burning or tingling in your legs or feet.  You have pain or cramps in your legs and feet.  Your legs or feet are numb.  Your feet always feel cold. Get help right away if:  There is increasing redness, swelling, or pain in or around a wound.  There is a red line that goes up your leg.  Pus is coming from a wound.  You develop a fever or as directed by your health care provider.  You notice a bad smell coming from an ulcer or wound. This information is not intended to replace advice given to you by your health care provider. Make sure you discuss any questions you have with your health care provider. Document Released: 02/14/2000 Document Revised: 07/25/2015 Document Reviewed: 07/26/2012 Elsevier Interactive Patient Education  2017 [employer].

## 2016-12-29 NOTE — Progress Notes (Signed)
Patient ID: Cody Thomas, male   DOB: Nov 08, 1930, 83 y.o.   MRN: [id number]   Subjective: This patient presents again for schedule visit complaining of painful toenails walking wearing shoes and request toenail debridement To history of minor stubbing fifth left toe  Objective: Orientated 3 DP and PT pulses 2/4 bilaterally Capillary reflex immediate bilaterally Sensation to 10 g monofilament wire intact 5/5 bilaterally Vibratory sensation reactive bilaterally Ankle reflexes equal and reactive bilaterally No skin lesions bilaterally Atrophic skin bilaterally Absent hair growth bilaterally HAV bilaterally Manual motor testing dorsi flexion, plantar flexion 5/5 bilaterally The toenails are elongated, brittle, hypertrophic, incurvated, discolored and tender to direct palpation 6-10 Mild edema fifth left toe without any open lesions. The toe is rectus not tender direct palpation  Assessment: Symptomatic onychomycoses 6-10 Type II diabetic without complications  Plan: Debrided toenails 10 mechanically and electrically with slight bleeding distal third right toe treated with topical antibiotic ointment and Band-Aid. Patient instructed removed Band-Aid 1-3 days apply topical antibiotic ointment and Band-Aid daily until a scab forms  Reappoint 3 months

## 2017-01-18 DIAGNOSIS — G4733 Obstructive sleep apnea (adult) (pediatric): Secondary | ICD-10-CM | POA: Diagnosis not present

## 2017-01-25 DIAGNOSIS — E119 Type 2 diabetes mellitus without complications: Secondary | ICD-10-CM | POA: Diagnosis not present

## 2017-01-25 DIAGNOSIS — N183 Chronic kidney disease, stage 3 (moderate): Secondary | ICD-10-CM | POA: Diagnosis not present

## 2017-01-25 DIAGNOSIS — I10 Essential (primary) hypertension: Secondary | ICD-10-CM | POA: Diagnosis not present

## 2017-01-25 DIAGNOSIS — J449 Chronic obstructive pulmonary disease, unspecified: Secondary | ICD-10-CM | POA: Diagnosis not present

## 2017-02-04 ENCOUNTER — Ambulatory Visit (INDEPENDENT_AMBULATORY_CARE_PROVIDER_SITE_OTHER): Payer: Medicare Other | Admitting: Specialist

## 2017-02-04 ENCOUNTER — Encounter (INDEPENDENT_AMBULATORY_CARE_PROVIDER_SITE_OTHER): Payer: Self-pay | Admitting: Specialist

## 2017-02-04 VITALS — BP 165/81 | HR 56 | Ht 65.0 in | Wt 186.0 lb

## 2017-02-04 DIAGNOSIS — M79672 Pain in left foot: Secondary | ICD-10-CM

## 2017-02-04 DIAGNOSIS — M4316 Spondylolisthesis, lumbar region: Secondary | ICD-10-CM

## 2017-02-04 DIAGNOSIS — M25572 Pain in left ankle and joints of left foot: Secondary | ICD-10-CM

## 2017-02-04 DIAGNOSIS — M48062 Spinal stenosis, lumbar region with neurogenic claudication: Secondary | ICD-10-CM

## 2017-02-04 MED ORDER — GABAPENTIN 100 MG PO CAPS: 200.0000 mg | ORAL_CAPSULE | Freq: Every day | ORAL | 6 refills | Status: DC

## 2017-02-04 NOTE — Patient Instructions (Signed)
Plan: Avoid bending, stooping and avoid lifting weights greater than 10 lbs. Avoid prolong standing and walking. Avoid frequent bending and stooping  No lifting greater than 10 lbs. May use ice or moist heat for pain. Weight loss is of benefit. Handicap license is approved. 

## 2017-02-04 NOTE — Progress Notes (Signed)
Office Visit Note   Patient: Cody Thomas           Date of Birth: [birth year]           MRN: [id number] Visit Date: 02/04/2017              Requested by: Leighton Ruff, MD [place], [place] [address] PCP: Leighton Ruff, MD   Assessment & Plan: Visit Diagnoses:  1. Spinal stenosis of lumbar region with neurogenic claudication     PlanAvoid bending, stooping and avoid lifting weights greater than 10 lbs. Avoid prolong standing and walking. Avoid frequent bending and stooping  No lifting greater than 10 lbs. May use ice or moist heat for pain. Weight loss is of benefit. Handicap license is approved.    Follow-Up Instructions: Return if symptoms worsen or fail to improve.   Orders:  No orders of the defined types were placed in this encounter.  No orders of the defined types were placed in this encounter.     Procedures: No procedures performed   Clinical Data: No additional findings.   Subjective: Chief Complaint  Patient presents with  . Lower Back - Follow-up  . Left Foot - Follow-up    83 year old male with left leg pain and numbness with standing and walking. He is not taking any pain meds and worse pain is in the morning when he first Tries to straighten his back then it takes about 2 hours.    Review of Systems  Constitutional: Negative.   HENT: Negative.   Eyes: Negative.   Respiratory: Negative.   Cardiovascular: Negative.   Gastrointestinal: Negative.   Endocrine: Negative.   Genitourinary: Negative.   Musculoskeletal: Negative.   Skin: Negative.   Allergic/Immunologic: Negative.   Neurological: Negative.   Hematological: Negative.   Psychiatric/Behavioral: Negative.      Objective: Vital Signs: BP (!) 165/81 (BP Location: Left Arm, Patient Position: Sitting)   Pulse (!) 56   Ht 5\' 5"  (1.651 m)   Wt 186 lb (84.4 kg)   BMI 30.95 kg/m   Physical Exam  Constitutional: He is oriented to person, place,  and time. He appears well-developed and well-nourished.  Eyes: Right eye exhibits no discharge. Left eye exhibits no discharge.  Neck: Normal range of motion. Neck supple.  Pulmonary/Chest: Effort normal and breath sounds normal. No respiratory distress.  Musculoskeletal: He exhibits no edema, tenderness or deformity.  Neurological: He is alert and oriented to person, place, and time.  Skin: Skin is warm.  Psychiatric: He has a normal mood and affect. His behavior is normal. Judgment and thought content normal.    Back Exam   Tenderness  The patient is experiencing tenderness in the lumbar.  Range of Motion  Extension: abnormal  Flexion: normal  Lateral bend right: abnormal  Lateral bend left: abnormal  Rotation right: abnormal  Rotation left: abnormal   Muscle Strength  Right Quadriceps:  5/5  Left Quadriceps:  5/5  Right Hamstrings:  5/5  Left Hamstrings:  5/5   Tests  Straight leg raise right: negative Straight leg raise left: negative  Reflexes  Babinski's sign: normal   Other  Toe walk: normal Heel walk: normal Sensation: normal Erythema: no back redness Scars: absent      Specialty Comments:  No specialty comments available.  Imaging: No results found.   PMFS History: Patient Active Problem List   Diagnosis Date Noted  . OSA (obstructive sleep apnea) 12/14/2016  . Obese  12/14/2016  . Degenerative disc disease, lumbar 02/13/2016  . Chronic left-sided low back pain with left-sided sciatica 02/13/2016  . Left thyroid nodule 01/21/2015  . Pulmonary nodules/lesions, multiple 10/30/2014  . Bladder cancer ([facility]) 08/09/2014  . Type II or unspecified type diabetes mellitus without mention of complication, not stated as uncontrolled 10/20/2012  . Onychomycosis 10/05/2012  . Chest pain, unspecified 10/21/2011  . Hypertension   . Hyperlipidemia   . Hx of cardiac cath   . Coronary atherosclerosis of native coronary artery    Past Medical History:    Diagnosis Date  . Anxiety   . Bunion 09/29/2011  . Cancer (HCC)    nose/Dr. Albertini;skin  . Chronic constipation   . Chronic kidney disease    stage 111  . Coronary atherosclerosis of native coronary artery    2009 LAD CIRC DES  . Depression    doesn't take any meds for this  . Diabetes mellitus    takes Januvia daily  . Dizziness   . Enlarged prostate    but doesn't require meds at present  . GERD (gastroesophageal reflux disease)    TUMS prn  . H/O hiatal hernia   . Headache(784.0)   . History of gout    doesn't require meds  . Hx of cardiac cath   . Hyperlipidemia    Crestor 3 x wk and Zetia daily  . Hypertension    takes Hyzaar daily  . Insomnia    doesn't require meds at present time  . Joint pain   . Onychomycosis 10/05/2012   x 10  . PONV (postoperative nausea and vomiting)    pt states extremely sick  . Shortness of breath    with exertion    Family History  Problem Relation Age of Onset  . Heart disease Mother   . Stroke Father   . Cancer Brother     Past Surgical History:  Procedure Laterality Date  . bladder cancer    . CARDIAC CATHETERIZATION  2013  . cataracts removed    . CHOLECYSTECTOMY    . COLONOSCOPY    . CORONARY ANGIOPLASTY     2 stents from 2009  . CORONARY ARTERY BYPASS GRAFT  10/29/2011   Procedure: CORONARY ARTERY BYPASS GRAFTING (CABG);  Surgeon: Ivin Poot, MD;  Location: [facility];  Service: Open Heart Surgery;  Laterality: N/A;  . cyst removed from finger     right hand  . EYE SURGERY     bilateral cataracts  . HEMORRHOID SURGERY    . HERNIA REPAIR     x 2;inguinal  . KIDNEY STONE SURGERY    . LEFT HEART CATHETERIZATION WITH CORONARY ANGIOGRAM N/A 10/21/2011   Procedure: LEFT HEART CATHETERIZATION WITH CORONARY ANGIOGRAM;  Surgeon: Candee Furbish, MD;  Location: [facility] CATH LAB;  Service: Cardiovascular;  Laterality: N/A;  . rotator cuff surgery     right   . skin cancer removed     Social History   Occupational History   . Occupation: Retired  Tobacco Use  . Smoking status: Former Smoker    Packs/day: 1.50    Years: 30.00    Pack years: 45.00    Types: Cigarettes    Last attempt to quit: 03/02/1984    Years since quitting: 32.9  . Smokeless tobacco: Never Used  Substance and Sexual Activity  . Alcohol use: No    Alcohol/week: 0.0 oz  . Drug use: No  . Sexual activity: Not Currently

## 2017-02-17 DIAGNOSIS — G4733 Obstructive sleep apnea (adult) (pediatric): Secondary | ICD-10-CM | POA: Diagnosis not present

## 2017-03-03 ENCOUNTER — Encounter: Payer: Self-pay | Admitting: Adult Health

## 2017-03-03 ENCOUNTER — Ambulatory Visit: Payer: Medicare Other | Admitting: Adult Health

## 2017-03-03 DIAGNOSIS — G4733 Obstructive sleep apnea (adult) (pediatric): Secondary | ICD-10-CM | POA: Diagnosis not present

## 2017-03-03 NOTE — Progress Notes (Signed)
@Patient  ID: Cody Thomas, male    DOB: 07/02/1930, 84 y.o.   MRN: [id number]  Chief Complaint  Patient presents with  . Follow-up    OSA     Referring provider: Leighton Ruff, MD  HPI: 84 year old male former smoker followed for pulmonary nodules and sleep apnea (dx 11/2016)  Hx of Bladder cancer , CAD s/p CABG   TEST  Chest imaging: CT chest images from last week were personally reviewed showing very small pulmonary nodules, the largest of which is 5 mm in the lingula no change compared to prior. CT chest 09/2016 : Stable nodules compared to the last image in 2017, Images independently reviewed, centrilobular emphysema seen..no further  Testing needed.  Sleep study October 2018 mild sleep apnea with AHI 7.3  03/03/2017 Follow up : OSA  Patient returns for a 82-month follow-up.  Patient has known mild sleep apnea.  He has a sleep study October 2018 that showed mild sleep apnea.  He is recently started on CPAP.  Patient says it helps his breathing . Feels his mask is irriatating at times. Snoring has resolved.   He uses his CPAP each night.  For about 5 hours.  Download shows excellent compliance with average usage at 5.5 hours.  Patient is on CPAP AutoSet 5-15 cm H2O.  AHI 3.9.  Minimum leaks.   Allergies  Allergen Reactions  . Crestor [Rosuvastatin]     Nightmares in high dosage  . Sertraline Diarrhea  . Zocor [Simvastatin]     Nightmares in high dosage    Immunization History  Administered Date(s) Administered  . Influenza, High Dose Seasonal PF 11/09/2016  . Influenza,inj,Quad PF,6+ Mos 01/19/2013  . Influenza-Unspecified 12/19/2013, 12/21/2014  . Pneumococcal Conjugate-13 10/29/2012  . Pneumococcal Polysaccharide-23 10/29/2009    Past Medical History:  Diagnosis Date  . Anxiety   . Bunion 09/29/2011  . Cancer (HCC)    nose/Dr. Albertini;skin  . Chronic constipation   . Chronic kidney disease    stage 111  . Coronary atherosclerosis of native coronary  artery    2009 LAD CIRC DES  . Depression    doesn't take any meds for this  . Diabetes mellitus    takes Januvia daily  . Dizziness   . Enlarged prostate    but doesn't require meds at present  . GERD (gastroesophageal reflux disease)    TUMS prn  . H/O hiatal hernia   . Headache(784.0)   . History of gout    doesn't require meds  . Hx of cardiac cath   . Hyperlipidemia    Crestor 3 x wk and Zetia daily  . Hypertension    takes Hyzaar daily  . Insomnia    doesn't require meds at present time  . Joint pain   . Onychomycosis 10/05/2012   x 10  . PONV (postoperative nausea and vomiting)    pt states extremely sick  . Shortness of breath    with exertion    Tobacco History: Social History   Tobacco Use  Smoking Status Former Smoker  . Packs/day: 1.50  . Years: 30.00  . Pack years: 45.00  . Types: Cigarettes  . Last attempt to quit: 03/02/1984  . Years since quitting: 33.0  Smokeless Tobacco Never Used   Counseling given: Not Answered   Outpatient Encounter Medications as of 03/03/2017  Medication Sig  . aspirin 325 MG tablet Take 325 mg by mouth daily.  Marland Kitchen b complex vitamins capsule Take 1 capsule by mouth  daily.  . calcium carbonate (TUMS - DOSED IN MG ELEMENTAL CALCIUM) 500 MG chewable tablet Chew 2 tablets by mouth daily as needed. For indigestion.   . Coenzyme Q10-Vitamin E 100-1 MG-UNT/5ML SYRP Take 100 mg by mouth daily. Take 2 tablespoons (100MG ) Daily  . fish oil-omega-3 fatty acids 1000 MG capsule Take 1,200 mg by mouth daily. 4 1200 tablets daily  . fluticasone (FLONASE) 50 MCG/ACT nasal spray Place 1 spray into both nostrils daily as needed for allergies or rhinitis.  Marland Kitchen gabapentin (NEURONTIN) 100 MG capsule Take 2 capsules (200 mg total) by mouth at bedtime.  Marland Kitchen LORazepam (ATIVAN) 0.5 MG tablet TK 1/2 TO 1 T PO QD PRN  . losartan-hydrochlorothiazide (HYZAAR) 100-25 MG tablet TAKE 1 TABLET BY MOUTH DAILY  . metFORMIN (GLUCOPHAGE-XR) 500 MG 24 hr tablet TAKE  1 TABLET BY MOUTH TWICE DAILY  . metoprolol tartrate (LOPRESSOR) 25 MG tablet TAKE 1 TABLET BY MOUTH TWICE DAILY  . rosuvastatin (CRESTOR) 20 MG tablet Take 20 mg by mouth every other day.  . traMADol (ULTRAM) 50 MG tablet Take 1 tablet (50 mg total) by mouth every 6 (six) hours as needed.  . [DISCONTINUED] glimepiride (AMARYL) 1 MG tablet Take 1 tablet (1 mg total) by mouth daily with breakfast.  . [DISCONTINUED] zolpidem (AMBIEN) 10 MG tablet Take 1 tablet (10 mg total) by mouth once.   No facility-administered encounter medications on file as of 03/03/2017.      Review of Systems  Constitutional:   No  weight loss, night sweats,  Fevers, chills, + fatigue, or  lassitude.  HEENT:   No headaches,  Difficulty swallowing,  Tooth/dental problems, or  Sore throat,                No sneezing, itching, ear ache, nasal congestion, post nasal drip,   CV:  No chest pain,  Orthopnea, PND, swelling in lower extremities, anasarca, dizziness, palpitations, syncope.   GI  No heartburn, indigestion, abdominal pain, nausea, vomiting, diarrhea, change in bowel habits, loss of appetite, bloody stools.   Resp: No shortness of breath with exertion or at rest.  No excess mucus, no productive cough,  No non-productive cough,  No coughing up of blood.  No change in color of mucus.  No wheezing.  No chest wall deformity  Skin: no rash or lesions.  GU: no dysuria, change in color of urine, no urgency or frequency.  No flank pain, no hematuria   MS:  No joint pain or swelling.  No decreased range of motion.  No back pain.    Physical Exam  BP 128/70   Pulse 60   Ht 5\' 4"  (1.626 m)   Wt 183 lb 8 oz (83.2 kg)   SpO2 92%   BMI 31.50 kg/m   GEN: A/Ox3; pleasant , NAD, elderly , obese    HEENT:  [place]/AT,  EACs-clear, TMs-wnl, NOSE-clear, THROAT-clear, no lesions, no postnasal drip or exudate noted. Class 2-3 MP airway .   NECK:  Supple w/ fair ROM; no JVD; normal carotid impulses w/o bruits; no  thyromegaly or nodules palpated; no lymphadenopathy.    RESP  Clear  P & A; w/o, wheezes/ rales/ or rhonchi. no accessory muscle use, no dullness to percussion  CARD:  RRR, no m/r/g, tr peripheral edema, pulses intact, no cyanosis or clubbing.  GI:   Soft & nt; nml bowel sounds; no organomegaly or masses detected.   Musco: Warm bil, no deformities or joint swelling noted.  Neuro: alert, no focal deficits noted.    Skin: Warm, no lesions or rashes    Lab Results:  CBC  No results found for: BNP  ProBNP No results found for: PROBNP  Imaging: No results found.   Assessment & Plan:   OSA (obstructive sleep apnea) Doing well on CPAP  Change to full face dream wear mask   Plan  Patient Instructions  Continue on CPAP At bedtime  .  Call if you like the full face mask- we can order for you. (Dream Wear full face mask) .  Wear for at least 4-6hr each night.  Work on healthy weight .  Do not drive if sleepy .  Follow up with Dr. Lake Bells in 4 months and As needed         Obese Wt loss      Rexene Edison, NP 03/03/2017

## 2017-03-03 NOTE — Assessment & Plan Note (Signed)
Wt loss  

## 2017-03-03 NOTE — Progress Notes (Signed)
Reviewed, agree 

## 2017-03-03 NOTE — Assessment & Plan Note (Signed)
Doing well on CPAP  Change to full face dream wear mask   Plan  Patient Instructions  Continue on CPAP At bedtime  .  Call if you like the full face mask- we can order for you. (Dream Wear full face mask) .  Wear for at least 4-6hr each night.  Work on healthy weight .  Do not drive if sleepy .  Follow up with Dr. Lake Bells in 4 months and As needed

## 2017-03-03 NOTE — Patient Instructions (Signed)
Continue on CPAP At bedtime  .  Call if you like the full face mask- we can order for you. (Dream Wear full face mask) .  Wear for at least 4-6hr each night.  Work on healthy weight .  Do not drive if sleepy .  Follow up with Dr. Lake Bells in 4 months and As needed

## 2017-03-20 DIAGNOSIS — G4733 Obstructive sleep apnea (adult) (pediatric): Secondary | ICD-10-CM | POA: Diagnosis not present

## 2017-03-23 ENCOUNTER — Other Ambulatory Visit: Payer: Self-pay | Admitting: Endocrinology

## 2017-03-23 ENCOUNTER — Other Ambulatory Visit: Payer: Self-pay | Admitting: Cardiology

## 2017-03-30 ENCOUNTER — Ambulatory Visit: Payer: Medicare Other | Admitting: Podiatry

## 2017-03-31 ENCOUNTER — Ambulatory Visit (INDEPENDENT_AMBULATORY_CARE_PROVIDER_SITE_OTHER): Payer: Medicare Other | Admitting: Podiatry

## 2017-03-31 DIAGNOSIS — M79675 Pain in left toe(s): Secondary | ICD-10-CM

## 2017-03-31 DIAGNOSIS — B351 Tinea unguium: Secondary | ICD-10-CM

## 2017-03-31 DIAGNOSIS — M79674 Pain in right toe(s): Secondary | ICD-10-CM | POA: Diagnosis not present

## 2017-03-31 NOTE — Progress Notes (Signed)
Subjective:   Patient ID: Cody Thomas, male   DOB: 84 y.o.   MRN: [id number]   HPI Patient presents with elongated nailbeds 1-5 both feet that are painful and make shoe gear difficult.  Patient does try to trim them himself   ROS      Objective:  Physical Exam  Neurovascular status intact with thick yellow brittle nailbeds 1-5 both feet that are painful from a dorsal direction make shoe gear difficult     Assessment:  Mycotic nail infection 1-5 both feet     Plan:  Debride painful nailbeds 1-5 both feet with no iatrogenic bleeding noted

## 2017-04-05 ENCOUNTER — Other Ambulatory Visit (INDEPENDENT_AMBULATORY_CARE_PROVIDER_SITE_OTHER): Payer: Self-pay | Admitting: Specialist

## 2017-04-05 DIAGNOSIS — S99922A Unspecified injury of left foot, initial encounter: Secondary | ICD-10-CM

## 2017-04-05 DIAGNOSIS — S92525A Nondisplaced fracture of medial phalanx of left lesser toe(s), initial encounter for closed fracture: Secondary | ICD-10-CM

## 2017-04-05 DIAGNOSIS — M48062 Spinal stenosis, lumbar region with neurogenic claudication: Secondary | ICD-10-CM

## 2017-04-05 NOTE — Telephone Encounter (Signed)
Tramadol refill request 

## 2017-04-05 NOTE — Telephone Encounter (Signed)
Called Rx to [facility]

## 2017-04-08 DIAGNOSIS — E119 Type 2 diabetes mellitus without complications: Secondary | ICD-10-CM | POA: Diagnosis not present

## 2017-04-08 DIAGNOSIS — I10 Essential (primary) hypertension: Secondary | ICD-10-CM | POA: Diagnosis not present

## 2017-04-08 DIAGNOSIS — Z0000 Encounter for general adult medical examination without abnormal findings: Secondary | ICD-10-CM | POA: Diagnosis not present

## 2017-04-08 DIAGNOSIS — N183 Chronic kidney disease, stage 3 (moderate): Secondary | ICD-10-CM | POA: Diagnosis not present

## 2017-04-14 DIAGNOSIS — E119 Type 2 diabetes mellitus without complications: Secondary | ICD-10-CM | POA: Diagnosis not present

## 2017-04-22 ENCOUNTER — Ambulatory Visit: Payer: Medicare Other | Admitting: Cardiology

## 2017-04-22 ENCOUNTER — Encounter: Payer: Self-pay | Admitting: Cardiology

## 2017-04-22 VITALS — BP 142/78 | HR 66 | Ht 63.0 in | Wt 187.8 lb

## 2017-04-22 DIAGNOSIS — C679 Malignant neoplasm of bladder, unspecified: Secondary | ICD-10-CM

## 2017-04-22 DIAGNOSIS — I10 Essential (primary) hypertension: Secondary | ICD-10-CM

## 2017-04-22 DIAGNOSIS — I2510 Atherosclerotic heart disease of native coronary artery without angina pectoris: Secondary | ICD-10-CM | POA: Diagnosis not present

## 2017-04-22 DIAGNOSIS — E7800 Pure hypercholesterolemia, unspecified: Secondary | ICD-10-CM

## 2017-04-22 MED ORDER — ASPIRIN EC 81 MG PO TBEC: 81.0000 mg | DELAYED_RELEASE_TABLET | Freq: Every day | ORAL | Status: DC

## 2017-04-22 NOTE — Progress Notes (Signed)
.     [address], [address] [place], [place]  [address] Phone: [phone] Fax:  [phone]  Date:  04/22/2017   ID:  Cody Thomas, DOB 03/19/1930, MRN [id number]  PCP:  Juluis Rainier, MD   History of Present Illness: Cody Thomas is a 84 y.o. male with coronary disease status post 4 vessel bypass surgery 10/29/11 by Dr. Maren Beach here for followup. He a brief postoperative atrial fibrillation. He has had some irritation/itching in his chest wall post bypass.   Overall no anginal like symptoms. Doing well. Main complaint is left leg sciatica. He has been seeing orthopedics for this.  Unfortunately found bladder cancer after bout of kidney stone. He is now in remission and is being watched by Dr. Earlene Plater.  No significant shortness of breath, no syncope, no faint-like spells, no bleeding  He decided that he did not like the way atorvastatin and feel, he resumed Crestor 3 times a week. Has nightmare if higher dose.   OSA - 7/hr. CPAP machine. Fight it too. Leaks. Denies CP, SOB. No bleeding. Still with back pain.   Wt Readings from Last 3 Encounters:  04/22/17 187 lb 12.8 oz (85.2 kg)  03/03/17 183 lb 8 oz (83.2 kg)  02/04/17 186 lb (84.4 kg)     Past Medical History:  Diagnosis Date  . Anxiety   . Bunion 09/29/2011  . Cancer (HCC)    nose/Dr. Albertini;skin  . Chronic constipation   . Chronic kidney disease    stage 111  . Coronary atherosclerosis of native coronary artery    2009 LAD CIRC DES  . Depression    doesn't take any meds for this  . Diabetes mellitus    takes Januvia daily  . Dizziness   . Enlarged prostate    but doesn't require meds at present  . GERD (gastroesophageal reflux disease)    TUMS prn  . H/O hiatal hernia   . Headache(784.0)   . History of gout    doesn't require meds  . Hx of cardiac cath   . Hyperlipidemia    Crestor 3 x wk and Zetia daily  . Hypertension    takes Hyzaar daily  . Insomnia    doesn't require  meds at present time  . Joint pain   . Onychomycosis 10/05/2012   x 10  . PONV (postoperative nausea and vomiting)    pt states extremely sick  . Shortness of breath    with exertion    Past Surgical History:  Procedure Laterality Date  . bladder cancer    . CARDIAC CATHETERIZATION  2013  . cataracts removed    . CHOLECYSTECTOMY    . COLONOSCOPY    . CORONARY ANGIOPLASTY     2 stents from 2009  . CORONARY ARTERY BYPASS GRAFT  10/29/2011   Procedure: CORONARY ARTERY BYPASS GRAFTING (CABG);  Surgeon: Kerin Perna, MD;  Location: [facility] OR;  Service: Open Heart Surgery;  Laterality: N/A;  . cyst removed from finger     right hand  . EYE SURGERY     bilateral cataracts  . HEMORRHOID SURGERY    . HERNIA REPAIR     x 2;inguinal  . KIDNEY STONE SURGERY    . LEFT HEART CATHETERIZATION WITH CORONARY ANGIOGRAM N/A 10/21/2011   Procedure: LEFT HEART CATHETERIZATION WITH CORONARY ANGIOGRAM;  Surgeon: Donato Schultz, MD;  Location: [facility] CATH LAB;  Service: Cardiovascular;  Laterality: N/A;  . rotator cuff surgery  right   . skin cancer removed      Current Outpatient Medications  Medication Sig Dispense Refill  . b complex vitamins capsule Take 1 capsule by mouth daily.    . calcium carbonate (TUMS - DOSED IN MG ELEMENTAL CALCIUM) 500 MG chewable tablet Chew 2 tablets by mouth daily as needed. For indigestion.     . Coenzyme Q10-Vitamin E 100-1 MG-UNT/5ML SYRP Take 100 mg by mouth daily. Take 2 tablespoons (100MG ) Daily    . fish oil-omega-3 fatty acids 1000 MG capsule Take 1,200 mg by mouth daily. 4 1200 tablets daily    . fluticasone (FLONASE) 50 MCG/ACT nasal spray Place 1 spray into both nostrils daily as needed for allergies or rhinitis.    Marland Kitchen gabapentin (NEURONTIN) 100 MG capsule Take 2 capsules (200 mg total) by mouth at bedtime. 60 capsule 6  . LORazepam (ATIVAN) 0.5 MG tablet TK 1/2 TO 1 T PO QD PRN  0  . losartan-hydrochlorothiazide (HYZAAR) 100-25 MG tablet TAKE 1 TABLET BY MOUTH  DAILY 90 tablet 3  . metFORMIN (GLUCOPHAGE-XR) 500 MG 24 hr tablet TAKE 1 TABLET BY MOUTH TWICE DAILY 180 tablet 0  . metoprolol tartrate (LOPRESSOR) 25 MG tablet TAKE 1 TABLET BY MOUTH TWICE DAILY 180 tablet 0  . rosuvastatin (CRESTOR) 20 MG tablet Take 20 mg by mouth every other day.    . traMADol (ULTRAM) 50 MG tablet TAKE 1 TABLET BY MOUTH EVERY 6 HOURS AS NEEDED 30 tablet 0  . aspirin EC 81 MG tablet Take 1 tablet (81 mg total) by mouth daily.     No current facility-administered medications for this visit.     Allergies:    Allergies  Allergen Reactions  . Crestor [Rosuvastatin]     Nightmares in high dosage  . Sertraline Diarrhea  . Zocor [Simvastatin]     Nightmares in high dosage    Social History:  The patient  reports that he quit smoking about 33 years ago. His smoking use included cigarettes. He has a 45.00 pack-year smoking history. he has never used smokeless tobacco. He reports that he does not drink alcohol or use drugs.   ROS:  Please see the history of present illness.   All other ROS neg.  PHYSICAL EXAM: VS:  BP (!) 142/78   Pulse 66   Ht 5\' 3"  (1.6 m)   Wt 187 lb 12.8 oz (85.2 kg)   BMI 33.27 kg/m  GEN: Well nourished, well developed, in no acute distress  HEENT: normal  Neck: no JVD, carotid bruits, or masses Cardiac: RRR; no murmurs, rubs, or gallops,no edema  Respiratory:  clear to auscultation bilaterally, normal work of breathing GI: soft, nontender, nondistended, + BS MS: no deformity or atrophy  Skin: warm and dry, no rash Neuro:  Alert and Oriented x 3, Strength and sensation are intact Psych: euthymic mood, full affect  EKG: EKG today 04/22/17 - NSR with 1st degree AVB/ 04/13/16-sinus rhythm first-degree AV block heart rate 70 PR interval 264 ms. Personally viewed-prior 08/21/13 - Sinus bradycardia, first degree AV block, PR interval 298 ms otherwise unremarkable.  ASSESSMENT AND PLAN:  1. Hyperlipidemia- Crestor 20 mg 3 times a week.  Dr.  Zachery Dauer has been following blood work. Last LDL 119 on 12/2016 - he would like to continue with current dose because higher dose causes nightmares.  2. First degree AVB - previously, 304 ms today. No need for pacemaker right now. Stable 3. Occasional palpitations-continuing with low-dose metoprolol. I  am aware of his first-degree AV block. If this worsens, we will need to stop this medication. We discussed the possibility of pacemaker in the future. For now continue current plan.  4. Hypertension-good overall control. Stable.  5. CAD-stable, post bypass. No current typical anginal symptoms. Good.  6. Obesity-continue to encourage weight loss. No change 7. Bladder cancer-Dr. Earlene Plater. Post chemotherapy. Found after kidney stone. He is monitoring this every 3 months. He does not like the scope.  8. 67-month follow-up  Signed, Donato Schultz, MD [facility]  04/22/2017 4:28 PM

## 2017-04-22 NOTE — Patient Instructions (Signed)
Medication Instructions:  Please decrease your ASA to 81 mg a day. Continue all other medications as listed.  Follow-Up: Follow up in 1 year with Dr. Skains.  You will receive a letter in the mail 2 months before you are due.  Please call us when you receive this letter to schedule your follow up appointment.  If you need a refill on your cardiac medications before your next appointment, please call your pharmacy.  Thank you for choosing [facility]!!      

## 2017-04-26 ENCOUNTER — Other Ambulatory Visit (INDEPENDENT_AMBULATORY_CARE_PROVIDER_SITE_OTHER): Payer: Medicare Other

## 2017-04-26 DIAGNOSIS — E1165 Type 2 diabetes mellitus with hyperglycemia: Secondary | ICD-10-CM

## 2017-04-26 DIAGNOSIS — E041 Nontoxic single thyroid nodule: Secondary | ICD-10-CM

## 2017-04-26 LAB — TSH: TSH: 1.93 u[IU]/mL (ref 0.35–4.50)

## 2017-04-26 LAB — MICROALBUMIN / CREATININE URINE RATIO
Creatinine,U: 125.9 mg/dL
Microalb Creat Ratio: 0.6 mg/g (ref 0.0–30.0)
Microalb, Ur: 0.7 mg/dL (ref 0.0–1.9)

## 2017-04-26 LAB — COMPREHENSIVE METABOLIC PANEL
ALT: 18 U/L (ref 0–53)
AST: 20 U/L (ref 0–37)
Albumin: 4.1 g/dL (ref 3.5–5.2)
Alkaline Phosphatase: 45 U/L (ref 39–117)
BUN: 19 mg/dL (ref 6–23)
CO2: 28 mEq/L (ref 19–32)
Calcium: 9.9 mg/dL (ref 8.4–10.5)
Chloride: 103 meq/L (ref 96–112)
Creatinine, Ser: 1.42 mg/dL (ref 0.40–1.50)
GFR: 50.41 mL/min — ABNORMAL LOW (ref >60.00)
Glucose, Bld: 129 mg/dL — ABNORMAL HIGH (ref 70–99)
Potassium: 4 mEq/L (ref 3.5–5.1)
Sodium: 142 meq/L (ref 135–145)
Total Bilirubin: 0.8 mg/dL (ref 0.2–1.2)
Total Protein: 7 g/dL (ref 6.0–8.3)

## 2017-04-26 LAB — HEMOGLOBIN A1C: Hgb A1c MFr Bld: 6.6 % — ABNORMAL HIGH (ref 4.6–6.5)

## 2017-04-27 NOTE — Progress Notes (Signed)
Patient ID: Cody Thomas, male   DOB: 04-19-1930, 84 y.o.   MRN: [id number]   Reason for Appointment: Diabetes follow-up   History of Present Illness   Diagnosis: Type 2 DIABETES MELITUS  He has had mild diabetes which has been previously erratic partly because of dietary inconsistency and difficulty with weight loss He has not been continued on metformin because of renal dysfunction Was given low-dose Januvia instead but this was too expensive for him even though it was helping his control In 2014 he was given low dose Amaryl instead which appears to be keeping his blood sugars controlled  His blood sugars were not well controlled when he was seen in 1/16 with A1c 7.8  Recent history:  Since 1/16 he has been on metformin and he is  taking 1000 mg daily without any side effects. Previously Amaryl was stopped   His A1c is slightly higher at 6.6 , Previous range 6.2-8 percent . Current blood sugar patterns, problems and management:  He may have had a low A1c on the previous visit since he had just stopped taking a small dose of Amaryl  His blood sugars are at home very well controlled with 1000 mg of metformin only  Fasting readings are mildly increased with the range of 102 up to 146  He has only a couple of readings nonfasting, only one relatively high reading of 153 at bedtime  Still not able to lose weight and has gained some weight over the last month or so  He says he does not always cut back on sweets  Only in the last week he has started going to an exercise facility             Monitors blood glucose: Once a day or less .    Glucometer:   Accu-Chek         Blood Glucose readings from download as above  Overall average 129  Dietitian consultation last done several years ago  Breakfast meals: Sometimes Cheerios cereal, oatmeal, sometimes an egg, otherwise toast and apple butter Cookies sometimes at night  Physical activity: exercise: Recently at  [employer], outgoing mostly weights   Wt Readings from Last 3 Encounters:  04/28/17 187 lb 6.4 oz (85 kg)  04/22/17 187 lb 12.8 oz (85.2 kg)  03/03/17 183 lb 8 oz (83.2 kg)   DM LABS:  Lab Results  Component Value Date   HGBA1C 6.6 (H) 04/26/2017   HGBA1C 6.3 12/21/2016   HGBA1C 6.3 08/12/2016   Lab Results  Component Value Date   MICROALBUR 0.7 04/26/2017   LDLCALC 75 03/03/2016   CREATININE 1.42 04/26/2017   Lab on 04/26/2017  Component Date Value Ref Range Status  . TSH 04/26/2017 1.93  0.35 - 4.50 uIU/mL Final  . Microalb, Ur 04/26/2017 0.7  0.0 - 1.9 mg/dL Final  . Creatinine,U 40/98/1191 125.9  mg/dL Final  . Microalb Creat Ratio 04/26/2017 0.6  0.0 - 30.0 mg/g Final  . Sodium 04/26/2017 142  135 - 145 mEq/L Final  . Potassium 04/26/2017 4.0  3.5 - 5.1 mEq/L Final  . Chloride 04/26/2017 103  96 - 112 mEq/L Final  . CO2 04/26/2017 28  19 - 32 mEq/L Final  . Glucose, Bld 04/26/2017 129* 70 - 99 mg/dL Final  . BUN 47/82/9562 19  6 - 23 mg/dL Final  . Creatinine, Ser 04/26/2017 1.42  0.40 - 1.50 mg/dL Final  . Total Bilirubin 04/26/2017 0.8  0.2 - 1.2 mg/dL Final  .  Alkaline Phosphatase 04/26/2017 45  39 - 117 U/L Final  . AST 04/26/2017 20  0 - 37 U/L Final  . ALT 04/26/2017 18  0 - 53 U/L Final  . Total Protein 04/26/2017 7.0  6.0 - 8.3 g/dL Final  . Albumin 40/98/1191 4.1  3.5 - 5.2 g/dL Final  . Calcium 47/82/9562 9.9  8.4 - 10.5 mg/dL Final  . GFR 13/09/6576 50.41* >60.00 mL/min Final  . Hgb A1c MFr Bld 04/26/2017 6.6* 4.6 - 6.5 % Final   Glycemic Control Guidelines for People with Diabetes:Non Diabetic:  <6%Goal of Therapy: <7%Additional Action Suggested:  >8%      Allergies as of 04/28/2017      Reactions   Crestor [rosuvastatin]    Nightmares in high dosage   Sertraline Diarrhea   Zocor [simvastatin]    Nightmares in high dosage      Medication List        Accurate as of 04/28/17  9:42 AM. Always use your most recent med list.          aspirin EC 81  MG tablet Take 1 tablet (81 mg total) by mouth daily.   b complex vitamins capsule Take 1 capsule by mouth daily.   calcium carbonate 500 MG chewable tablet Commonly known as:  TUMS - dosed in mg elemental calcium Chew 2 tablets by mouth daily as needed. For indigestion.   Coenzyme Q10-Vitamin E 100-1 MG-UNT/5ML Syrp Take 100 mg by mouth daily. Take 2 tablespoons (100MG ) Daily   fish oil-omega-3 fatty acids 1000 MG capsule Take 1,200 mg by mouth daily. 4 1200 tablets daily   fluticasone 50 MCG/ACT nasal spray Commonly known as:  FLONASE Place 1 spray into both nostrils daily as needed for allergies or rhinitis.   gabapentin 100 MG capsule Commonly known as:  NEURONTIN Take 2 capsules (200 mg total) by mouth at bedtime.   LORazepam 0.5 MG tablet Commonly known as:  ATIVAN TK 1/2 TO 1 T PO QD PRN   losartan-hydrochlorothiazide 100-25 MG tablet Commonly known as:  HYZAAR TAKE 1 TABLET BY MOUTH DAILY   metFORMIN 500 MG 24 hr tablet Commonly known as:  GLUCOPHAGE-XR TAKE 1 TABLET BY MOUTH TWICE DAILY   metoprolol tartrate 25 MG tablet Commonly known as:  LOPRESSOR TAKE 1 TABLET BY MOUTH TWICE DAILY   rosuvastatin 20 MG tablet Commonly known as:  CRESTOR Take 20 mg by mouth every other day.   traMADol 50 MG tablet Commonly known as:  ULTRAM TAKE 1 TABLET BY MOUTH EVERY 6 HOURS AS NEEDED       Allergies:  Allergies  Allergen Reactions  . Crestor [Rosuvastatin]     Nightmares in high dosage  . Sertraline Diarrhea  . Zocor [Simvastatin]     Nightmares in high dosage    Past Medical History:  Diagnosis Date  . Anxiety   . Bunion 09/29/2011  . Cancer (HCC)    nose/Dr. Albertini;skin  . Chronic constipation   . Chronic kidney disease    stage 111  . Coronary atherosclerosis of native coronary artery    2009 LAD CIRC DES  . Depression    doesn't take any meds for this  . Diabetes mellitus    takes Januvia daily  . Dizziness   . Enlarged prostate    but  doesn't require meds at present  . GERD (gastroesophageal reflux disease)    TUMS prn  . H/O hiatal hernia   . Headache(784.0)   . History of  gout    doesn't require meds  . Hx of cardiac cath   . Hyperlipidemia    Crestor 3 x wk and Zetia daily  . Hypertension    takes Hyzaar daily  . Insomnia    doesn't require meds at present time  . Joint pain   . Onychomycosis 10/05/2012   x 10  . PONV (postoperative nausea and vomiting)    pt states extremely sick  . Shortness of breath    with exertion    Past Surgical History:  Procedure Laterality Date  . bladder cancer    . CARDIAC CATHETERIZATION  2013  . cataracts removed    . CHOLECYSTECTOMY    . COLONOSCOPY    . CORONARY ANGIOPLASTY     2 stents from 2009  . CORONARY ARTERY BYPASS GRAFT  10/29/2011   Procedure: CORONARY ARTERY BYPASS GRAFTING (CABG);  Surgeon: Kerin Perna, MD;  Location: [facility] OR;  Service: Open Heart Surgery;  Laterality: N/A;  . cyst removed from finger     right hand  . EYE SURGERY     bilateral cataracts  . HEMORRHOID SURGERY    . HERNIA REPAIR     x 2;inguinal  . KIDNEY STONE SURGERY    . LEFT HEART CATHETERIZATION WITH CORONARY ANGIOGRAM N/A 10/21/2011   Procedure: LEFT HEART CATHETERIZATION WITH CORONARY ANGIOGRAM;  Surgeon: Donato Schultz, MD;  Location[facility] CATH LAB;  Service: Cardiovascular;  Laterality: N/A;  . rotator cuff surgery     right   . skin cancer removed      Family History  Problem Relation Age of Onset  . Heart disease Mother   . Stroke Father   . Cancer Brother     Social History:  reports that he quit smoking about 33 years ago. His smoking use included cigarettes. He has a 45.00 pack-year smoking history. he has never used smokeless tobacco. He reports that he does not drink alcohol or use drugs.  Review of Systems:  HYPERTENSION:  followed by PCP and cardiologist and blood pressure is somewhat high today He does not think it is as high usually  BP Readings from Last 3  Encounters:  04/28/17 (!) 146/88  04/22/17 (!) 142/78  03/03/17 128/70    RENAL dysfunction: Minimal change in creatinine  Lab Results  Component Value Date   CREATININE 1.42 04/26/2017   CREATININE 1.37 12/21/2016   CREATININE 1.36 08/12/2016      HYPERLIPIDEMIA: The lipid abnormality consists of elevated LDL and low HDL treated with Statin drugs   Currently taking Crestor 20 mg  Also followed by PCP and cardiologist  LDL 77 in 5/18 subsequently higher when he was irregular with Crestor   Lab Results  Component Value Date   CHOL 148 03/03/2016   HDL 35.60 (L) 03/03/2016   LDLCALC 75 03/03/2016   TRIG 190.0 (H) 03/03/2016   CHOLHDL 4 03/03/2016    Thyroid nodule: The nodule has been small, had a follow-up up  ultrasound in December 2017 done by his PCP, this was stable He also has a 12 mm probably pseudo-nodule present     Examination:   BP (!) 146/88   Pulse 64   Ht 5\' 4"  (1.626 m)   Wt 187 lb 6.4 oz (85 kg)   SpO2 92%   BMI 32.17 kg/m   Body mass index is 32.17 kg/m.  .    ASSESSMENT/ PLAN:   Diabetes type 2 with  Obesity  See history of present  illness for detailed discussion of current diabetes management, blood sugar patterns and problems identified  With his age of 7 A1c of 6.6 is still excellent  Is only taking metformin  His main difficulty is not being consistent with diet or ability to lose weight However appears to be little more motivated now and he thinks he can do better with his diet Also starting to do an exercise program at the senior center where he recently  Blood sugars at home are fairly good, mildly increased fasting as expected  He will not change his metformin and continue this as monotherapy  HYPERTENSION: Followed by PCP and cardiologist and will defer any changes to them  Lipids: Regular follow-up with his PCP, recommended he take his Crestor regularly    There are no Patient Instructions on file for this visit.     Reather Littler 04/28/2017, 9:42 AM

## 2017-04-28 ENCOUNTER — Encounter: Payer: Self-pay | Admitting: Endocrinology

## 2017-04-28 ENCOUNTER — Ambulatory Visit: Payer: Medicare Other | Admitting: Endocrinology

## 2017-04-28 VITALS — BP 146/88 | HR 64 | Ht 64.0 in | Wt 187.4 lb

## 2017-04-28 DIAGNOSIS — E119 Type 2 diabetes mellitus without complications: Secondary | ICD-10-CM | POA: Diagnosis not present

## 2017-05-10 DIAGNOSIS — N401 Enlarged prostate with lower urinary tract symptoms: Secondary | ICD-10-CM | POA: Diagnosis not present

## 2017-05-10 DIAGNOSIS — N200 Calculus of kidney: Secondary | ICD-10-CM | POA: Diagnosis not present

## 2017-05-10 DIAGNOSIS — C672 Malignant neoplasm of lateral wall of bladder: Secondary | ICD-10-CM | POA: Diagnosis not present

## 2017-05-10 DIAGNOSIS — Z8042 Family history of malignant neoplasm of prostate: Secondary | ICD-10-CM | POA: Diagnosis not present

## 2017-05-17 ENCOUNTER — Other Ambulatory Visit: Payer: Self-pay | Admitting: Cardiology

## 2017-05-25 DIAGNOSIS — C672 Malignant neoplasm of lateral wall of bladder: Secondary | ICD-10-CM | POA: Diagnosis not present

## 2017-06-01 DIAGNOSIS — C672 Malignant neoplasm of lateral wall of bladder: Secondary | ICD-10-CM | POA: Diagnosis not present

## 2017-06-03 ENCOUNTER — Other Ambulatory Visit: Payer: Self-pay | Admitting: Cardiology

## 2017-06-08 DIAGNOSIS — C679 Malignant neoplasm of bladder, unspecified: Secondary | ICD-10-CM | POA: Diagnosis not present

## 2017-06-29 ENCOUNTER — Other Ambulatory Visit: Payer: Medicare Other

## 2017-07-02 ENCOUNTER — Other Ambulatory Visit: Payer: Self-pay | Admitting: Endocrinology

## 2017-07-16 ENCOUNTER — Ambulatory Visit (INDEPENDENT_AMBULATORY_CARE_PROVIDER_SITE_OTHER): Payer: Medicare Other | Admitting: Podiatry

## 2017-07-16 DIAGNOSIS — M79676 Pain in unspecified toe(s): Secondary | ICD-10-CM

## 2017-07-16 DIAGNOSIS — M79674 Pain in right toe(s): Secondary | ICD-10-CM | POA: Diagnosis not present

## 2017-07-16 DIAGNOSIS — B351 Tinea unguium: Secondary | ICD-10-CM | POA: Diagnosis not present

## 2017-07-16 DIAGNOSIS — M79675 Pain in left toe(s): Secondary | ICD-10-CM

## 2017-07-16 DIAGNOSIS — E119 Type 2 diabetes mellitus without complications: Secondary | ICD-10-CM | POA: Diagnosis not present

## 2017-07-18 NOTE — Progress Notes (Signed)
Subjective:   Patient ID: Cody Thomas, male   DOB: 84 y.o.   MRN: [id number]   HPI Patient presents with thick incurvated nailbeds 1-5 both feet that become painful and she tries to trim herself   ROS      Objective:  Physical Exam  Neurovascular status intact with thick yellow brittle nailbeds 1-5 both feet that are painful     Assessment:  Mycotic nail infection with pain 1-5 both feet     Plan:  Debride painful nailbeds 1-5 both feet with no iatrogenic bleeding noted

## 2017-08-03 ENCOUNTER — Ambulatory Visit: Payer: Medicare Other | Admitting: Pulmonary Disease

## 2017-08-03 ENCOUNTER — Encounter: Payer: Self-pay | Admitting: Pulmonary Disease

## 2017-08-03 VITALS — BP 142/80 | HR 72 | Ht 64.0 in | Wt 186.0 lb

## 2017-08-03 DIAGNOSIS — R400 Somnolence: Secondary | ICD-10-CM | POA: Diagnosis not present

## 2017-08-03 DIAGNOSIS — J432 Centrilobular emphysema: Secondary | ICD-10-CM

## 2017-08-03 DIAGNOSIS — G4733 Obstructive sleep apnea (adult) (pediatric): Secondary | ICD-10-CM | POA: Diagnosis not present

## 2017-08-03 NOTE — Patient Instructions (Signed)
Obstructive sleep apnea: Keep using CPAP every night Let us know if you would like to go for a mask fitting session at [facility] No driving while sleepy  Emphysema seen on a CT scan: As stated previously you do not have COPD but you do have emphysema. Its important that she get a flu shot every year and stay active  Follow-up in 1 year or sooner if needed

## 2017-08-03 NOTE — Progress Notes (Signed)
Subjective:    Patient ID: Cody Thomas, male    DOB: September 08, 1930, 84 y.o.   MRN: [id number]  Synopsis: First seen by [facility] in August 2016 for pulmonary nodules. He has a personal history of bladder cancer. He was a heavy smoker prior to 35 when he quit.  He smoked 2 packs per day until then for several decades.   HPI Chief Complaint  Patient presents with  . Follow-up    4 month ROV, issues with getting CPAP covered by insurance   He continues to struggle with keeping the mask on throughout the entire night but his compliance report tells [place] that he is using at 93% of the time and 70% of the time he is able to use it more than 4 hours which is good.  He says that he has a little bit of lightheadedness during the daytime which she attributes to not drinking as much fluid.  No associated chest tightness, chest pain or palpitations.  He is currently using a full facemask because it has less leak than the nasal mask.  Past Medical History:  Diagnosis Date  . Anxiety   . Bunion 09/29/2011  . Cancer (HCC)    nose/Dr. Albertini;skin  . Chronic constipation   . Chronic kidney disease    stage 111  . Coronary atherosclerosis of native coronary artery    2009 LAD CIRC DES  . Depression    doesn't take any meds for this  . Diabetes mellitus    takes Januvia daily  . Dizziness   . Enlarged prostate    but doesn't require meds at present  . GERD (gastroesophageal reflux disease)    TUMS prn  . H/O hiatal hernia   . Headache(784.0)   . History of gout    doesn't require meds  . Hx of cardiac cath   . Hyperlipidemia    Crestor 3 x wk and Zetia daily  . Hypertension    takes Hyzaar daily  . Insomnia    doesn't require meds at present time  . Joint pain   . Onychomycosis 10/05/2012   x 10  . PONV (postoperative nausea and vomiting)    pt states extremely sick  . Shortness of breath    with exertion      Review of Systems  Constitutional: Positive for  fatigue. Negative for chills and fever.  HENT: Negative for sinus pressure, sinus pain and sneezing.   Respiratory: Positive for shortness of breath. Negative for cough and wheezing.   Cardiovascular: Negative for chest pain, palpitations and leg swelling.       Objective:   Physical Exam Vitals:   08/03/17 1644  BP: (!) 142/80  Pulse: 72  SpO2: 93%  Weight: 186 lb (84.4 kg)  Height: 5\' 4"  (1.626 m)   Room air  Gen: elderly but appearing HENT: OP clear, TM's clear, neck supple PULM: CTA B, normal percussion CV: RRR, no mgr, trace edema GI: BS+, soft, nontender Derm: no cyanosis or rash Psyche: normal mood and affect    Chest imaging: CT chest images from last week were personally reviewed showing very small pulmonary nodules, the largest of which is 5 mm in the lingula no change compared to prior. CT chest 09/2016 : Stable nodules compared to the last image in 2017, Images independently reviewed, centrilobular emphysema seen.      Assessment & Plan:   OSA (obstructive sleep apnea)  Daytime somnolence  Centrilobular emphysema (HCC)  Discussion: This  has been a stable interval for him.  He has some leak from his CPAP mask which prevents him from using it all night long but his compliance report shows good compliance the majority of the time.  Plan: Obstructive sleep apnea: Keep using CPAP every night Let us know if you would like to go for a mask fitting session at [facility] No driving while sleepy  Emphysema seen on a CT scan: As stated previously you do not have COPD but you do have emphysema. Its important that she get a flu shot every year and stay active  Follow-up in 1 year or sooner if needed   Current Outpatient Medications:  .  aspirin EC 81 MG tablet, Take 1 tablet (81 mg total) by mouth daily., Disp: , Rfl:  .  b complex vitamins capsule, Take 1 capsule by mouth daily., Disp: , Rfl:  .  calcium carbonate (TUMS - DOSED IN MG ELEMENTAL CALCIUM) 500 MG  chewable tablet, Chew 2 tablets by mouth daily as needed. For indigestion. , Disp: , Rfl:  .  Coenzyme Q10-Vitamin E 100-1 MG-UNT/5ML SYRP, Take 100 mg by mouth daily. Take 2 tablespoons (100MG ) Daily, Disp: , Rfl:  .  fish oil-omega-3 fatty acids 1000 MG capsule, Take 1,200 mg by mouth daily. 4 1200 tablets daily, Disp: , Rfl:  .  fluticasone (FLONASE) 50 MCG/ACT nasal spray, Place 1 spray into both nostrils daily as needed for allergies or rhinitis., Disp: , Rfl:  .  gabapentin (NEURONTIN) 100 MG capsule, Take 2 capsules (200 mg total) by mouth at bedtime., Disp: 60 capsule, Rfl: 6 .  LORazepam (ATIVAN) 0.5 MG tablet, TK 1/2 TO 1 T PO QD PRN, Disp: , Rfl: 0 .  losartan-hydrochlorothiazide (HYZAAR) 100-25 MG tablet, TAKE 1 TABLET BY MOUTH DAILY, Disp: 90 tablet, Rfl: 3 .  metFORMIN (GLUCOPHAGE-XR) 500 MG 24 hr tablet, TAKE 1 TABLET BY MOUTH TWICE DAILY, Disp: 180 tablet, Rfl: 0 .  metoprolol tartrate (LOPRESSOR) 25 MG tablet, Take 1 tablet (25 mg total) by mouth 2 (two) times daily., Disp: 180 tablet, Rfl: 3 .  rosuvastatin (CRESTOR) 20 MG tablet, Take 20 mg by mouth every other day., Disp: , Rfl:  .  traMADol (ULTRAM) 50 MG tablet, TAKE 1 TABLET BY MOUTH EVERY 6 HOURS AS NEEDED, Disp: 30 tablet, Rfl: 0

## 2017-08-24 ENCOUNTER — Other Ambulatory Visit (INDEPENDENT_AMBULATORY_CARE_PROVIDER_SITE_OTHER): Payer: Medicare Other

## 2017-08-24 DIAGNOSIS — E119 Type 2 diabetes mellitus without complications: Secondary | ICD-10-CM

## 2017-08-24 LAB — HEMOGLOBIN A1C: Hgb A1c MFr Bld: 6.6 % — ABNORMAL HIGH (ref 4.6–6.5)

## 2017-08-24 LAB — BASIC METABOLIC PANEL
BUN: 20 mg/dL (ref 6–23)
CO2: 29 mEq/L (ref 19–32)
Calcium: 9.8 mg/dL (ref 8.4–10.5)
Chloride: 102 mEq/L (ref 96–112)
Creatinine, Ser: 1.36 mg/dL (ref 0.40–1.50)
GFR: 52.95 mL/min — ABNORMAL LOW (ref >60.00)
Glucose, Bld: 143 mg/dL — ABNORMAL HIGH (ref 70–99)
Potassium: 4 mEq/L (ref 3.5–5.1)
Sodium: 139 mEq/L (ref 135–145)

## 2017-08-26 ENCOUNTER — Encounter: Payer: Self-pay | Admitting: Endocrinology

## 2017-08-26 ENCOUNTER — Ambulatory Visit: Payer: Medicare Other | Admitting: Endocrinology

## 2017-08-26 VITALS — BP 132/70 | HR 63 | Ht 64.0 in | Wt 185.0 lb

## 2017-08-26 DIAGNOSIS — E1165 Type 2 diabetes mellitus with hyperglycemia: Secondary | ICD-10-CM

## 2017-08-26 DIAGNOSIS — E041 Nontoxic single thyroid nodule: Secondary | ICD-10-CM

## 2017-08-26 NOTE — Progress Notes (Signed)
Patient ID: Cody Thomas, male   DOB: 10-11-1930, 84 y.o.   MRN: [id number]   Reason for Appointment: Diabetes follow-up   History of Present Illness   Diagnosis: Type 2 DIABETES MELITUS  He has had mild diabetes which has been previously erratic partly because of dietary inconsistency and difficulty with weight loss He has not been continued on metformin because of renal dysfunction Was given low-dose Januvia instead but this was too expensive for him even though it was helping his control In 2014 he was given low dose Amaryl instead which appears to be keeping his blood sugars controlled  His blood sugars were not well controlled when he was seen in 1/16 with A1c 7.8  Subsequently Amaryl was stopped  Recent history:  Since 1/16 he has been on metformin and he is  taking 1000 mg daily without any side effects.  A1c is the same at 6.6, Previous range 6.2-8 percent . Current blood sugar patterns, problems and management:  He is doing fairly well with current regimen of metformin alone and has only rare high blood sugars at night after supper  He still does not try to be consistent with diet and frequently eating sweets, only sometimes better with controlling these  Although he is not doing any formal exercise he tries to stay more active working around the house and outside  His fasting readings are fairly close to normal  No side effects from metformin and his renal function is stable             Monitors blood glucose: Once a day or less .    Glucometer:   Accu-Chek         Blood Glucose readings from download as follows:  FASTING range 96-135  8-11 PM blood sugars at night range 123-190 with average about 160 Overall AVERAGE 133   Dietitian consultation last done several years ago  Breakfast meals: Sometimes Cheerios cereal, oatmeal, sometimes an egg, otherwise toast and apple butter Cookies for snacks sometimes at night  Physical activity: exercise:  not doing any formal exercise   Wt Readings from Last 3 Encounters:  08/26/17 185 lb (83.9 kg)  08/03/17 186 lb (84.4 kg)  04/28/17 187 lb 6.4 oz (85 kg)   DM LABS:  Lab Results  Component Value Date   HGBA1C 6.6 (H) 08/24/2017   HGBA1C 6.6 (H) 04/26/2017   HGBA1C 6.3 12/21/2016   Lab Results  Component Value Date   MICROALBUR 0.7 04/26/2017   LDLCALC 75 03/03/2016   CREATININE 1.36 08/24/2017   Lab on 08/24/2017  Component Date Value Ref Range Status  . Sodium 08/24/2017 139  135 - 145 mEq/L Final  . Potassium 08/24/2017 4.0  3.5 - 5.1 mEq/L Final  . Chloride 08/24/2017 102  96 - 112 mEq/L Final  . CO2 08/24/2017 29  19 - 32 mEq/L Final  . Glucose, Bld 08/24/2017 143* 70 - 99 mg/dL Final  . BUN 08/30/1599 20  6 - 23 mg/dL Final  . Creatinine, Ser 08/24/2017 1.36  0.40 - 1.50 mg/dL Final  . Calcium 09/32/3557 9.8  8.4 - 10.5 mg/dL Final  . GFR 32/20/2542 52.95* >60.00 mL/min Final  . Hgb A1c MFr Bld 08/24/2017 6.6* 4.6 - 6.5 % Final   Glycemic Control Guidelines for People with Diabetes:Non Diabetic:  <6%Goal of Therapy: <7%Additional Action Suggested:  >8%      Allergies as of 08/26/2017      Reactions   Crestor [rosuvastatin]  Nightmares in high dosage   Sertraline Diarrhea   Zocor [simvastatin]    Nightmares in high dosage      Medication List        Accurate as of 08/26/17 10:53 AM. Always use your most recent med list.          aspirin EC 81 MG tablet Take 1 tablet (81 mg total) by mouth daily.   b complex vitamins capsule Take 1 capsule by mouth daily.   calcium carbonate 500 MG chewable tablet Commonly known as:  TUMS - dosed in mg elemental calcium Chew 2 tablets by mouth daily as needed. For indigestion.   Coenzyme Q10-Vitamin E 100-1 MG-UNT/5ML Syrp Take 100 mg by mouth daily. Take 2 tablespoons (100MG ) Daily   fish oil-omega-3 fatty acids 1000 MG capsule Take 1,200 mg by mouth daily. 4 1200 tablets daily   fluticasone 50 MCG/ACT nasal  spray Commonly known as:  FLONASE Place 1 spray into both nostrils daily as needed for allergies or rhinitis.   gabapentin 100 MG capsule Commonly known as:  NEURONTIN Take 2 capsules (200 mg total) by mouth at bedtime.   LORazepam 0.5 MG tablet Commonly known as:  ATIVAN TK 1/2 TO 1 T PO QD PRN   losartan-hydrochlorothiazide 100-25 MG tablet Commonly known as:  HYZAAR TAKE 1 TABLET BY MOUTH DAILY   metFORMIN 500 MG 24 hr tablet Commonly known as:  GLUCOPHAGE-XR TAKE 1 TABLET BY MOUTH TWICE DAILY   metoprolol tartrate 25 MG tablet Commonly known as:  LOPRESSOR Take 1 tablet (25 mg total) by mouth 2 (two) times daily.   rosuvastatin 20 MG tablet Commonly known as:  CRESTOR Take 20 mg by mouth every other day.   traMADol 50 MG tablet Commonly known as:  ULTRAM TAKE 1 TABLET BY MOUTH EVERY 6 HOURS AS NEEDED       Allergies:  Allergies  Allergen Reactions  . Crestor [Rosuvastatin]     Nightmares in high dosage  . Sertraline Diarrhea  . Zocor [Simvastatin]     Nightmares in high dosage    Past Medical History:  Diagnosis Date  . Anxiety   . Bunion 09/29/2011  . Cancer (HCC)    nose/Dr. Albertini;skin  . Chronic constipation   . Chronic kidney disease    stage 111  . Coronary atherosclerosis of native coronary artery    2009 LAD CIRC DES  . Depression    doesn't take any meds for this  . Diabetes mellitus    takes Januvia daily  . Dizziness   . Enlarged prostate    but doesn't require meds at present  . GERD (gastroesophageal reflux disease)    TUMS prn  . H/O hiatal hernia   . Headache(784.0)   . History of gout    doesn't require meds  . Hx of cardiac cath   . Hyperlipidemia    Crestor 3 x wk and Zetia daily  . Hypertension    takes Hyzaar daily  . Insomnia    doesn't require meds at present time  . Joint pain   . Onychomycosis 10/05/2012   x 10  . PONV (postoperative nausea and vomiting)    pt states extremely sick  . Shortness of breath      with exertion    Past Surgical History:  Procedure Laterality Date  . bladder cancer    . CARDIAC CATHETERIZATION  2013  . cataracts removed    . CHOLECYSTECTOMY    . COLONOSCOPY    .  CORONARY ANGIOPLASTY     2 stents from 2009  . CORONARY ARTERY BYPASS GRAFT  10/29/2011   Procedure: CORONARY ARTERY BYPASS GRAFTING (CABG);  Surgeon: Kerin Perna, MD;  Location: [facility] OR;  Service: Open Heart Surgery;  Laterality: N/A;  . cyst removed from finger     right hand  . EYE SURGERY     bilateral cataracts  . HEMORRHOID SURGERY    . HERNIA REPAIR     x 2;inguinal  . KIDNEY STONE SURGERY    . LEFT HEART CATHETERIZATION WITH CORONARY ANGIOGRAM N/A 10/21/2011   Procedure: LEFT HEART CATHETERIZATION WITH CORONARY ANGIOGRAM;  Surgeon: Donato Schultz, MD;  Location: [facility] CATH LAB;  Service: Cardiovascular;  Laterality: N/A;  . rotator cuff surgery     right   . skin cancer removed      Family History  Problem Relation Age of Onset  . Heart disease Mother   . Stroke Father   . Cancer Brother     Social History:  reports that he quit smoking about 33 years ago. His smoking use included cigarettes. He has a 45.00 pack-year smoking history. He has never used smokeless tobacco. He reports that he does not drink alcohol or use drugs.  Review of Systems:  HYPERTENSION:  followed by PCP and cardiologist and blood pressure is somewhat high today He does not think it is as high usually  BP Readings from Last 3 Encounters:  08/26/17 132/70  08/03/17 (!) 142/80  04/28/17 (!) 146/88    RENAL dysfunction: Minimal change in creatinine  Lab Results  Component Value Date   CREATININE 1.36 08/24/2017   CREATININE 1.42 04/26/2017   CREATININE 1.37 12/21/2016      HYPERLIPIDEMIA: The lipid abnormality consists of elevated LDL and low HDL treated with Statin drugs   Currently taking Crestor 20 mg, regular with this recently  Lipids followed by PCP and cardiologist  LDL 77 in 5/18  subsequently higher when he was irregular with Crestor   Lab Results  Component Value Date   CHOL 148 03/03/2016   HDL 35.60 (L) 03/03/2016   LDLCALC 75 03/03/2016   TRIG 190.0 (H) 03/03/2016   CHOLHDL 4 03/03/2016    Thyroid nodule: The nodule has been small, had a follow-up up  ultrasound in December 2017 done by his PCP, this was stable He also has a 12 mm probably pseudo-nodule present     Examination:   BP 132/70 (BP Location: Left Arm, Patient Position: Sitting, Cuff Size: Normal)   Pulse 63   Ht 5\' 4"  (1.626 m)   Wt 185 lb (83.9 kg)   SpO2 90%   BMI 31.76 kg/m   Body mass index is 31.76 kg/m.  Marland Kitchen Thyroid not palpable  ASSESSMENT/ PLAN:   Diabetes type 2 with  Obesity  See history of present illness for detailed discussion of current diabetes management, blood sugar patterns and problems identified  A1c is still stable at 6.6  This is with taking submaximal doses of metformin only Although his blood sugars are variable they are only high occasionally and he is not watching his diet consistently However considering his age and duration of diabetes his blood sugar control is still excellent Since his renal function is adequate he will continue metformin alone  HYPERTENSION: Followed by PCP and cardiologist and control appears to be good  Lipids: Regular with his Crestor recently and he needs to his labs checked by his PCP on the next visit  There are no Patient Instructions on file for this visit.    Reather Littler 08/26/2017, 10:53 AM

## 2017-08-31 ENCOUNTER — Other Ambulatory Visit (INDEPENDENT_AMBULATORY_CARE_PROVIDER_SITE_OTHER): Payer: Self-pay | Admitting: Specialist

## 2017-08-31 DIAGNOSIS — S99922A Unspecified injury of left foot, initial encounter: Secondary | ICD-10-CM

## 2017-08-31 DIAGNOSIS — M48062 Spinal stenosis, lumbar region with neurogenic claudication: Secondary | ICD-10-CM

## 2017-08-31 DIAGNOSIS — S92525A Nondisplaced fracture of medial phalanx of left lesser toe(s), initial encounter for closed fracture: Secondary | ICD-10-CM

## 2017-08-31 NOTE — Telephone Encounter (Signed)
Tramadol refill request 

## 2017-09-01 NOTE — Telephone Encounter (Signed)
I called rx to [facility] 

## 2017-09-20 ENCOUNTER — Other Ambulatory Visit: Payer: Self-pay | Admitting: Endocrinology

## 2017-10-01 DIAGNOSIS — E7800 Pure hypercholesterolemia, unspecified: Secondary | ICD-10-CM | POA: Diagnosis not present

## 2017-10-01 DIAGNOSIS — I10 Essential (primary) hypertension: Secondary | ICD-10-CM | POA: Diagnosis not present

## 2017-10-01 DIAGNOSIS — C679 Malignant neoplasm of bladder, unspecified: Secondary | ICD-10-CM | POA: Diagnosis not present

## 2017-10-01 DIAGNOSIS — F419 Anxiety disorder, unspecified: Secondary | ICD-10-CM | POA: Diagnosis not present

## 2017-10-01 DIAGNOSIS — G4730 Sleep apnea, unspecified: Secondary | ICD-10-CM | POA: Diagnosis not present

## 2017-10-01 DIAGNOSIS — I2510 Atherosclerotic heart disease of native coronary artery without angina pectoris: Secondary | ICD-10-CM | POA: Diagnosis not present

## 2017-10-01 DIAGNOSIS — E1122 Type 2 diabetes mellitus with diabetic chronic kidney disease: Secondary | ICD-10-CM | POA: Diagnosis not present

## 2017-10-01 DIAGNOSIS — J449 Chronic obstructive pulmonary disease, unspecified: Secondary | ICD-10-CM | POA: Diagnosis not present

## 2017-10-01 DIAGNOSIS — F339 Major depressive disorder, recurrent, unspecified: Secondary | ICD-10-CM | POA: Diagnosis not present

## 2017-10-01 DIAGNOSIS — N183 Chronic kidney disease, stage 3 (moderate): Secondary | ICD-10-CM | POA: Diagnosis not present

## 2017-10-29 ENCOUNTER — Ambulatory Visit (INDEPENDENT_AMBULATORY_CARE_PROVIDER_SITE_OTHER): Payer: Medicare Other | Admitting: Podiatry

## 2017-10-29 ENCOUNTER — Encounter: Payer: Self-pay | Admitting: Podiatry

## 2017-10-29 DIAGNOSIS — B351 Tinea unguium: Secondary | ICD-10-CM | POA: Diagnosis not present

## 2017-10-29 DIAGNOSIS — M79674 Pain in right toe(s): Secondary | ICD-10-CM | POA: Diagnosis not present

## 2017-10-29 DIAGNOSIS — M79675 Pain in left toe(s): Secondary | ICD-10-CM | POA: Diagnosis not present

## 2017-10-29 NOTE — Progress Notes (Signed)
Subjective:   Patient ID: Cody Thomas, male   DOB: 84 y.o.   MRN: [id number]   HPI Patient presents with elongated nailbeds of both feet that he cannot cut and they become painful   ROS      Objective:  Physical Exam  Thick yellow brittle nailbeds 1-5 both feet that are painful     Assessment:  Mycotic nail infection with pain 1-5 both feet     Plan:  Debride painful nailbeds 1-5 both feet with no iatrogenic bleeding noted

## 2017-11-08 DIAGNOSIS — Z8551 Personal history of malignant neoplasm of bladder: Secondary | ICD-10-CM | POA: Diagnosis not present

## 2017-11-29 DIAGNOSIS — Z23 Encounter for immunization: Secondary | ICD-10-CM | POA: Diagnosis not present

## 2017-12-06 ENCOUNTER — Other Ambulatory Visit: Payer: Self-pay | Admitting: Endocrinology

## 2018-01-04 ENCOUNTER — Other Ambulatory Visit (INDEPENDENT_AMBULATORY_CARE_PROVIDER_SITE_OTHER): Payer: Medicare Other

## 2018-01-04 DIAGNOSIS — E119 Type 2 diabetes mellitus without complications: Secondary | ICD-10-CM | POA: Diagnosis not present

## 2018-01-04 DIAGNOSIS — E1165 Type 2 diabetes mellitus with hyperglycemia: Secondary | ICD-10-CM | POA: Diagnosis not present

## 2018-01-04 DIAGNOSIS — E041 Nontoxic single thyroid nodule: Secondary | ICD-10-CM | POA: Diagnosis not present

## 2018-01-04 LAB — BASIC METABOLIC PANEL
BUN: 17 mg/dL (ref 6–23)
CO2: 29 meq/L (ref 19–32)
Calcium: 9.5 mg/dL (ref 8.4–10.5)
Chloride: 103 meq/L (ref 96–112)
Creatinine, Ser: 1.44 mg/dL (ref 0.40–1.50)
GFR: 49.53 mL/min — ABNORMAL LOW (ref >60.00)
Glucose, Bld: 139 mg/dL — ABNORMAL HIGH (ref 70–99)
Potassium: 3.8 mEq/L (ref 3.5–5.1)
Sodium: 139 meq/L (ref 135–145)

## 2018-01-04 LAB — TSH: TSH: 2.15 u[IU]/mL (ref 0.35–4.50)

## 2018-01-04 LAB — LIPID PANEL
Cholesterol: 184 mg/dL (ref 0–200)
HDL: 34 mg/dL — ABNORMAL LOW (ref >39.00)
LDL Cholesterol: 121 mg/dL — ABNORMAL HIGH (ref 0–99)
NonHDL: 150.17
Total CHOL/HDL Ratio: 5
Triglycerides: 145 mg/dL (ref 0.0–149.0)
VLDL: 29 mg/dL (ref 0.0–40.0)

## 2018-01-04 LAB — HEMOGLOBIN A1C: Hgb A1c MFr Bld: 6.6 % — ABNORMAL HIGH (ref 4.6–6.5)

## 2018-01-06 ENCOUNTER — Encounter: Payer: Self-pay | Admitting: Endocrinology

## 2018-01-06 ENCOUNTER — Ambulatory Visit: Payer: Medicare Other | Admitting: Endocrinology

## 2018-01-06 VITALS — BP 142/80 | HR 60 | Ht 64.0 in | Wt 186.0 lb

## 2018-01-06 DIAGNOSIS — E119 Type 2 diabetes mellitus without complications: Secondary | ICD-10-CM | POA: Diagnosis not present

## 2018-01-06 DIAGNOSIS — E7800 Pure hypercholesterolemia, unspecified: Secondary | ICD-10-CM

## 2018-01-06 NOTE — Progress Notes (Signed)
Patient ID: Cody Thomas, male   DOB: [birth year], 84 y.o.   MRN: [id number]   Reason for Appointment: Diabetes follow-up   History of Present Illness   Diagnosis: Type 2 DIABETES MELITUS  He has had mild diabetes which has been previously erratic partly because of dietary inconsistency and difficulty with weight loss He has not been continued on metformin because of renal dysfunction Was given low-dose Januvia instead but this was too expensive for him even though it was helping his control In 2014 he was given low dose Amaryl instead which appears to be keeping his blood sugars controlled  His blood sugars were not well controlled when he was seen in 1/16 with A1c 7.8  Subsequently Amaryl was stopped  Recent history:  Since 1/16 he has been on metformin ER and he is  taking 500 mg twice daily  A1c is again the same at 6.6, Previous range 6.2-8 percent . Current blood sugar patterns, problems and management:  He has been irregular with checking his blood glucose and has not brought his meter today  He tends to have mildly increased fasting readings  Although he had not complained about diarrhea from metformin he is not having this about 2 or 3 times a week during the day  However he thinks this is tolerable and does not want to change the medication  Previously had tendency to hypoglycemia with Amaryl and could not take Januvia because of expense  Although his weight is stable and he is recently not eating out a lot he is not able to exercise much because of hip pain             Monitors blood glucose: Once a day or less .    Glucometer:   Accu-Chek         Blood Glucose readings recently: 120, 136  Dietitian consultation last done several years ago  Breakfast meals: Sometimes Cheerios cereal, oatmeal, sometimes an egg, toast and apple butter Cookies for snacks sometimes at night  Physical activity: exercise: not doing any formal exercise, hip pain   Wt  Readings from Last 3 Encounters:  07/03/17 186 lb (84.4 kg)  08/26/17 185 lb (83.9 kg)  08/03/17 186 lb (84.4 kg)   DM LABS:  Lab Results  Component Value Date   HGBA1C 6.6 (H) 01/04/2018   HGBA1C 6.6 (H) 08/24/2017   HGBA1C 6.6 (H) 04/26/2017   Lab Results  Component Value Date   MICROALBUR 0.7 04/26/2017   LDLCALC 121 (H) 01/04/2018   CREATININE 1.44 01/04/2018   Lab on 01/04/2018  Component Date Value Ref Range Status  . TSH 01/04/2018 2.15  0.35 - 4.50 uIU/mL Final  . Cholesterol 01/04/2018 184  0 - 200 mg/dL Final   ATP III Classification       Desirable:  < 200 mg/dL               Borderline High:  200 - 239 mg/dL          High:  > = 213 mg/dL  . Triglycerides 01/04/2018 145.0  0.0 - 149.0 mg/dL Final   Normal:  <086 mg/dLBorderline High:  150 - 199 mg/dL  . HDL 01/04/2018 34.00* >39.00 mg/dL Final  . VLDL 57/84/6962 29.0  0.0 - 40.0 mg/dL Final  . LDL Cholesterol 01/04/2018 121* 0 - 99 mg/dL Final  . Total CHOL/HDL Ratio 01/04/2018 5   Final  Men          Women1/2 Average Risk     3.4          3.3Average Risk          5.0          4.42X Average Risk          9.6          7.13X Average Risk          15.0          11.0                      . NonHDL 01/04/2018 150.17   Final   NOTE:  Non-HDL goal should be 30 mg/dL higher than patient's LDL goal (i.e. LDL goal of < 70 mg/dL, would have non-HDL goal of < 100 mg/dL)  . Sodium 01/04/2018 139  135 - 145 mEq/L Final  . Potassium 01/04/2018 3.8  3.5 - 5.1 mEq/L Final  . Chloride 01/04/2018 103  96 - 112 mEq/L Final  . CO2 01/04/2018 29  19 - 32 mEq/L Final  . Glucose, Bld 01/04/2018 139* 70 - 99 mg/dL Final  . BUN 23/53/6144 17  6 - 23 mg/dL Final  . Creatinine, Ser 01/04/2018 1.44  0.40 - 1.50 mg/dL Final  . Calcium 31/54/0086 9.5  8.4 - 10.5 mg/dL Final  . GFR 76/19/5093 49.53* >60.00 mL/min Final  . Hgb A1c MFr Bld 01/04/2018 6.6* 4.6 - 6.5 % Final   Glycemic Control Guidelines for People with Diabetes:Non  Diabetic:  <6%Goal of Therapy: <7%Additional Action Suggested:  >8%      Allergies as of 01/06/2018      Reactions   Crestor [rosuvastatin]    Nightmares in high dosage   Sertraline Diarrhea   Zocor [simvastatin]    Nightmares in high dosage      Medication List        Accurate as of 01/06/18 10:11 AM. Always use your most recent med list.          aspirin EC 81 MG tablet Take 1 tablet (81 mg total) by mouth daily.   b complex vitamins capsule Take 1 capsule by mouth daily.   calcium carbonate 500 MG chewable tablet Commonly known as:  TUMS - dosed in mg elemental calcium Chew 2 tablets by mouth daily as needed. For indigestion.   Coenzyme Q10-Vitamin E 100-1 MG-UNT/5ML Syrp Take 100 mg by mouth daily. Take 2 tablespoons (100MG ) Daily   fish oil-omega-3 fatty acids 1000 MG capsule Take 1,200 mg by mouth daily. 4 1200 tablets daily   fluticasone 50 MCG/ACT nasal spray Commonly known as:  FLONASE Place 1 spray into both nostrils daily as needed for allergies or rhinitis.   gabapentin 100 MG capsule Commonly known as:  NEURONTIN Take 2 capsules (200 mg total) by mouth at bedtime.   LORazepam 0.5 MG tablet Commonly known as:  ATIVAN TK 1/2 TO 1 T PO QD PRN   losartan-hydrochlorothiazide 100-25 MG tablet Commonly known as:  HYZAAR TAKE 1 TABLET BY MOUTH DAILY   metFORMIN 500 MG 24 hr tablet Commonly known as:  GLUCOPHAGE-XR TAKE 1 TABLET BY MOUTH TWICE DAILY   metoprolol tartrate 25 MG tablet Commonly known as:  LOPRESSOR Take 1 tablet (25 mg total) by mouth 2 (two) times daily.   rosuvastatin 20 MG tablet Commonly known as:  CRESTOR Take 20 mg by mouth every other day.   traMADol 50  MG tablet Commonly known as:  ULTRAM TAKE 1 TABLET BY MOUTH EVERY 6 HOURS AS NEEDED       Allergies:  Allergies  Allergen Reactions  . Crestor [Rosuvastatin]     Nightmares in high dosage  . Sertraline Diarrhea  . Zocor [Simvastatin]     Nightmares in high dosage     Past Medical History:  Diagnosis Date  . Anxiety   . Bunion 09/29/2011  . Cancer (HCC)    nose/Dr. Albertini;skin  . Chronic constipation   . Chronic kidney disease    stage 111  . Coronary atherosclerosis of native coronary artery    2009 LAD CIRC DES  . Depression    doesn't take any meds for this  . Diabetes mellitus    takes Januvia daily  . Dizziness   . Enlarged prostate    but doesn't require meds at present  . GERD (gastroesophageal reflux disease)    TUMS prn  . H/O hiatal hernia   . Headache(784.0)   . History of gout    doesn't require meds  . Hx of cardiac cath   . Hyperlipidemia    Crestor 3 x wk and Zetia daily  . Hypertension    takes Hyzaar daily  . Insomnia    doesn't require meds at present time  . Joint pain   . Onychomycosis 10/05/2012   x 10  . PONV (postoperative nausea and vomiting)    pt states extremely sick  . Shortness of breath    with exertion    Past Surgical History:  Procedure Laterality Date  . bladder cancer    . CARDIAC CATHETERIZATION  2013  . cataracts removed    . CHOLECYSTECTOMY    . COLONOSCOPY    . CORONARY ANGIOPLASTY     2 stents from 2009  . CORONARY ARTERY BYPASS GRAFT  10/29/2011   Procedure: CORONARY ARTERY BYPASS GRAFTING (CABG);  Surgeon: Kerin Perna, MD;  Location: [facility] OR;  Service: Open Heart Surgery;  Laterality: N/A;  . cyst removed from finger     right hand  . EYE SURGERY     bilateral cataracts  . HEMORRHOID SURGERY    . HERNIA REPAIR     x 2;inguinal  . KIDNEY STONE SURGERY    . LEFT HEART CATHETERIZATION WITH CORONARY ANGIOGRAM N/A 10/21/2011   Procedure: LEFT HEART CATHETERIZATION WITH CORONARY ANGIOGRAM;  Surgeon: Donato Schultz, MD;  Location: [facility] CATH LAB;  Service: Cardiovascular;  Laterality: N/A;  . rotator cuff surgery     right   . skin cancer removed      Family History  Problem Relation Age of Onset  . Heart disease Mother   . Stroke Father   . Cancer Brother     Social  History:  reports that he quit smoking about 33 years ago. His smoking use included cigarettes. He has a 45.00 pack-year smoking history. He has never used smokeless tobacco. He reports that he does not drink alcohol or use drugs.  Review of Systems:  HYPERTENSION:  followed by PCP and cardiologist and blood pressure is high normal today   BP Readings from Last 3 Encounters:  07/03/17 (!) 142/80  08/26/17 132/70  08/03/17 (!) 142/80    RENAL dysfunction: Minimal recent increase in creatinine:  Lab Results  Component Value Date   CREATININE 1.44 01/04/2018   CREATININE 1.36 08/24/2017   CREATININE 1.42 04/26/2017     HYPERLIPIDEMIA: The lipid abnormality consists of elevated LDL and  low HDL treated with Statin drugs   Currently taking Crestor 20 mg, irregular with this recently, he says he has not taken it because he is lazy and LDL has gone up significantly  Lipids followed by PCP and cardiologist    Lab Results  Component Value Date   CHOL 184 01/04/2018   HDL 34.00 (L) 01/04/2018   LDLCALC 121 (H) 01/04/2018   TRIG 145.0 01/04/2018   CHOLHDL 5 01/04/2018    Thyroid nodule: This has been insignificant in the past Had a follow-up up  ultrasound in December 2017 done by his PCP, this was stable He also has a 12 mm probably pseudo-nodule present     Examination:   BP (!) 142/80   Pulse 60   Ht 5\' 4"  (1.626 m)   Wt 186 lb (84.4 kg)   SpO2 95%   BMI 31.93 kg/m   Body mass index is 31.93 kg/m.  Marland Kitchen Thyroid not palpable  ASSESSMENT/ PLAN:   Diabetes type 2 with  Obesity  See history of present illness for detailed discussion of current diabetes management, blood sugar patterns and problems identified  A1c is still stable at 6.6  He has been on metformin only, total 1000 mg daily for quite some time He thinks he is having diarrhea from metformin although this is intermittent Not clear if he has any high readings after meals but his fasting readings are about  120-130 His compliance with diet is variable as before and he has not lost any weight Also limited in his ability to exercise  Since he does not have many other options with generic medications he will stay with metformin and he is agreeable to do this He can try taking this at supper and bedtime and not in the morning to avoid diarrhea during the daytime He will call if he has persistent symptoms  Reminded him to check sugars more often after meals and bring his monitor He will be back in 3 months  HYPERTENSION: Controlled  Renal function: Stable  Lipids: Has not been regular with his Crestor recently and LDL has gone up significantly Reminded him to go back on this consistently     There are no Patient Instructions on file for this visit.    Reather Littler 01/06/2018, 10:11 AM

## 2018-01-06 NOTE — Patient Instructions (Addendum)
Take 1 Metformin at supper and 1 at bedtime  Restart Crestor daily

## 2018-02-01 ENCOUNTER — Ambulatory Visit: Payer: Medicare Other | Admitting: Podiatry

## 2018-02-01 DIAGNOSIS — R21 Rash and other nonspecific skin eruption: Secondary | ICD-10-CM | POA: Diagnosis not present

## 2018-02-01 DIAGNOSIS — B028 Zoster with other complications: Secondary | ICD-10-CM | POA: Diagnosis not present

## 2018-02-01 DIAGNOSIS — B0231 Zoster conjunctivitis: Secondary | ICD-10-CM | POA: Diagnosis not present

## 2018-02-01 DIAGNOSIS — B0059 Other herpesviral disease of eye: Secondary | ICD-10-CM | POA: Diagnosis not present

## 2018-02-03 DIAGNOSIS — B029 Zoster without complications: Secondary | ICD-10-CM | POA: Diagnosis not present

## 2018-02-04 DIAGNOSIS — H01001 Unspecified blepharitis right upper eyelid: Secondary | ICD-10-CM | POA: Diagnosis not present

## 2018-02-04 DIAGNOSIS — H35033 Hypertensive retinopathy, bilateral: Secondary | ICD-10-CM | POA: Diagnosis not present

## 2018-02-04 DIAGNOSIS — B0059 Other herpesviral disease of eye: Secondary | ICD-10-CM | POA: Diagnosis not present

## 2018-02-04 DIAGNOSIS — E119 Type 2 diabetes mellitus without complications: Secondary | ICD-10-CM | POA: Diagnosis not present

## 2018-02-07 ENCOUNTER — Ambulatory Visit (INDEPENDENT_AMBULATORY_CARE_PROVIDER_SITE_OTHER): Payer: Medicare Other | Admitting: Podiatry

## 2018-02-07 DIAGNOSIS — M79674 Pain in right toe(s): Secondary | ICD-10-CM | POA: Diagnosis not present

## 2018-02-07 DIAGNOSIS — B351 Tinea unguium: Secondary | ICD-10-CM | POA: Diagnosis not present

## 2018-02-07 DIAGNOSIS — M79675 Pain in left toe(s): Secondary | ICD-10-CM | POA: Diagnosis not present

## 2018-02-07 NOTE — Patient Instructions (Signed)

## 2018-02-08 ENCOUNTER — Encounter: Payer: Self-pay | Admitting: Podiatry

## 2018-02-08 NOTE — Progress Notes (Signed)
Subjective: Cody Thomas presents today for diabetic foot care follow up with painful, thick toenails 1-5 b/l that he cannot cut and which interfere with daily activities.  Pain is aggravated when wearing enclosed shoe gear.  Cody Thomas states he is recovering from shingles which affected the left side of his face and left eye. He has follow up next week for this, but he feels better now.   Current Outpatient Medications:  .  aspirin EC 81 MG tablet, Take 1 tablet (81 mg total) by mouth daily., Disp: , Rfl:  .  b complex vitamins capsule, Take 1 capsule by mouth daily., Disp: , Rfl:  .  calcium carbonate (TUMS - DOSED IN MG ELEMENTAL CALCIUM) 500 MG chewable tablet, Chew 2 tablets by mouth daily as needed. For indigestion. , Disp: , Rfl:  .  Coenzyme Q10-Vitamin E 100-1 MG-UNT/5ML SYRP, Take 100 mg by mouth daily. Take 2 tablespoons (100MG ) Daily, Disp: , Rfl:  .  erythromycin ophthalmic ointment, APPLY TO LEFT EYE BID AS DIRECTED, Disp: , Rfl: 0 .  fish oil-omega-3 fatty acids 1000 MG capsule, Take 1,200 mg by mouth daily. 4 1200 tablets daily, Disp: , Rfl:  .  fluticasone (FLONASE) 50 MCG/ACT nasal spray, Place 1 spray into both nostrils daily as needed for allergies or rhinitis., Disp: , Rfl:  .  gabapentin (NEURONTIN) 100 MG capsule, Take 2 capsules (200 mg total) by mouth at bedtime., Disp: 60 capsule, Rfl: 6 .  LORazepam (ATIVAN) 0.5 MG tablet, TK 1/2 TO 1 T PO QD PRN, Disp: , Rfl: 0 .  losartan-hydrochlorothiazide (HYZAAR) 100-25 MG tablet, TAKE 1 TABLET BY MOUTH DAILY, Disp: 90 tablet, Rfl: 3 .  metFORMIN (GLUCOPHAGE-XR) 500 MG 24 hr tablet, TAKE 1 TABLET BY MOUTH TWICE DAILY, Disp: 180 tablet, Rfl: 0 .  metoprolol tartrate (LOPRESSOR) 25 MG tablet, Take 1 tablet (25 mg total) by mouth 2 (two) times daily., Disp: 180 tablet, Rfl: 3 .  prednisoLONE acetate (PRED FORTE) 1 % ophthalmic suspension, PLACE 1 DROP IN THE LEFT EYE BID, Disp: , Rfl: 0 .  rosuvastatin (CRESTOR) 20 MG tablet,  Take 20 mg by mouth every other day., Disp: , Rfl:  .  traMADol (ULTRAM) 50 MG tablet, TAKE 1 TABLET BY MOUTH EVERY 6 HOURS AS NEEDED, Disp: 30 tablet, Rfl: 0 .  valACYclovir (VALTREX) 1000 MG tablet, TK 1 T PO TID FOR 10 DAYS, Disp: , Rfl: 1  Allergies  Allergen Reactions  . Crestor [Rosuvastatin]     Nightmares in high dosage  . Sertraline Diarrhea  . Zocor [Simvastatin]     Nightmares in high dosage    Objective:  Vascular Examination: Capillary refill time immediate x 10 digits Dorsalis pedis and Posterior tibial pulses palpable b/l Digital hair absent x 10 digits Skin temperature gradient WNL b/l  Dermatological Examination: Skin atrophy noted b/l LE  Toenails 1-5 b/l discolored, thick, dystrophic with subungual debris and pain with palpation to nailbeds due to thickness of nails.  Musculoskeletal: Muscle strength 5/5 to all LE muscle groups  HAV with bunion b/l LE  Neurological: Sensation intact with 10 gram monofilament. Vibratory sensation intact.  Assessment: 1. Painful onychomycosis toenails 1-5 b/l  2. NIDDM  Plan: 1. Toenails 1-5 b/l were debrided in length and girth with slight bleeding left 3rd digit which was treated with Lumicaine Hemostatic solution. No further treatment required by patient. 2. Patient to continue soft, supportive shoe gear 3. Patient to report any pedal injuries to medical professional immediately.  4. Follow up 3 months. Patient/POA to call should there be a concern in the interim.

## 2018-02-15 DIAGNOSIS — B0059 Other herpesviral disease of eye: Secondary | ICD-10-CM | POA: Diagnosis not present

## 2018-02-15 DIAGNOSIS — H02102 Unspecified ectropion of right lower eyelid: Secondary | ICD-10-CM | POA: Diagnosis not present

## 2018-03-06 ENCOUNTER — Other Ambulatory Visit: Payer: Self-pay | Admitting: Endocrinology

## 2018-04-12 DIAGNOSIS — I10 Essential (primary) hypertension: Secondary | ICD-10-CM | POA: Diagnosis not present

## 2018-04-12 DIAGNOSIS — Z0000 Encounter for general adult medical examination without abnormal findings: Secondary | ICD-10-CM | POA: Diagnosis not present

## 2018-04-12 DIAGNOSIS — E1122 Type 2 diabetes mellitus with diabetic chronic kidney disease: Secondary | ICD-10-CM | POA: Diagnosis not present

## 2018-04-12 DIAGNOSIS — E7800 Pure hypercholesterolemia, unspecified: Secondary | ICD-10-CM | POA: Diagnosis not present

## 2018-04-12 DIAGNOSIS — N183 Chronic kidney disease, stage 3 (moderate): Secondary | ICD-10-CM | POA: Diagnosis not present

## 2018-05-05 ENCOUNTER — Other Ambulatory Visit (INDEPENDENT_AMBULATORY_CARE_PROVIDER_SITE_OTHER): Payer: Medicare Other

## 2018-05-05 DIAGNOSIS — E119 Type 2 diabetes mellitus without complications: Secondary | ICD-10-CM

## 2018-05-05 DIAGNOSIS — E7800 Pure hypercholesterolemia, unspecified: Secondary | ICD-10-CM

## 2018-05-05 LAB — MICROALBUMIN / CREATININE URINE RATIO
Creatinine,U: 125.8 mg/dL
Microalb Creat Ratio: 0.6 mg/g (ref 0.0–30.0)
Microalb, Ur: <0.7 mg/dL (ref 0.0–1.9)

## 2018-05-05 LAB — COMPREHENSIVE METABOLIC PANEL
ALT: 19 U/L (ref 0–53)
AST: 22 U/L (ref 0–37)
Albumin: 4.4 g/dL (ref 3.5–5.2)
Alkaline Phosphatase: 44 U/L (ref 39–117)
BUN: 21 mg/dL (ref 6–23)
CO2: 31 meq/L (ref 19–32)
Calcium: 9.6 mg/dL (ref 8.4–10.5)
Chloride: 101 mEq/L (ref 96–112)
Creatinine, Ser: 1.46 mg/dL (ref 0.40–1.50)
GFR: 45.82 mL/min — ABNORMAL LOW (ref >60.00)
Glucose, Bld: 131 mg/dL — ABNORMAL HIGH (ref 70–99)
Potassium: 4.1 mEq/L (ref 3.5–5.1)
Sodium: 138 mEq/L (ref 135–145)
Total Bilirubin: 0.9 mg/dL (ref 0.2–1.2)
Total Protein: 7.3 g/dL (ref 6.0–8.3)

## 2018-05-05 LAB — HEMOGLOBIN A1C: Hgb A1c MFr Bld: 6.4 % (ref 4.6–6.5)

## 2018-05-05 LAB — LIPID PANEL
Cholesterol: 109 mg/dL (ref 0–200)
HDL: 38.1 mg/dL — ABNORMAL LOW (ref >39.00)
LDL Cholesterol: 49 mg/dL (ref 0–99)
NonHDL: 70.63
Total CHOL/HDL Ratio: 3
Triglycerides: 110 mg/dL (ref 0.0–149.0)
VLDL: 22 mg/dL (ref 0.0–40.0)

## 2018-05-09 ENCOUNTER — Ambulatory Visit (INDEPENDENT_AMBULATORY_CARE_PROVIDER_SITE_OTHER): Payer: Medicare Other | Admitting: Podiatry

## 2018-05-09 ENCOUNTER — Encounter: Payer: Self-pay | Admitting: Podiatry

## 2018-05-09 DIAGNOSIS — N400 Enlarged prostate without lower urinary tract symptoms: Secondary | ICD-10-CM | POA: Diagnosis not present

## 2018-05-09 DIAGNOSIS — B351 Tinea unguium: Secondary | ICD-10-CM | POA: Diagnosis not present

## 2018-05-09 DIAGNOSIS — E119 Type 2 diabetes mellitus without complications: Secondary | ICD-10-CM

## 2018-05-09 DIAGNOSIS — Z960 Presence of urogenital implants: Secondary | ICD-10-CM | POA: Diagnosis not present

## 2018-05-09 DIAGNOSIS — M79674 Pain in right toe(s): Secondary | ICD-10-CM | POA: Diagnosis not present

## 2018-05-09 DIAGNOSIS — M79675 Pain in left toe(s): Secondary | ICD-10-CM

## 2018-05-09 DIAGNOSIS — N200 Calculus of kidney: Secondary | ICD-10-CM | POA: Diagnosis not present

## 2018-05-09 DIAGNOSIS — Z8551 Personal history of malignant neoplasm of bladder: Secondary | ICD-10-CM | POA: Diagnosis not present

## 2018-05-09 NOTE — Patient Instructions (Signed)
Diabetes Mellitus and Foot Care Foot care is an important part of your health, especially when you have diabetes. Diabetes may cause you to have problems because of poor blood flow (circulation) to your feet and legs, which can cause your skin to:  Become thinner and drier.  Break more easily.  Heal more slowly.  Peel and crack. You may also have nerve damage (neuropathy) in your legs and feet, causing decreased feeling in them. This means that you may not notice minor injuries to your feet that could lead to more serious problems. Noticing and addressing any potential problems early is the best way to prevent future foot problems. How to care for your feet Foot hygiene  Wash your feet daily with warm water and mild soap. Do not use hot water. Then, pat your feet and the areas between your toes until they are completely dry. Do not soak your feet as this can dry your skin.  Trim your toenails straight across. Do not dig under them or around the cuticle. File the edges of your nails with an emery board or nail file.  Apply a moisturizing lotion or petroleum jelly to the skin on your feet and to dry, brittle toenails. Use lotion that does not contain alcohol and is unscented. Do not apply lotion between your toes. Shoes and socks  Wear clean socks or stockings every day. Make sure they are not too tight. Do not wear knee-high stockings since they may decrease blood flow to your legs.  Wear shoes that fit properly and have enough cushioning. Always look in your shoes before you put them on to be sure there are no objects inside.  To break in new shoes, wear them for just a few hours a day. This prevents injuries on your feet. Wounds, scrapes, corns, and calluses  Check your feet daily for blisters, cuts, bruises, sores, and redness. If you cannot see the bottom of your feet, use a mirror or ask someone for help.  Do not cut corns or calluses or try to remove them with medicine.  If you  find a minor scrape, cut, or break in the skin on your feet, keep it and the skin around it clean and dry. You may clean these areas with mild soap and water. Do not clean the area with peroxide, alcohol, or iodine.  If you have a wound, scrape, corn, or callus on your foot, look at it several times a day to make sure it is healing and not infected. Check for: ? Redness, swelling, or pain. ? Fluid or blood. ? Warmth. ? Pus or a bad smell. General instructions  Do not cross your legs. This may decrease blood flow to your feet.  Do not use heating pads or hot water bottles on your feet. They may burn your skin. If you have lost feeling in your feet or legs, you may not know this is happening until it is too late.  Protect your feet from hot and cold by wearing shoes, such as at the beach or on hot pavement.  Schedule a complete foot exam at least once a year (annually) or more often if you have foot problems. If you have foot problems, report any cuts, sores, or bruises to your health care provider immediately. Contact a health care provider if:  You have a medical condition that increases your risk of infection and you have any cuts, sores, or bruises on your feet.  You have an injury that is not   healing.  You have redness on your legs or feet.  You feel burning or tingling in your legs or feet.  You have pain or cramps in your legs and feet.  Your legs or feet are numb.  Your feet always feel cold.  You have pain around a toenail. Get help right away if:  You have a wound, scrape, corn, or callus on your foot and: ? You have pain, swelling, or redness that gets worse. ? You have fluid or blood coming from the wound, scrape, corn, or callus. ? Your wound, scrape, corn, or callus feels warm to the touch. ? You have pus or a bad smell coming from the wound, scrape, corn, or callus. ? You have a fever. ? You have a red line going up your leg. Summary  Check your feet every day  for cuts, sores, red spots, swelling, and blisters.  Moisturize feet and legs daily.  Wear shoes that fit properly and have enough cushioning.  If you have foot problems, report any cuts, sores, or bruises to your health care provider immediately.  Schedule a complete foot exam at least once a year (annually) or more often if you have foot problems. This information is not intended to replace advice given to you by your health care provider. Make sure you discuss any questions you have with your health care provider. Document Released: 02/14/2000 Document Revised: 03/31/2017 Document Reviewed: 03/20/2016 Elsevier Interactive Patient Education  2019 [employer]  Onychomycosis/Fungal Toenails  WHAT IS IT? An infection that lies within the keratin of your nail plate that is caused by a fungus.  WHY ME? Fungal infections affect all ages, sexes, races, and creeds.  There may be many factors that predispose you to a fungal infection such as age, coexisting medical conditions such as diabetes, or an autoimmune disease; stress, medications, fatigue, genetics, etc.  Bottom line: fungus thrives in a warm, moist environment and your shoes offer such a location.  IS IT CONTAGIOUS? Theoretically, yes.  You do not want to share shoes, nail clippers or files with someone who has fungal toenails.  Walking around barefoot in the same room or sleeping in the same bed is unlikely to transfer the organism.  It is important to realize, however, that fungus can spread easily from one nail to the next on the same foot.  HOW DO WE TREAT THIS?  There are several ways to treat this condition.  Treatment may depend on many factors such as age, medications, pregnancy, liver and kidney conditions, etc.  It is best to ask your doctor which options are available to you.  1. No treatment.   Unlike many other medical concerns, you can live with this condition.  However for many people this can be a painful condition and  may lead to ingrown toenails or a bacterial infection.  It is recommended that you keep the nails cut short to help reduce the amount of fungal nail. 2. Topical treatment.  These range from herbal remedies to prescription strength nail lacquers.  About 40-50% effective, topicals require twice daily application for approximately 9 to 12 months or until an entirely new nail has grown out.  The most effective topicals are medical grade medications available through physicians offices. 3. Oral antifungal medications.  With an 80-90% cure rate, the most common oral medication requires 3 to 4 months of therapy and stays in your system for a year as the new nail grows out.  Oral antifungal medications do require   blood work to make sure it is a safe drug for you.  A liver function panel will be performed prior to starting the medication and after the first month of treatment.  It is important to have the blood work performed to avoid any harmful side effects.  In general, this medication safe but blood work is required. 4. Laser Therapy.  This treatment is performed by applying a specialized laser to the affected nail plate.  This therapy is noninvasive, fast, and non-painful.  It is not covered by insurance and is therefore, out of pocket.  The results have been very good with a 80-95% cure rate.  [facility] is the only practice in the area to offer this therapy. 5. Permanent Nail Avulsion.  Removing the entire nail so that a new nail will not grow back. 

## 2018-05-09 NOTE — Progress Notes (Signed)
Subjective: Cody Thomas presents today with painful, thick toenails 1-5 b/l that he cannot cut and which interfere with daily activities.  Pain is aggravated when wearing enclosed shoe gear.  Leighton Ruff, MD is his PCP.   Current Outpatient Medications:  .  aspirin EC 81 MG tablet, Take 1 tablet (81 mg total) by mouth daily., Disp: , Rfl:  .  b complex vitamins capsule, Take 1 capsule by mouth daily., Disp: , Rfl:  .  calcium carbonate (TUMS - DOSED IN MG ELEMENTAL CALCIUM) 500 MG chewable tablet, Chew 2 tablets by mouth daily as needed. For indigestion. , Disp: , Rfl:  .  ciprofloxacin (CIPRO) 500 MG tablet, Take by mouth., Disp: , Rfl:  .  Coenzyme Q10-Vitamin E 100-1 MG-UNT/5ML SYRP, Take 100 mg by mouth daily. Take 2 tablespoons (100MG ) Daily, Disp: , Rfl:  .  erythromycin ophthalmic ointment, APPLY TO LEFT EYE BID AS DIRECTED, Disp: , Rfl: 0 .  fish oil-omega-3 fatty acids 1000 MG capsule, Take 1,200 mg by mouth daily. 4 1200 tablets daily, Disp: , Rfl:  .  fluticasone (FLONASE) 50 MCG/ACT nasal spray, Place 1 spray into both nostrils daily as needed for allergies or rhinitis., Disp: , Rfl:  .  gabapentin (NEURONTIN) 100 MG capsule, Take 2 capsules (200 mg total) by mouth at bedtime., Disp: 60 capsule, Rfl: 6 .  LORazepam (ATIVAN) 0.5 MG tablet, TK 1/2 TO 1 T PO QD PRN, Disp: , Rfl: 0 .  losartan-hydrochlorothiazide (HYZAAR) 100-25 MG tablet, TAKE 1 TABLET BY MOUTH DAILY, Disp: 90 tablet, Rfl: 3 .  metFORMIN (GLUCOPHAGE-XR) 500 MG 24 hr tablet, TAKE 1 TABLET BY MOUTH TWICE DAILY, Disp: 180 tablet, Rfl: 0 .  metoprolol tartrate (LOPRESSOR) 25 MG tablet, Take 1 tablet (25 mg total) by mouth 2 (two) times daily., Disp: 180 tablet, Rfl: 3 .  prednisoLONE acetate (PRED FORTE) 1 % ophthalmic suspension, PLACE 1 DROP IN THE LEFT EYE BID, Disp: , Rfl: 0 .  rosuvastatin (CRESTOR) 20 MG tablet, Take 20 mg by mouth every other day., Disp: , Rfl:  .  traMADol (ULTRAM) 50 MG tablet, TAKE 1  TABLET BY MOUTH EVERY 6 HOURS AS NEEDED, Disp: 30 tablet, Rfl: 0 .  valACYclovir (VALTREX) 1000 MG tablet, TK 1 T PO TID FOR 10 DAYS, Disp: , Rfl: 1  Allergies  Allergen Reactions  . Crestor [Rosuvastatin]     Nightmares in high dosage  . Sertraline Diarrhea  . Zocor [Simvastatin]     Nightmares in high dosage    Objective:  Vascular Examination: Capillary refill time immediate x 10 digits.  Dorsalis pedis and Posterior tibial pulses palpable b/l.  Digital hair absent x 10 digits.  Skin temperature gradient WNL b/l.  Dermatological Examination: Skin noted to possess age related atrophy b/l  Toenails 1-5 b/l discolored, thick, dystrophic with subungual debris and pain with palpation to nailbeds due to thickness of nails.  Right great toe nailplate noted to be thick and incurvated at medial and lateral nail borders. Tenderness with dorsal palpation. No erythema, no edema, no drainage.  Musculoskeletal: Muscle strength 5/5 to all LE muscle groups  No gross bony deformities b/l.  No pain, crepitus or joint limitation noted with ROM.   Neurological: Sensation intact with 10 gram monofilament. Vibratory sensation intact.  Assessment: Painful onychomycosis toenails 1-5 b/l  Ingrown mycotic toenail right great toe  Plan: 1. Toenails 1-5 b/l were debrided in length and girth without iatrogenic bleeding. Right great toe nailplate debrided and  curretaged. Border cleansed with alcohol. Polysporin Ointment applied. Patient noted relief post-debridement today. Instructed patient if he experiences discomfort to please call the office so we can re-assess. He assures me his toe feels better today. 2. Patient to continue soft, supportive shoe gear daily. 3. Patient to report any pedal injuries to medical professional immediately. 4. Follow up 3 months.  5. Patient/POA to call should there be a concern in the interim.

## 2018-05-11 ENCOUNTER — Ambulatory Visit: Payer: Medicare Other | Admitting: Endocrinology

## 2018-05-11 ENCOUNTER — Other Ambulatory Visit: Payer: Self-pay

## 2018-05-11 ENCOUNTER — Encounter: Payer: Self-pay | Admitting: Endocrinology

## 2018-05-11 VITALS — BP 142/82 | HR 67 | Ht 64.0 in | Wt 191.6 lb

## 2018-05-11 DIAGNOSIS — E119 Type 2 diabetes mellitus without complications: Secondary | ICD-10-CM | POA: Diagnosis not present

## 2018-05-11 DIAGNOSIS — E7800 Pure hypercholesterolemia, unspecified: Secondary | ICD-10-CM | POA: Diagnosis not present

## 2018-05-11 DIAGNOSIS — E041 Nontoxic single thyroid nodule: Secondary | ICD-10-CM

## 2018-05-11 NOTE — Progress Notes (Signed)
Patient ID: Cody Thomas, male   DOB: June 28, 1930, 85 y.o.   MRN: [id number]   Reason for Appointment: Diabetes follow-up   History of Present Illness   Diagnosis: Type 2 DIABETES MELITUS  He has had mild diabetes which has been previously erratic partly because of dietary inconsistency and difficulty with weight loss He has not been continued on metformin because of renal dysfunction Was given low-dose Januvia instead but this was too expensive for him even though it was helping his control In 2014 he was given low dose Amaryl instead which appears to be keeping his blood sugars controlled  His blood sugars were not well controlled when he was seen in 1/16 with A1c 7.8  Subsequently Amaryl was stopped  Recent history:  Since 1/16 he has been on metformin ER and he is  taking 500 mg twice daily  A1c is slightly better at 6.4 compared to 6.6, Previous range 6.2-8 percent . Current blood sugar patterns, problems and management:  He has continued on metformin alone without any side effects from this  He has gained 5 pounds  This is likely to be from inadequate activity and also probably not consistently watching portions, carbohydrates and sweets  Blood sugars may randomly be higher at times based on his diet  Has only 9 readings for the last month             Monitors blood glucose: Once a day or less .    Glucometer:   Accu-Chek         Blood Glucose readings and averages from download   PRE-MEAL Fasting Lunch Dinner Bedtime Overall  Glucose range:  117-162    118, 195   Mean/median:  130    137  141     Dietitian consultation last done several years ago  Breakfast meals: Sometimes Cheerios cereal, oatmeal, sometimes an egg, toast and apple butter Cookies for snacks sometimes at night  Physical activity: exercise: not doing any formal exercise, hip pain   Wt Readings from Last 3 Encounters:  05/11/18 191 lb 9.6 oz (86.9 kg)  07/03/17 186 lb (84.4  kg)  08/26/17 185 lb (83.9 kg)   DM LABS:  Lab Results  Component Value Date   HGBA1C 6.4 05/05/2018   HGBA1C 6.6 (H) 01/04/2018   HGBA1C 6.6 (H) 08/24/2017   Lab Results  Component Value Date   MICROALBUR <0.7 05/05/2018   LDLCALC 49 05/05/2018   CREATININE 1.46 05/05/2018   Lab on 05/05/2018  Component Date Value Ref Range Status  . Cholesterol 05/05/2018 109  0 - 200 mg/dL Final   ATP III Classification       Desirable:  < 200 mg/dL               Borderline High:  200 - 239 mg/dL          High:  > = 240 mg/dL  . Triglycerides 05/05/2018 110.0  0.0 - 149.0 mg/dL Final   Normal:  <150 mg/dLBorderline High:  150 - 199 mg/dL  . HDL 05/05/2018 38.10* >39.00 mg/dL Final  . VLDL 05/05/2018 22.0  0.0 - 40.0 mg/dL Final  . LDL Cholesterol 05/05/2018 49  0 - 99 mg/dL Final  . Total CHOL/HDL Ratio 05/05/2018 3   Final                  Men          Women1/2 Average Risk     3.4  3.3Average Risk          5.0          4.42X Average Risk          9.6          7.13X Average Risk          15.0          11.0                      . NonHDL 05/05/2018 70.63   Final   NOTE:  Non-HDL goal should be 30 mg/dL higher than patient's LDL goal (i.e. LDL goal of < 70 mg/dL, would have non-HDL goal of < 100 mg/dL)  . Microalb, Ur 05/05/2018 <0.7  0.0 - 1.9 mg/dL Final  . Creatinine,U 05/05/2018 125.8  mg/dL Final  . Microalb Creat Ratio 05/05/2018 0.6  0.0 - 30.0 mg/g Final  . Sodium 05/05/2018 138  135 - 145 mEq/L Final  . Potassium 05/05/2018 4.1  3.5 - 5.1 mEq/L Final  . Chloride 05/05/2018 101  96 - 112 mEq/L Final  . CO2 05/05/2018 31  19 - 32 mEq/L Final  . Glucose, Bld 05/05/2018 131* 70 - 99 mg/dL Final  . BUN 05/05/2018 21  6 - 23 mg/dL Final  . Creatinine, Ser 05/05/2018 1.46  0.40 - 1.50 mg/dL Final  . Total Bilirubin 05/05/2018 0.9  0.2 - 1.2 mg/dL Final  . Alkaline Phosphatase 05/05/2018 44  39 - 117 U/L Final  . AST 05/05/2018 22  0 - 37 U/L Final  . ALT 05/05/2018 19  0 - 53 U/L  Final  . Total Protein 05/05/2018 7.3  6.0 - 8.3 g/dL Final  . Albumin 05/05/2018 4.4  3.5 - 5.2 g/dL Final  . Calcium 05/05/2018 9.6  8.4 - 10.5 mg/dL Final  . GFR 05/05/2018 45.82* >60.00 mL/min Final  . Hgb A1c MFr Bld 05/05/2018 6.4  4.6 - 6.5 % Final   Glycemic Control Guidelines for People with Diabetes:Non Diabetic:  <6%Goal of Therapy: <7%Additional Action Suggested:  >8%      Allergies as of 05/11/2018      Reactions   Crestor [rosuvastatin]    Nightmares in high dosage   Sertraline Diarrhea   Zocor [simvastatin]    Nightmares in high dosage      Medication List       Accurate as of May 11, 2018  1:27 PM. Always use your most recent med list.        aspirin EC 81 MG tablet Take 1 tablet (81 mg total) by mouth daily.   b complex vitamins capsule Take 1 capsule by mouth daily.   calcium carbonate 500 MG chewable tablet Commonly known as:  TUMS - dosed in mg elemental calcium Chew 2 tablets by mouth daily as needed. For indigestion.   Coenzyme Q10-Vitamin E 100-1 MG-UNT/5ML Syrp Take 100 mg by mouth daily. Take 2 tablespoons (100MG ) Daily   erythromycin ophthalmic ointment APPLY TO LEFT EYE BID AS DIRECTED   fish oil-omega-3 fatty acids 1000 MG capsule Take 1,200 mg by mouth daily. 4 1200 tablets daily   fluticasone 50 MCG/ACT nasal spray Commonly known as:  FLONASE Place 1 spray into both nostrils daily as needed for allergies or rhinitis.   gabapentin 100 MG capsule Commonly known as:  NEURONTIN Take 2 capsules (200 mg total) by mouth at bedtime.   LORazepam 0.5 MG tablet Commonly known as:  ATIVAN TK 1/2 TO 1  T PO QD PRN   losartan-hydrochlorothiazide 100-25 MG tablet Commonly known as:  HYZAAR TAKE 1 TABLET BY MOUTH DAILY   metFORMIN 500 MG 24 hr tablet Commonly known as:  GLUCOPHAGE-XR TAKE 1 TABLET BY MOUTH TWICE DAILY   metoprolol tartrate 25 MG tablet Commonly known as:  LOPRESSOR Take 1 tablet (25 mg total) by mouth 2 (two) times  daily.   prednisoLONE acetate 1 % ophthalmic suspension Commonly known as:  PRED FORTE PLACE 1 DROP IN THE LEFT EYE BID   rosuvastatin 20 MG tablet Commonly known as:  CRESTOR Take 20 mg by mouth every other day.   traMADol 50 MG tablet Commonly known as:  ULTRAM TAKE 1 TABLET BY MOUTH EVERY 6 HOURS AS NEEDED   valACYclovir 1000 MG tablet Commonly known as:  VALTREX TK 1 T PO TID FOR 10 DAYS       Allergies:  Allergies  Allergen Reactions  . Crestor [Rosuvastatin]     Nightmares in high dosage  . Sertraline Diarrhea  . Zocor [Simvastatin]     Nightmares in high dosage    Past Medical History:  Diagnosis Date  . Anxiety   . Bunion 09/29/2011  . Cancer (HCC)    nose/Dr. Albertini;skin  . Chronic constipation   . Chronic kidney disease    stage 111  . Coronary atherosclerosis of native coronary artery    2009 LAD CIRC DES  . Depression    doesn't take any meds for this  . Diabetes mellitus    takes Januvia daily  . Dizziness   . Enlarged prostate    but doesn't require meds at present  . GERD (gastroesophageal reflux disease)    TUMS prn  . H/O hiatal hernia   . Headache(784.0)   . History of gout    doesn't require meds  . Hx of cardiac cath   . Hyperlipidemia    Crestor 3 x wk and Zetia daily  . Hypertension    takes Hyzaar daily  . Insomnia    doesn't require meds at present time  . Joint pain   . Onychomycosis 10/05/2012   x 10  . PONV (postoperative nausea and vomiting)    pt states extremely sick  . Shortness of breath    with exertion    Past Surgical History:  Procedure Laterality Date  . bladder cancer    . CARDIAC CATHETERIZATION  2013  . cataracts removed    . CHOLECYSTECTOMY    . COLONOSCOPY    . CORONARY ANGIOPLASTY     2 stents from 2009  . CORONARY ARTERY BYPASS GRAFT  10/29/2011   Procedure: CORONARY ARTERY BYPASS GRAFTING (CABG);  Surgeon: Ivin Poot, MD;  Location: [facility];  Service: Open Heart Surgery;  Laterality:  N/A;  . cyst removed from finger     right hand  . EYE SURGERY     bilateral cataracts  . HEMORRHOID SURGERY    . HERNIA REPAIR     x 2;inguinal  . KIDNEY STONE SURGERY    . LEFT HEART CATHETERIZATION WITH CORONARY ANGIOGRAM N/A 10/21/2011   Procedure: LEFT HEART CATHETERIZATION WITH CORONARY ANGIOGRAM;  Surgeon: Candee Furbish, MD;  Location: [facility] CATH LAB;  Service: Cardiovascular;  Laterality: N/A;  . rotator cuff surgery     right   . skin cancer removed      Family History  Problem Relation Age of Onset  . Heart disease Mother   . Stroke Father   . Cancer  Brother     Social History:  reports that he quit smoking about 34 years ago. His smoking use included cigarettes. He has a 45.00 pack-year smoking history. He has never used smokeless tobacco. He reports that he does not drink alcohol or use drugs.  Review of Systems:  HYPERTENSION:  followed by PCP and cardiologist and blood pressure is high normal today He thinks blood pressure is about 277+ systolic at home  BP Readings from Last 3 Encounters:  05/11/18 (!) 142/82  07/03/17 (!) 142/80  08/26/17 132/70    RENAL dysfunction: Minimal persistent increase in creatinine:  Lab Results  Component Value Date   CREATININE 1.46 05/05/2018   CREATININE 1.44 01/04/2018   CREATININE 1.36 08/24/2017     HYPERLIPIDEMIA: The lipid abnormality consists of elevated LDL and low HDL treated with Statin drugs   Currently taking Crestor 20 mg, regular with this recently Levels are improved  Lipids followed by PCP and cardiologist    Lab Results  Component Value Date   CHOL 109 05/05/2018   HDL 38.10 (L) 05/05/2018   LDLCALC 49 05/05/2018   TRIG 110.0 05/05/2018   CHOLHDL 3 05/05/2018    Thyroid nodule: This has been stable and small with previous evaluations  Had a follow-up up  ultrasound in December 2017 done by his PCP, this was stable He also has a 12 mm probably pseudo-nodule present     Examination:   BP (!)  142/82 (Patient Position: Standing)   Pulse 67   Ht 5\' 4"  (1.626 m)   Wt 191 lb 9.6 oz (86.9 kg)   SpO2 91%   BMI 32.89 kg/m   Body mass index is 32.89 kg/m.  .   ASSESSMENT/ PLAN:   Diabetes type 2 with  Obesity  See history of present illness for detailed discussion of current diabetes management, blood sugar patterns and problems identified  A1c is stable at 6.4  He has been on metformin only, total 1000 mg daily for quite some time He is tolerating this reasonably well without any excessive diarrhea  However has gained weight Blood sugars are monitored occasionally and has been as high as 195 at night  For his age his level of control is adequate  HYPERTENSION: Blood pressure is higher in the office but he thinks it is normal at home He will continue to follow-up with cardiologist  Renal function: Stable No microalbuminuria  Lipids: Has good levels now again with taking Crestor regularly and advised him to continue, has history of CAD     There are no Patient Instructions on file for this visit.    Elayne Snare 05/11/2018, 1:27 PM

## 2018-05-24 ENCOUNTER — Ambulatory Visit: Payer: Medicare Other | Admitting: Cardiology

## 2018-06-04 ENCOUNTER — Other Ambulatory Visit: Payer: Self-pay | Admitting: Endocrinology

## 2018-06-04 ENCOUNTER — Other Ambulatory Visit: Payer: Self-pay | Admitting: Cardiology

## 2018-06-20 ENCOUNTER — Ambulatory Visit: Payer: Medicare Other | Admitting: Pulmonary Disease

## 2018-06-27 ENCOUNTER — Telehealth: Payer: Self-pay | Admitting: *Deleted

## 2018-06-27 NOTE — Telephone Encounter (Signed)
Called back to speak with patient but he was in the shower.  Advised I will c/b tomorrow.

## 2018-06-27 NOTE — Telephone Encounter (Signed)
New message   Patient is returning call about virtual visit. Please call. Patient states that he does have a android phone.

## 2018-06-27 NOTE — Telephone Encounter (Signed)
Called to speak with patient regarding his upcoming appt on 07/05/2018 with Dr Marlou Porch.  Need to verify if patient would prefer a virtual visit or phone visit since we are not seeing patient's in the office at this time.  Also need to change his appt time to 10:30 if possible.  Left message with fm to have pt to c/b to discuss.

## 2018-06-28 NOTE — Telephone Encounter (Signed)
Spoke with patient and obtained verbal consent to move forward with virtual appt (Doximity) Pt has a smart phone.  YOUR CARDIOLOGY TEAM HAS ARRANGED FOR AN E-VISIT FOR YOUR APPOINTMENT - PLEASE REVIEW IMPORTANT INFORMATION BELOW SEVERAL DAYS PRIOR TO YOUR APPOINTMENT  Due to the recent COVID-19 pandemic, we are transitioning in-person office visits to tele-medicine visits in an effort to decrease unnecessary exposure to our patients, their families, and staff. These visits are billed to your insurance just like a normal visit is. We also encourage you to sign up for MyChart if you have not already done so. You will need a smartphone if possible. For patients that do not have this, we can still complete the visit using a regular telephone but do prefer a smartphone to enable video when possible. You may have a family member that lives with you that can help. If possible, we also ask that you have a blood pressure cuff and scale at home to measure your blood pressure, heart rate and weight prior to your scheduled appointment. Patients with clinical needs that need an in-person evaluation and testing will still be able to come to the office if absolutely necessary. If you have any questions, feel free to call our office.  YOUR PROVIDER WILL BE USING THE FOLLOWING PLATFORM TO COMPLETE YOUR VISIT: Doximity   IF USING DOXIMITY - Dr Marlou Porch will text a link to your smart phone.  2-3 DAYS BEFORE YOUR APPOINTMENT  You will receive a telephone call from one of our [facility] team members - your caller ID may say "Unknown caller." If this is a video visit, we will walk you through how to get the video launched on your phone. We will remind you check your blood pressure, heart rate and weight prior to your scheduled appointment. If you have an Apple Watch or Kardia, please upload any pertinent ECG strips the day before or morning of your appointment to [facility]. Our staff will also make sure you have reviewed the  consent and agree to move forward with your scheduled tele-health visit.   THE DAY OF YOUR APPOINTMENT  Approximately 15 minutes prior to your scheduled appointment, you will receive a telephone call from one of [facility] team - your caller ID may say "Unknown caller."  Our staff will confirm medications, vital signs for the day and any symptoms you may be experiencing. Please have this information available prior to the time of visit start. It may also be helpful for you to have a pad of paper and pen handy for any instructions given during your visit. They will also walk you through joining the smartphone meeting if this is a video visit.    CONSENT FOR TELE-HEALTH VISIT - PLEASE REVIEW  I hereby voluntarily request, consent and authorize [facility] and its employed or contracted physicians, physician assistants, nurse practitioners or other licensed health care professionals (the Practitioner), to provide me with telemedicine health care services (the "Services") as deemed necessary by the treating Practitioner. I acknowledge and consent to receive the Services by the Practitioner via telemedicine. I understand that the telemedicine visit will involve communicating with the Practitioner through live audiovisual communication technology and the disclosure of certain medical information by electronic transmission. I acknowledge that I have been given the opportunity to request an in-person assessment or other available alternative prior to the telemedicine visit and am voluntarily participating in the telemedicine visit.  I understand that I have the right to withhold or withdraw my consent to the  use of telemedicine in the course of my care at any time, without affecting my right to future care or treatment, and that the Practitioner or I may terminate the telemedicine visit at any time. I understand that I have the right to inspect all information obtained and/or recorded in the course of the  telemedicine visit and may receive copies of available information for a reasonable fee.  I understand that some of the potential risks of receiving the Services via telemedicine include:  Marland Kitchen Delay or interruption in medical evaluation due to technological equipment failure or disruption; . Information transmitted may not be sufficient (e.g. poor resolution of images) to allow for appropriate medical decision making by the Practitioner; and/or  . In rare instances, security protocols could fail, causing a breach of personal health information.  Furthermore, I acknowledge that it is my responsibility to provide information about my medical history, conditions and care that is complete and accurate to the best of my ability. I acknowledge that Practitioner's advice, recommendations, and/or decision may be based on factors not within their control, such as incomplete or inaccurate data provided by me or distortions of diagnostic images or specimens that may result from electronic transmissions. I understand that the practice of medicine is not an exact science and that Practitioner makes no warranties or guarantees regarding treatment outcomes. I acknowledge that I will receive a copy of this consent concurrently upon execution via email to the email address I last provided but may also request a printed copy by calling the office of [facility].    I understand that my insurance will be billed for this visit.   I have read or had this consent read to me. . I understand the contents of this consent, which adequately explains the benefits and risks of the Services being provided via telemedicine.  . I have been provided ample opportunity to ask questions regarding this consent and the Services and have had my questions answered to my satisfaction. . I give my informed consent for the services to be provided through the use of telemedicine in my medical care  By participating in this telemedicine visit I  agree to the above. Verbal consent obtained

## 2018-07-05 ENCOUNTER — Telehealth (INDEPENDENT_AMBULATORY_CARE_PROVIDER_SITE_OTHER): Payer: Medicare Other | Admitting: Cardiology

## 2018-07-05 ENCOUNTER — Other Ambulatory Visit: Payer: Self-pay

## 2018-07-05 ENCOUNTER — Encounter: Payer: Self-pay | Admitting: Cardiology

## 2018-07-05 VITALS — BP 147/74 | HR 70 | Ht 64.0 in | Wt 185.0 lb

## 2018-07-05 DIAGNOSIS — E7800 Pure hypercholesterolemia, unspecified: Secondary | ICD-10-CM

## 2018-07-05 DIAGNOSIS — I2510 Atherosclerotic heart disease of native coronary artery without angina pectoris: Secondary | ICD-10-CM

## 2018-07-05 DIAGNOSIS — C679 Malignant neoplasm of bladder, unspecified: Secondary | ICD-10-CM

## 2018-07-05 DIAGNOSIS — I10 Essential (primary) hypertension: Secondary | ICD-10-CM

## 2018-07-05 NOTE — Progress Notes (Signed)
Virtual Visit via Video Note   This visit type was conducted due to national recommendations for restrictions regarding the COVID-19 Pandemic (e.g. social distancing) in an effort to limit this patient's exposure and mitigate transmission in our community.  Due to his co-morbid illnesses, this patient is at least at moderate risk for complications without adequate follow up.  This format is felt to be most appropriate for this patient at this time.  All issues noted in this document were discussed and addressed.  A limited physical exam was performed with this format.  Please refer to the patient's chart for his consent to telehealth for [facility].   Date:  07/05/2018   ID:  Cody Thomas, DOB [birth year], MRN [id number]  Patient Location: Home Provider Location: Home  PCP:  Juluis Rainier, MD  Cardiologist:  Donato Schultz, MD  Electrophysiologist:  None   Evaluation Performed:  Follow-Up Visit  Chief Complaint:  CAD follow up  History of Present Illness:    Cody Thomas is a 85 y.o. male with coronary artery disease status post CABG 2013 four-vessel with brief postoperative atrial fibrillation, hypertension, bladder cancer, statin intolerance here for follow-up.  Wear CPAP mask.  Had trouble with increasing dose of statin, nightmares.  Was able to tolerate Crestor 3 times a week.  Did not like the way atorvastatin made him feel.  No fevers. No CP. Left leg in the morning feels like crawling, wiggling. That goes away.  Has sensation in jaw like prior to first stent. ?gum.   The patient does not have symptoms concerning for COVID-19 infection (fever, chills, cough, or new shortness of breath).    Past Medical History:  Diagnosis Date  . Anxiety   . Bunion 09/29/2011  . Cancer (HCC)    nose/Dr. Albertini;skin  . Chronic constipation   . Chronic kidney disease    stage 111  . Coronary atherosclerosis of native coronary artery    2009 LAD CIRC DES  . Depression     doesn't take any meds for this  . Diabetes mellitus    takes Januvia daily  . Dizziness   . Enlarged prostate    but doesn't require meds at present  . GERD (gastroesophageal reflux disease)    TUMS prn  . H/O hiatal hernia   . Headache(784.0)   . History of gout    doesn't require meds  . Hx of cardiac cath   . Hyperlipidemia    Crestor 3 x wk and Zetia daily  . Hypertension    takes Hyzaar daily  . Insomnia    doesn't require meds at present time  . Joint pain   . Onychomycosis 10/05/2012   x 10  . PONV (postoperative nausea and vomiting)    pt states extremely sick  . Shortness of breath    with exertion   Past Surgical History:  Procedure Laterality Date  . bladder cancer    . CARDIAC CATHETERIZATION  2013  . cataracts removed    . CHOLECYSTECTOMY    . COLONOSCOPY    . CORONARY ANGIOPLASTY     2 stents from 2009  . CORONARY ARTERY BYPASS GRAFT  10/29/2011   Procedure: CORONARY ARTERY BYPASS GRAFTING (CABG);  Surgeon: Kerin Perna, MD;  Location[facility];  Service: Open Heart Surgery;  Laterality: N/A;  . cyst removed from finger     right hand  . EYE SURGERY     bilateral cataracts  . HEMORRHOID SURGERY    .  HERNIA REPAIR     x 2;inguinal  . KIDNEY STONE SURGERY    . LEFT HEART CATHETERIZATION WITH CORONARY ANGIOGRAM N/A 10/21/2011   Procedure: LEFT HEART CATHETERIZATION WITH CORONARY ANGIOGRAM;  Surgeon: Donato Schultz, MD;  Location: [facility] CATH LAB;  Service: Cardiovascular;  Laterality: N/A;  . rotator cuff surgery     right   . skin cancer removed       Current Meds  Medication Sig  . aspirin EC 81 MG tablet Take 1 tablet (81 mg total) by mouth daily.  Marland Kitchen b complex vitamins capsule Take 1 capsule by mouth daily.  . calcium carbonate (TUMS - DOSED IN MG ELEMENTAL CALCIUM) 500 MG chewable tablet Chew 2 tablets by mouth daily as needed. For indigestion.   . Coenzyme Q10-Vitamin E 100-1 MG-UNT/5ML SYRP Take 100 mg by mouth daily. Take 2 tablespoons (100MG )  Daily  . fish oil-omega-3 fatty acids 1000 MG capsule Take 1,200 mg by mouth daily. 4 1200 tablets daily  . fluticasone (FLONASE) 50 MCG/ACT nasal spray Place 1 spray into both nostrils daily as needed for allergies or rhinitis.  Marland Kitchen gabapentin (NEURONTIN) 100 MG capsule Take 2 capsules (200 mg total) by mouth at bedtime.  Marland Kitchen LORazepam (ATIVAN) 0.5 MG tablet TK 1/2 TO 1 T PO QD PRN  . losartan-hydrochlorothiazide (HYZAAR) 100-25 MG tablet Take 1 tablet by mouth daily. Please keep upcoming appt with Dr. Anne Fu for future refills. Thank you  . metFORMIN (GLUCOPHAGE-XR) 500 MG 24 hr tablet TAKE 1 TABLET BY MOUTH TWICE DAILY  . metoprolol tartrate (LOPRESSOR) 25 MG tablet Take 1 tablet (25 mg total) by mouth 2 (two) times daily. Please keep upcoming appt with Dr. Anne Fu for future refills. Thank you  . rosuvastatin (CRESTOR) 20 MG tablet Take 20 mg by mouth every other day.  . traMADol (ULTRAM) 50 MG tablet TAKE 1 TABLET BY MOUTH EVERY 6 HOURS AS NEEDED  . valACYclovir (VALTREX) 1000 MG tablet TK 1 T PO TID FOR 10 DAYS     Allergies:   Crestor [rosuvastatin]; Sertraline; and Zocor [simvastatin]   Social History   Tobacco Use  . Smoking status: Former Smoker    Packs/day: 1.50    Years: 30.00    Pack years: 45.00    Types: Cigarettes    Last attempt to quit: 03/02/1984    Years since quitting: 34.3  . Smokeless tobacco: Never Used  Substance Use Topics  . Alcohol use: No    Alcohol/week: 0.0 standard drinks  . Drug use: No     Family Hx: The patient's family history includes Cancer in his brother; Heart disease in his mother; Stroke in his father.  ROS:   Please see the history of present illness.    Denies any fevers chills nausea vomiting syncope bleeding All other systems reviewed and are negative.   Prior CV studies:   The following studies were reviewed today:  Prior EKG reviewed  Labs/Other Tests and Data Reviewed:    EKG:  An ECG dated 04/22/17 was personally reviewed  today and demonstrated:  Sinus rhythm with first-degree AV block 300 ms.  Recent Labs: 01/04/2018: TSH 2.15 05/05/2018: ALT 19; BUN 21; Creatinine, Ser 1.46; Potassium 4.1; Sodium 138   Recent Lipid Panel Lab Results  Component Value Date/Time   CHOL 109 05/05/2018 09:31 AM   TRIG 110.0 05/05/2018 09:31 AM   HDL 38.10 (L) 05/05/2018 09:31 AM   CHOLHDL 3 05/05/2018 09:31 AM   LDLCALC 49 05/05/2018 09:31 AM  Wt Readings from Last 3 Encounters:  07/05/18 185 lb (83.9 kg)  05/11/18 191 lb 9.6 oz (86.9 kg)  07/03/17 186 lb (84.4 kg)     Objective:    Vital Signs:  BP (!) 147/74   Pulse 70   Ht 5\' 4"  (1.626 m)   Wt 185 lb (83.9 kg)   BMI 31.76 kg/m    VITAL SIGNS:  reviewed GEN:  no acute distress EYES:  sclerae anicteric, EOMI - Extraocular Movements Intact RESPIRATORY:  normal respiratory effort, symmetric expansion SKIN:  no rash, lesions or ulcers. MUSCULOSKELETAL:  no obvious deformities. NEURO:  alert and oriented x 3, no obvious focal deficit PSYCH:  normal affect  ASSESSMENT & PLAN:    Coronary artery disease - Post CABG.  Doing well without any anginal symptoms.  Continue with aggressive secondary risk factor prevention.  He does state that he has occasional jaw discomfort but wonders if this is from chewing gum.  Certainly could be.  He had prior jaw discomfort prior to his original PCI.  We will continue to monitor.  Try our best to utilize noninvasive strategies.  Hyperlipidemia - Difficult with higher intensity statins at continual dosing.  He is taking Crestor 3 times a week and doing well.  No changes. - Last LDL 49, excellent on 05/05/2018  Bladder cancer - Dr. Earlene Plater, [facility]. Doing well. Once a year, cysto.   Essential hypertension with diabetes - Overall reasonable control.  Medications reviewed.  Dr. Remus Blake note reviewed.  Hemoglobin A1c 6.4 in 05/05/2018. Most of the time high in AM. Wonders if he should change to PM. Will try.   First-degree AV  block - About 300 ms.  Continue to monitor for any further deterioration. No syncope.   COVID-19 Education: The signs and symptoms of COVID-19 were discussed with the patient and how to seek care for testing (follow up with PCP or arrange E-visit).  The importance of social distancing was discussed today.  Time:   Today, I have spent 12 minutes with the patient with telehealth technology discussing the above problems.     Medication Adjustments/Labs and Tests Ordered: Current medicines are reviewed at length with the patient today.  Concerns regarding medicines are outlined above.   Tests Ordered: No orders of the defined types were placed in this encounter.   Medication Changes: No orders of the defined types were placed in this encounter.   Disposition:  Follow up in 6 month(s)  Signed, Donato Schultz, MD  07/05/2018 12:27 PM    [facility]

## 2018-07-05 NOTE — Patient Instructions (Signed)
Medication Instructions:  The current medical regimen is effective;  continue present plan and medications.  If you need a refill on your cardiac medications before your next appointment, please call your pharmacy.   Follow-Up: Follow up in 6 months with Truitt Merle and 1 year with Dr. Marlou Porch.  You will receive a letter in the mail 2 months before you are due.  Please call us when you receive this letter to schedule your follow up appointment.  Thank you for choosing [facility]!!

## 2018-08-15 ENCOUNTER — Ambulatory Visit (INDEPENDENT_AMBULATORY_CARE_PROVIDER_SITE_OTHER): Payer: Medicare Other | Admitting: Podiatry

## 2018-08-15 ENCOUNTER — Encounter: Payer: Self-pay | Admitting: Podiatry

## 2018-08-15 ENCOUNTER — Other Ambulatory Visit: Payer: Self-pay

## 2018-08-15 VITALS — Temp 97.6°F

## 2018-08-15 DIAGNOSIS — B351 Tinea unguium: Secondary | ICD-10-CM | POA: Diagnosis not present

## 2018-08-15 DIAGNOSIS — M79674 Pain in right toe(s): Secondary | ICD-10-CM

## 2018-08-15 DIAGNOSIS — M79675 Pain in left toe(s): Secondary | ICD-10-CM | POA: Diagnosis not present

## 2018-08-15 NOTE — Patient Instructions (Signed)
Diabetes Mellitus and Foot Care Foot care is an important part of your health, especially when you have diabetes. Diabetes may cause you to have problems because of poor blood flow (circulation) to your feet and legs, which can cause your skin to:  Become thinner and drier.  Break more easily.  Heal more slowly.  Peel and crack. You may also have nerve damage (neuropathy) in your legs and feet, causing decreased feeling in them. This means that you may not notice minor injuries to your feet that could lead to more serious problems. Noticing and addressing any potential problems early is the best way to prevent future foot problems. How to care for your feet Foot hygiene  Wash your feet daily with warm water and mild soap. Do not use hot water. Then, pat your feet and the areas between your toes until they are completely dry. Do not soak your feet as this can dry your skin.  Trim your toenails straight across. Do not dig under them or around the cuticle. File the edges of your nails with an emery board or nail file.  Apply a moisturizing lotion or petroleum jelly to the skin on your feet and to dry, brittle toenails. Use lotion that does not contain alcohol and is unscented. Do not apply lotion between your toes. Shoes and socks  Wear clean socks or stockings every day. Make sure they are not too tight. Do not wear knee-high stockings since they may decrease blood flow to your legs.  Wear shoes that fit properly and have enough cushioning. Always look in your shoes before you put them on to be sure there are no objects inside.  To break in new shoes, wear them for just a few hours a day. This prevents injuries on your feet. Wounds, scrapes, corns, and calluses  Check your feet daily for blisters, cuts, bruises, sores, and redness. If you cannot see the bottom of your feet, use a mirror or ask someone for help.  Do not cut corns or calluses or try to remove them with medicine.  If you  find a minor scrape, cut, or break in the skin on your feet, keep it and the skin around it clean and dry. You may clean these areas with mild soap and water. Do not clean the area with peroxide, alcohol, or iodine.  If you have a wound, scrape, corn, or callus on your foot, look at it several times a day to make sure it is healing and not infected. Check for: ? Redness, swelling, or pain. ? Fluid or blood. ? Warmth. ? Pus or a bad smell. General instructions  Do not cross your legs. This may decrease blood flow to your feet.  Do not use heating pads or hot water bottles on your feet. They may burn your skin. If you have lost feeling in your feet or legs, you may not know this is happening until it is too late.  Protect your feet from hot and cold by wearing shoes, such as at the beach or on hot pavement.  Schedule a complete foot exam at least once a year (annually) or more often if you have foot problems. If you have foot problems, report any cuts, sores, or bruises to your health care provider immediately. Contact a health care provider if:  You have a medical condition that increases your risk of infection and you have any cuts, sores, or bruises on your feet.  You have an injury that is not   healing.  You have redness on your legs or feet.  You feel burning or tingling in your legs or feet.  You have pain or cramps in your legs and feet.  Your legs or feet are numb.  Your feet always feel cold.  You have pain around a toenail. Get help right away if:  You have a wound, scrape, corn, or callus on your foot and: ? You have pain, swelling, or redness that gets worse. ? You have fluid or blood coming from the wound, scrape, corn, or callus. ? Your wound, scrape, corn, or callus feels warm to the touch. ? You have pus or a bad smell coming from the wound, scrape, corn, or callus. ? You have a fever. ? You have a red line going up your leg. Summary  Check your feet every day  for cuts, sores, red spots, swelling, and blisters.  Moisturize feet and legs daily.  Wear shoes that fit properly and have enough cushioning.  If you have foot problems, report any cuts, sores, or bruises to your health care provider immediately.  Schedule a complete foot exam at least once a year (annually) or more often if you have foot problems. This information is not intended to replace advice given to you by your health care provider. Make sure you discuss any questions you have with your health care provider. Document Released: 02/14/2000 Document Revised: 03/31/2017 Document Reviewed: 03/20/2016 Elsevier Interactive Patient Education  2019 [employer]  Onychomycosis/Fungal Toenails  WHAT IS IT? An infection that lies within the keratin of your nail plate that is caused by a fungus.  WHY ME? Fungal infections affect all ages, sexes, races, and creeds.  There may be many factors that predispose you to a fungal infection such as age, coexisting medical conditions such as diabetes, or an autoimmune disease; stress, medications, fatigue, genetics, etc.  Bottom line: fungus thrives in a warm, moist environment and your shoes offer such a location.  IS IT CONTAGIOUS? Theoretically, yes.  You do not want to share shoes, nail clippers or files with someone who has fungal toenails.  Walking around barefoot in the same room or sleeping in the same bed is unlikely to transfer the organism.  It is important to realize, however, that fungus can spread easily from one nail to the next on the same foot.  HOW DO WE TREAT THIS?  There are several ways to treat this condition.  Treatment may depend on many factors such as age, medications, pregnancy, liver and kidney conditions, etc.  It is best to ask your doctor which options are available to you.  1. No treatment.   Unlike many other medical concerns, you can live with this condition.  However for many people this can be a painful condition and  may lead to ingrown toenails or a bacterial infection.  It is recommended that you keep the nails cut short to help reduce the amount of fungal nail. 2. Topical treatment.  These range from herbal remedies to prescription strength nail lacquers.  About 40-50% effective, topicals require twice daily application for approximately 9 to 12 months or until an entirely new nail has grown out.  The most effective topicals are medical grade medications available through physicians offices. 3. Oral antifungal medications.  With an 80-90% cure rate, the most common oral medication requires 3 to 4 months of therapy and stays in your system for a year as the new nail grows out.  Oral antifungal medications do require   blood work to make sure it is a safe drug for you.  A liver function panel will be performed prior to starting the medication and after the first month of treatment.  It is important to have the blood work performed to avoid any harmful side effects.  In general, this medication safe but blood work is required. 4. Laser Therapy.  This treatment is performed by applying a specialized laser to the affected nail plate.  This therapy is noninvasive, fast, and non-painful.  It is not covered by insurance and is therefore, out of pocket.  The results have been very good with a 80-95% cure rate.  [facility] is the only practice in the area to offer this therapy. 5. Permanent Nail Avulsion.  Removing the entire nail so that a new nail will not grow back. 

## 2018-08-25 NOTE — Progress Notes (Signed)
Subjective: Cody Thomas presents to clinic today with cc of  painful, thick, discolored, elongated toenails 1-5 b/l that become tender and cannot cut because of thickness. Pain is aggravated when wearing enclosed shoe gear.  Leighton Ruff, MD    Current Outpatient Medications:  .  aspirin EC 81 MG tablet, Take 1 tablet (81 mg total) by mouth daily., Disp: , Rfl:  .  b complex vitamins capsule, Take 1 capsule by mouth daily., Disp: , Rfl:  .  calcium carbonate (TUMS - DOSED IN MG ELEMENTAL CALCIUM) 500 MG chewable tablet, Chew 2 tablets by mouth daily as needed. For indigestion. , Disp: , Rfl:  .  Coenzyme Q10-Vitamin E 100-1 MG-UNT/5ML SYRP, Take 100 mg by mouth daily. Take 2 tablespoons (100MG ) Daily, Disp: , Rfl:  .  fish oil-omega-3 fatty acids 1000 MG capsule, Take 1,200 mg by mouth daily. 4 1200 tablets daily, Disp: , Rfl:  .  fluticasone (FLONASE) 50 MCG/ACT nasal spray, Place 1 spray into both nostrils daily as needed for allergies or rhinitis., Disp: , Rfl:  .  gabapentin (NEURONTIN) 100 MG capsule, Take 2 capsules (200 mg total) by mouth at bedtime., Disp: 60 capsule, Rfl: 6 .  LORazepam (ATIVAN) 0.5 MG tablet, TK 1/2 TO 1 T PO QD PRN, Disp: , Rfl: 0 .  losartan-hydrochlorothiazide (HYZAAR) 100-25 MG tablet, Take 1 tablet by mouth daily. Please keep upcoming appt with Dr. Marlou Porch for future refills. Thank you, Disp: 90 tablet, Rfl: 0 .  metFORMIN (GLUCOPHAGE-XR) 500 MG 24 hr tablet, TAKE 1 TABLET BY MOUTH TWICE DAILY, Disp: 180 tablet, Rfl: 0 .  metoprolol tartrate (LOPRESSOR) 25 MG tablet, Take 1 tablet (25 mg total) by mouth 2 (two) times daily. Please keep upcoming appt with Dr. Marlou Porch for future refills. Thank you, Disp: 180 tablet, Rfl: 0 .  rosuvastatin (CRESTOR) 20 MG tablet, Take 20 mg by mouth every other day., Disp: , Rfl:  .  traMADol (ULTRAM) 50 MG tablet, TAKE 1 TABLET BY MOUTH EVERY 6 HOURS AS NEEDED, Disp: 30 tablet, Rfl: 0 .  valACYclovir (VALTREX) 1000 MG  tablet, TK 1 T PO TID FOR 10 DAYS, Disp: , Rfl: 1   Allergies  Allergen Reactions  . Crestor [Rosuvastatin]     Nightmares in high dosage  . Sertraline Diarrhea  . Zocor [Simvastatin]     Nightmares in high dosage     Objective: Vitals:   08/15/18 1325  Temp: 97.6 F (36.4 C)    Physical Examination:  Vascular Examination: Capillary refill time immediate x 10 digits.  Palpable DP/PT pulses b/l.  Digital hair absent b/l.  No edema noted b/l.  Skin temperature gradient WNL b/l.  Dermatological Examination: Skin noted to be thin and atrophic b/l.  No open wounds b/l.  No interdigital macerations noted b/l.  Elongated, thick, discolored brittle toenails with subungual debris and pain on dorsal palpation of nailbeds 1-5 b/l.  Musculoskeletal Examination: Muscle strength 5/5 to all muscle groups b/l  No pain, crepitus or joint discomfort with active/passive ROM.  Neurological Examination: Sensation intact 5/5 b/l with 10 gram monofilament.  Vibratory sensation intact b/l.  Proprioceptive sensation intact b/l.  Assessment: Mycotic nail infection with pain 1-5 b/l  Plan: 1. Toenails 1-5 b/l were debrided in length and girth without iatrogenic laceration. 2.  Continue soft, supportive shoe gear daily. 3.  Report any pedal injuries to medical professional. 4.  Follow up 3 months. 5.  Patient/POA to call should there be a question/concern in  there interim.

## 2018-09-02 ENCOUNTER — Other Ambulatory Visit: Payer: Self-pay | Admitting: Endocrinology

## 2018-09-02 ENCOUNTER — Other Ambulatory Visit: Payer: Self-pay | Admitting: Cardiology

## 2018-09-05 ENCOUNTER — Other Ambulatory Visit: Payer: Self-pay

## 2018-09-05 ENCOUNTER — Other Ambulatory Visit (INDEPENDENT_AMBULATORY_CARE_PROVIDER_SITE_OTHER): Payer: Medicare Other

## 2018-09-05 DIAGNOSIS — E041 Nontoxic single thyroid nodule: Secondary | ICD-10-CM | POA: Diagnosis not present

## 2018-09-05 DIAGNOSIS — E119 Type 2 diabetes mellitus without complications: Secondary | ICD-10-CM | POA: Diagnosis not present

## 2018-09-05 LAB — BASIC METABOLIC PANEL
BUN: 27 mg/dL — ABNORMAL HIGH (ref 6–23)
CO2: 28 mEq/L (ref 19–32)
Calcium: 9.3 mg/dL (ref 8.4–10.5)
Chloride: 101 mEq/L (ref 96–112)
Creatinine, Ser: 1.54 mg/dL — ABNORMAL HIGH (ref 0.40–1.50)
GFR: 43.05 mL/min — ABNORMAL LOW (ref >60.00)
Glucose, Bld: 127 mg/dL — ABNORMAL HIGH (ref 70–99)
Potassium: 4 mEq/L (ref 3.5–5.1)
Sodium: 138 mEq/L (ref 135–145)

## 2018-09-05 LAB — HEMOGLOBIN A1C: Hgb A1c MFr Bld: 6.6 % — ABNORMAL HIGH (ref 4.6–6.5)

## 2018-09-05 LAB — TSH: TSH: 2.51 u[IU]/mL (ref 0.35–4.50)

## 2018-09-08 ENCOUNTER — Other Ambulatory Visit: Payer: Self-pay

## 2018-09-08 ENCOUNTER — Encounter: Payer: Self-pay | Admitting: Endocrinology

## 2018-09-08 ENCOUNTER — Ambulatory Visit: Payer: Medicare Other | Admitting: Endocrinology

## 2018-09-08 VITALS — BP 160/80 | HR 63 | Ht 64.0 in | Wt 186.6 lb

## 2018-09-08 DIAGNOSIS — I10 Essential (primary) hypertension: Secondary | ICD-10-CM

## 2018-09-08 DIAGNOSIS — E119 Type 2 diabetes mellitus without complications: Secondary | ICD-10-CM

## 2018-09-08 NOTE — Progress Notes (Signed)
Patient ID: Cody Thomas, male   DOB: [birth year], 85 y.o.   MRN: [id number]   Reason for Appointment: Diabetes follow-up   History of Present Illness   Diagnosis: Type 2 DIABETES MELITUS  He has had mild diabetes which has been previously erratic partly because of dietary inconsistency and difficulty with weight loss He has not been continued on metformin because of renal dysfunction Was given low-dose Januvia instead but this was too expensive for him even though it was helping his control In 2014 he was given low dose Amaryl instead which appears to be keeping his blood sugars controlled  His blood sugars were not well controlled when he was seen in 1/16 with A1c 7.8  Subsequently Amaryl was stopped  Recent history:  Since 1/16 he has been on metformin ER and he is  taking 500 mg twice daily  A1c is slightly higher at 6.6, previously 6.4  Previous range 6.2-8 percent . Current blood sugar patterns, problems and management:  He has checked blood sugars infrequently  As before he periodically has high readings at times based on how he is watching his diet  Last evening after eating fast food dinner with chicken strips, biscuits and honey his blood sugar was over 200  However he has lost some weight since March  He has been more active doing yard work  His metformin dose has been limited by renal function  Most of his fasting blood sugars are excellent             Monitors blood glucose: Once a day or less .    Glucometer:   Accu-Chek         Blood Glucose readings and averages from download   PRE-MEAL Fasting Lunch Dinner Bedtime Overall  Glucose range:  109-124      Mean/median:  119     146   POST-MEAL PC Breakfast PC Lunch PC Dinner  Glucose range:    143-233  Mean/median:    187   Previous readings:  PRE-MEAL Fasting Lunch Dinner Bedtime Overall  Glucose range:  117-162    118, 195   Mean/median:  130    137  141   Dietitian consultation  last done several years ago  Breakfast meals: Sometimes Cheerios cereal, oatmeal, sometimes an egg, toast and apple butter Sometimes cookies for snacks   Wt Readings from Last 3 Encounters:  09/08/18 186 lb 9.6 oz (84.6 kg)  07/05/18 185 lb (83.9 kg)  05/11/18 191 lb 9.6 oz (86.9 kg)   DM LABS:  Lab Results  Component Value Date   HGBA1C 6.6 (H) 09/05/2018   HGBA1C 6.4 05/05/2018   HGBA1C 6.6 (H) 01/04/2018   Lab Results  Component Value Date   MICROALBUR <0.7 05/05/2018   LDLCALC 49 05/05/2018   CREATININE 1.54 (H) 09/05/2018   Lab on 09/05/2018  Component Date Value Ref Range Status  . TSH 09/05/2018 2.51  0.35 - 4.50 uIU/mL Final  . Sodium 09/05/2018 138  135 - 145 mEq/L Final  . Potassium 09/05/2018 4.0  3.5 - 5.1 mEq/L Final  . Chloride 09/05/2018 101  96 - 112 mEq/L Final  . CO2 09/05/2018 28  19 - 32 mEq/L Final  . Glucose, Bld 09/05/2018 127* 70 - 99 mg/dL Final  . BUN 09/05/2018 27* 6 - 23 mg/dL Final  . Creatinine, Ser 09/05/2018 1.54* 0.40 - 1.50 mg/dL Final  . Calcium 09/05/2018 9.3  8.4 - 10.5 mg/dL Final  . GFR  09/05/2018 43.05* >60.00 mL/min Final  . Hgb A1c MFr Bld 09/05/2018 6.6* 4.6 - 6.5 % Final   Glycemic Control Guidelines for People with Diabetes:Non Diabetic:  <6%Goal of Therapy: <7%Additional Action Suggested:  >8%      Allergies as of 09/08/2018      Reactions   Crestor [rosuvastatin]    Nightmares in high dosage   Sertraline Diarrhea   Zocor [simvastatin]    Nightmares in high dosage      Medication List       Accurate as of September 08, 2018 10:20 AM. If you have any questions, ask your nurse or doctor.        STOP taking these medications   gabapentin 100 MG capsule Commonly known as: NEURONTIN Stopped by: Elayne Snare, MD     TAKE these medications   aspirin EC 81 MG tablet Take 1 tablet (81 mg total) by mouth daily.   b complex vitamins capsule Take 1 capsule by mouth daily.   calcium carbonate 500 MG chewable tablet  Commonly known as: TUMS - dosed in mg elemental calcium Chew 2 tablets by mouth daily as needed. For indigestion.   Coenzyme Q10-Vitamin E 100-1 MG-UNT/5ML Syrp Take 100 mg by mouth daily. Take 2 tablespoons (100MG ) Daily   fish oil-omega-3 fatty acids 1000 MG capsule Take 1,200 mg by mouth daily. 4 1200 tablets daily   fluticasone 50 MCG/ACT nasal spray Commonly known as: FLONASE Place 1 spray into both nostrils daily as needed for allergies or rhinitis.   LORazepam 0.5 MG tablet Commonly known as: ATIVAN TK 1/2 TO 1 T PO QD PRN   losartan-hydrochlorothiazide 100-25 MG tablet Commonly known as: HYZAAR TAKE 1 TABLET BY MOUTH EVERY DAY   metFORMIN 500 MG 24 hr tablet Commonly known as: GLUCOPHAGE-XR TAKE 1 TABLET BY MOUTH TWICE DAILY   metoprolol tartrate 25 MG tablet Commonly known as: LOPRESSOR TAKE 1 TABLET BY MOUTH TWICE DAILY   rosuvastatin 20 MG tablet Commonly known as: CRESTOR Take 20 mg by mouth See admin instructions. Take 1 tablet by mouth three times weekly.   traMADol 50 MG tablet Commonly known as: ULTRAM TAKE 1 TABLET BY MOUTH EVERY 6 HOURS AS NEEDED   valACYclovir 1000 MG tablet Commonly known as: VALTREX TK 1 T PO TID FOR 10 DAYS       Allergies:  Allergies  Allergen Reactions  . Crestor [Rosuvastatin]     Nightmares in high dosage  . Sertraline Diarrhea  . Zocor [Simvastatin]     Nightmares in high dosage    Past Medical History:  Diagnosis Date  . Anxiety   . Bunion 09/29/2011  . Cancer (HCC)    nose/Dr. Albertini;skin  . Chronic constipation   . Chronic kidney disease    stage 111  . Coronary atherosclerosis of native coronary artery    2009 LAD CIRC DES  . Depression    doesn't take any meds for this  . Diabetes mellitus    takes Januvia daily  . Dizziness   . Enlarged prostate    but doesn't require meds at present  . GERD (gastroesophageal reflux disease)    TUMS prn  . H/O hiatal hernia   . Headache(784.0)   . History  of gout    doesn't require meds  . Hx of cardiac cath   . Hyperlipidemia    Crestor 3 x wk and Zetia daily  . Hypertension    takes Hyzaar daily  . Insomnia  doesn't require meds at present time  . Joint pain   . Onychomycosis 10/05/2012   x 10  . PONV (postoperative nausea and vomiting)    pt states extremely sick  . Shortness of breath    with exertion    Past Surgical History:  Procedure Laterality Date  . bladder cancer    . CARDIAC CATHETERIZATION  2013  . cataracts removed    . CHOLECYSTECTOMY    . COLONOSCOPY    . CORONARY ANGIOPLASTY     2 stents from 2009  . CORONARY ARTERY BYPASS GRAFT  10/29/2011   Procedure: CORONARY ARTERY BYPASS GRAFTING (CABG);  Surgeon: Ivin Poot, MD;  Location: [facility];  Service: Open Heart Surgery;  Laterality: N/A;  . cyst removed from finger     right hand  . EYE SURGERY     bilateral cataracts  . HEMORRHOID SURGERY    . HERNIA REPAIR     x 2;inguinal  . KIDNEY STONE SURGERY    . LEFT HEART CATHETERIZATION WITH CORONARY ANGIOGRAM N/A 10/21/2011   Procedure: LEFT HEART CATHETERIZATION WITH CORONARY ANGIOGRAM;  Surgeon: Candee Furbish, MD;  Location: [facility] CATH LAB;  Service: Cardiovascular;  Laterality: N/A;  . rotator cuff surgery     right   . skin cancer removed      Family History  Problem Relation Age of Onset  . Heart disease Mother   . Stroke Father   . Cancer Brother     Social History:  reports that he quit smoking about 34 years ago. His smoking use included cigarettes. He has a 45.00 pack-year smoking history. He has never used smokeless tobacco. He reports that he does not drink alcohol or use drugs.  Review of Systems:  HYPERTENSION:  followed by PCP and cardiologist and blood pressure is high today He thinks blood pressure is about 103/15 systolic at home  BP Readings from Last 3 Encounters:  09/08/18 (!) 160/80  07/05/18 (!) 147/74  05/11/18 (!) 142/82    RENAL dysfunction: Has mild persistent increase in  creatinine:  Lab Results  Component Value Date   CREATININE 1.54 (H) 09/05/2018   CREATININE 1.46 05/05/2018   CREATININE 1.44 01/04/2018     HYPERLIPIDEMIA: The lipid abnormality consists of elevated LDL and low HDL treated with Statin drugs   Currently taking Crestor 20 mg Levels are as below  Lipids followed by PCP and cardiologist    Lab Results  Component Value Date   CHOL 109 05/05/2018   HDL 38.10 (L) 05/05/2018   LDLCALC 49 05/05/2018   TRIG 110.0 05/05/2018   CHOLHDL 3 05/05/2018    Thyroid nodule: This has been stable and small with previous evaluations  Had a follow-up up  ultrasound in December 2017 done by his PCP, this was stable He also has a 12 mm probably pseudo-nodule present     Examination:   BP (!) 160/80 (BP Location: Left Arm, Patient Position: Sitting, Cuff Size: Normal)   Pulse 63   Ht 5\' 4"  (1.626 m)   Wt 186 lb 9.6 oz (84.6 kg)   SpO2 90%   BMI 32.03 kg/m   Body mass index is 32.03 kg/m.  Marland Kitchen Repeat blood pressure standing at 162/82 No pedal edema  ASSESSMENT/ PLAN:   Diabetes type 2 with  Obesity  See history of present illness for detailed discussion of current diabetes management, blood sugar patterns and problems identified  A1c is stable at 6.6  He has been on metformin  only, total 1000 mg daily for quite some time He is tolerating this well without any diarrhea Also dose is limited by renal function  Considering his age his blood sugar control is adequate despite having periodic readings over 200 after eating fast food or otherwise going off diet  Recently with being active he has lost weight compared to March  HYPERTENSION: Blood pressure is consistently high in the office but he thinks it is normal at home He will continue to follow-up with cardiologist and report any high readings  Renal function: About the same No microalbuminuria  Follow-up in 4 months     There are no Patient Instructions on file for this  visit.    Elayne Snare 09/08/2018, 10:20 AM

## 2018-10-06 ENCOUNTER — Encounter: Payer: Self-pay | Admitting: Cardiology

## 2018-10-06 DIAGNOSIS — E1122 Type 2 diabetes mellitus with diabetic chronic kidney disease: Secondary | ICD-10-CM | POA: Diagnosis not present

## 2018-10-06 DIAGNOSIS — E7800 Pure hypercholesterolemia, unspecified: Secondary | ICD-10-CM | POA: Diagnosis not present

## 2018-10-06 DIAGNOSIS — N183 Chronic kidney disease, stage 3 (moderate): Secondary | ICD-10-CM | POA: Diagnosis not present

## 2018-10-06 DIAGNOSIS — I10 Essential (primary) hypertension: Secondary | ICD-10-CM | POA: Diagnosis not present

## 2018-10-06 LAB — HEPATIC FUNCTION PANEL
ALT: 19 (ref 10–40)
AST: 23 (ref 14–40)

## 2018-10-06 LAB — LIPID PANEL
Cholesterol: 116 (ref 0–200)
HDL: 34 — AB (ref 35–70)
LDL Cholesterol: 51
Triglycerides: 158 (ref 40–160)

## 2018-10-06 LAB — BASIC METABOLIC PANEL
BUN: 20 (ref 4–21)
Creatinine: 1.6 — AB (ref <=1.3)
Glucose: 115
Potassium: 4.1 (ref 3.4–5.3)
Sodium: 140 (ref 137–147)

## 2018-10-06 LAB — HEMOGLOBIN A1C: Hemoglobin A1C: 6.3

## 2018-10-11 DIAGNOSIS — F419 Anxiety disorder, unspecified: Secondary | ICD-10-CM | POA: Diagnosis not present

## 2018-10-11 DIAGNOSIS — I10 Essential (primary) hypertension: Secondary | ICD-10-CM | POA: Diagnosis not present

## 2018-10-11 DIAGNOSIS — E7800 Pure hypercholesterolemia, unspecified: Secondary | ICD-10-CM | POA: Diagnosis not present

## 2018-10-11 DIAGNOSIS — F339 Major depressive disorder, recurrent, unspecified: Secondary | ICD-10-CM | POA: Diagnosis not present

## 2018-10-21 DIAGNOSIS — E878 Other disorders of electrolyte and fluid balance, not elsewhere classified: Secondary | ICD-10-CM | POA: Diagnosis not present

## 2018-11-15 DIAGNOSIS — Z23 Encounter for immunization: Secondary | ICD-10-CM | POA: Diagnosis not present

## 2018-11-16 ENCOUNTER — Ambulatory Visit (INDEPENDENT_AMBULATORY_CARE_PROVIDER_SITE_OTHER): Payer: Medicare Other | Admitting: Podiatry

## 2018-11-16 ENCOUNTER — Other Ambulatory Visit: Payer: Self-pay

## 2018-11-16 DIAGNOSIS — B351 Tinea unguium: Secondary | ICD-10-CM

## 2018-11-16 DIAGNOSIS — M79675 Pain in left toe(s): Secondary | ICD-10-CM

## 2018-11-16 DIAGNOSIS — M79674 Pain in right toe(s): Secondary | ICD-10-CM | POA: Diagnosis not present

## 2018-11-16 NOTE — Patient Instructions (Addendum)
APPLY NEOSPORSIN TO BOTH GREAT TOES DAILY FOR ONE WEEK  EPSOM SALT FOOT SOAK INSTRUCTIONS  1.  Place 1/4 cup of epsom salts in 2 quarts of warm tap water. IF YOU ARE DIABETIC, OR HAVE NEUROPATHY,  CHECK THE TEMPERATURE OF THE WATER WITH YOUR ELBOW.  2.  Submerge your foot/feet in the solution and soak for 20 minutes.      3.  Next, remove your foot or feet from solution, blot dry the affected area.    4.  Apply antibiotic ointment and cover with fabric band-aid .  5.  This soak should be done once a day for 3 days.   6.  Monitor for any signs/symptoms of infection such as redness, swelling, odor, drainage, increased pain,  or non-healing of digit.   7.  Please do not hesitate to call the office and speak to a Nurse or Doctor if you have questions.   8.  If you experience fever, chills, nightsweats, nausea or vomiting with worsening of digit, please go to the emergency room.   Diabetes Mellitus and Foot Care Foot care is an important part of your health, especially when you have diabetes. Diabetes may cause you to have problems because of poor blood flow (circulation) to your feet and legs, which can cause your skin to:  Become thinner and drier.  Break more easily.  Heal more slowly.  Peel and crack. You may also have nerve damage (neuropathy) in your legs and feet, causing decreased feeling in them. This means that you may not notice minor injuries to your feet that could lead to more serious problems. Noticing and addressing any potential problems early is the best way to prevent future foot problems. How to care for your feet Foot hygiene  Wash your feet daily with warm water and mild soap. Do not use hot water. Then, pat your feet and the areas between your toes until they are completely dry. Do not soak your feet as this can dry your skin.  Trim your toenails straight across. Do not dig under them or around the cuticle. File the edges of your nails with an emery board or nail  file.  Apply a moisturizing lotion or petroleum jelly to the skin on your feet and to dry, brittle toenails. Use lotion that does not contain alcohol and is unscented. Do not apply lotion between your toes. Shoes and socks  Wear clean socks or stockings every day. Make sure they are not too tight. Do not wear knee-high stockings since they may decrease blood flow to your legs.  Wear shoes that fit properly and have enough cushioning. Always look in your shoes before you put them on to be sure there are no objects inside.  To break in new shoes, wear them for just a few hours a day. This prevents injuries on your feet. Wounds, scrapes, corns, and calluses  Check your feet daily for blisters, cuts, bruises, sores, and redness. If you cannot see the bottom of your feet, use a mirror or ask someone for help.  Do not cut corns or calluses or try to remove them with medicine.  If you find a minor scrape, cut, or break in the skin on your feet, keep it and the skin around it clean and dry. You may clean these areas with mild soap and water. Do not clean the area with peroxide, alcohol, or iodine.  If you have a wound, scrape, corn, or callus on your foot, look at it  several times a day to make sure it is healing and not infected. Check for: ? Redness, swelling, or pain. ? Fluid or blood. ? Warmth. ? Pus or a bad smell. General instructions  Do not cross your legs. This may decrease blood flow to your feet.  Do not use heating pads or hot water bottles on your feet. They may burn your skin. If you have lost feeling in your feet or legs, you may not know this is happening until it is too late.  Protect your feet from hot and cold by wearing shoes, such as at the beach or on hot pavement.  Schedule a complete foot exam at least once a year (annually) or more often if you have foot problems. If you have foot problems, report any cuts, sores, or bruises to your health care provider immediately.  Contact a health care provider if:  You have a medical condition that increases your risk of infection and you have any cuts, sores, or bruises on your feet.  You have an injury that is not healing.  You have redness on your legs or feet.  You feel burning or tingling in your legs or feet.  You have pain or cramps in your legs and feet.  Your legs or feet are numb.  Your feet always feel cold.  You have pain around a toenail. Get help right away if:  You have a wound, scrape, corn, or callus on your foot and: ? You have pain, swelling, or redness that gets worse. ? You have fluid or blood coming from the wound, scrape, corn, or callus. ? Your wound, scrape, corn, or callus feels warm to the touch. ? You have pus or a bad smell coming from the wound, scrape, corn, or callus. ? You have a fever. ? You have a red line going up your leg. Summary  Check your feet every day for cuts, sores, red spots, swelling, and blisters.  Moisturize feet and legs daily.  Wear shoes that fit properly and have enough cushioning.  If you have foot problems, report any cuts, sores, or bruises to your health care provider immediately.  Schedule a complete foot exam at least once a year (annually) or more often if you have foot problems. This information is not intended to replace advice given to you by your health care provider. Make sure you discuss any questions you have with your health care provider. Document Released: 02/14/2000 Document Revised: 03/31/2017 Document Reviewed: 03/20/2016 Elsevier Patient Education  [place]? An infection that lies within the keratin of your nail plate that is caused by a fungus.  WHY ME? Fungal infections affect all ages, sexes, races, and creeds.  There may be many factors that predispose you to a fungal infection such as age, coexisting medical conditions such as diabetes, or an autoimmune disease;  stress, medications, fatigue, genetics, etc.  Bottom line: fungus thrives in a warm, moist environment and your shoes offer such a location.  IS IT CONTAGIOUS? Theoretically, yes.  You do not want to share shoes, nail clippers or files with someone who has fungal toenails.  Walking around barefoot in the same room or sleeping in the same bed is unlikely to transfer the organism.  It is important to realize, however, that fungus can spread easily from one nail to the next on the same foot.  HOW DO WE TREAT THIS?  There are several ways to treat this  condition.  Treatment may depend on many factors such as age, medications, pregnancy, liver and kidney conditions, etc.  It is best to ask your doctor which options are available to you.  1. No treatment.   Unlike many other medical concerns, you can live with this condition.  However for many people this can be a painful condition and may lead to ingrown toenails or a bacterial infection.  It is recommended that you keep the nails cut short to help reduce the amount of fungal nail. 2. Topical treatment.  These range from herbal remedies to prescription strength nail lacquers.  About 40-50% effective, topicals require twice daily application for approximately 9 to 12 months or until an entirely new nail has grown out.  The most effective topicals are medical grade medications available through physicians offices. 3. Oral antifungal medications.  With an 80-90% cure rate, the most common oral medication requires 3 to 4 months of therapy and stays in your system for a year as the new nail grows out.  Oral antifungal medications do require blood work to make sure it is a safe drug for you.  A liver function panel will be performed prior to starting the medication and after the first month of treatment.  It is important to have the blood work performed to avoid any harmful side effects.  In general, this medication safe but blood work is required. 4. Laser Therapy.   This treatment is performed by applying a specialized laser to the affected nail plate.  This therapy is noninvasive, fast, and non-painful.  It is not covered by insurance and is therefore, out of pocket.  The results have been very good with a 80-95% cure rate.  [facility] is the only practice in the area to offer this therapy. 5. Permanent Nail Avulsion.  Removing the entire nail so that a new nail will not grow back.

## 2018-11-22 ENCOUNTER — Encounter: Payer: Self-pay | Admitting: Podiatry

## 2018-11-22 NOTE — Progress Notes (Signed)
Subjective: Cody Thomas is seen today for follow up painful, elongated, thickened toenails 1-5 b/l feet that he cannot cut. Pain interferes with daily activities. Aggravating factor includes wearing enclosed shoe gear and relieved with periodic debridement.  Patient states about 3 weeks ago the right great toe bled at the proximal nailfold. He denies any trauma to the area. He denies any redness, swelling or drainage. He did not do anything to treat it.  He also denies any pain.  Current Outpatient Medications on File Prior to Visit  Medication Sig  . aspirin EC 81 MG tablet Take 1 tablet (81 mg total) by mouth daily.  Marland Kitchen b complex vitamins capsule Take 1 capsule by mouth daily.  . calcium carbonate (TUMS - DOSED IN MG ELEMENTAL CALCIUM) 500 MG chewable tablet Chew 2 tablets by mouth daily as needed. For indigestion.   . Coenzyme Q10-Vitamin E 100-1 MG-UNT/5ML SYRP Take 100 mg by mouth daily. Take 2 tablespoons (100MG ) Daily  . fish oil-omega-3 fatty acids 1000 MG capsule Take 1,200 mg by mouth daily. 4 1200 tablets daily  . fluticasone (FLONASE) 50 MCG/ACT nasal spray Place 1 spray into both nostrils daily as needed for allergies or rhinitis.  Marland Kitchen LORazepam (ATIVAN) 0.5 MG tablet TK 1/2 TO 1 T PO QD PRN  . losartan-hydrochlorothiazide (HYZAAR) 100-25 MG tablet TAKE 1 TABLET BY MOUTH EVERY DAY  . metFORMIN (GLUCOPHAGE-XR) 500 MG 24 hr tablet TAKE 1 TABLET BY MOUTH TWICE DAILY  . metoprolol tartrate (LOPRESSOR) 25 MG tablet TAKE 1 TABLET BY MOUTH TWICE DAILY  . rosuvastatin (CRESTOR) 20 MG tablet Take 20 mg by mouth See admin instructions. Take 1 tablet by mouth three times weekly.  . traMADol (ULTRAM) 50 MG tablet TAKE 1 TABLET BY MOUTH EVERY 6 HOURS AS NEEDED  . valACYclovir (VALTREX) 1000 MG tablet TK 1 T PO TID FOR 10 DAYS   No current facility-administered medications on file prior to visit.      Allergies  Allergen Reactions  . Crestor [Rosuvastatin]     Nightmares in high dosage   . Sertraline Diarrhea  . Zocor [Simvastatin]     Nightmares in high dosage    Objective:  Vascular Examination: Capillary refill time immediate x 10 digits.  Dorsalis pedis present b/l.  Posterior tibial pulses present b/l.  Digital hair absent x 10 digits.  No edema noted b/l.  Skin temperature gradient WNL b/l.   Dermatological Examination: Skin noted to be thin and atrophic b/l.  Toenails 1-5 b/l discolored, thick, dystrophic with subungual debris and pain with palpation to nailbeds due to thickness of nails. Dried heme proximal nail border right hallux. No erythema, no edema, no drainage.  Musculoskeletal: Muscle strength 5/5 b/l to all LE muscle groups.  No gross bony deformities b/l.  No pain, crepitus or joint limitation noted with ROM.   Neurological Examination: Protective sensation intact 5/5 b/l with 10 gram monofilament bilaterally.  Epicritic sensation present bilaterally.  Vibratory sensation intact bilaterally.   Assessment: Painful onychomycosis toenails 1-5 b/l   Plan: 1. Toenails 1-5 b/l were debrided in length and girth without iatrogenic bleeding. 2. Patient to continue soft, supportive shoe gear. 3. Patient to report any pedal injuries to medical professional immediately. 4. Follow up 3 months.  5. Patient/POA to call should there be a concern in the interim.

## 2018-12-06 ENCOUNTER — Telehealth: Payer: Self-pay

## 2018-12-06 NOTE — Telephone Encounter (Signed)
Called pt to set up possible OV with MS 12/07/2018, left message asking pt to call the office.

## 2018-12-19 DIAGNOSIS — H524 Presbyopia: Secondary | ICD-10-CM | POA: Diagnosis not present

## 2018-12-19 DIAGNOSIS — H3561 Retinal hemorrhage, right eye: Secondary | ICD-10-CM | POA: Diagnosis not present

## 2018-12-19 DIAGNOSIS — Z961 Presence of intraocular lens: Secondary | ICD-10-CM | POA: Diagnosis not present

## 2018-12-19 DIAGNOSIS — H35039 Hypertensive retinopathy, unspecified eye: Secondary | ICD-10-CM | POA: Diagnosis not present

## 2018-12-19 DIAGNOSIS — E11319 Type 2 diabetes mellitus with unspecified diabetic retinopathy without macular edema: Secondary | ICD-10-CM | POA: Diagnosis not present

## 2019-01-10 ENCOUNTER — Other Ambulatory Visit (INDEPENDENT_AMBULATORY_CARE_PROVIDER_SITE_OTHER): Payer: Medicare Other

## 2019-01-10 ENCOUNTER — Other Ambulatory Visit: Payer: Self-pay

## 2019-01-10 DIAGNOSIS — E119 Type 2 diabetes mellitus without complications: Secondary | ICD-10-CM

## 2019-01-10 LAB — COMPREHENSIVE METABOLIC PANEL
ALT: 17 U/L (ref 0–53)
AST: 24 U/L (ref 0–37)
Albumin: 4.2 g/dL (ref 3.5–5.2)
Alkaline Phosphatase: 47 U/L (ref 39–117)
BUN: 19 mg/dL (ref 6–23)
CO2: 30 mEq/L (ref 19–32)
Calcium: 9.5 mg/dL (ref 8.4–10.5)
Chloride: 102 mEq/L (ref 96–112)
Creatinine, Ser: 1.37 mg/dL (ref 0.40–1.50)
GFR: 49.24 mL/min — ABNORMAL LOW (ref >60.00)
Glucose, Bld: 119 mg/dL — ABNORMAL HIGH (ref 70–99)
Potassium: 3.8 mEq/L (ref 3.5–5.1)
Sodium: 138 mEq/L (ref 135–145)
Total Bilirubin: 1.2 mg/dL (ref 0.2–1.2)
Total Protein: 6.8 g/dL (ref 6.0–8.3)

## 2019-01-10 LAB — HEMOGLOBIN A1C: Hgb A1c MFr Bld: 6.4 % (ref 4.6–6.5)

## 2019-01-12 ENCOUNTER — Encounter: Payer: Self-pay | Admitting: Endocrinology

## 2019-01-12 ENCOUNTER — Other Ambulatory Visit: Payer: Self-pay

## 2019-01-12 ENCOUNTER — Ambulatory Visit (INDEPENDENT_AMBULATORY_CARE_PROVIDER_SITE_OTHER): Payer: Medicare Other | Admitting: Endocrinology

## 2019-01-12 VITALS — BP 140/80 | HR 63 | Ht 64.0 in | Wt 182.2 lb

## 2019-01-12 DIAGNOSIS — E1169 Type 2 diabetes mellitus with other specified complication: Secondary | ICD-10-CM | POA: Diagnosis not present

## 2019-01-12 DIAGNOSIS — I10 Essential (primary) hypertension: Secondary | ICD-10-CM

## 2019-01-12 DIAGNOSIS — E669 Obesity, unspecified: Secondary | ICD-10-CM

## 2019-01-12 NOTE — Progress Notes (Signed)
Patient ID: Cody Thomas, male   DOB: 09-15-1930, 85 y.o.   MRN: [id number]   Reason for Appointment: Diabetes follow-up   History of Present Illness   Diagnosis: Type 2 DIABETES MELITUS  He has had mild diabetes which has been previously erratic partly because of dietary inconsistency and difficulty with weight loss He has not been continued on metformin because of renal dysfunction Was given low-dose Januvia instead but this was too expensive for him even though it was helping his control In 2014 he was given low dose Amaryl instead which appears to be keeping his blood sugars controlled  His blood sugars were not well controlled when he was seen in 1/16 with A1c 7.8  Subsequently Amaryl was stopped partly because of mild hypoglycemia  Recent history:  Since 1/16 he has been on metformin ER and he is  taking 500 mg twice daily  A1c is stable at 6.4  Previous range 6.2-8 . Current blood sugar patterns, problems and management:  He has apparently been cutting back on his portions a little since his last visit  Weight is down 4 pounds  He is generally trying to be active with various kinds of yard work and some walking  As before he may have sporadic high readings after meals but not over 200 recently  No nausea with Metformin  His blood sugar in the lab was 119 fasting  Checking blood sugars about every other day on an average             Monitors blood glucose: Once a day or less .    Glucometer:   Accu-Chek         Blood Glucose readings and averages from download   PRE-MEAL Fasting  midday Dinner Bedtime Overall  Glucose range:  101-120  146, 191  149  111-175   Mean/median:     142 133   Previous readings:  PRE-MEAL Fasting Lunch Dinner Bedtime Overall  Glucose range:  109-124      Mean/median:  119     146   POST-MEAL PC Breakfast PC Lunch PC Dinner  Glucose range:    143-233  Mean/median:    187    Dietitian consultation last done  several years ago  Breakfast meals: Sometimes Cheerios cereal, oatmeal, sometimes an egg, toast and apple butter May have cookies for snacks   Wt Readings from Last 3 Encounters:  01/12/19 182 lb 3.2 oz (82.6 kg)  09/08/18 186 lb 9.6 oz (84.6 kg)  07/05/18 185 lb (83.9 kg)   DM LABS:  Lab Results  Component Value Date   HGBA1C 6.4 01/10/2019   HGBA1C 6.3 10/06/2018   HGBA1C 6.6 (H) 09/05/2018   Lab Results  Component Value Date   MICROALBUR <0.7 05/05/2018   LDLCALC 51 10/06/2018   CREATININE 1.37 01/10/2019   Lab on 01/10/2019  Component Date Value Ref Range Status  . Sodium 01/10/2019 138  135 - 145 mEq/L Final  . Potassium 01/10/2019 3.8  3.5 - 5.1 mEq/L Final  . Chloride 01/10/2019 102  96 - 112 mEq/L Final  . CO2 01/10/2019 30  19 - 32 mEq/L Final  . Glucose, Bld 01/10/2019 119* 70 - 99 mg/dL Final  . BUN 01/10/2019 19  6 - 23 mg/dL Final  . Creatinine, Ser 01/10/2019 1.37  0.40 - 1.50 mg/dL Final  . Total Bilirubin 01/10/2019 1.2  0.2 - 1.2 mg/dL Final  . Alkaline Phosphatase 01/10/2019 47  39 - 117  U/L Final  . AST 01/10/2019 24  0 - 37 U/L Final  . ALT 01/10/2019 17  0 - 53 U/L Final  . Total Protein 01/10/2019 6.8  6.0 - 8.3 g/dL Final  . Albumin 01/10/2019 4.2  3.5 - 5.2 g/dL Final  . GFR 01/10/2019 49.24* >60.00 mL/min Final  . Calcium 01/10/2019 9.5  8.4 - 10.5 mg/dL Final  . Hgb A1c MFr Bld 01/10/2019 6.4  4.6 - 6.5 % Final   Glycemic Control Guidelines for People with Diabetes:Non Diabetic:  <6%Goal of Therapy: <7%Additional Action Suggested:  >8%      Allergies as of 01/12/2019      Reactions   Crestor [rosuvastatin]    Nightmares in high dosage   Sertraline Diarrhea   Zocor [simvastatin]    Nightmares in high dosage      Medication List       Accurate as of January 12, 2019 10:10 AM. If you have any questions, ask your nurse or doctor.        aspirin EC 81 MG tablet Take 1 tablet (81 mg total) by mouth daily.   b complex vitamins  capsule Take 1 capsule by mouth daily.   calcium carbonate 500 MG chewable tablet Commonly known as: TUMS - dosed in mg elemental calcium Chew 2 tablets by mouth daily as needed. For indigestion.   Coenzyme Q10-Vitamin E 100-1 MG-UNT/5ML Syrp Take 100 mg by mouth daily. Take 2 tablespoons (100MG ) Daily   fish oil-omega-3 fatty acids 1000 MG capsule Take 1,200 mg by mouth daily. 4 1200 tablets daily   fluticasone 50 MCG/ACT nasal spray Commonly known as: FLONASE Place 1 spray into both nostrils daily as needed for allergies or rhinitis.   LORazepam 0.5 MG tablet Commonly known as: ATIVAN TK 1/2 TO 1 T PO QD PRN   losartan-hydrochlorothiazide 100-25 MG tablet Commonly known as: HYZAAR TAKE 1 TABLET BY MOUTH EVERY DAY   metFORMIN 500 MG 24 hr tablet Commonly known as: GLUCOPHAGE-XR TAKE 1 TABLET BY MOUTH TWICE DAILY   metoprolol tartrate 25 MG tablet Commonly known as: LOPRESSOR TAKE 1 TABLET BY MOUTH TWICE DAILY   rosuvastatin 20 MG tablet Commonly known as: CRESTOR Take 20 mg by mouth See admin instructions. Take 1 tablet by mouth three times weekly.   traMADol 50 MG tablet Commonly known as: ULTRAM TAKE 1 TABLET BY MOUTH EVERY 6 HOURS AS NEEDED   valACYclovir 1000 MG tablet Commonly known as: VALTREX TK 1 T PO TID FOR 10 DAYS       Allergies:  Allergies  Allergen Reactions  . Crestor [Rosuvastatin]     Nightmares in high dosage  . Sertraline Diarrhea  . Zocor [Simvastatin]     Nightmares in high dosage    Past Medical History:  Diagnosis Date  . Anxiety   . Bunion 09/29/2011  . Cancer (HCC)    nose/Dr. Albertini;skin  . Chronic constipation   . Chronic kidney disease    stage 111  . Coronary atherosclerosis of native coronary artery    2009 LAD CIRC DES  . Depression    doesn't take any meds for this  . Diabetes mellitus    takes Januvia daily  . Dizziness   . Enlarged prostate    but doesn't require meds at present  . GERD (gastroesophageal  reflux disease)    TUMS prn  . H/O hiatal hernia   . Headache(784.0)   . History of gout    doesn't require meds  .  Hx of cardiac cath   . Hyperlipidemia    Crestor 3 x wk and Zetia daily  . Hypertension    takes Hyzaar daily  . Insomnia    doesn't require meds at present time  . Joint pain   . Onychomycosis 10/05/2012   x 10  . PONV (postoperative nausea and vomiting)    pt states extremely sick  . Shortness of breath    with exertion    Past Surgical History:  Procedure Laterality Date  . bladder cancer    . CARDIAC CATHETERIZATION  2013  . cataracts removed    . CHOLECYSTECTOMY    . COLONOSCOPY    . CORONARY ANGIOPLASTY     2 stents from 2009  . CORONARY ARTERY BYPASS GRAFT  10/29/2011   Procedure: CORONARY ARTERY BYPASS GRAFTING (CABG);  Surgeon: Ivin Poot, MD;  Location: [facility];  Service: Open Heart Surgery;  Laterality: N/A;  . cyst removed from finger     right hand  . EYE SURGERY     bilateral cataracts  . HEMORRHOID SURGERY    . HERNIA REPAIR     x 2;inguinal  . KIDNEY STONE SURGERY    . LEFT HEART CATHETERIZATION WITH CORONARY ANGIOGRAM N/A 10/21/2011   Procedure: LEFT HEART CATHETERIZATION WITH CORONARY ANGIOGRAM;  Surgeon: Candee Furbish, MD;  Location: [facility] CATH LAB;  Service: Cardiovascular;  Laterality: N/A;  . rotator cuff surgery     right   . skin cancer removed      Family History  Problem Relation Age of Onset  . Heart disease Mother   . Stroke Father   . Cancer Brother     Social History:  reports that he quit smoking about 34 years ago. His smoking use included cigarettes. He has a 45.00 pack-year smoking history. He has never used smokeless tobacco. He reports that he does not drink alcohol or use drugs.  Review of Systems:  HYPERTENSION:  followed by PCP and cardiologist and blood pressure is relatively better today He checks his blood pressure at home regularly and is ranging from [id number] systolic  BP Readings from Last 3  Encounters:  01/12/19 140/80  09/08/18 (!) 160/80  07/05/18 (!) 147/74    RENAL dysfunction: Has mild persistent increase in creatinine:  Lab Results  Component Value Date   CREATININE 1.37 01/10/2019   CREATININE 1.6 (A) 10/06/2018   CREATININE 1.54 (H) 09/05/2018     HYPERLIPIDEMIA: The lipid abnormality consists of elevated LDL and low HDL treated with Statin drugs   Currently taking Crestor 20 mg Levels are as below  Lipids followed by PCP and cardiologist    Lab Results  Component Value Date   CHOL 116 10/06/2018   HDL 34 (A) 10/06/2018   LDLCALC 51 10/06/2018   TRIG 158 10/06/2018   CHOLHDL 3 05/05/2018    Thyroid nodule: This has been stable and small with previous evaluations  Had a follow-up up  ultrasound in December 2017 done by his PCP, this was stable He also has a 12 mm probably pseudo-nodule present     Examination:   BP 140/80 (BP Location: Left Arm, Patient Position: Sitting, Cuff Size: Normal)   Pulse 63   Ht 5\' 4"  (1.626 m)   Wt 182 lb 3.2 oz (82.6 kg)   SpO2 93%   BMI 31.27 kg/m   Body mass index is 31.27 kg/m.  .   ASSESSMENT/ PLAN:   Diabetes type 2 with mild obesity  See history of present illness for detailed discussion of current diabetes management, blood sugar patterns and problems identified  A1c is stable at 6.4, previously 6.6  He has been on metformin monotherapy, taking 1000 mg daily for quite some time He is tolerating this well without any GI side effects As before dose is limited by renal function  He has fairly good blood sugars at home although checking infrequently and rarely blood sugars are above 180 postprandial He is doing very well with his activity level considering his age and recently cutting back on portions to lose a little weight  He will continue the same medication Discussed timing of when to check his blood sugar and targets   HYPERTENSION: Blood pressure is relatively better and he will  continue to monitor at home and report to his cardiologist or PCP   Renal function: Slightly better now Dysfunction is not related to nephropathy from diabetes  Follow-up in 4 months     There are no Patient Instructions on file for this visit.    Elayne Snare 01/12/2019, 10:10 AM

## 2019-01-17 ENCOUNTER — Encounter: Payer: Self-pay | Admitting: Internal Medicine

## 2019-01-17 ENCOUNTER — Ambulatory Visit (INDEPENDENT_AMBULATORY_CARE_PROVIDER_SITE_OTHER): Payer: Medicare Other | Admitting: Internal Medicine

## 2019-01-17 ENCOUNTER — Other Ambulatory Visit: Payer: Self-pay

## 2019-01-17 VITALS — BP 142/80 | HR 69 | Temp 98.3°F | Ht 64.0 in | Wt 184.6 lb

## 2019-01-17 DIAGNOSIS — R918 Other nonspecific abnormal finding of lung field: Secondary | ICD-10-CM

## 2019-01-17 DIAGNOSIS — G4733 Obstructive sleep apnea (adult) (pediatric): Secondary | ICD-10-CM | POA: Diagnosis not present

## 2019-01-17 NOTE — Patient Instructions (Signed)
Order- DME [facility]- please change autopap range to 5-12, replace mask of choice and supplies (Hose leaks), continue humidifier, AirView/ card  Cody Thomas- Try wearing your CPAP routinely when you sleep again, once [place] gets things fixed up for you.   If you feel better off without it then, it will be ok to stop using it if you want .  Please call us if we can help

## 2019-01-17 NOTE — Progress Notes (Signed)
01/17/2019- 88 yoM former smoker (2PPD until 1986) establishing for OSA, complicated by hx lung nodules, BPH, Bladder Cancer, CKD 3, Low back pain/ sciatica, CAD, GERD, HBP, DM2  ,  NPSG 12/03/16- AHI 7.3/ hr, desaturation to 82%, body weight 184 lbs Epworth score 4 CPAP auto 5-15/ [place] (2018)  >> today auto 5-12 Body weight 184 lbs He stopped using CPAP when he got Shingles on face Jan, 2020. He was noting leaks at his full-face mask and hose.  Denies problems with shortness of breath, cough or wheeze. Had flu vax.  Prior to Admission medications   Medication Sig Start Date End Date Taking? Authorizing Provider  aspirin EC 81 MG tablet Take 1 tablet (81 mg total) by mouth daily. 04/22/17  Yes Jerline Pain, MD  b complex vitamins capsule Take 1 capsule by mouth daily.   Yes [provider]  calcium carbonate (TUMS - DOSED IN MG ELEMENTAL CALCIUM) 500 MG chewable tablet Chew 2 tablets by mouth daily as needed. For indigestion.    Yes [provider]  Coenzyme Q10-Vitamin E 100-1 MG-UNT/5ML SYRP Take 100 mg by mouth daily. Take 2 tablespoons (100MG ) Daily   Yes [provider]  fish oil-omega-3 fatty acids 1000 MG capsule Take 1,200 mg by mouth daily. 4 1200 tablets daily   Yes [provider]  fluticasone (FLONASE) 50 MCG/ACT nasal spray Place 1 spray into both nostrils daily as needed for allergies or rhinitis.   Yes [provider]  LORazepam (ATIVAN) 0.5 MG tablet TK 1/2 TO 1 T PO QD PRN 01/30/16  Yes [provider]  losartan-hydrochlorothiazide (HYZAAR) 100-25 MG tablet TAKE 1 TABLET BY MOUTH EVERY DAY 09/05/18  Yes Jerline Pain, MD  metFORMIN (GLUCOPHAGE-XR) 500 MG 24 hr tablet TAKE 1 TABLET BY MOUTH TWICE DAILY 09/03/18  Yes Elayne Snare, MD  metoprolol tartrate (LOPRESSOR) 25 MG tablet TAKE 1 TABLET BY MOUTH TWICE DAILY 09/05/18  Yes Jerline Pain, MD  rosuvastatin (CRESTOR) 20 MG tablet Take 20 mg by mouth See admin  instructions. Take 1 tablet by mouth three times weekly.   Yes [provider]  traMADol (ULTRAM) 50 MG tablet TAKE 1 TABLET BY MOUTH EVERY 6 HOURS AS NEEDED 09/01/17  Yes Jessy Oto, MD  valACYclovir (VALTREX) 1000 MG tablet TK 1 T PO TID FOR 10 DAYS 02/01/18  Yes [provider]   Past Medical History:  Diagnosis Date  . Anxiety   . Bunion 09/29/2011  . Cancer (HCC)    nose/Dr. Albertini;skin  . Chronic constipation   . Chronic kidney disease    stage 111  . Coronary atherosclerosis of native coronary artery    2009 LAD CIRC DES  . Depression    doesn't take any meds for this  . Diabetes mellitus    takes Januvia daily  . Dizziness   . Enlarged prostate    but doesn't require meds at present  . GERD (gastroesophageal reflux disease)    TUMS prn  . H/O hiatal hernia   . Headache(784.0)   . History of gout    doesn't require meds  . Hx of cardiac cath   . Hyperlipidemia    Crestor 3 x wk and Zetia daily  . Hypertension    takes Hyzaar daily  . Insomnia    doesn't require meds at present time  . Joint pain   . Onychomycosis 10/05/2012   x 10  . PONV (postoperative nausea and vomiting)  pt states extremely sick  . Shortness of breath    with exertion   Past Surgical History:  Procedure Laterality Date  . bladder cancer    . CARDIAC CATHETERIZATION  2013  . cataracts removed    . CHOLECYSTECTOMY    . COLONOSCOPY    . CORONARY ANGIOPLASTY     2 stents from 2009  . CORONARY ARTERY BYPASS GRAFT  10/29/2011   Procedure: CORONARY ARTERY BYPASS GRAFTING (CABG);  Surgeon: Ivin Poot, MD;  Location: [facility];  Service: Open Heart Surgery;  Laterality: N/A;  . cyst removed from finger     right hand  . EYE SURGERY     bilateral cataracts  . HEMORRHOID SURGERY    . HERNIA REPAIR     x 2;inguinal  . KIDNEY STONE SURGERY    . LEFT HEART CATHETERIZATION WITH CORONARY ANGIOGRAM N/A 10/21/2011   Procedure: LEFT HEART CATHETERIZATION WITH CORONARY  ANGIOGRAM;  Surgeon: Candee Furbish, MD;  Location: [facility] CATH LAB;  Service: Cardiovascular;  Laterality: N/A;  . rotator cuff surgery     right   . skin cancer removed     Family History  Problem Relation Age of Onset  . Heart disease Mother   . Stroke Father   . Cancer Brother    Social History   Socioeconomic History  . Marital status: Married    Spouse name: Not on file  . Number of children: 2  . Years of education: HS  . Highest education level: Not on file  Occupational History  . Occupation: Retired  Scientific laboratory technician  . Financial resource strain: Not on file  . Food insecurity    Worry: Not on file    Inability: Not on file  . Transportation needs    Medical: Not on file    Non-medical: Not on file  Tobacco Use  . Smoking status: Former Smoker    Packs/day: 1.50    Years: 30.00    Pack years: 45.00    Types: Cigarettes    Quit date: 03/02/1984    Years since quitting: 34.9  . Smokeless tobacco: Never Used  Substance and Sexual Activity  . Alcohol use: No    Alcohol/week: 0.0 standard drinks  . Drug use: No  . Sexual activity: Not Currently  Lifestyle  . Physical activity    Days per week: Not on file    Minutes per session: Not on file  . Stress: Not on file  Relationships  . Social Herbalist on phone: Not on file    Gets together: Not on file    Attends religious service: Not on file    Active member of club or organization: Not on file    Attends meetings of clubs or organizations: Not on file    Relationship status: Not on file  . Intimate partner violence    Fear of current or ex partner: Not on file    Emotionally abused: Not on file    Physically abused: Not on file    Forced sexual activity: Not on file  Other Topics Concern  . Not on file  Social History Narrative   Lives at home with his wife and great-granddaughter.   Right-handed.   Occasional caffeine use.   ROS-see HPI   + = positive Constitutional:    weight loss, night  sweats, fevers, chills, fatigue, lassitude. HEENT:    headaches, difficulty swallowing, tooth/dental problems, sore throat,  sneezing, itching, ear ache, +nasal congestion, post nasal drip, snoring CV:    chest pain, orthopnea, PND, swelling in lower extremities, anasarca,                                  dizziness, palpitations Resp:   shortness of breath with exertion or at rest.                productive cough,   non-productive cough, coughing up of blood.              change in color of mucus.  wheezing.   Skin:    rash or lesions. GI: + heartburn, +indigestion, abdominal pain, nausea, vomiting, diarrhea,                 change in bowel habits, loss of appetite GU: dysuria, change in color of urine, no urgency or frequency.   flank pain. MS:   +joint pain, +stiffness, decreased range of motion, back pain. Neuro-     nothing unusual Psych:  change in mood or affect.  depression or +anxiety.   memory loss.  OBJ- Physical Exam General- Alert, Oriented, Affect-appropriate, Distress- none acute Skin- rash-none, lesions- none, excoriation- none Lymphadenopathy- none Head- atraumatic            Eyes- Gross vision intact, PERRLA, conjunctivae and secretions clear            Ears- Hearing, canals-normal            Nose- Clear, no-Septal dev, mucus, polyps, erosion, perforation             Throat- Mallampati III , mucosa clear , drainage- none, tonsils- atrophic,  + dentures Neck- flexible , trachea midline, no stridor , thyroid nl, carotid no bruit Chest - symmetrical excursion , unlabored           Heart/CV- RRR , no murmur , no gallop  , no rub, nl s1 s2                           - JVD- none , edema- none, stasis changes- none, varices- none           Lung- clear to P&A, wheeze- none, cough- none , dullness-none, rub- none           Chest wall-  Abd-  Br/ Gen/ Rectal- Not done, not indicated Extrem- cyanosis- none, clubbing, none, atrophy- none, strength- nl Neuro- grossly intact  to observation

## 2019-02-05 NOTE — Assessment & Plan Note (Signed)
Last CT 09/2016 again considered nodules stable and likely benign with no follow-up required.  Consider future CXR

## 2019-02-05 NOTE — Assessment & Plan Note (Addendum)
Had benefited from CPAP before interruption due to shingles. His initial AHI was very low. We discussed cost-benefit considerations. It is hard to say that there is significant medical benefit for Cody Thomas continuing CPAP unless it helps with symptoms Plan- reduce pressure auto 5-12 to reduce leak. He will decide whether to continue CPAP after this presure adjustment. Address mask/ hose leak

## 2019-02-15 ENCOUNTER — Other Ambulatory Visit: Payer: Self-pay

## 2019-02-15 ENCOUNTER — Encounter: Payer: Self-pay | Admitting: Podiatry

## 2019-02-15 ENCOUNTER — Ambulatory Visit (INDEPENDENT_AMBULATORY_CARE_PROVIDER_SITE_OTHER): Payer: Medicare Other | Admitting: Podiatry

## 2019-02-15 DIAGNOSIS — M79674 Pain in right toe(s): Secondary | ICD-10-CM

## 2019-02-15 DIAGNOSIS — M79675 Pain in left toe(s): Secondary | ICD-10-CM | POA: Diagnosis not present

## 2019-02-15 DIAGNOSIS — B351 Tinea unguium: Secondary | ICD-10-CM | POA: Diagnosis not present

## 2019-02-18 NOTE — Progress Notes (Signed)
Subjective: Cody Thomas is seen today for follow up painful, elongated, thickened toenails bilateral feet that she cannot cut. Pain interferes with daily activities. Aggravating factor includes wearing enclosed shoe gear and relieved with periodic debridement.  He is also diabetic and takes Metformin for his diabetes.  He would like to discuss treatment options for onychomycosis on today.   Medications reviewed in chart.  Allergies  Allergen Reactions  . Crestor [Rosuvastatin]     Nightmares in high dosage  . Sertraline Diarrhea  . Zocor [Simvastatin]     Nightmares in high dosage    Objective:  Vascular Examination: Capillary refill time immediate b/l.  Dorsalis pedis present b/l.  Posterior tibial pulses present b/l.  Digital hair absent b/l.  Skin temperature gradient WNL b/l.   Dermatological Examination: Skin thin and atrophic b/l.  Toenails 1-5 b/l discolored, thick, dystrophic with subungual debris and pain with palpation to nailbeds due to thickness of nails.  Musculoskeletal: Muscle strength 5/5 to all LE muscle groups b/l.  No gross bony deformities b/l.  No pain, crepitus or joint limitation noted with ROM.   Neurological Examination: Protective sensation intact 5/5 with 10 gram monofilament bilaterally.  Epicritic sensation present bilaterally.  Vibratory sensation intact bilaterally.   Assessment: Painful onychomycosis toenails 1-5 b/l  NIDDM  Plan: 1. Discussed topical treatment options for onychomycosis. Discussed topical antifungal from [place]. He would like to think about it. 2. Toenails 1-5 b/l were debrided in length and girth without iatrogenic bleeding. 3. Patient to continue soft, supportive shoe gear daily. 4. Patient to report any pedal injuries to medical professional immediately. 5. Follow up 3 months.  6. Patient/POA to call should there be a concern in the interim.

## 2019-02-27 ENCOUNTER — Other Ambulatory Visit: Payer: Self-pay | Admitting: Endocrinology

## 2019-03-09 DIAGNOSIS — B349 Viral infection, unspecified: Secondary | ICD-10-CM | POA: Diagnosis not present

## 2019-03-10 DIAGNOSIS — B349 Viral infection, unspecified: Secondary | ICD-10-CM | POA: Diagnosis not present

## 2019-03-12 ENCOUNTER — Other Ambulatory Visit: Payer: Self-pay | Admitting: Unknown Physician Specialty

## 2019-03-12 ENCOUNTER — Telehealth: Payer: Self-pay | Admitting: Unknown Physician Specialty

## 2019-03-12 DIAGNOSIS — U071 COVID-19: Secondary | ICD-10-CM

## 2019-03-12 NOTE — Telephone Encounter (Signed)
  I connected by phone with Drake Leach on 03/12/2019 at 4:56 PM to discuss the potential use of an new treatment for mild to moderate COVID-19 viral infection in non-hospitalized patients.  This patient is a 86 y.o. male that meets the FDA criteria for Emergency Use Authorization of bamlanivimab or casirivimab\imdevimab.  Has a (+) direct SARS-CoV-2 viral test result  Has mild or moderate COVID-19   Is ? 86 years of age and weighs ? 40 kg  Is NOT hospitalized due to COVID-19  Is NOT requiring oxygen therapy or requiring an increase in baseline oxygen flow rate due to COVID-19  Is within 10 days of symptom onset  Has at least one of the high risk factor(s) for progression to severe COVID-19 and/or hospitalization as defined in EUA.  Specific high risk criteria : >/= 84 yo treated by PCP   I have spoken and communicated the following to the patient or parent/caregiver:  1. FDA has authorized the emergency use of bamlanivimab and casirivimab\imdevimab for the treatment of mild to moderate COVID-19 in adults and pediatric patients with positive results of direct SARS-CoV-2 viral testing who are 70 years of age and older weighing at least 40 kg, and who are at high risk for progressing to severe COVID-19 and/or hospitalization.  2. The significant known and potential risks and benefits of bamlanivimab and casirivimab\imdevimab, and the extent to which such potential risks and benefits are unknown.  3. Information on available alternative treatments and the risks and benefits of those alternatives, including clinical trials.  4. Patients treated with bamlanivimab and casirivimab\imdevimab should continue to self-isolate and use infection control measures (e.g., wear mask, isolate, social distance, avoid sharing personal items, clean and disinfect "high touch" surfaces, and frequent handwashing) according to CDC guidelines.   5. The patient or parent/caregiver has the option to accept  or refuse bamlanivimab or casirivimab\imdevimab .  After reviewing this information with the patient, The patient agreed to proceed with receiving the bamlanimivab infusion and will be provided a copy of the Fact sheet prior to receiving the infusion.Kathrine Haddock 03/12/2019 4:56 PM Symptom onset 1/5

## 2019-03-15 ENCOUNTER — Ambulatory Visit (HOSPITAL_COMMUNITY)
Admission: RE | Admit: 2019-03-15 | Discharge: 2019-03-15 | Disposition: A | Discharge status: Home / Self Care | Payer: Medicare Other | Source: Ambulatory Visit | Attending: Pulmonary Disease | Admitting: Pulmonary Disease

## 2019-03-15 DIAGNOSIS — Z23 Encounter for immunization: Secondary | ICD-10-CM | POA: Diagnosis not present

## 2019-03-15 DIAGNOSIS — U071 COVID-19: Secondary | ICD-10-CM

## 2019-03-15 MED ORDER — METHYLPREDNISOLONE SODIUM SUCC 125 MG IJ SOLR: 125.0000 mg | Freq: Once | INTRAMUSCULAR | Status: DC | PRN

## 2019-03-15 MED ORDER — EPINEPHRINE 0.3 MG/0.3ML IJ SOAJ: 0.3000 mg | Freq: Once | INTRAMUSCULAR | Status: DC | PRN

## 2019-03-15 MED ORDER — ALBUTEROL SULFATE HFA 108 (90 BASE) MCG/ACT IN AERS: 2.0000 | INHALATION_SPRAY | Freq: Once | RESPIRATORY_TRACT | Status: DC | PRN

## 2019-03-15 MED ORDER — FAMOTIDINE IN NACL 20-0.9 MG/50ML-% IV SOLN: 20.0000 mg | Freq: Once | INTRAVENOUS | Status: DC | PRN

## 2019-03-15 MED ORDER — SODIUM CHLORIDE 0.9 % IV SOLN
INTRAVENOUS | Status: DC | PRN
  Administered 2019-03-15: 250 mL via INTRAVENOUS

## 2019-03-15 MED ORDER — DIPHENHYDRAMINE HCL 50 MG/ML IJ SOLN: 50.0000 mg | Freq: Once | INTRAMUSCULAR | Status: DC | PRN

## 2019-03-15 MED ORDER — SODIUM CHLORIDE 0.9 % IV SOLN
700.0000 mg | Freq: Once | INTRAVENOUS | Status: AC
  Administered 2019-03-15: 700 mg via INTRAVENOUS
  Filled 2019-03-15: qty 20

## 2019-03-15 NOTE — Discharge Instructions (Signed)

## 2019-03-15 NOTE — Progress Notes (Signed)
  Diagnosis: COVID-19  Physician: Dr. Joya Gaskins  Procedure: [facility] Med: bamlanivimab infusion - Provided patient with bamlanimivab fact sheet for patients, parents and caregivers prior to infusion.  Complications: No immediate complications noted.  Discharge: Discharged home   Cody Thomas, Cody Thomas 03/15/2019

## 2019-03-18 ENCOUNTER — Encounter (HOSPITAL_COMMUNITY): Payer: Self-pay | Admitting: Emergency Medicine

## 2019-03-18 ENCOUNTER — Inpatient Hospital Stay (HOSPITAL_COMMUNITY)
Admission: EM | Admit: 2019-03-18 | Discharge: 2019-03-20 | DRG: 310 | Disposition: A | Discharge status: Home / Self Care | Payer: Medicare Other | Attending: Internal Medicine | Admitting: Internal Medicine

## 2019-03-18 ENCOUNTER — Other Ambulatory Visit: Payer: Self-pay

## 2019-03-18 ENCOUNTER — Emergency Department (HOSPITAL_COMMUNITY): Payer: Medicare Other

## 2019-03-18 DIAGNOSIS — I2510 Atherosclerotic heart disease of native coronary artery without angina pectoris: Secondary | ICD-10-CM | POA: Diagnosis not present

## 2019-03-18 DIAGNOSIS — E1136 Type 2 diabetes mellitus with diabetic cataract: Secondary | ICD-10-CM | POA: Diagnosis present

## 2019-03-18 DIAGNOSIS — E1169 Type 2 diabetes mellitus with other specified complication: Secondary | ICD-10-CM | POA: Diagnosis not present

## 2019-03-18 DIAGNOSIS — Z888 Allergy status to other drugs, medicaments and biological substances status: Secondary | ICD-10-CM

## 2019-03-18 DIAGNOSIS — Z8551 Personal history of malignant neoplasm of bladder: Secondary | ICD-10-CM

## 2019-03-18 DIAGNOSIS — Z87891 Personal history of nicotine dependence: Secondary | ICD-10-CM

## 2019-03-18 DIAGNOSIS — D696 Thrombocytopenia, unspecified: Secondary | ICD-10-CM | POA: Diagnosis not present

## 2019-03-18 DIAGNOSIS — F419 Anxiety disorder, unspecified: Secondary | ICD-10-CM | POA: Diagnosis not present

## 2019-03-18 DIAGNOSIS — I4821 Permanent atrial fibrillation: Secondary | ICD-10-CM | POA: Diagnosis present

## 2019-03-18 DIAGNOSIS — I480 Paroxysmal atrial fibrillation: Secondary | ICD-10-CM | POA: Diagnosis present

## 2019-03-18 DIAGNOSIS — E785 Hyperlipidemia, unspecified: Secondary | ICD-10-CM | POA: Diagnosis not present

## 2019-03-18 DIAGNOSIS — G4733 Obstructive sleep apnea (adult) (pediatric): Secondary | ICD-10-CM | POA: Diagnosis not present

## 2019-03-18 DIAGNOSIS — E1122 Type 2 diabetes mellitus with diabetic chronic kidney disease: Secondary | ICD-10-CM | POA: Diagnosis present

## 2019-03-18 DIAGNOSIS — N1831 Chronic kidney disease, stage 3a: Secondary | ICD-10-CM | POA: Diagnosis present

## 2019-03-18 DIAGNOSIS — I10 Essential (primary) hypertension: Secondary | ICD-10-CM | POA: Diagnosis not present

## 2019-03-18 DIAGNOSIS — I6523 Occlusion and stenosis of bilateral carotid arteries: Secondary | ICD-10-CM | POA: Diagnosis not present

## 2019-03-18 DIAGNOSIS — Z7984 Long term (current) use of oral hypoglycemic drugs: Secondary | ICD-10-CM

## 2019-03-18 DIAGNOSIS — Z6832 Body mass index (BMI) 32.0-32.9, adult: Secondary | ICD-10-CM

## 2019-03-18 DIAGNOSIS — N183 Chronic kidney disease, stage 3 unspecified: Secondary | ICD-10-CM | POA: Diagnosis present

## 2019-03-18 DIAGNOSIS — Z8249 Family history of ischemic heart disease and other diseases of the circulatory system: Secondary | ICD-10-CM | POA: Diagnosis not present

## 2019-03-18 DIAGNOSIS — E669 Obesity, unspecified: Secondary | ICD-10-CM | POA: Diagnosis present

## 2019-03-18 DIAGNOSIS — Z8616 Personal history of COVID-19: Secondary | ICD-10-CM | POA: Diagnosis not present

## 2019-03-18 DIAGNOSIS — N1832 Chronic kidney disease, stage 3b: Secondary | ICD-10-CM | POA: Diagnosis not present

## 2019-03-18 DIAGNOSIS — Z7982 Long term (current) use of aspirin: Secondary | ICD-10-CM

## 2019-03-18 DIAGNOSIS — I340 Nonrheumatic mitral (valve) insufficiency: Secondary | ICD-10-CM | POA: Diagnosis not present

## 2019-03-18 DIAGNOSIS — I4891 Unspecified atrial fibrillation: Secondary | ICD-10-CM

## 2019-03-18 DIAGNOSIS — Z85828 Personal history of other malignant neoplasm of skin: Secondary | ICD-10-CM

## 2019-03-18 DIAGNOSIS — R55 Syncope and collapse: Secondary | ICD-10-CM | POA: Diagnosis not present

## 2019-03-18 DIAGNOSIS — Z79899 Other long term (current) drug therapy: Secondary | ICD-10-CM

## 2019-03-18 DIAGNOSIS — Z955 Presence of coronary angioplasty implant and graft: Secondary | ICD-10-CM | POA: Diagnosis not present

## 2019-03-18 DIAGNOSIS — I361 Nonrheumatic tricuspid (valve) insufficiency: Secondary | ICD-10-CM | POA: Diagnosis not present

## 2019-03-18 DIAGNOSIS — Z951 Presence of aortocoronary bypass graft: Secondary | ICD-10-CM | POA: Diagnosis not present

## 2019-03-18 DIAGNOSIS — E7800 Pure hypercholesterolemia, unspecified: Secondary | ICD-10-CM | POA: Diagnosis not present

## 2019-03-18 DIAGNOSIS — R001 Bradycardia, unspecified: Secondary | ICD-10-CM | POA: Diagnosis present

## 2019-03-18 DIAGNOSIS — F329 Major depressive disorder, single episode, unspecified: Secondary | ICD-10-CM | POA: Diagnosis not present

## 2019-03-18 DIAGNOSIS — R0602 Shortness of breath: Secondary | ICD-10-CM | POA: Diagnosis not present

## 2019-03-18 DIAGNOSIS — Z823 Family history of stroke: Secondary | ICD-10-CM | POA: Diagnosis not present

## 2019-03-18 DIAGNOSIS — U071 COVID-19: Secondary | ICD-10-CM | POA: Diagnosis not present

## 2019-03-18 DIAGNOSIS — I129 Hypertensive chronic kidney disease with stage 1 through stage 4 chronic kidney disease, or unspecified chronic kidney disease: Secondary | ICD-10-CM | POA: Diagnosis not present

## 2019-03-18 DIAGNOSIS — R0600 Dyspnea, unspecified: Secondary | ICD-10-CM | POA: Diagnosis not present

## 2019-03-18 LAB — BASIC METABOLIC PANEL
Anion gap: 11 (ref 5–15)
BUN: 26 mg/dL — ABNORMAL HIGH (ref 8–23)
CO2: 26 mmol/L (ref 22–32)
Calcium: 8.9 mg/dL (ref 8.9–10.3)
Chloride: 97 mmol/L — ABNORMAL LOW (ref 98–111)
Creatinine, Ser: 1.64 mg/dL — ABNORMAL HIGH (ref 0.61–1.24)
GFR calc Af Amer: 43 mL/min — ABNORMAL LOW (ref >60)
GFR calc non Af Amer: 37 mL/min — ABNORMAL LOW (ref >60)
Glucose, Bld: 140 mg/dL — ABNORMAL HIGH (ref 70–99)
Potassium: 3.7 mmol/L (ref 3.5–5.1)
Sodium: 134 mmol/L — ABNORMAL LOW (ref 135–145)

## 2019-03-18 LAB — CBG MONITORING, ED: Glucose-Capillary: 134 mg/dL — ABNORMAL HIGH (ref 70–99)

## 2019-03-18 LAB — CBC
HCT: 45.4 % (ref 39.0–52.0)
Hemoglobin: 15.7 g/dL (ref 13.0–17.0)
MCH: 32.9 pg (ref 26.0–34.0)
MCHC: 34.6 g/dL (ref 30.0–36.0)
MCV: 95.2 fL (ref 80.0–100.0)
Platelets: 157 10*3/uL (ref 150–400)
RBC: 4.77 MIL/uL (ref 4.22–5.81)
RDW: 12.1 % (ref 11.5–15.5)
WBC: 6.1 10*3/uL (ref 4.0–10.5)
nRBC: 0 % (ref 0.0–0.2)

## 2019-03-18 LAB — TROPONIN I (HIGH SENSITIVITY)
Troponin I (High Sensitivity): 12 ng/L (ref <18)
Troponin I (High Sensitivity): 13 ng/L (ref <18)

## 2019-03-18 LAB — LACTATE DEHYDROGENASE: LDH: 133 U/L (ref 98–192)

## 2019-03-18 MED ORDER — SODIUM CHLORIDE 0.9 % IV BOLUS
500.0000 mL | Freq: Once | INTRAVENOUS | Status: AC
  Administered 2019-03-18: 500 mL via INTRAVENOUS

## 2019-03-18 MED ORDER — INSULIN ASPART 100 UNIT/ML ~~LOC~~ SOLN: 0.0000 [IU] | Freq: Every day | SUBCUTANEOUS | Status: DC

## 2019-03-18 MED ORDER — INSULIN ASPART 100 UNIT/ML ~~LOC~~ SOLN
0.0000 [IU] | Freq: Three times a day (TID) | SUBCUTANEOUS | Status: DC
  Administered 2019-03-19: 2 [IU] via SUBCUTANEOUS
  Administered 2019-03-19: 1 [IU] via SUBCUTANEOUS
  Administered 2019-03-19: 2 [IU] via SUBCUTANEOUS

## 2019-03-18 MED ORDER — SODIUM CHLORIDE 0.9% FLUSH
3.0000 mL | Freq: Once | INTRAVENOUS | Status: AC
  Administered 2019-03-18: 3 mL via INTRAVENOUS

## 2019-03-18 MED ORDER — SODIUM CHLORIDE 0.9 % IV SOLN: INTRAVENOUS | Status: DC

## 2019-03-18 MED ORDER — ACETAMINOPHEN 325 MG PO TABS: 650.0000 mg | ORAL_TABLET | Freq: Four times a day (QID) | ORAL | Status: DC | PRN

## 2019-03-18 NOTE — ED Triage Notes (Signed)
Pt reports feeling like heart is fluttering today.  Reports 2-3 episodes of breathing fast and feeling like he was going to pass out.  Denies chest pain or SOB at this time.

## 2019-03-18 NOTE — H&P (Addendum)
TRH H&P    Patient Demographics:    Cody Thomas, is a 86 y.o. male  MRN: [id number]  DOB - 01/15/1931  Admit Date - 03/18/2019  Referring MD/NP/PA: Madilyn Hook  Outpatient Primary MD for the patient is Juluis Rainier, MD Donato Schultz - cardiology Kathlee Nations Tright - CT surgery  Patient coming from:  home  Chief complaint- irregular hb   HPI:    Cody Thomas  is a 86 y.o. male,  w anxiety/ depression, hypertension, hyperlipidemia, Dm2, CKD stage3, CAD s/p CABG,  s/p DES LAD, mild OSA, recent Covid infection s/p remdesivir 03/15/19 presents w c/o ? Falling asleep vs syncope x3 this afternoon, as well as sensation of irregular hb, and slight dyspnea.  Pt denies fever, chills, cough, cp, n/v, diarrhea, brbpr, black stool, dysuria.   In ED,  T 98.8, P 74,  R 16, Bp 124/87  Pox 98% on RA  Na 134, K 3.7, Bun 26, Creatinine 1.64 Wbc 6.1, Hgb 15.7, Plt 157 Crp 0.7 Ferritin 177 Fibrinogen 465 LDH 133,   D dimer 0.82 BNP 786.5 Trop 12->13  ED spoke with cardiology fellow who recommended outpatient holter/ event monitor per ED, however ED unable to set up so requesting obs admission   Pt will be admitted for new onset Afib, ? syncope       Review of systems:    In addition to the HPI above,  No Fever-chills, No Headache, No changes with Vision or hearing, No problems swallowing food or Liquids, No Chest pain, No Cough   No Abdominal pain, No Nausea or Vomiting, bowel movements are regular, No Blood in stool or Urine, No dysuria, No new skin rashes or bruises, No new joints pains-aches,  No new weakness, tingling, numbness in any extremity, No recent weight gain or loss, No polyuria, polydypsia or polyphagia, No significant Mental Stressors.  All other systems reviewed and are negative.    Past History of the following :    Past Medical History:  Diagnosis Date  . Anxiety   .  Bunion 09/29/2011  . Cancer (HCC)    nose/Dr. Albertini;skin  . Chronic constipation   . Chronic kidney disease    stage 111  . Coronary atherosclerosis of native coronary artery    2009 LAD CIRC DES  . Depression    doesn't take any meds for this  . Diabetes mellitus    takes Januvia daily  . Dizziness   . Enlarged prostate    but doesn't require meds at present  . GERD (gastroesophageal reflux disease)    TUMS prn  . H/O hiatal hernia   . Headache(784.0)   . History of gout    doesn't require meds  . Hx of cardiac cath   . Hyperlipidemia    Crestor 3 x wk and Zetia daily  . Hypertension    takes Hyzaar daily  . Insomnia    doesn't require meds at present time  . Joint pain   . Onychomycosis 10/05/2012   x 10  . PONV (postoperative nausea  and vomiting)    pt states extremely sick  . Shortness of breath    with exertion      Past Surgical History:  Procedure Laterality Date  . bladder cancer    . CARDIAC CATHETERIZATION  2013  . cataracts removed    . CHOLECYSTECTOMY    . COLONOSCOPY    . CORONARY ANGIOPLASTY     2 stents from 2009  . CORONARY ARTERY BYPASS GRAFT  10/29/2011   Procedure: CORONARY ARTERY BYPASS GRAFTING (CABG);  Surgeon: Kerin Perna, MD;  Location: [facility] OR;  Service: Open Heart Surgery;  Laterality: N/A;  . cyst removed from finger     right hand  . EYE SURGERY     bilateral cataracts  . HEMORRHOID SURGERY    . HERNIA REPAIR     x 2;inguinal  . KIDNEY STONE SURGERY    . LEFT HEART CATHETERIZATION WITH CORONARY ANGIOGRAM N/A 10/21/2011   Procedure: LEFT HEART CATHETERIZATION WITH CORONARY ANGIOGRAM;  Surgeon: Donato Schultz, MD;  Location: [facility] [place] [facility] CATH LAB;  Service: Cardiovascular;  Laterality: N/A;  . rotator cuff surgery     right   . skin cancer removed        Social History:      Social History   Tobacco Use  . Smoking status: Former Smoker    Packs/day: 1.50    Years: 30.00    Pack years: 45.00    Types: Cigarettes    Quit date:  03/02/1984    Years since quitting: 35.0  . Smokeless tobacco: Never Used  Substance Use Topics  . Alcohol use: No    Alcohol/week: 0.0 standard drinks       Family History :     Family History  Problem Relation Age of Onset  . Heart disease Mother   . Stroke Father   . Cancer Brother        Home Medications:   Prior to Admission medications   Medication Sig Start Date End Date Taking? Authorizing Provider  aspirin EC 81 MG tablet Take 1 tablet (81 mg total) by mouth daily. 04/22/17  Yes Jake Bathe, MD  b complex vitamins capsule Take 1 capsule by mouth daily.   Yes [provider]  calcium carbonate (TUMS - DOSED IN MG ELEMENTAL CALCIUM) 500 MG chewable tablet Chew 2 tablets by mouth daily as needed. For indigestion.    Yes [provider]  Coenzyme Q10-Vitamin E 100-1 MG-UNT/5ML SYRP Take 100 mg by mouth daily. Take 2 tablespoons (100MG ) Daily   Yes [provider]  fish oil-omega-3 fatty acids 1000 MG capsule Take 1,200 mg by mouth daily. 4 1200 tablets daily   Yes [provider]  fluticasone (FLONASE) 50 MCG/ACT nasal spray Place 1 spray into both nostrils daily as needed for allergies or rhinitis.   Yes [provider]  LORazepam (ATIVAN) 0.5 MG tablet Take 0.25-0.5 mg by mouth daily as needed for anxiety.  01/30/16  Yes [provider]  losartan-hydrochlorothiazide (HYZAAR) 100-25 MG tablet TAKE 1 TABLET BY MOUTH EVERY DAY 09/05/18  Yes Jake Bathe, MD  metFORMIN (GLUCOPHAGE-XR) 500 MG 24 hr tablet TAKE 1 TABLET BY MOUTH TWICE DAILY 02/27/19  Yes Reather Littler, MD  metoprolol tartrate (LOPRESSOR) 25 MG tablet TAKE 1 TABLET BY MOUTH TWICE DAILY Patient taking differently: Take 25 mg by mouth 2 (two) times daily.  09/05/18  Yes Jake Bathe, MD  rosuvastatin (CRESTOR) 20 MG tablet Take 20 mg by mouth See  admin instructions. Take 1 tablet by mouth three times weekly.   Yes [provider]  traMADol (ULTRAM) 50 MG  tablet TAKE 1 TABLET BY MOUTH EVERY 6 HOURS AS NEEDED Patient taking differently: Take 50 mg by mouth every 6 (six) hours as needed (pain).  09/01/17  Yes Kerrin Champagne, MD     Allergies:     Allergies  Allergen Reactions  . Crestor [Rosuvastatin]     Nightmares in high dosage  . Sertraline Diarrhea  . Zocor [Simvastatin]     Nightmares in high dosage     Physical Exam:   Vitals  Blood pressure 140/68, pulse 61, temperature 98.8 F (37.1 C), temperature source Oral, resp. rate (!) 28, SpO2 95 %.  1.  General: axoxo3  2. Psychiatric: euthymic  3. Neurologic: Cn2-12 intact, reflexes 2+ symmetric, diffuse with no clonus, motor 5/5 in all 4 ext  4. HEENMT:  Anciteric, pupils 1.12mm symmetric, direct, consensual , near intact eomi Neck: no jvd, no bruit  5. Respiratory : CTAB  6. Cardiovascular : Irr, irr, s1, s2, no m/g/r  7. Gastrointestinal:  Abd: soft, obese, nt, nd, +bs  8. Skin:  Ext: no c/c/e,   9.Musculoskeletal:  Good ROM    Data Review:    CBC Recent Labs  Lab 03/18/19 1828  WBC 6.1  HGB 15.7  HCT 45.4  PLT 157  MCV 95.2  MCH 32.9  MCHC 34.6  RDW 12.1   ------------------------------------------------------------------------------------------------------------------  Results for orders placed or performed during the hospital encounter of 03/18/19 (from the past 48 hour(s))  Basic metabolic panel     Status: Abnormal   Collection Time: 03/18/19  6:28 PM  Result Value Ref Range   Sodium 134 (L) 135 - 145 mmol/L   Potassium 3.7 3.5 - 5.1 mmol/L   Chloride 97 (L) 98 - 111 mmol/L   CO2 26 22 - 32 mmol/L   Glucose, Bld 140 (H) 70 - 99 mg/dL   BUN 26 (H) 8 - 23 mg/dL   Creatinine, Ser 8.29 (H) 0.61 - 1.24 mg/dL   Calcium 8.9 8.9 - 56.2 mg/dL   GFR calc non Af Amer 37 (L) >60 mL/min   GFR calc Af Amer 43 (L) >60 mL/min   Anion gap 11 5 - 15    Comment: Performed at [facility], [address] [place], [place] [address]  CBC      Status: None   Collection Time: 03/18/19  6:28 PM  Result Value Ref Range   WBC 6.1 4.0 - 10.5 K/uL   RBC 4.77 4.22 - 5.81 MIL/uL   Hemoglobin 15.7 13.0 - 17.0 g/dL   HCT 57.8 46.9 - 62.9 %   MCV 95.2 80.0 - 100.0 fL   MCH 32.9 26.0 - 34.0 pg   MCHC 34.6 30.0 - 36.0 g/dL   RDW 52.8 41.3 - 24.4 %   Platelets 157 150 - 400 K/uL   nRBC 0.0 0.0 - 0.2 %    Comment: Performed at [facility], [address] [place], [place] [address]  Troponin I (High Sensitivity)     Status: None   Collection Time: 03/18/19  6:28 PM  Result Value Ref Range   Troponin I (High Sensitivity) 12 <18 ng/L    Comment: (NOTE) Elevated high sensitivity troponin I (hsTnI) values and significant  changes across serial measurements may suggest ACS but many other  chronic and acute conditions are known to elevate hsTnI results.  Refer to the "Links" section for chest pain algorithms and additional  guidance. Performed at [facility], [address] [place], [place] [address]   Troponin I (High Sensitivity)     Status: None   Collection Time: 03/18/19  9:12 PM  Result Value Ref Range   Troponin I (High Sensitivity) 13 <18 ng/L    Comment: (NOTE) Elevated high sensitivity troponin I (hsTnI) values and significant  changes across serial measurements may suggest ACS but many other  chronic and acute conditions are known to elevate hsTnI results.  Refer to the "Links" section for chest pain algorithms and additional  guidance. Performed at [facility], [address], [place], [place] [address]   Brain natriuretic peptide     Status: Abnormal   Collection Time: 03/18/19 11:01 PM  Result Value Ref Range   B Natriuretic Peptide 786.5 (H) 0.0 - 100.0 pg/mL    Comment: Performed at [facility], [address], [place], [place] [address]  C-reactive protein     Status: None   Collection Time: 03/18/19 11:01 PM  Result Value Ref Range   CRP 0.7 <1.0 mg/dL    Comment: Performed at  [facility], [address] [place], [place] [address]  D-dimer, quantitative (not at [facility])     Status: Abnormal   Collection Time: 03/18/19 11:01 PM  Result Value Ref Range   D-Dimer, Quant 0.82 (H) 0.00 - 0.50 ug/mL-FEU    Comment: (NOTE) At the manufacturer cut-off of 0.50 ug/mL FEU, this assay has been documented to exclude PE with a sensitivity and negative predictive value of 97 to 99%.  At this time, this assay has not been approved by the FDA to exclude DVT/VTE. Results should be correlated with clinical presentation. Performed at [facility], [address] [place], [place] [address]   Ferritin     Status: None   Collection Time: 03/18/19 11:01 PM  Result Value Ref Range   Ferritin 177 24 - 336 ng/mL    Comment: Performed at [facility], [address], [place], [place] [address]  Fibrinogen     Status: None   Collection Time: 03/18/19 11:01 PM  Result Value Ref Range   Fibrinogen 465 210 - 475 mg/dL    Comment: Performed at [facility], [address], [place], [place] [address]  Hepatitis B surface antigen     Status: None   Collection Time: 03/18/19 11:01 PM  Result Value Ref Range   Hepatitis B Surface Ag NON REACTIVE NON REACTIVE    Comment: Performed at [facility], [address] [place], [place] [address]  Lactate dehydrogenase     Status: None   Collection Time: 03/18/19 11:01 PM  Result Value Ref Range   LDH 133 98 - 192 U/L    Comment: Performed at [facility] Lab, [address] [place], [place] [address]  CBG monitoring, ED     Status: Abnormal   Collection Time: 03/18/19 11:23 PM  Result Value Ref Range   Glucose-Capillary 134 (H) 70 - 99 mg/dL   Comment 1 Document in Chart     Chemistries  Recent Labs  Lab 03/18/19 1828  NA 134*  K 3.7  CL 97*  CO2 26  GLUCOSE 140*  BUN 26*  CREATININE 1.64*  CALCIUM 8.9    ------------------------------------------------------------------------------------------------------------------  ------------------------------------------------------------------------------------------------------------------ GFR: CrCl cannot be calculated (Unknown ideal weight.). Liver Function Tests: No  results for input(s): AST, ALT, ALKPHOS, BILITOT, PROT, ALBUMIN in the last 168 hours. No results for input(s): LIPASE, AMYLASE in the last 168 hours. No results for input(s): AMMONIA in the last 168 hours. Coagulation Profile: No results for input(s): INR, PROTIME in the last 168 hours. Cardiac Enzymes: No results for input(s): CKTOTAL, CKMB, CKMBINDEX, TROPONINI in the last 168 hours. BNP (last 3 results) No results for input(s): PROBNP in the last 8760 hours. HbA1C: No results for input(s): HGBA1C in the last 72 hours. CBG: Recent Labs  Lab 03/18/19 2323  GLUCAP 134*   Lipid Profile: No results for input(s): CHOL, HDL, LDLCALC, TRIG, CHOLHDL, LDLDIRECT in the last 72 hours. Thyroid Function Tests: No results for input(s): TSH, T4TOTAL, FREET4, T3FREE, THYROIDAB in the last 72 hours. Anemia Panel: Recent Labs    03/18/19 2301  FERRITIN 177    --------------------------------------------------------------------------------------------------------------- Urine analysis:    Component Value Date/Time   COLORURINE YELLOW 03/03/2016 0917   APPEARANCEUR CLEAR 03/03/2016 0917   LABSPEC 1.020 03/03/2016 0917   PHURINE 6.0 03/03/2016 0917   GLUCOSEU 100 (A) 03/03/2016 0917   HGBUR NEGATIVE 03/03/2016 0917   BILIRUBINUR NEGATIVE 03/03/2016 0917   KETONESUR NEGATIVE 03/03/2016 0917   PROTEINUR 30 (A) 03/27/2014 1418   UROBILINOGEN 0.2 03/03/2016 0917   NITRITE NEGATIVE 03/03/2016 0917   LEUKOCYTESUR NEGATIVE 03/03/2016 0917      Imaging Results:    DG Chest 2 View  Result Date: 03/18/2019 CLINICAL DATA:  86 year old male with shortness of breath. EXAM: CHEST -  2 VIEW COMPARISON:  Chest CT dated 10/29/2016. FINDINGS: No focal consolidation, pleural effusion or pneumothorax. The cardiac silhouette is within normal limits. Median sternotomy wires and CABG vascular clips. Atherosclerotic calcification of the aorta. The aorta is tortuous. No acute osseous pathology. IMPRESSION: No active cardiopulmonary disease. Electronically Signed   By: Elgie Collard M.D.   On: 03/18/2019 19:04   Afib at 70, nl axis, no st-t changes c/w ischemia   Assessment & Plan:    Principal Problem:   Atrial fibrillation (HCC) Active Problems:   Hypertension   Hyperlipidemia   Coronary atherosclerosis of native coronary artery   CKD (chronic kidney disease) stage 3, GFR 30-59 ml/min  Afib (chads2vasc=5) Tele Trop I  Check TSH Check cardiac echo Please consult cardiology in Am Please consult pharmacy for Elquis if CT brain negative  ? Syncope Check CT brain noncontrast Check carotid ultrasound Cycle cardiac markers  CAD s/p CABG Cont aspirin for now Cont Losartan-hydrochlorothiazide 100/25mg  po qday Cont lopressor 25mg  po bid Cont Crestor 20mg  po qhs  Dm2 Cont metformin 500mg  po bid fsbs ac and qhs, ISS   DVT Prophylaxis-    SCDs   AM Labs Ordered, also please review Full Orders  Family Communication: Admission, patients condition and plan of care including tests being ordered have been discussed with the patient  who indicate understanding and agree with the plan and Code Status.  Code Status:  FULL CODE per patient, notified wife that patient admitted to [facility]   Admission status: Observation : Based on patients clinical presentation and evaluation of above clinical data, I have made determination that patient meets Observation criteria at this time.  Time spent in minutes : 70 minutes   Pearson Grippe M.D on 03/19/2019 at 12:33 AM

## 2019-03-18 NOTE — ED Provider Notes (Signed)
[facility] EMERGENCY DEPARTMENT Provider Note   CSN: [id number] Arrival date & time: 03/18/19  1806     History Chief Complaint  Patient presents with  . heart fluttering  . Near Syncope    NEMECIO MILNER is a 86 y.o. male.  The history is provided by the patient and medical records. No language interpreter was used.  Near Syncope   JUANYE ZIOBRO is a 86 y.o. male who presents to the Emergency Department complaining of palpitations.  He presents to the ED for evaluation of palpitations and near syncopal event that occurred today.  He was given a BP monitor and it read out arrhythmia four times today.  He thought he was having a panic attack and took half an ativan this afternoon and his palpitations resolved.  When he checked his heart rate it was in the 60s.   He developed fever 1/5. He was then diagnosed with COVID-19 on January 8.  He had some associated sob with his palpitations - sob now resolved.  He had diarrhea today. No leg edema.    Fevers are now resolved.  He has persistent dry cough. Denies chest pain,     Past Medical History:  Diagnosis Date  . Anxiety   . Bunion 09/29/2011  . Cancer (HCC)    nose/Dr. Albertini;skin  . Chronic constipation   . Chronic kidney disease    stage 111  . Coronary atherosclerosis of native coronary artery    2009 LAD CIRC DES  . Depression    doesn't take any meds for this  . Diabetes mellitus    takes Januvia daily  . Dizziness   . Enlarged prostate    but doesn't require meds at present  . GERD (gastroesophageal reflux disease)    TUMS prn  . H/O hiatal hernia   . Headache(784.0)   . History of gout    doesn't require meds  . Hx of cardiac cath   . Hyperlipidemia    Crestor 3 x wk and Zetia daily  . Hypertension    takes Hyzaar daily  . Insomnia    doesn't require meds at present time  . Joint pain   . Onychomycosis 10/05/2012   x 10  . PONV (postoperative nausea and vomiting)    pt  states extremely sick  . Shortness of breath    with exertion    Patient Active Problem List   Diagnosis Date Noted  . OSA (obstructive sleep apnea) 12/14/2016  . Obese 12/14/2016  . Degenerative disc disease, lumbar 02/13/2016  . Chronic left-sided low back pain with left-sided sciatica 02/13/2016  . Left thyroid nodule 01/21/2015  . Pulmonary nodules/lesions, multiple 10/30/2014  . CKD (chronic kidney disease) stage 3, GFR 30-59 ml/min 09/21/2014  . Arcus senilis of both eyes 09/06/2014  . Ectropion of right eye 09/06/2014  . Bladder cancer ([place]) 08/09/2014  . Recurrent nephrolithiasis 04/23/2014  . Benign nodular prostatic hyperplasia with lower urinary tract symptoms 10/30/2013  . Type II or unspecified type diabetes mellitus without mention of complication, not stated as uncontrolled 10/20/2012  . Onychomycosis 10/05/2012  . Chest pain, unspecified 10/21/2011  . Hypertension   . Hyperlipidemia   . Hx of cardiac cath   . Coronary atherosclerosis of native coronary artery   . Family history of malignant neoplasm of prostate 10/19/2011    Past Surgical History:  Procedure Laterality Date  . bladder cancer    . CARDIAC CATHETERIZATION  2013  . cataracts  removed    . CHOLECYSTECTOMY    . COLONOSCOPY    . CORONARY ANGIOPLASTY     2 stents from 2009  . CORONARY ARTERY BYPASS GRAFT  10/29/2011   Procedure: CORONARY ARTERY BYPASS GRAFTING (CABG);  Surgeon: Ivin Poot, MD;  Location: [facility];  Service: Open Heart Surgery;  Laterality: N/A;  . cyst removed from finger     right hand  . EYE SURGERY     bilateral cataracts  . HEMORRHOID SURGERY    . HERNIA REPAIR     x 2;inguinal  . KIDNEY STONE SURGERY    . LEFT HEART CATHETERIZATION WITH CORONARY ANGIOGRAM N/A 10/21/2011   Procedure: LEFT HEART CATHETERIZATION WITH CORONARY ANGIOGRAM;  Surgeon: Candee Furbish, MD;  Location: [facility] CATH LAB;  Service: Cardiovascular;  Laterality: N/A;  . rotator cuff surgery     right   . skin  cancer removed         Family History  Problem Relation Age of Onset  . Heart disease Mother   . Stroke Father   . Cancer Brother     Social History   Tobacco Use  . Smoking status: Former Smoker    Packs/day: 1.50    Years: 30.00    Pack years: 45.00    Types: Cigarettes    Quit date: 03/02/1984    Years since quitting: 35.0  . Smokeless tobacco: Never Used  Substance Use Topics  . Alcohol use: No    Alcohol/week: 0.0 standard drinks  . Drug use: No    Home Medications Prior to Admission medications   Medication Sig Start Date End Date Taking? Authorizing Provider  aspirin EC 81 MG tablet Take 1 tablet (81 mg total) by mouth daily. 04/22/17   Jerline Pain, MD  b complex vitamins capsule Take 1 capsule by mouth daily.    [provider]  calcium carbonate (TUMS - DOSED IN MG ELEMENTAL CALCIUM) 500 MG chewable tablet Chew 2 tablets by mouth daily as needed. For indigestion.     [provider]  Coenzyme Q10-Vitamin E 100-1 MG-UNT/5ML SYRP Take 100 mg by mouth daily. Take 2 tablespoons (100MG ) Daily    [provider]  fish oil-omega-3 fatty acids 1000 MG capsule Take 1,200 mg by mouth daily. 4 1200 tablets daily    [provider]  fluticasone (FLONASE) 50 MCG/ACT nasal spray Place 1 spray into both nostrils daily as needed for allergies or rhinitis.    [provider]  LORazepam (ATIVAN) 0.5 MG tablet TK 1/2 TO 1 T PO QD PRN 01/30/16   [provider]  losartan-hydrochlorothiazide (HYZAAR) 100-25 MG tablet TAKE 1 TABLET BY MOUTH EVERY DAY 09/05/18   Jerline Pain, MD  metFORMIN (GLUCOPHAGE-XR) 500 MG 24 hr tablet TAKE 1 TABLET BY MOUTH TWICE DAILY 02/27/19   Elayne Snare, MD  metoprolol tartrate (LOPRESSOR) 25 MG tablet TAKE 1 TABLET BY MOUTH TWICE DAILY 09/05/18   Jerline Pain, MD  rosuvastatin (CRESTOR) 20 MG tablet Take 20 mg by mouth See admin instructions. Take 1 tablet by mouth three times weekly.    [provider]  traMADol (ULTRAM) 50 MG tablet TAKE 1 TABLET BY MOUTH EVERY 6 HOURS AS NEEDED 09/01/17   Jessy Oto, MD  valACYclovir (VALTREX) 1000 MG tablet TK 1 T PO TID FOR 10 DAYS 02/01/18   [provider]    Allergies    Crestor [rosuvastatin], Sertraline, and Zocor [simvastatin]  Review of Systems  Review of Systems  Cardiovascular: Positive for near-syncope.  All other systems reviewed and are negative.   Physical Exam Updated Vital Signs BP 119/79   Pulse 67   Temp 98.8 F (37.1 C) (Oral)   Resp (!) 21   SpO2 94%   Physical Exam Vitals and nursing note reviewed.  Constitutional:      Appearance: He is well-developed.  HENT:     Head: Normocephalic and atraumatic.  Cardiovascular:     Rate and Rhythm: Rhythm irregular.     Heart sounds: No murmur.  Pulmonary:     Effort: Pulmonary effort is normal. No respiratory distress.     Breath sounds: Normal breath sounds.  Abdominal:     Palpations: Abdomen is soft.     Tenderness: There is no abdominal tenderness. There is no guarding or rebound.  Musculoskeletal:        General: No swelling or tenderness.  Skin:    General: Skin is warm and dry.  Neurological:     Mental Status: He is alert and oriented to person, place, and time.  Psychiatric:        Behavior: Behavior normal.     ED Results / Procedures / Treatments   Labs (all labs ordered are listed, but only abnormal results are displayed) Labs Reviewed  BASIC METABOLIC PANEL - Abnormal; Notable for the following components:      Result Value   Sodium 134 (*)    Chloride 97 (*)    Glucose, Bld 140 (*)    BUN 26 (*)    Creatinine, Ser 1.64 (*)    GFR calc non Af Amer 37 (*)    GFR calc Af Amer 43 (*)    All other components within normal limits  CBC  TROPONIN I (HIGH SENSITIVITY)  TROPONIN I (HIGH SENSITIVITY)    EKG EKG Interpretation  Date/Time:  Saturday March 18 2019 18:12:17 EST Ventricular Rate:  68 PR Interval:    QRS  Duration: 90 QT Interval:  420 QTC Calculation: 446 R Axis:   37 Text Interpretation: Atrial fibrillation with a competing junctional pacemaker Nonspecific ST and T wave abnormality Abnormal ECG Confirmed by Quintella Reichert [phone]) on 03/18/2019 7:16:39 PM   Radiology DG Chest 2 View  Result Date: 03/18/2019 CLINICAL DATA:  86 year old male with shortness of breath. EXAM: CHEST - 2 VIEW COMPARISON:  Chest CT dated 10/29/2016. FINDINGS: No focal consolidation, pleural effusion or pneumothorax. The cardiac silhouette is within normal limits. Median sternotomy wires and CABG vascular clips. Atherosclerotic calcification of the aorta. The aorta is tortuous. No acute osseous pathology. IMPRESSION: No active cardiopulmonary disease. Electronically Signed   By: Anner Crete M.D.   On: 03/18/2019 19:04    Procedures Procedures (including critical care time)  Medications Ordered in ED Medications  sodium chloride flush (NS) 0.9 % injection 3 mL (has no administration in time range)    ED Course  I have reviewed the triage vital signs and the nursing notes.  Pertinent labs & imaging results that were available during my care of the patient were reviewed by me and considered in my medical decision making (see chart for details).    MDM Rules/Calculators/A&P                      Patient with recent diagnosis of COVID-19 infection here for evaluation of palpitations with near syncopal event. He is non-toxic appearing on evaluation with no acute distress. He is a new onset  atrial fibrillation with occasional bradycardia spells to the low 40s. He appears asymptomatic during these episodes in the emergency department. Given his bradycardia with new onset atrial fibrillation in the setting of near syncope hospitals consulted for observation admission. Patient updated findings of studies recommendation for admission and he is in agreement with treatment plan.  Atley E Habib was evaluated in  Emergency Department on 03/18/2019 for the symptoms described in the history of present illness. He was evaluated in the context of the global COVID-19 pandemic, which necessitated consideration that the patient might be at risk for infection with the SARS-CoV-2 virus that causes COVID-19. Institutional protocols and algorithms that pertain to the evaluation of patients at risk for COVID-19 are in a state of rapid change based on information released by regulatory bodies including the CDC and federal and state organizations. These policies and algorithms were followed during the patient's care in the ED.  Final Clinical Impression(s) / ED Diagnoses Final diagnoses:  Near syncope  Atrial fibrillation, new onset [facility])    Rx / DC Orders ED Discharge Orders    None       Quintella Reichert, MD 03/18/19 2347

## 2019-03-18 NOTE — ED Notes (Signed)
Patient states he tested positive for covid a week ago at [facility] on [address].

## 2019-03-19 ENCOUNTER — Observation Stay (HOSPITAL_COMMUNITY): Payer: Medicare Other

## 2019-03-19 ENCOUNTER — Encounter (HOSPITAL_COMMUNITY): Payer: Self-pay | Admitting: Internal Medicine

## 2019-03-19 DIAGNOSIS — G4733 Obstructive sleep apnea (adult) (pediatric): Secondary | ICD-10-CM | POA: Diagnosis present

## 2019-03-19 DIAGNOSIS — U071 COVID-19: Secondary | ICD-10-CM | POA: Diagnosis not present

## 2019-03-19 DIAGNOSIS — R001 Bradycardia, unspecified: Secondary | ICD-10-CM | POA: Diagnosis present

## 2019-03-19 DIAGNOSIS — Z85828 Personal history of other malignant neoplasm of skin: Secondary | ICD-10-CM | POA: Diagnosis not present

## 2019-03-19 DIAGNOSIS — I361 Nonrheumatic tricuspid (valve) insufficiency: Secondary | ICD-10-CM

## 2019-03-19 DIAGNOSIS — I129 Hypertensive chronic kidney disease with stage 1 through stage 4 chronic kidney disease, or unspecified chronic kidney disease: Secondary | ICD-10-CM | POA: Diagnosis present

## 2019-03-19 DIAGNOSIS — E669 Obesity, unspecified: Secondary | ICD-10-CM | POA: Diagnosis present

## 2019-03-19 DIAGNOSIS — E785 Hyperlipidemia, unspecified: Secondary | ICD-10-CM | POA: Diagnosis present

## 2019-03-19 DIAGNOSIS — R0602 Shortness of breath: Secondary | ICD-10-CM | POA: Diagnosis present

## 2019-03-19 DIAGNOSIS — F329 Major depressive disorder, single episode, unspecified: Secondary | ICD-10-CM | POA: Diagnosis present

## 2019-03-19 DIAGNOSIS — Z823 Family history of stroke: Secondary | ICD-10-CM | POA: Diagnosis not present

## 2019-03-19 DIAGNOSIS — Z8551 Personal history of malignant neoplasm of bladder: Secondary | ICD-10-CM | POA: Diagnosis not present

## 2019-03-19 DIAGNOSIS — F419 Anxiety disorder, unspecified: Secondary | ICD-10-CM | POA: Diagnosis present

## 2019-03-19 DIAGNOSIS — Z8616 Personal history of COVID-19: Secondary | ICD-10-CM | POA: Diagnosis not present

## 2019-03-19 DIAGNOSIS — E1136 Type 2 diabetes mellitus with diabetic cataract: Secondary | ICD-10-CM | POA: Diagnosis present

## 2019-03-19 DIAGNOSIS — E1122 Type 2 diabetes mellitus with diabetic chronic kidney disease: Secondary | ICD-10-CM | POA: Diagnosis present

## 2019-03-19 DIAGNOSIS — I6523 Occlusion and stenosis of bilateral carotid arteries: Secondary | ICD-10-CM | POA: Diagnosis present

## 2019-03-19 DIAGNOSIS — I4891 Unspecified atrial fibrillation: Secondary | ICD-10-CM | POA: Diagnosis present

## 2019-03-19 DIAGNOSIS — I340 Nonrheumatic mitral (valve) insufficiency: Secondary | ICD-10-CM | POA: Diagnosis not present

## 2019-03-19 DIAGNOSIS — I480 Paroxysmal atrial fibrillation: Secondary | ICD-10-CM | POA: Diagnosis present

## 2019-03-19 DIAGNOSIS — I2510 Atherosclerotic heart disease of native coronary artery without angina pectoris: Secondary | ICD-10-CM | POA: Diagnosis present

## 2019-03-19 DIAGNOSIS — Z87891 Personal history of nicotine dependence: Secondary | ICD-10-CM | POA: Diagnosis not present

## 2019-03-19 DIAGNOSIS — R55 Syncope and collapse: Secondary | ICD-10-CM | POA: Diagnosis present

## 2019-03-19 DIAGNOSIS — E7800 Pure hypercholesterolemia, unspecified: Secondary | ICD-10-CM | POA: Diagnosis not present

## 2019-03-19 DIAGNOSIS — D696 Thrombocytopenia, unspecified: Secondary | ICD-10-CM | POA: Diagnosis present

## 2019-03-19 DIAGNOSIS — Z951 Presence of aortocoronary bypass graft: Secondary | ICD-10-CM | POA: Diagnosis not present

## 2019-03-19 DIAGNOSIS — Z955 Presence of coronary angioplasty implant and graft: Secondary | ICD-10-CM | POA: Diagnosis not present

## 2019-03-19 DIAGNOSIS — I10 Essential (primary) hypertension: Secondary | ICD-10-CM | POA: Diagnosis not present

## 2019-03-19 DIAGNOSIS — N1831 Chronic kidney disease, stage 3a: Secondary | ICD-10-CM

## 2019-03-19 DIAGNOSIS — R0600 Dyspnea, unspecified: Secondary | ICD-10-CM

## 2019-03-19 DIAGNOSIS — Z6832 Body mass index (BMI) 32.0-32.9, adult: Secondary | ICD-10-CM | POA: Diagnosis not present

## 2019-03-19 DIAGNOSIS — Z8249 Family history of ischemic heart disease and other diseases of the circulatory system: Secondary | ICD-10-CM | POA: Diagnosis not present

## 2019-03-19 DIAGNOSIS — N1832 Chronic kidney disease, stage 3b: Secondary | ICD-10-CM | POA: Diagnosis not present

## 2019-03-19 LAB — CBC WITH DIFFERENTIAL/PLATELET
Abs Immature Granulocytes: 0.01 10*3/uL (ref 0.00–0.07)
Basophils Absolute: 0 10*3/uL (ref 0.0–0.1)
Basophils Relative: 0 %
Eosinophils Absolute: 0.1 10*3/uL (ref 0.0–0.5)
Eosinophils Relative: 1 %
HCT: 43 % (ref 39.0–52.0)
Hemoglobin: 14.8 g/dL (ref 13.0–17.0)
Immature Granulocytes: 0 %
Lymphocytes Relative: 26 %
Lymphs Abs: 1.5 10*3/uL (ref 0.7–4.0)
MCH: 32 pg (ref 26.0–34.0)
MCHC: 34.4 g/dL (ref 30.0–36.0)
MCV: 93.1 fL (ref 80.0–100.0)
Monocytes Absolute: 0.7 10*3/uL (ref 0.1–1.0)
Monocytes Relative: 11 %
Neutro Abs: 3.6 10*3/uL (ref 1.7–7.7)
Neutrophils Relative %: 62 %
Platelets: 135 10*3/uL — ABNORMAL LOW (ref 150–400)
RBC: 4.62 MIL/uL (ref 4.22–5.81)
RDW: 12.2 % (ref 11.5–15.5)
WBC: 5.8 10*3/uL (ref 4.0–10.5)
nRBC: 0 % (ref 0.0–0.2)

## 2019-03-19 LAB — COMPREHENSIVE METABOLIC PANEL
ALT: 21 U/L (ref 0–44)
AST: 24 U/L (ref 15–41)
Albumin: 3.2 g/dL — ABNORMAL LOW (ref 3.5–5.0)
Alkaline Phosphatase: 45 U/L (ref 38–126)
Anion gap: 12 (ref 5–15)
BUN: 23 mg/dL (ref 8–23)
CO2: 27 mmol/L (ref 22–32)
Calcium: 8.9 mg/dL (ref 8.9–10.3)
Chloride: 100 mmol/L (ref 98–111)
Creatinine, Ser: 1.41 mg/dL — ABNORMAL HIGH (ref 0.61–1.24)
GFR calc Af Amer: 52 mL/min — ABNORMAL LOW (ref >60)
GFR calc non Af Amer: 45 mL/min — ABNORMAL LOW (ref >60)
Glucose, Bld: 114 mg/dL — ABNORMAL HIGH (ref 70–99)
Potassium: 3.5 mmol/L (ref 3.5–5.1)
Sodium: 139 mmol/L (ref 135–145)
Total Bilirubin: 1.1 mg/dL (ref 0.3–1.2)
Total Protein: 6 g/dL — ABNORMAL LOW (ref 6.5–8.1)

## 2019-03-19 LAB — ECHOCARDIOGRAM COMPLETE
Height: 63 in
Weight: 2912 oz

## 2019-03-19 LAB — FIBRINOGEN: Fibrinogen: 465 mg/dL (ref 210–475)

## 2019-03-19 LAB — GLUCOSE, CAPILLARY
Glucose-Capillary: 115 mg/dL — ABNORMAL HIGH (ref 70–99)
Glucose-Capillary: 151 mg/dL — ABNORMAL HIGH (ref 70–99)
Glucose-Capillary: 135 mg/dL — ABNORMAL HIGH (ref 70–99)
Glucose-Capillary: 171 mg/dL — ABNORMAL HIGH (ref 70–99)
Glucose-Capillary: 177 mg/dL — ABNORMAL HIGH (ref 70–99)

## 2019-03-19 LAB — BRAIN NATRIURETIC PEPTIDE: B Natriuretic Peptide: 786.5 pg/mL — ABNORMAL HIGH (ref 0.0–100.0)

## 2019-03-19 LAB — PROCALCITONIN: Procalcitonin: <0.1 ng/mL

## 2019-03-19 LAB — FERRITIN: Ferritin: 177 ng/mL (ref 24–336)

## 2019-03-19 LAB — HEPATITIS B SURFACE ANTIGEN: Hepatitis B Surface Ag: NONREACTIVE

## 2019-03-19 LAB — TROPONIN I (HIGH SENSITIVITY): Troponin I (High Sensitivity): 16 ng/L (ref <18)

## 2019-03-19 LAB — D-DIMER, QUANTITATIVE: D-Dimer, Quant: 0.82 ug/mL-FEU — ABNORMAL HIGH (ref 0.00–0.50)

## 2019-03-19 LAB — C-REACTIVE PROTEIN: CRP: 0.7 mg/dL (ref <1.0)

## 2019-03-19 LAB — TSH: TSH: 2.224 u[IU]/mL (ref 0.350–4.500)

## 2019-03-19 MED ORDER — HYDROCHLOROTHIAZIDE 25 MG PO TABS
25.0000 mg | ORAL_TABLET | Freq: Every day | ORAL | Status: DC
  Administered 2019-03-19 – 2019-03-20 (×2): 25 mg via ORAL
  Filled 2019-03-19 (×2): qty 1

## 2019-03-19 MED ORDER — LOSARTAN POTASSIUM-HCTZ 100-25 MG PO TABS: 1.0000 | ORAL_TABLET | Freq: Every day | ORAL | Status: DC

## 2019-03-19 MED ORDER — OMEGA-3-ACID ETHYL ESTERS 1 G PO CAPS
1000.0000 mg | ORAL_CAPSULE | Freq: Every day | ORAL | Status: DC
  Administered 2019-03-19 – 2019-03-20 (×2): 1000 mg via ORAL
  Filled 2019-03-19 (×2): qty 1

## 2019-03-19 MED ORDER — ASPIRIN EC 81 MG PO TBEC
81.0000 mg | DELAYED_RELEASE_TABLET | Freq: Every day | ORAL | Status: DC
  Administered 2019-03-19: 81 mg via ORAL
  Filled 2019-03-19: qty 1

## 2019-03-19 MED ORDER — FLUTICASONE PROPIONATE 50 MCG/ACT NA SUSP: 1.0000 | Freq: Every day | NASAL | Status: DC | PRN

## 2019-03-19 MED ORDER — APIXABAN 5 MG PO TABS
5.0000 mg | ORAL_TABLET | Freq: Two times a day (BID) | ORAL | Status: DC
  Administered 2019-03-19 – 2019-03-20 (×2): 5 mg via ORAL
  Filled 2019-03-19 (×2): qty 1

## 2019-03-19 MED ORDER — TRAMADOL HCL 50 MG PO TABS: 50.0000 mg | ORAL_TABLET | Freq: Four times a day (QID) | ORAL | Status: DC | PRN

## 2019-03-19 MED ORDER — METFORMIN HCL ER 500 MG PO TB24
500.0000 mg | ORAL_TABLET | Freq: Two times a day (BID) | ORAL | Status: DC
  Filled 2019-03-19: qty 1

## 2019-03-19 MED ORDER — LORAZEPAM 0.5 MG PO TABS
0.2500 mg | ORAL_TABLET | Freq: Every day | ORAL | Status: DC | PRN
  Administered 2019-03-19: 0.5 mg via ORAL
  Filled 2019-03-19: qty 1

## 2019-03-19 MED ORDER — LOSARTAN POTASSIUM 50 MG PO TABS
100.0000 mg | ORAL_TABLET | Freq: Every day | ORAL | Status: DC
  Administered 2019-03-19 – 2019-03-20 (×2): 100 mg via ORAL
  Filled 2019-03-19 (×2): qty 2

## 2019-03-19 MED ORDER — METOPROLOL TARTRATE 25 MG PO TABS
25.0000 mg | ORAL_TABLET | Freq: Two times a day (BID) | ORAL | Status: DC
  Administered 2019-03-19 – 2019-03-20 (×3): 25 mg via ORAL
  Filled 2019-03-19 (×3): qty 1

## 2019-03-19 MED ORDER — VITAMIN E 45 MG (100 UNIT) PO CAPS
100.0000 [IU] | ORAL_CAPSULE | Freq: Every day | ORAL | Status: DC
  Administered 2019-03-19 – 2019-03-20 (×2): 100 [IU] via ORAL
  Filled 2019-03-19 (×2): qty 1

## 2019-03-19 MED ORDER — B COMPLEX-C PO TABS
1.0000 | ORAL_TABLET | Freq: Every day | ORAL | Status: DC
  Administered 2019-03-19 – 2019-03-20 (×2): 1 via ORAL
  Filled 2019-03-19 (×2): qty 1

## 2019-03-19 NOTE — Consult Note (Signed)
CARDIOLOGY CONSULT NOTE       Patient ID: Cody Thomas MRN: [id number] DOB/AGE: [birth year] 86 y.o.  Admit date: 03/18/2019 Referring Physician: Maudie Mercury Primary Physician: Leighton Ruff, MD Primary Cardiologist: Marlou Porch Reason for Consultation: Pre syncope PAF  Principal Problem:   Atrial fibrillation [facility]) Active Problems:   Hypertension   Hyperlipidemia   Coronary atherosclerosis of native coronary artery   CKD (chronic kidney disease) stage 3, GFR 30-59 ml/min   Near syncope   HPI:  86 y.o. functional white male. Admitted with new onset afib and pre syncope. He has history of CABG in 2013 with post op PAF but no recurrent arrhythmia or angina since. He has OSA and wears CPAP. Recent COVID infection Rx at home Wife also positive. S/P remdesivir 03/15/19 Over the last 36 hours. He has had 3-4 spells of brief black out followed by palpitations and dyspnea lasting minutes. No trauma or frank syncope no angina. He is active still does yard work and all ADLs No fever pleuritic pain Labs remarkable for D dimer 0.82 Hb 15.7 BNP 786 troponin 12->13 ECG a year ago SR rate in 60's with PR 300 msec. ECG on admission afib rate 68 non specific ST changes. Telemetry with no NSVT or long pauses CT head negative Not started on anticoagulation yet CXR NAD   This patients CHA2DS2-VASc Score and unadjusted Ischemic Stroke Rate (% per year) is equal to 4.8 % stroke rate/year from a score of 4  Above score calculated as 1 point each if present [CHF, HTN, DM, Vascular=MI/PAD/Aortic Plaque, Age if 65-74, or Male] Above score calculated as 2 points each if present [Age > 75, or Stroke/TIA/TE]   ROS All other systems reviewed and negative except as noted above  Past Medical History:  Diagnosis Date  . Anxiety   . Bunion 09/29/2011  . Cancer (HCC)    nose/Dr. Albertini;skin  . Chronic constipation   . Chronic kidney disease    stage 111  . Coronary atherosclerosis of native coronary artery      2009 LAD CIRC DES  . Depression    doesn't take any meds for this  . Diabetes mellitus    takes Januvia daily  . Dizziness   . Enlarged prostate    but doesn't require meds at present  . GERD (gastroesophageal reflux disease)    TUMS prn  . H/O hiatal hernia   . Headache(784.0)   . History of gout    doesn't require meds  . Hx of cardiac cath   . Hyperlipidemia    Crestor 3 x wk and Zetia daily  . Hypertension    takes Hyzaar daily  . Insomnia    doesn't require meds at present time  . Joint pain   . Onychomycosis 10/05/2012   x 10  . PONV (postoperative nausea and vomiting)    pt states extremely sick  . Shortness of breath    with exertion    Family History  Problem Relation Age of Onset  . Heart disease Mother   . Stroke Father   . Cancer Brother     Social History   Socioeconomic History  . Marital status: Married    Spouse name: Not on file  . Number of children: 2  . Years of education: HS  . Highest education level: Not on file  Occupational History  . Occupation: Retired  Tobacco Use  . Smoking status: Former Smoker    Packs/day: 1.50    Years: 30.00  Pack years: 45.00    Types: Cigarettes    Quit date: 03/02/1984    Years since quitting: 35.0  . Smokeless tobacco: Never Used  Substance and Sexual Activity  . Alcohol use: No    Alcohol/week: 0.0 standard drinks  . Drug use: No  . Sexual activity: Not Currently  Other Topics Concern  . Not on file  Social History Narrative   Lives at home with his wife and great-granddaughter.   Right-handed.   Occasional caffeine use.   Social Determinants of Health   Financial Resource Strain:   . Difficulty of Paying Living Expenses: Not on file  Food Insecurity:   . Worried About Charity fundraiser in the Last Year: Not on file  . Ran Out of Food in the Last Year: Not on file  Transportation Needs:   . Lack of Transportation (Medical): Not on file  . Lack of Transportation (Non-Medical): Not on  file  Physical Activity:   . Days of Exercise per Week: Not on file  . Minutes of Exercise per Session: Not on file  Stress:   . Feeling of Stress : Not on file  Social Connections:   . Frequency of Communication with Friends and Family: Not on file  . Frequency of Social Gatherings with Friends and Family: Not on file  . Attends Religious Services: Not on file  . Active Member of Clubs or Organizations: Not on file  . Attends Archivist Meetings: Not on file  . Marital Status: Not on file  Intimate Partner Violence:   . Fear of Current or Ex-Partner: Not on file  . Emotionally Abused: Not on file  . Physically Abused: Not on file  . Sexually Abused: Not on file    Past Surgical History:  Procedure Laterality Date  . bladder cancer    . CARDIAC CATHETERIZATION  2013  . cataracts removed    . CHOLECYSTECTOMY    . COLONOSCOPY    . CORONARY ANGIOPLASTY     2 stents from 2009  . CORONARY ARTERY BYPASS GRAFT  10/29/2011   Procedure: CORONARY ARTERY BYPASS GRAFTING (CABG);  Surgeon: Ivin Poot, MD;  Location: [facility];  Service: Open Heart Surgery;  Laterality: N/A;  . cyst removed from finger     right hand  . EYE SURGERY     bilateral cataracts  . HEMORRHOID SURGERY    . HERNIA REPAIR     x 2;inguinal  . KIDNEY STONE SURGERY    . LEFT HEART CATHETERIZATION WITH CORONARY ANGIOGRAM N/A 10/21/2011   Procedure: LEFT HEART CATHETERIZATION WITH CORONARY ANGIOGRAM;  Surgeon: Candee Furbish, MD;  Location: [facility] CATH LAB;  Service: Cardiovascular;  Laterality: N/A;  . rotator cuff surgery     right   . skin cancer removed        Current Facility-Administered Medications:  .  0.9 %  sodium chloride infusion, , Intravenous, Continuous, Jani Gravel, MD, Last Rate: 75 mL/hr at 03/19/19 0245, Rate Verify at 03/19/19 0245 .  acetaminophen (TYLENOL) tablet 650 mg, 650 mg, Oral, Q6H PRN, Jani Gravel, MD .  aspirin EC tablet 81 mg, 81 mg, Oral, Daily, Jani Gravel, MD .  B-complex with  vitamin C tablet 1 tablet, 1 tablet, Oral, Daily, Jani Gravel, MD .  fluticasone (FLONASE) 50 MCG/ACT nasal spray 1 spray, 1 spray, Each Nare, Daily PRN, Jani Gravel, MD .  losartan (COZAAR) tablet 100 mg, 100 mg, Oral, Daily **AND** hydrochlorothiazide (HYDRODIURIL) tablet 25 mg, 25  mg, Oral, Daily, Jani Gravel, MD .  insulin aspart (novoLOG) injection 0-5 Units, 0-5 Units, Subcutaneous, QHS, Jani Gravel, MD .  insulin aspart (novoLOG) injection 0-9 Units, 0-9 Units, Subcutaneous, TID WC, Jani Gravel, MD .  LORazepam (ATIVAN) tablet 0.25-0.5 mg, 0.25-0.5 mg, Oral, Daily PRN, Jani Gravel, MD .  metoprolol tartrate (LOPRESSOR) tablet 25 mg, 25 mg, Oral, BID, Jani Gravel, MD .  omega-3 acid ethyl esters (LOVAZA) capsule 1,000 mg, 1,000 mg, Oral, Daily, Jani Gravel, MD .  traMADol Veatrice Bourbon) tablet 50 mg, 50 mg, Oral, Q6H PRN, Jani Gravel, MD .  vitamin E capsule 100 Units, 100 Units, Oral, Daily, Jani Gravel, MD . aspirin EC  81 mg Oral Daily  . B-complex with vitamin C  1 tablet Oral Daily  . losartan  100 mg Oral Daily   And  . hydrochlorothiazide  25 mg Oral Daily  . insulin aspart  0-5 Units Subcutaneous QHS  . insulin aspart  0-9 Units Subcutaneous TID WC  . metoprolol tartrate  25 mg Oral BID  . omega-3 acid ethyl esters  1,000 mg Oral Daily  . vitamin E  100 Units Oral Daily   . sodium chloride 75 mL/hr at 03/19/19 0245    Physical Exam: Blood pressure 140/89, pulse 61, temperature 98.2 F (36.8 C), temperature source Oral, resp. rate 20, height 5\' 3"  (1.6 m), weight 82.6 kg, SpO2 91 %.   Affect appropriate Elderly white male  HEENT: normal Neck supple with no adenopathy JVP normal no bruits no thyromegaly Lungs clear with no wheezing and good diaphragmatic motion Heart:  S1/S2SEM  murmur, no rub, gallop or click PMI normal Abdomen: benighn, BS positve, no tenderness, no AAA no bruit.  No HSM or HJR Distal pulses intact with no bruits No edema Neuro non-focal Skin warm and dry No  muscular weakness   Labs:   Lab Results  Component Value Date   WBC 5.8 03/19/2019   HGB 14.8 03/19/2019   HCT 43.0 03/19/2019   MCV 93.1 03/19/2019   PLT 135 (L) 03/19/2019    Recent Labs  Lab 03/19/19 0345  NA 139  K 3.5  CL 100  CO2 27  BUN 23  CREATININE 1.41*  CALCIUM 8.9  PROT 6.0*  BILITOT 1.1  ALKPHOS 45  ALT 21  AST 24  GLUCOSE 114*   Lab Results  Component Value Date   CKTOTAL 90 02/24/2008   CKMB 2.7 02/24/2008   TROPONINI 0.01        NO INDICATION OF MYOCARDIAL INJURY. 02/24/2008    Lab Results  Component Value Date   CHOL 116 10/06/2018   CHOL 109 05/05/2018   CHOL 184 01/04/2018   Lab Results  Component Value Date   HDL 34 (A) 10/06/2018   HDL 38.10 (L) 05/05/2018   HDL 34.00 (L) 01/04/2018   Lab Results  Component Value Date   LDLCALC 51 10/06/2018   LDLCALC 49 05/05/2018   LDLCALC 121 (H) 01/04/2018   Lab Results  Component Value Date   TRIG 158 10/06/2018   TRIG 110.0 05/05/2018   TRIG 145.0 01/04/2018   Lab Results  Component Value Date   CHOLHDL 3 05/05/2018   CHOLHDL 5 01/04/2018   CHOLHDL 4 03/03/2016   No results found for: LDLDIRECT    Radiology: DG Chest 2 View  Result Date: 03/18/2019 CLINICAL DATA:  86 year old male with shortness of breath. EXAM: CHEST - 2 VIEW COMPARISON:  Chest CT dated 10/29/2016. FINDINGS: No focal consolidation, pleural  effusion or pneumothorax. The cardiac silhouette is within normal limits. Median sternotomy wires and CABG vascular clips. Atherosclerotic calcification of the aorta. The aorta is tortuous. No acute osseous pathology. IMPRESSION: No active cardiopulmonary disease. Electronically Signed   By: Anner Crete M.D.   On: 03/18/2019 19:04   CT HEAD WO CONTRAST  Result Date: 03/19/2019 CLINICAL DATA:  Syncope/presyncope. Cardiac cause suspected. EXAM: CT HEAD WITHOUT CONTRAST TECHNIQUE: Contiguous axial images were obtained from the base of the skull through the vertex without  intravenous contrast. COMPARISON:  CT head 09/12/2008 FINDINGS: Brain: Mild atrophy and white matter changes progressed over the last 10 years. Basal ganglia are intact. No acute or focal cortical abnormalities are present. No acute infarct, hemorrhage, or mass lesion is present. No significant extraaxial fluid collection is present. The ventricles are of normal size. The brainstem and cerebellum are within normal limits. Vascular: Extensive vascular calcifications are present. No hyperdense vessel is present. Skull: Insert normal skull No significant extracranial soft tissue lesion is present. Sinuses/Orbits: Mild mucosal thickening is present in the ethmoid air cells. The paranasal sinuses and mastoid air cells are otherwise clear. The globes and orbits are within normal limits. IMPRESSION: 1. Progressive atrophy and white matter disease. This likely reflects the sequela of chronic microvascular ischemia. 2. No acute intracranial abnormality. Electronically Signed   By: San Morelle M.D.   On: 03/19/2019 06:06    ECG: see HPI    ASSESSMENT AND PLAN:   1. PAF:  CHADVASC 4 start eliquis per pharmachy protocol Low dose metroprolol on admission. No long pauses will arrange outpatient monitor 30 day to r/o long pauses and f/u with Dr Marlou Porch Continue telemetry 24 more hours   2. Pre Syncope: etiology not clear. May be related to PAF. See above TTE ordered to make sure EF still normal and no evidence of myocarditis Has SEM on exam likely AV sclerosis   3. Dyspnea:  CXR NAD recent COVID infection D dimer only mildly elevated 0.82 Inflammatory markers ok Will leave it up to primary service if they want to do CTA/perfusion study to r/o PE but will be started on anticoagulation for PAF anyway  CXR NAD and sats ok   4. CABG: 2013 no angina troponin negative no acute ST changes on ECG   Consider d/c in am if telemetry ok and echo normal EF   Signed: Jenkins Rouge 03/19/2019, 8:58 AM

## 2019-03-19 NOTE — Progress Notes (Signed)
Echocardiogram 2D Echocardiogram has been performed.  Oneal Deputy Nashonda Limberg 03/19/2019, 3:10 PM

## 2019-03-19 NOTE — Progress Notes (Signed)
ANTICOAGULATION CONSULT NOTE - Initial Consult  Pharmacy Consult for apixaban Indication: atrial fibrillation  Allergies  Allergen Reactions  . Crestor [Rosuvastatin]     Nightmares in high dosage  . Sertraline Diarrhea  . Zocor [Simvastatin]     Nightmares in high dosage    Patient Measurements: Height: 5\' 3"  (160 cm) Weight: 182 lb (82.6 kg) IBW/kg (Calculated) : 56.9 Heparin Dosing Weight: 74.6 kg  Vital Signs: Temp: 98.9 F (37.2 C) (01/17 1357) Temp Source: Oral (01/17 1357) BP: 157/84 (01/17 1357) Pulse Rate: 64 (01/17 1357)  Labs: Recent Labs    03/18/19 1828 03/18/19 2112 03/19/19 0345  HGB 15.7  --  14.8  HCT 45.4  --  43.0  PLT 157  --  135*  CREATININE 1.64*  --  1.41*  TROPONINIHS 12 13 16     Estimated Creatinine Clearance: 35.7 mL/min (A) (by C-G formula based on SCr of 1.41 mg/dL (H)).   Medical History: Past Medical History:  Diagnosis Date  . Anxiety   . Bunion 09/29/2011  . Cancer (HCC)    nose/Dr. Albertini;skin  . Chronic constipation   . Chronic kidney disease    stage 111  . Coronary atherosclerosis of native coronary artery    2009 LAD CIRC DES  . Depression    doesn't take any meds for this  . Diabetes mellitus    takes Januvia daily  . Dizziness   . Enlarged prostate    but doesn't require meds at present  . GERD (gastroesophageal reflux disease)    TUMS prn  . H/O hiatal hernia   . Headache(784.0)   . History of gout    doesn't require meds  . Hx of cardiac cath   . Hyperlipidemia    Crestor 3 x wk and Zetia daily  . Hypertension    takes Hyzaar daily  . Insomnia    doesn't require meds at present time  . Joint pain   . Onychomycosis 10/05/2012   x 10  . PONV (postoperative nausea and vomiting)    pt states extremely sick  . Shortness of breath    with exertion    Medications:  Scheduled:  . B-complex with vitamin C  1 tablet Oral Daily  . losartan  100 mg Oral Daily   And  . hydrochlorothiazide  25 mg  Oral Daily  . insulin aspart  0-5 Units Subcutaneous QHS  . insulin aspart  0-9 Units Subcutaneous TID WC  . metoprolol tartrate  25 mg Oral BID  . omega-3 acid ethyl esters  1,000 mg Oral Daily  . vitamin E  100 Units Oral Daily    Assessment: 33 yom presenting with near syncope in setting of new onset Afib with associated bradycardia. CHADs-VASC score is 4. No AC PTA.   Hgb 14.8, plt 135 - no s/sx of bleeding. Given age>80, Scr<1.5, and wt>60 kg, will start full dose apixaban.   Goal of Therapy:  Monitor platelets by anticoagulation protocol: Yes   Plan:  Start apixaban 5 mg twice daily Monitor Scr closely, CBC, and for s/sx of bleeding.  Antonietta Jewel, PharmD, BCCCP Clinical Pharmacist  Phone: [phone]  Please check AMION for all [place] phone numbers After 10:00 PM, call [place] [phone] 03/19/2019,3:55 PM

## 2019-03-19 NOTE — Progress Notes (Signed)
Cody Thomas is a 86 y.o. male patient admitted from ED awake, alert - oriented  X 4 - no acute distress noted.  VSS - Blood pressure (!) 141/97, pulse 63, temperature 99 F (37.2 C), temperature source Oral, resp. rate 16, height 5\' 3"  (1.6 m), weight 82.6 kg, SpO2 92 %.    IV in place, occlusive dsg intact without redness.  Orientation to room, and floor completed with information packet given to patient. Admission INP armband ID verified with patient and in place.   SR up, fall assessment complete, with patient able to verbalize understanding of risk associated with falls, and verbalized understanding to call nsg before up out of bed.  Call light within reach, patient able to voice, and demonstrate understanding.  Skin, clean-dry- intact without evidence of bruising, or skin tears.   No evidence of skin break down noted on exam.     Will cont to eval and treat per MD orders.  Susan Moore, RN 03/19/2019 3:17 AM

## 2019-03-19 NOTE — Progress Notes (Addendum)
PROGRESS NOTE   Cody Thomas  [id number]    DOB: 1930-10-29    DOA: 03/18/2019  PCP: Leighton Ruff, MD   I have briefly reviewed patients previous medical records in [facility].  Chief Complaint:   Chief Complaint  Patient presents with  . heart fluttering  . Near Syncope    Brief Narrative:  86 year old male with extensive PMH including anxiety and depression, stage III CKD, CAD s/p CABG, s/p DES LAD, DM 2, GERD, gout, HLD, HTN, insomnia, OSA on CPAP, recent COVID-19 diagnosis on 03/10/2019 s/p remdesivir, presented to the ED on 1/16 due to palpitations and questionable near syncope versus falling asleep.  He was given a BP monitor and it read out arrhythmia 4 times on day of admission.  He thought he was having a panic attack and took half an Ativan and his palpitations and associated dyspnea resolved.  When he checked his heart rate it was in the 60s.  In the ED he was noted to have new onset atrial fibrillation with occasional bradycardic spells in the low 40s which were asymptomatic.  He was admitted due to possible near syncope in the context of new onset A. fib and associated bradycardia for further evaluation.  Cardiology consulted.   Assessment & Plan:  Principal Problem:   Atrial fibrillation ([place]) Active Problems:   Hypertension   Hyperlipidemia   Coronary atherosclerosis of native coronary artery   CKD (chronic kidney disease) stage 3, GFR 30-59 ml/min   Near syncope   PAF (paroxysmal atrial fibrillation) (HCC)   Paroxysmal atrial fibrillation He has history of postop PAF after CABG in 2013 but no recurrent arrhythmia since.  Cardiology was consulted.  Continue metoprolol 25 mg twice daily.  Controlled ventricular rate on telemetry and no pauses noted.  Cardiology recommends additional 24 hours of telemetry monitoring to ensure he does not have any tacky bradycardia issues as cause of his near syncope.  They will arrange outpatient 30-day monitor to rule  out long pauses and follow-up with Dr. Marlou Porch. CHA2DS2-VASc: 4.  As per cardiology and discussed with patient, started Eliquis per pharmacy.  Patient is still physically active and does yard work and all ADLs.  No fall history reported.  TSH normal.  Near syncope History is not very clear.  Patient states that historically he does not sleep well at night.  He was reportedly sitting on a chair when he had couple episodes where he feels like he might have slept off but not sure if he passed out.  These episodes lasted only couple seconds.  Continue telemetry to rule out arrhythmias or pauses.  Outpatient 30-day monitor.  Follow TTE results.  Requested carotid Dopplers.  Check orthostatic blood pressures.  Patient advised not to drive for 6 months.  CT head without acute findings.  COVID-19 infection First diagnosed on 1/8, s/p remdesivir.  Chest x-ray without acute findings.  No clear hypoxia noted.  Inflammatory markers unremarkable.  Procalcitonin negative.  Monitor and supportive care as needed.  No indication for repeating remdesivir or Decadron.  D-dimer mildly elevated, low index of suspicion for PE, patient will be on anticoagulation in any event.  Stage IIIa chronic kidney disease Baseline creatinine probably in the 1.4 range.  Creatinine at baseline.  Thrombocytopenia Appears chronic, mild and intermittent.  Stable.  Essential hypertension Mildly uncontrolled at times.  Continue Hyzaar and metoprolol.  CAD s/p CABG  No anginal symptoms.  Consider stopping aspirin since Eliquis being initiated.  Continue metoprolol and  statins.  DM type II with renal complications Hold Metformin.  Continue SSI and monitor CBGs.  OSA on CPAP Continue  Body mass index is 32.24 kg/m./Obesity   DVT prophylaxis: SCDs.  Initiated Eliquis. Code Status: Full Family Communication: None at bedside.  I called patient spouse via phone, updated care and answered questions. Disposition: Patient admitted with  paroxysmal A. fib and concern for near syncope.  Ongoing concern that his near syncope may have been related to cardiac rhythm issues i.e. A. fib or others and hence closely monitoring overnight on telemetry, ensuring LVEF and carotid Dopplers are okay prior to discharge home tomorrow.  Thereby changed from observation to inpatient status.   Consultants:   Cardiology  Procedures:   None  Antimicrobials:   None   Subjective:  Patient interviewed and examined along with RN in room.  Mild intermittent dry cough without dyspnea or chest pain.  No palpitations, dizziness or lightheadedness.  Was actually hopeful of going home.  He cannot be sure if he was actually intermittently dozing off in his chair at home or actually passed out.  Objective:   Vitals:   03/19/19 0252 03/19/19 0400 03/19/19 0800 03/19/19 1357  BP: (!) 141/97 140/89 (!) 141/85 (!) 157/84  Pulse: 63 61 64 64  Resp: 16 20 15 20   Temp: 99 F (37.2 C) 98.2 F (36.8 C) 98.4 F (36.9 C) 98.9 F (37.2 C)  TempSrc: Oral Oral Oral Oral  SpO2: 92% 91% 92% 96%  Weight: 82.6 kg     Height: 5\' 3"  (1.6 m)       General exam: Pleasant elderly male, moderately built and obese sitting up comfortably in chair this morning. Respiratory system: Clear to auscultation. Respiratory effort normal. Cardiovascular system: S1 & S2 heard, irregularly irregular. No JVD, murmurs, rubs, gallops or clicks. No pedal edema.  Telemetry personally reviewed: A. fib with ventricular rate in the 50s-60s.  6 beat NSVT. Gastrointestinal system: Abdomen is nondistended, soft and nontender. No organomegaly or masses felt. Normal bowel sounds heard. Central nervous system: Alert and oriented. No focal neurological deficits. Extremities: Symmetric 5 x 5 power. Skin: No rashes, lesions or ulcers Psychiatry: Judgement and insight appear normal. Mood & affect appropriate.     Data Reviewed:   I have personally reviewed following labs and imaging  studies   CBC: Recent Labs  Lab 03/18/19 1828 03/19/19 0345  WBC 6.1 5.8  NEUTROABS  --  3.6  HGB 15.7 14.8  HCT 45.4 43.0  MCV 95.2 93.1  PLT 157 135*    Basic Metabolic Panel: Recent Labs  Lab 03/18/19 1828 03/19/19 0345  NA 134* 139  K 3.7 3.5  CL 97* 100  CO2 26 27  GLUCOSE 140* 114*  BUN 26* 23  CREATININE 1.64* 1.41*  CALCIUM 8.9 8.9    Liver Function Tests: Recent Labs  Lab 03/19/19 0345  AST 24  ALT 21  ALKPHOS 45  BILITOT 1.1  PROT 6.0*  ALBUMIN 3.2*    CBG: Recent Labs  Lab 03/19/19 0432 03/19/19 0829 03/19/19 1307  GLUCAP 115* 151* 135*    Microbiology Studies:  No results found for this or any previous visit (from the past 240 hour(s)).   Radiology Studies:  DG Chest 2 View  Result Date: 03/18/2019 CLINICAL DATA:  86 year old male with shortness of breath. EXAM: CHEST - 2 VIEW COMPARISON:  Chest CT dated 10/29/2016. FINDINGS: No focal consolidation, pleural effusion or pneumothorax. The cardiac silhouette is within normal limits. Median  sternotomy wires and CABG vascular clips. Atherosclerotic calcification of the aorta. The aorta is tortuous. No acute osseous pathology. IMPRESSION: No active cardiopulmonary disease. Electronically Signed   By: Anner Crete M.D.   On: 03/18/2019 19:04   CT HEAD WO CONTRAST  Result Date: 03/19/2019 CLINICAL DATA:  Syncope/presyncope. Cardiac cause suspected. EXAM: CT HEAD WITHOUT CONTRAST TECHNIQUE: Contiguous axial images were obtained from the base of the skull through the vertex without intravenous contrast. COMPARISON:  CT head 09/12/2008 FINDINGS: Brain: Mild atrophy and white matter changes progressed over the last 10 years. Basal ganglia are intact. No acute or focal cortical abnormalities are present. No acute infarct, hemorrhage, or mass lesion is present. No significant extraaxial fluid collection is present. The ventricles are of normal size. The brainstem and cerebellum are within normal  limits. Vascular: Extensive vascular calcifications are present. No hyperdense vessel is present. Skull: Insert normal skull No significant extracranial soft tissue lesion is present. Sinuses/Orbits: Mild mucosal thickening is present in the ethmoid air cells. The paranasal sinuses and mastoid air cells are otherwise clear. The globes and orbits are within normal limits. IMPRESSION: 1. Progressive atrophy and white matter disease. This likely reflects the sequela of chronic microvascular ischemia. 2. No acute intracranial abnormality. Electronically Signed   By: San Morelle M.D.   On: 03/19/2019 06:06     Scheduled Meds:   . aspirin EC  81 mg Oral Daily  . B-complex with vitamin C  1 tablet Oral Daily  . losartan  100 mg Oral Daily   And  . hydrochlorothiazide  25 mg Oral Daily  . insulin aspart  0-5 Units Subcutaneous QHS  . insulin aspart  0-9 Units Subcutaneous TID WC  . metoprolol tartrate  25 mg Oral BID  . omega-3 acid ethyl esters  1,000 mg Oral Daily  . vitamin E  100 Units Oral Daily    Continuous Infusions:     LOS: 0 days     Vernell Leep, MD, [place], [facility]. [facility]    To contact the attending provider between 7A-7P or the covering provider during after hours 7P-7A, please log into the web site [email or web address] and access using universal [place] password for that web site. If you do not have the password, please call the hospital operator.  03/19/2019, 3:20 PM

## 2019-03-20 ENCOUNTER — Inpatient Hospital Stay (HOSPITAL_COMMUNITY): Payer: Medicare Other

## 2019-03-20 ENCOUNTER — Other Ambulatory Visit: Payer: Self-pay | Admitting: Cardiology

## 2019-03-20 DIAGNOSIS — E7800 Pure hypercholesterolemia, unspecified: Secondary | ICD-10-CM

## 2019-03-20 DIAGNOSIS — E1169 Type 2 diabetes mellitus with other specified complication: Secondary | ICD-10-CM

## 2019-03-20 DIAGNOSIS — I480 Paroxysmal atrial fibrillation: Principal | ICD-10-CM

## 2019-03-20 DIAGNOSIS — U071 COVID-19: Secondary | ICD-10-CM

## 2019-03-20 DIAGNOSIS — I10 Essential (primary) hypertension: Secondary | ICD-10-CM

## 2019-03-20 DIAGNOSIS — R55 Syncope and collapse: Secondary | ICD-10-CM

## 2019-03-20 DIAGNOSIS — E785 Hyperlipidemia, unspecified: Secondary | ICD-10-CM

## 2019-03-20 DIAGNOSIS — N1832 Chronic kidney disease, stage 3b: Secondary | ICD-10-CM

## 2019-03-20 LAB — COMPREHENSIVE METABOLIC PANEL
ALT: 21 U/L (ref 0–44)
AST: 28 U/L (ref 15–41)
Albumin: 3.4 g/dL — ABNORMAL LOW (ref 3.5–5.0)
Alkaline Phosphatase: 46 U/L (ref 38–126)
Anion gap: 13 (ref 5–15)
BUN: 15 mg/dL (ref 8–23)
CO2: 26 mmol/L (ref 22–32)
Calcium: 9.3 mg/dL (ref 8.9–10.3)
Chloride: 98 mmol/L (ref 98–111)
Creatinine, Ser: 1.3 mg/dL — ABNORMAL HIGH (ref 0.61–1.24)
GFR calc Af Amer: 57 mL/min — ABNORMAL LOW (ref >60)
GFR calc non Af Amer: 49 mL/min — ABNORMAL LOW (ref >60)
Glucose, Bld: 153 mg/dL — ABNORMAL HIGH (ref 70–99)
Potassium: 3.5 mmol/L (ref 3.5–5.1)
Sodium: 137 mmol/L (ref 135–145)
Total Bilirubin: 1.4 mg/dL — ABNORMAL HIGH (ref 0.3–1.2)
Total Protein: 6.6 g/dL (ref 6.5–8.1)

## 2019-03-20 LAB — CBC WITH DIFFERENTIAL/PLATELET
Abs Immature Granulocytes: 0.03 10*3/uL (ref 0.00–0.07)
Basophils Absolute: 0 10*3/uL (ref 0.0–0.1)
Basophils Relative: 0 %
Eosinophils Absolute: 0 10*3/uL (ref 0.0–0.5)
Eosinophils Relative: 1 %
HCT: 43.9 % (ref 39.0–52.0)
Hemoglobin: 15.2 g/dL (ref 13.0–17.0)
Immature Granulocytes: 0 %
Lymphocytes Relative: 18 %
Lymphs Abs: 1.3 10*3/uL (ref 0.7–4.0)
MCH: 32.2 pg (ref 26.0–34.0)
MCHC: 34.6 g/dL (ref 30.0–36.0)
MCV: 93 fL (ref 80.0–100.0)
Monocytes Absolute: 0.7 10*3/uL (ref 0.1–1.0)
Monocytes Relative: 10 %
Neutro Abs: 5 10*3/uL (ref 1.7–7.7)
Neutrophils Relative %: 71 %
Platelets: 169 10*3/uL (ref 150–400)
RBC: 4.72 MIL/uL (ref 4.22–5.81)
RDW: 12.1 % (ref 11.5–15.5)
WBC: 7.1 10*3/uL (ref 4.0–10.5)
nRBC: 0 % (ref 0.0–0.2)

## 2019-03-20 LAB — GLUCOSE, CAPILLARY
Glucose-Capillary: 137 mg/dL — ABNORMAL HIGH (ref 70–99)
Glucose-Capillary: 153 mg/dL — ABNORMAL HIGH (ref 70–99)
Glucose-Capillary: 182 mg/dL — ABNORMAL HIGH (ref 70–99)

## 2019-03-20 MED ORDER — APIXABAN 5 MG PO TABS: 5.0000 mg | ORAL_TABLET | Freq: Two times a day (BID) | ORAL | 0 refills | Status: DC

## 2019-03-20 NOTE — Discharge Summary (Signed)
Physician Discharge Summary  Cody Thomas [id number] DOB: 1930/04/17  PCP: Leighton Ruff, MD  Admitted from: Home Discharged to: Home  Admit date: 03/18/2019 Discharge date: 03/20/2019  Recommendations for Outpatient Follow-up:   Follow-up Information    Jerline Pain, MD Follow up.   Specialty: Cardiology Why: Cardiology hospital follow-up with Dr. Marlou Porch' nurse practitioner, Cecilie Kicks, on 04/03/2019 at 3:45 PM.  Please arrive 15 minutes early for check-in. Contact information: [id number] [address] [phone]        [facility] Follow up.   Specialty: Cardiology Why: You will be called by our office to arrange for an outpatient heart monitor.  This will be worn for 30 days and then returned. Contact information: [address] [place]       Leighton Ruff, MD. Schedule an appointment as soon as possible for a visit in 1 week(s).   Specialty: Family Medicine Why: To be seen with repeat labs (CBC & CMP). Contact information: [place] [address] [phone]            Home Health: None Equipment/Devices: None  Discharge Condition: Improved and stable CODE STATUS: Full Diet recommendation: Heart healthy and diabetic diet.  Discharge Diagnoses:  Principal Problem:   Atrial fibrillation ([place]) Active Problems:   Hypertension   Hyperlipidemia   Coronary atherosclerosis of native coronary artery   CKD (chronic kidney disease) stage 3, GFR 30-59 ml/min   Near syncope   PAF (paroxysmal atrial fibrillation) (HCC)   Brief Summary: 86 year old male with extensive PMH including anxiety and depression, stage III CKD, CAD s/p CABG, s/p DES LAD, DM 2, GERD, gout, HLD, HTN, insomnia, OSA on CPAP, recent COVID-19 diagnosis on 03/10/2019 s/p remdesivir, presented to the ED on 1/16 due to palpitations and questionable near syncope  versus falling asleep.  He was given a BP monitor and it read out arrhythmia 4 times on day of admission.  He thought he was having a panic attack and took half an Ativan and his palpitations and associated dyspnea resolved.  When he checked his heart rate it was in the 60s.  In the ED he was noted to have new onset atrial fibrillation with occasional bradycardic spells in the low 40s which were asymptomatic.  He was admitted due to possible near syncope in the context of new onset A. fib and associated bradycardia for further evaluation.  Cardiology consulted.   Assessment & Plan:   Paroxysmal atrial fibrillation He has history of postop PAF after CABG in 2013 but no recurrent arrhythmia since.  Cardiology was consulted.  Continued metoprolol 25 mg twice daily.  Controlled ventricular rate on telemetry and no pauses or significant bradycardia noted. CHA2DS2-VASc: 5.  As per cardiology and discussed with patient, started Eliquis per pharmacy.  Patient is still physically active and does yard work and all ADLs.  No fall history reported.  TSH normal.  Cardiology follow-up appreciated and I discussed with Dr. Tamala Julian.  Agrees with discontinuing aspirin since Eliquis has been initiated to reduce bleeding risk.  They have set up a 30-day monitor and outpatient follow-up.  Near syncope History is not very clear.  Patient states that historically he does not sleep well at night.  He was reportedly sitting on a chair when he had couple episodes where he feels like he might have slept off but not sure if he passed out.  These episodes  MOUTH EVERY DAY   metFORMIN 500 MG 24 hr tablet Commonly known as: GLUCOPHAGE-XR TAKE 1 TABLET BY MOUTH TWICE DAILY   metoprolol tartrate 25 MG tablet Commonly known as: LOPRESSOR TAKE 1 TABLET BY MOUTH TWICE DAILY   rosuvastatin 20 MG tablet Commonly known as: CRESTOR Take 20 mg by mouth See admin instructions. Take 1 tablet by mouth three times weekly.   traMADol 50 MG tablet Commonly known as: ULTRAM TAKE 1 TABLET BY MOUTH EVERY 6 HOURS AS NEEDED What changed: reasons to take this      Allergies  Allergen Reactions  . Crestor [Rosuvastatin]     Nightmares in high dosage  . Sertraline Diarrhea  . Zocor [Simvastatin]     Nightmares in high dosage      Procedures/Studies: DG Chest 2 View  Result Date: 03/18/2019 CLINICAL DATA:  86 year old male with shortness of breath. EXAM: CHEST - 2 VIEW COMPARISON:  Chest CT dated 10/29/2016. FINDINGS: No focal consolidation, pleural effusion or pneumothorax. The cardiac  silhouette is within normal limits. Median sternotomy wires and CABG vascular clips. Atherosclerotic calcification of the aorta. The aorta is tortuous. No acute osseous pathology. IMPRESSION: No active cardiopulmonary disease. Electronically Signed   By: Anner Crete M.D.   On: 03/18/2019 19:04   CT HEAD WO CONTRAST  Result Date: 03/19/2019 CLINICAL DATA:  Syncope/presyncope. Cardiac cause suspected. EXAM: CT HEAD WITHOUT CONTRAST TECHNIQUE: Contiguous axial images were obtained from the base of the skull through the vertex without intravenous contrast. COMPARISON:  CT head 09/12/2008 FINDINGS: Brain: Mild atrophy and white matter changes progressed over the last 10 years. Basal ganglia are intact. No acute or focal cortical abnormalities are present. No acute infarct, hemorrhage, or mass lesion is present. No significant extraaxial fluid collection is present. The ventricles are of normal size. The brainstem and cerebellum are within normal limits. Vascular: Extensive vascular calcifications are present. No hyperdense vessel is present. Skull: Insert normal skull No significant extracranial soft tissue lesion is present. Sinuses/Orbits: Mild mucosal thickening is present in the ethmoid air cells. The paranasal sinuses and mastoid air cells are otherwise clear. The globes and orbits are within normal limits. IMPRESSION: 1. Progressive atrophy and white matter disease. This likely reflects the sequela of chronic microvascular ischemia. 2. No acute intracranial abnormality. Electronically Signed   By: San Morelle M.D.   On: 03/19/2019 06:06   ECHOCARDIOGRAM COMPLETE  Result Date: 03/19/2019   ECHOCARDIOGRAM REPORT   Patient Name:   Cody Thomas Date of Exam: 03/19/2019 Medical Rec #:  [id number]          Height:       63.0 in Accession #:    [id number]         Weight:       182.0 lb Date of Birth:  [birth year]           BSA:          1.86 m Patient Age:    35 years           BP:           157/84  mmHg Patient Gender: M                  HR:           66 bpm. Exam Location:  Inpatient Procedure: 2D Echo, Color Doppler and Cardiac Doppler Indications:    I48.91* Unspecified atrial fibrillation  History:        Patient has  Physician Discharge Summary  Cody Thomas [id number] DOB: 1930/04/17  PCP: Leighton Ruff, MD  Admitted from: Home Discharged to: Home  Admit date: 03/18/2019 Discharge date: 03/20/2019  Recommendations for Outpatient Follow-up:   Follow-up Information    Jerline Pain, MD Follow up.   Specialty: Cardiology Why: Cardiology hospital follow-up with Dr. Marlou Porch' nurse practitioner, Cecilie Kicks, on 04/03/2019 at 3:45 PM.  Please arrive 15 minutes early for check-in. Contact information: [id number] [place] [address] [phone]        [facility] Follow up.   Specialty: Cardiology Why: You will be called by our office to arrange for an outpatient heart monitor.  This will be worn for 30 days and then returned. Contact information: [address] [place]       Leighton Ruff, MD. Schedule an appointment as soon as possible for a visit in 1 week(s).   Specialty: Family Medicine Why: To be seen with repeat labs (CBC & CMP). Contact information: [place] [address] [phone]            Home Health: None Equipment/Devices: None  Discharge Condition: Improved and stable CODE STATUS: Full Diet recommendation: Heart healthy and diabetic diet.  Discharge Diagnoses:  Principal Problem:   Atrial fibrillation ([place]) Active Problems:   Hypertension   Hyperlipidemia   Coronary atherosclerosis of native coronary artery   CKD (chronic kidney disease) stage 3, GFR 30-59 ml/min   Near syncope   PAF (paroxysmal atrial fibrillation) (HCC)   Brief Summary: 86 year old male with extensive PMH including anxiety and depression, stage III CKD, CAD s/p CABG, s/p DES LAD, DM 2, GERD, gout, HLD, HTN, insomnia, OSA on CPAP, recent COVID-19 diagnosis on 03/10/2019 s/p remdesivir, presented to the ED on 1/16 due to palpitations and questionable near syncope  versus falling asleep.  He was given a BP monitor and it read out arrhythmia 4 times on day of admission.  He thought he was having a panic attack and took half an Ativan and his palpitations and associated dyspnea resolved.  When he checked his heart rate it was in the 60s.  In the ED he was noted to have new onset atrial fibrillation with occasional bradycardic spells in the low 40s which were asymptomatic.  He was admitted due to possible near syncope in the context of new onset A. fib and associated bradycardia for further evaluation.  Cardiology consulted.   Assessment & Plan:   Paroxysmal atrial fibrillation He has history of postop PAF after CABG in 2013 but no recurrent arrhythmia since.  Cardiology was consulted.  Continued metoprolol 25 mg twice daily.  Controlled ventricular rate on telemetry and no pauses or significant bradycardia noted. CHA2DS2-VASc: 5.  As per cardiology and discussed with patient, started Eliquis per pharmacy.  Patient is still physically active and does yard work and all ADLs.  No fall history reported.  TSH normal.  Cardiology follow-up appreciated and I discussed with Dr. Tamala Julian.  Agrees with discontinuing aspirin since Eliquis has been initiated to reduce bleeding risk.  They have set up a 30-day monitor and outpatient follow-up.  Near syncope History is not very clear.  Patient states that historically he does not sleep well at night.  He was reportedly sitting on a chair when he had couple episodes where he feels like he might have slept off but not sure if he passed out.  These episodes  Physician Discharge Summary  Cody Thomas [id number] DOB: 1930/04/17  PCP: Leighton Ruff, MD  Admitted from: Home Discharged to: Home  Admit date: 03/18/2019 Discharge date: 03/20/2019  Recommendations for Outpatient Follow-up:   Follow-up Information    Jerline Pain, MD Follow up.   Specialty: Cardiology Why: Cardiology hospital follow-up with Dr. Marlou Porch' nurse practitioner, Cecilie Kicks, on 04/03/2019 at 3:45 PM.  Please arrive 15 minutes early for check-in. Contact information: [id number] [address] [phone]        [facility] Follow up.   Specialty: Cardiology Why: You will be called by our office to arrange for an outpatient heart monitor.  This will be worn for 30 days and then returned. Contact information: [address] [place]       Leighton Ruff, MD. Schedule an appointment as soon as possible for a visit in 1 week(s).   Specialty: Family Medicine Why: To be seen with repeat labs (CBC & CMP). Contact information: [place] [address] [phone]            Home Health: None Equipment/Devices: None  Discharge Condition: Improved and stable CODE STATUS: Full Diet recommendation: Heart healthy and diabetic diet.  Discharge Diagnoses:  Principal Problem:   Atrial fibrillation ([place]) Active Problems:   Hypertension   Hyperlipidemia   Coronary atherosclerosis of native coronary artery   CKD (chronic kidney disease) stage 3, GFR 30-59 ml/min   Near syncope   PAF (paroxysmal atrial fibrillation) (HCC)   Brief Summary: 86 year old male with extensive PMH including anxiety and depression, stage III CKD, CAD s/p CABG, s/p DES LAD, DM 2, GERD, gout, HLD, HTN, insomnia, OSA on CPAP, recent COVID-19 diagnosis on 03/10/2019 s/p remdesivir, presented to the ED on 1/16 due to palpitations and questionable near syncope  versus falling asleep.  He was given a BP monitor and it read out arrhythmia 4 times on day of admission.  He thought he was having a panic attack and took half an Ativan and his palpitations and associated dyspnea resolved.  When he checked his heart rate it was in the 60s.  In the ED he was noted to have new onset atrial fibrillation with occasional bradycardic spells in the low 40s which were asymptomatic.  He was admitted due to possible near syncope in the context of new onset A. fib and associated bradycardia for further evaluation.  Cardiology consulted.   Assessment & Plan:   Paroxysmal atrial fibrillation He has history of postop PAF after CABG in 2013 but no recurrent arrhythmia since.  Cardiology was consulted.  Continued metoprolol 25 mg twice daily.  Controlled ventricular rate on telemetry and no pauses or significant bradycardia noted. CHA2DS2-VASc: 5.  As per cardiology and discussed with patient, started Eliquis per pharmacy.  Patient is still physically active and does yard work and all ADLs.  No fall history reported.  TSH normal.  Cardiology follow-up appreciated and I discussed with Dr. Tamala Julian.  Agrees with discontinuing aspirin since Eliquis has been initiated to reduce bleeding risk.  They have set up a 30-day monitor and outpatient follow-up.  Near syncope History is not very clear.  Patient states that historically he does not sleep well at night.  He was reportedly sitting on a chair when he had couple episodes where he feels like he might have slept off but not sure if he passed out.  These episodes  MOUTH EVERY DAY   metFORMIN 500 MG 24 hr tablet Commonly known as: GLUCOPHAGE-XR TAKE 1 TABLET BY MOUTH TWICE DAILY   metoprolol tartrate 25 MG tablet Commonly known as: LOPRESSOR TAKE 1 TABLET BY MOUTH TWICE DAILY   rosuvastatin 20 MG tablet Commonly known as: CRESTOR Take 20 mg by mouth See admin instructions. Take 1 tablet by mouth three times weekly.   traMADol 50 MG tablet Commonly known as: ULTRAM TAKE 1 TABLET BY MOUTH EVERY 6 HOURS AS NEEDED What changed: reasons to take this      Allergies  Allergen Reactions  . Crestor [Rosuvastatin]     Nightmares in high dosage  . Sertraline Diarrhea  . Zocor [Simvastatin]     Nightmares in high dosage      Procedures/Studies: DG Chest 2 View  Result Date: 03/18/2019 CLINICAL DATA:  86 year old male with shortness of breath. EXAM: CHEST - 2 VIEW COMPARISON:  Chest CT dated 10/29/2016. FINDINGS: No focal consolidation, pleural effusion or pneumothorax. The cardiac  silhouette is within normal limits. Median sternotomy wires and CABG vascular clips. Atherosclerotic calcification of the aorta. The aorta is tortuous. No acute osseous pathology. IMPRESSION: No active cardiopulmonary disease. Electronically Signed   By: Anner Crete M.D.   On: 03/18/2019 19:04   CT HEAD WO CONTRAST  Result Date: 03/19/2019 CLINICAL DATA:  Syncope/presyncope. Cardiac cause suspected. EXAM: CT HEAD WITHOUT CONTRAST TECHNIQUE: Contiguous axial images were obtained from the base of the skull through the vertex without intravenous contrast. COMPARISON:  CT head 09/12/2008 FINDINGS: Brain: Mild atrophy and white matter changes progressed over the last 10 years. Basal ganglia are intact. No acute or focal cortical abnormalities are present. No acute infarct, hemorrhage, or mass lesion is present. No significant extraaxial fluid collection is present. The ventricles are of normal size. The brainstem and cerebellum are within normal limits. Vascular: Extensive vascular calcifications are present. No hyperdense vessel is present. Skull: Insert normal skull No significant extracranial soft tissue lesion is present. Sinuses/Orbits: Mild mucosal thickening is present in the ethmoid air cells. The paranasal sinuses and mastoid air cells are otherwise clear. The globes and orbits are within normal limits. IMPRESSION: 1. Progressive atrophy and white matter disease. This likely reflects the sequela of chronic microvascular ischemia. 2. No acute intracranial abnormality. Electronically Signed   By: San Morelle M.D.   On: 03/19/2019 06:06   ECHOCARDIOGRAM COMPLETE  Result Date: 03/19/2019   ECHOCARDIOGRAM REPORT   Patient Name:   Cody Thomas Date of Exam: 03/19/2019 Medical Rec #:  [id number]          Height:       63.0 in Accession #:    [id number]         Weight:       182.0 lb Date of Birth:  [birth year]           BSA:          1.86 m Patient Age:    35 years           BP:           157/84  mmHg Patient Gender: M                  HR:           66 bpm. Exam Location:  Inpatient Procedure: 2D Echo, Color Doppler and Cardiac Doppler Indications:    I48.91* Unspecified atrial fibrillation  History:        Patient has  MOUTH EVERY DAY   metFORMIN 500 MG 24 hr tablet Commonly known as: GLUCOPHAGE-XR TAKE 1 TABLET BY MOUTH TWICE DAILY   metoprolol tartrate 25 MG tablet Commonly known as: LOPRESSOR TAKE 1 TABLET BY MOUTH TWICE DAILY   rosuvastatin 20 MG tablet Commonly known as: CRESTOR Take 20 mg by mouth See admin instructions. Take 1 tablet by mouth three times weekly.   traMADol 50 MG tablet Commonly known as: ULTRAM TAKE 1 TABLET BY MOUTH EVERY 6 HOURS AS NEEDED What changed: reasons to take this      Allergies  Allergen Reactions  . Crestor [Rosuvastatin]     Nightmares in high dosage  . Sertraline Diarrhea  . Zocor [Simvastatin]     Nightmares in high dosage      Procedures/Studies: DG Chest 2 View  Result Date: 03/18/2019 CLINICAL DATA:  86 year old male with shortness of breath. EXAM: CHEST - 2 VIEW COMPARISON:  Chest CT dated 10/29/2016. FINDINGS: No focal consolidation, pleural effusion or pneumothorax. The cardiac  silhouette is within normal limits. Median sternotomy wires and CABG vascular clips. Atherosclerotic calcification of the aorta. The aorta is tortuous. No acute osseous pathology. IMPRESSION: No active cardiopulmonary disease. Electronically Signed   By: Anner Crete M.D.   On: 03/18/2019 19:04   CT HEAD WO CONTRAST  Result Date: 03/19/2019 CLINICAL DATA:  Syncope/presyncope. Cardiac cause suspected. EXAM: CT HEAD WITHOUT CONTRAST TECHNIQUE: Contiguous axial images were obtained from the base of the skull through the vertex without intravenous contrast. COMPARISON:  CT head 09/12/2008 FINDINGS: Brain: Mild atrophy and white matter changes progressed over the last 10 years. Basal ganglia are intact. No acute or focal cortical abnormalities are present. No acute infarct, hemorrhage, or mass lesion is present. No significant extraaxial fluid collection is present. The ventricles are of normal size. The brainstem and cerebellum are within normal limits. Vascular: Extensive vascular calcifications are present. No hyperdense vessel is present. Skull: Insert normal skull No significant extracranial soft tissue lesion is present. Sinuses/Orbits: Mild mucosal thickening is present in the ethmoid air cells. The paranasal sinuses and mastoid air cells are otherwise clear. The globes and orbits are within normal limits. IMPRESSION: 1. Progressive atrophy and white matter disease. This likely reflects the sequela of chronic microvascular ischemia. 2. No acute intracranial abnormality. Electronically Signed   By: San Morelle M.D.   On: 03/19/2019 06:06   ECHOCARDIOGRAM COMPLETE  Result Date: 03/19/2019   ECHOCARDIOGRAM REPORT   Patient Name:   Cody Thomas Date of Exam: 03/19/2019 Medical Rec #:  [id number]          Height:       63.0 in Accession #:    [id number]         Weight:       182.0 lb Date of Birth:  [birth year]           BSA:          1.86 m Patient Age:    35 years           BP:           157/84  mmHg Patient Gender: M                  HR:           66 bpm. Exam Location:  Inpatient Procedure: 2D Echo, Color Doppler and Cardiac Doppler Indications:    I48.91* Unspecified atrial fibrillation  History:        Patient has  Physician Discharge Summary  Cody Thomas [id number] DOB: 1930/04/17  PCP: Leighton Ruff, MD  Admitted from: Home Discharged to: Home  Admit date: 03/18/2019 Discharge date: 03/20/2019  Recommendations for Outpatient Follow-up:   Follow-up Information    Jerline Pain, MD Follow up.   Specialty: Cardiology Why: Cardiology hospital follow-up with Dr. Marlou Porch' nurse practitioner, Cecilie Kicks, on 04/03/2019 at 3:45 PM.  Please arrive 15 minutes early for check-in. Contact information: [id number] [place] [address] [phone]        [facility] Follow up.   Specialty: Cardiology Why: You will be called by our office to arrange for an outpatient heart monitor.  This will be worn for 30 days and then returned. Contact information: [address] [place]       Leighton Ruff, MD. Schedule an appointment as soon as possible for a visit in 1 week(s).   Specialty: Family Medicine Why: To be seen with repeat labs (CBC & CMP). Contact information: [place] [address] [phone]            Home Health: None Equipment/Devices: None  Discharge Condition: Improved and stable CODE STATUS: Full Diet recommendation: Heart healthy and diabetic diet.  Discharge Diagnoses:  Principal Problem:   Atrial fibrillation ([place]) Active Problems:   Hypertension   Hyperlipidemia   Coronary atherosclerosis of native coronary artery   CKD (chronic kidney disease) stage 3, GFR 30-59 ml/min   Near syncope   PAF (paroxysmal atrial fibrillation) (HCC)   Brief Summary: 86 year old male with extensive PMH including anxiety and depression, stage III CKD, CAD s/p CABG, s/p DES LAD, DM 2, GERD, gout, HLD, HTN, insomnia, OSA on CPAP, recent COVID-19 diagnosis on 03/10/2019 s/p remdesivir, presented to the ED on 1/16 due to palpitations and questionable near syncope  versus falling asleep.  He was given a BP monitor and it read out arrhythmia 4 times on day of admission.  He thought he was having a panic attack and took half an Ativan and his palpitations and associated dyspnea resolved.  When he checked his heart rate it was in the 60s.  In the ED he was noted to have new onset atrial fibrillation with occasional bradycardic spells in the low 40s which were asymptomatic.  He was admitted due to possible near syncope in the context of new onset A. fib and associated bradycardia for further evaluation.  Cardiology consulted.   Assessment & Plan:   Paroxysmal atrial fibrillation He has history of postop PAF after CABG in 2013 but no recurrent arrhythmia since.  Cardiology was consulted.  Continued metoprolol 25 mg twice daily.  Controlled ventricular rate on telemetry and no pauses or significant bradycardia noted. CHA2DS2-VASc: 5.  As per cardiology and discussed with patient, started Eliquis per pharmacy.  Patient is still physically active and does yard work and all ADLs.  No fall history reported.  TSH normal.  Cardiology follow-up appreciated and I discussed with Dr. Tamala Julian.  Agrees with discontinuing aspirin since Eliquis has been initiated to reduce bleeding risk.  They have set up a 30-day monitor and outpatient follow-up.  Near syncope History is not very clear.  Patient states that historically he does not sleep well at night.  He was reportedly sitting on a chair when he had couple episodes where he feels like he might have slept off but not sure if he passed out.  These episodes

## 2019-03-20 NOTE — Progress Notes (Signed)
SATURATION QUALIFICATIONS: (This note is used to comply with regulatory documentation for home oxygen)  Patient Saturations on Room Air at Rest = 94%  Patient Saturations on Room Air while Ambulating = 90%  Please briefly explain why patient needs home oxygen: N/A, Pt does not meet requirements for home oxygen needs

## 2019-03-20 NOTE — Progress Notes (Signed)
Pt given discharge instructions, prescriptions, and care notes. Pt verbalized understanding AEB no further questions or concerns at this time. IV was discontinued, no redness, pain, or swelling noted at this time. Telemetry discontinued and Centralized Telemetry was notified. Pt left the floor via wheelchair with staff in stable condition. 

## 2019-03-20 NOTE — TOC Benefit Eligibility Note (Signed)
Transition of Care [facility]) Benefit Eligibility Note    Patient Details  Name: Cody Thomas MRN: [id number] Date of Birth: 23-Oct-1930   Medication/Dose: Arne Cleveland   2.5 MG BID  Covered?: Yes  Tier: 3 Drug  Prescription Coverage Preferred Pharmacy: [facility] with Person/Company/Phone Number:: CHRISTINE  @ [employer] # [phone]  Co-Pay: $ 456.85  Prior Approval: No  Deductible: (NO DEDUCTIBLE /  OUT-OF-POCKET: NOT MET)  Additional Notes: ,ELIQUIS 5 MG BID, COVER-YES , CO-PAY- $ 456.85 , P/A-NO .   ( ELIQUIS  10 MG BID : NON-FORMULARY  P/A YES  )    Memory Argue Phone Number: 03/20/2019, 10:39 AM

## 2019-03-20 NOTE — Progress Notes (Signed)
   Order has been placed for 30 day outpatient monitor. The patient will be called to arrange.  Daune Perch, AGNP-C [facility] 03/20/2019  1:41 PM

## 2019-03-20 NOTE — Discharge Instructions (Addendum)
Information on my medicine - ELIQUIS (apixaban)  This medication education was reviewed with me or my healthcare representative as part of my discharge preparation.  Why was Eliquis prescribed for you? Eliquis was prescribed for you to reduce the risk of a blood clot forming that can cause a stroke if you have a medical condition called atrial fibrillation (a type of irregular heartbeat).  What do You need to know about Eliquis ? Take your Eliquis TWICE DAILY - one tablet in the morning and one tablet in the evening with or without food. If you have difficulty swallowing the tablet whole please discuss with your pharmacist how to take the medication safely.  Take Eliquis exactly as prescribed by your doctor and DO NOT stop taking Eliquis without talking to the doctor who prescribed the medication.  Stopping may increase your risk of developing a stroke.  Refill your prescription before you run out.  After discharge, you should have regular check-up appointments with your healthcare provider that is prescribing your Eliquis.  In the future your dose may need to be changed if your kidney function or weight changes by a significant amount or as you get older.  What do you do if you miss a dose? If you miss a dose, take it as soon as you remember on the same day and resume taking twice daily.  Do not take more than one dose of ELIQUIS at the same time to make up a missed dose.  Important Safety Information A possible side effect of Eliquis is bleeding. You should call your healthcare provider right away if you experience any of the following: ? Bleeding from an injury or your nose that does not stop. ? Unusual colored urine (red or dark brown) or unusual colored stools (red or black). ? Unusual bruising for unknown reasons. ? A serious fall or if you hit your head (even if there is no bleeding).  Some medicines may interact with Eliquis and might increase your risk of bleeding or  clotting while on Eliquis. To help avoid this, consult your healthcare provider or pharmacist prior to using any new prescription or non-prescription medications, including herbals, vitamins, non-steroidal anti-inflammatory drugs (NSAIDs) and supplements.  This website has more information on Eliquis (apixaban): http://www.eliquis.com/eliquis/home   Additional discharge instructions  Please get your medications reviewed and adjusted by your Primary MD.  Please request your Primary MD to go over all Hospital Tests and Procedure/Radiological results at the follow up, please get all Hospital records sent to your Prim MD by signing hospital release before you go home.  If you had Pneumonia of Lung problems at the Hospital: Please get a 2 view Chest X ray done in 6-8 weeks after hospital discharge or sooner if instructed by your Primary MD.  If you have Congestive Heart Failure: Please call your Cardiologist or Primary MD anytime you have any of the following symptoms:  1) 3 pound weight gain in 24 hours or 5 pounds in 1 week  2) shortness of breath, with or without a dry hacking cough  3) swelling in the hands, feet or stomach  4) if you have to sleep on extra pillows at night in order to breathe  Follow cardiac low salt diet and 1.5 lit/day fluid restriction.  If you have diabetes Accuchecks 4 times/day, Once in AM empty stomach and then before each meal. Log in all results and show them to your primary doctor at your next visit. If any glucose reading is under 80   or above 300 call your primary MD immediately.  If you have Seizure/Convulsions/Epilepsy: Please do not drive, operate heavy machinery, participate in activities at heights or participate in high speed sports until you have seen by Primary MD or a Neurologist and advised to do so again.  If you had Gastrointestinal Bleeding: Please ask your Primary MD to check a complete blood count within one week of discharge or at your  next visit. Your endoscopic/colonoscopic biopsies that are pending at the time of discharge, will also need to followed by your Primary MD.  Get Medicines reviewed and adjusted. Please take all your medications with you for your next visit with your Primary MD  Please request your Primary MD to go over all hospital tests and procedure/radiological results at the follow up, please ask your Primary MD to get all Hospital records sent to his/her office.  If you experience worsening of your admission symptoms, develop shortness of breath, life threatening emergency, suicidal or homicidal thoughts you must seek medical attention immediately by calling 911 or calling your MD immediately  if symptoms less severe.  You must read complete instructions/literature along with all the possible adverse reactions/side effects for all the Medicines you take and that have been prescribed to you. Take any new Medicines after you have completely understood and accpet all the possible adverse reactions/side effects.   Do not drive or operate heavy machinery when taking Pain medications.   Do not take more than prescribed Pain, Sleep and Anxiety Medications  Special Instructions: If you have smoked or chewed Tobacco  in the last 2 yrs please stop smoking, stop any regular Alcohol  and or any Recreational drug use.  Wear Seat belts while driving.  Please note You were cared for by a hospitalist during your hospital stay. If you have any questions about your discharge medications or the care you received while you were in the hospital after you are discharged, you can call the unit and asked to speak with the hospitalist on call if the hospitalist that took care of you is not available. Once you are discharged, your primary care physician will handle any further medical issues. Please note that NO REFILLS for any discharge medications will be authorized once you are discharged, as it is imperative that you return to  your primary care physician (or establish a relationship with a primary care physician if you do not have one) for your aftercare needs so that they can reassess your need for medications and monitor your lab values.  You can reach the hospitalist office at phone [phone] or fax [phone]   If you do not have a primary care physician, you can call [phone] for a physician referral.   

## 2019-03-20 NOTE — Progress Notes (Signed)
VASCULAR LAB PRELIMINARY  PRELIMINARY  PRELIMINARY  PRELIMINARY  Carotid duplex completed.    Preliminary report:  See CV proc for preliminary results.   Bambi Fehnel, RVT 03/20/2019, 1:25 PM

## 2019-03-20 NOTE — TOC Transition Note (Signed)
Transition of Care [facility]) - CM/SW Discharge Note   Patient Details  Name: Cody Thomas MRN: [id number] Date of Birth: [birth year]  Transition of Care [facility]) CM/SW Contact:  Maryclare Labrador, RN Phone Number: 03/20/2019, 3:18 PM   Clinical Narrative:   Pt will discharge home today - pt plans to drive himself home.  Pt will discharge home on Eliquis - benefit check submitted that resulted in high copay due to out of pocket not yet being met for year.  CM discussed copay with pt - pt informed CM that he will pay the high copay for it if he has too.  CM explained that during his follow up appt with cardiology 2/1 - he should discuss if this copay will cause hardship.  CM  called pharmacy of choice [facility] in [place] and provided free 30 day benefit for Eliquis.- per pharmacist benefit was applied successfully.     CM contacted PCP and set up televist for 1/25 at 2:45 - office informed that discharging doctor request follow up labs in a week.  CM was informed that PCP will access the need for recommended labs during the telehealth visit next Monday.  CM also informed cardiology office that pt is covid positive - office to follow up with pt regarding if this will be in person visit or telehealth visit in February.      Final next level of care: Home/Self Care     Patient Goals and CMS Choice        Discharge Placement                       Discharge Plan and Services                                     Social Determinants of Health (SDOH) Interventions     Readmission Risk Interventions No flowsheet data found.

## 2019-03-20 NOTE — Progress Notes (Addendum)
The patient has been seen in conjunction with Daune Perch, NP. All aspects of care have been considered and discussed. The patient has been personally interviewed, examined, and all clinical data has been reviewed.   Up and moving about.  Rate control is reasonable.  He will need to be set up with a 30-day monitor to ensure further information about rate control and exclude pauses/excessive bradycardia as cause of near syncope.  Will also need to be monitored for bleeding given age and institution of apixaban.   Progress Note  Patient Name: Cody Thomas Date of Encounter: 03/20/2019  Primary Cardiologist: Candee Furbish, MD   Subjective   Per phone call into room, patient denies any chest discomfort or shortness of breath.  He says that he has been up walking in the halls and felt good.  He has not had any more near syncope.  Inpatient Medications    Scheduled Meds: . apixaban  5 mg Oral BID  . B-complex with vitamin C  1 tablet Oral Daily  . losartan  100 mg Oral Daily   And  . hydrochlorothiazide  25 mg Oral Daily  . insulin aspart  0-5 Units Subcutaneous QHS  . insulin aspart  0-9 Units Subcutaneous TID WC  . metoprolol tartrate  25 mg Oral BID  . omega-3 acid ethyl esters  1,000 mg Oral Daily  . vitamin E  100 Units Oral Daily   Continuous Infusions:  PRN Meds: acetaminophen, fluticasone, LORazepam, traMADol   Vital Signs    Vitals:   03/19/19 1357 03/19/19 2118 03/20/19 0430 03/20/19 0800  BP: (!) 157/84 (!) 199/108 (!) 150/94 (!) 143/91  Pulse: 64 72 86 71  Resp: 20 20 20 18   Temp: 98.9 F (37.2 C) 98.7 F (37.1 C)    TempSrc: Oral Oral    SpO2: 96% 91%  93%  Weight:      Height:        Intake/Output Summary (Last 24 hours) at 03/20/2019 1050 Last data filed at 03/19/2019 1351 Gross per 24 hour  Intake 240 ml  Output --  Net 240 ml   Last 3 Weights 03/19/2019 01/17/2019 01/12/2019  Weight (lbs) 182 lb 184 lb 9.6 oz 182 lb 3.2 oz  Weight  (kg) 82.555 kg 83.734 kg 82.645 kg      Telemetry    Atrial fibrillation 50s-70s- Personally Reviewed  ECG    No new tracings for review  Physical Exam   Physical exam not done due to positive Covid status  Labs    High Sensitivity Troponin:   Recent Labs  Lab 03/18/19 1828 03/18/19 2112 03/19/19 0345  TROPONINIHS 12 13 16       Chemistry Recent Labs  Lab 03/18/19 1828 03/19/19 0345 03/20/19 0635  NA 134* 139 137  K 3.7 3.5 3.5  CL 97* 100 98  CO2 26 27 26   GLUCOSE 140* 114* 153*  BUN 26* 23 15  CREATININE 1.64* 1.41* 1.30*  CALCIUM 8.9 8.9 9.3  PROT  --  6.0* 6.6  ALBUMIN  --  3.2* 3.4*  AST  --  24 28  ALT  --  21 21  ALKPHOS  --  45 46  BILITOT  --  1.1 1.4*  GFRNONAA 37* 45* 49*  GFRAA 43* 52* 57*  ANIONGAP 11 12 13      Hematology Recent Labs  Lab 03/18/19 1828 03/19/19 0345 03/20/19 0635  WBC 6.1 5.8 7.1  RBC 4.77 4.62 4.72  HGB 15.7 14.8  15.2  HCT 45.4 43.0 43.9  MCV 95.2 93.1 93.0  MCH 32.9 32.0 32.2  MCHC 34.6 34.4 34.6  RDW 12.1 12.2 12.1  PLT 157 135* 169    BNP Recent Labs  Lab 03/18/19 2301  BNP 786.5*     DDimer  Recent Labs  Lab 03/18/19 2301  DDIMER 0.82*     Radiology    DG Chest 2 View  Result Date: 03/18/2019 CLINICAL DATA:  86 year old male with shortness of breath. EXAM: CHEST - 2 VIEW COMPARISON:  Chest CT dated 10/29/2016. FINDINGS: No focal consolidation, pleural effusion or pneumothorax. The cardiac silhouette is within normal limits. Median sternotomy wires and CABG vascular clips. Atherosclerotic calcification of the aorta. The aorta is tortuous. No acute osseous pathology. IMPRESSION: No active cardiopulmonary disease. Electronically Signed   By: Anner Crete M.D.   On: 03/18/2019 19:04   CT HEAD WO CONTRAST  Result Date: 03/19/2019 CLINICAL DATA:  Syncope/presyncope. Cardiac cause suspected. EXAM: CT HEAD WITHOUT CONTRAST TECHNIQUE: Contiguous axial images were obtained from the base of the skull  through the vertex without intravenous contrast. COMPARISON:  CT head 09/12/2008 FINDINGS: Brain: Mild atrophy and white matter changes progressed over the last 10 years. Basal ganglia are intact. No acute or focal cortical abnormalities are present. No acute infarct, hemorrhage, or mass lesion is present. No significant extraaxial fluid collection is present. The ventricles are of normal size. The brainstem and cerebellum are within normal limits. Vascular: Extensive vascular calcifications are present. No hyperdense vessel is present. Skull: Insert normal skull No significant extracranial soft tissue lesion is present. Sinuses/Orbits: Mild mucosal thickening is present in the ethmoid air cells. The paranasal sinuses and mastoid air cells are otherwise clear. The globes and orbits are within normal limits. IMPRESSION: 1. Progressive atrophy and white matter disease. This likely reflects the sequela of chronic microvascular ischemia. 2. No acute intracranial abnormality. Electronically Signed   By: San Morelle M.D.   On: 03/19/2019 06:06   ECHOCARDIOGRAM COMPLETE  Result Date: 03/19/2019   ECHOCARDIOGRAM REPORT   Patient Name:   Cody Thomas Date of Exam: 03/19/2019 Medical Rec #:  [id number]          Height:       63.0 in Accession #:    [id number]         Weight:       182.0 lb Date of Birth:  May 14, 1930           BSA:          1.86 m Patient Age:    26 years           BP:           157/84 mmHg Patient Gender: M                  HR:           66 bpm. Exam Location:  Inpatient Procedure: 2D Echo, Color Doppler and Cardiac Doppler Indications:    I48.91* Unspecified atrial fibrillation  History:        Patient has no prior history of Echocardiogram examinations.                 Prior CABG, Arrythmias:Atrial Fibrillation; Risk                 Factors:Hypertension, Dyslipidemia, Diabetes and Sleep Apnea.                 COVID+ 03/12/19.  Sonographer:  Raquel Sarna Senior RDCS Referring Phys: [place]  1. Left ventricular ejection fraction, by visual estimation, is 60 to 65%. The left ventricle has normal function. Left ventricular septal wall thickness was mildly increased. There is mildly increased left ventricular hypertrophy.  2. The left ventricle has no regional wall motion abnormalities.  3. Global right ventricle has mildly reduced systolic function.The right ventricular size is mildly enlarged. No increase in right ventricular wall thickness.  4. Left atrial size was normal.  5. Right atrial size was normal.  6. Mild mitral annular calcification.  7. Moderate calcification of the mitral valve leaflet(s).  8. Moderate thickening of the mitral valve leaflet(s).  9. The mitral valve is degenerative. Mild mitral valve regurgitation. No evidence of mitral stenosis. 10. The tricuspid valve is normal in structure. 11. The aortic valve is tricuspid. Aortic valve regurgitation is trivial. Mild aortic valve sclerosis without stenosis. 12. Pulmonic regurgitation is mild. 13. The pulmonic valve was not well visualized. Pulmonic valve regurgitation is mild. 14. Normal pulmonary artery systolic pressure. 15. The inferior vena cava is normal in size with greater than 50% respiratory variability, suggesting right atrial pressure of 3 mmHg. FINDINGS  Left Ventricle: Left ventricular ejection fraction, by visual estimation, is 60 to 65%. The left ventricle has normal function. The left ventricle has no regional wall motion abnormalities. There is mildly increased left ventricular hypertrophy. Normal left atrial pressure. Right Ventricle: The right ventricular size is mildly enlarged. No increase in right ventricular wall thickness. Global RV systolic function is has mildly reduced systolic function. The tricuspid regurgitant velocity is 2.32 m/s, and with an assumed right atrial pressure of 3 mmHg, the estimated right ventricular systolic pressure is normal at 24.5 mmHg. Left Atrium: Left atrial size  was normal in size. Right Atrium: Right atrial size was normal in size Pericardium: There is no evidence of pericardial effusion. Mitral Valve: The mitral valve is degenerative in appearance. There is moderate thickening of the mitral valve leaflet(s). There is moderate calcification of the mitral valve leaflet(s). Mild mitral annular calcification. Mild mitral valve regurgitation.  No evidence of mitral valve stenosis by observation. Tricuspid Valve: The tricuspid valve is normal in structure. Tricuspid valve regurgitation is mild. Aortic Valve: The aortic valve is tricuspid. Aortic valve regurgitation is trivial. Mild aortic valve sclerosis is present, with no evidence of aortic valve stenosis. Pulmonic Valve: The pulmonic valve was not well visualized. Pulmonic valve regurgitation is mild. Pulmonic regurgitation is mild. Aorta: The aortic root, ascending aorta and aortic arch are all structurally normal, with no evidence of dilitation or obstruction. Venous: The inferior vena cava is normal in size with greater than 50% respiratory variability, suggesting right atrial pressure of 3 mmHg. IAS/Shunts: No atrial level shunt detected by color flow Doppler. There is no evidence of a patent foramen ovale. No ventricular septal defect is seen or detected. There is no evidence of an atrial septal defect.  LEFT VENTRICLE PLAX 2D LVIDd:         3.30 cm LVIDs:         2.20 cm LV PW:         1.30 cm LV IVS:        1.30 cm LVOT diam:     2.00 cm LV SV:         28 ml LV SV Index:   14.34 LVOT Area:     3.14 cm  RIGHT VENTRICLE TAPSE (M-mode): 1.8 cm LEFT ATRIUM  Index       RIGHT ATRIUM           Index LA diam:        3.60 cm 1.94 cm/m  RA Area:     26.60 cm LA Vol (A2C):   92.4 ml 49.74 ml/m RA Volume:   80.90 ml  43.55 ml/m LA Vol (A4C):   53.8 ml 28.96 ml/m LA Biplane Vol: 72.9 ml 39.24 ml/m  AORTIC VALVE LVOT Vmax:   74.40 cm/s LVOT Vmean:  51.500 cm/s LVOT VTI:    0.188 m  AORTA Ao Root diam: 3.10 cm Ao  Asc diam:  3.60 cm TRICUSPID VALVE TR Peak grad:   21.5 mmHg TR Vmax:        232.00 cm/s  SHUNTS Systemic VTI:  0.19 m Systemic Diam: 2.00 cm  Jenkins Rouge MD Electronically signed by Jenkins Rouge MD Signature Date/Time: 03/19/2019/3:33:33 PM    Final     Cardiac Studies   Echocardiogram 03/19/2019 IMPRESSIONS  1. Left ventricular ejection fraction, by visual estimation, is 60 to 65%. The left ventricle has normal function. Left ventricular septal wall thickness was mildly increased. There is mildly increased left ventricular hypertrophy.  2. The left ventricle has no regional wall motion abnormalities.  3. Global right ventricle has mildly reduced systolic function.The right ventricular size is mildly enlarged. No increase in right ventricular wall thickness.  4. Left atrial size was normal.  5. Right atrial size was normal.  6. Mild mitral annular calcification.  7. Moderate calcification of the mitral valve leaflet(s).  8. Moderate thickening of the mitral valve leaflet(s).  9. The mitral valve is degenerative. Mild mitral valve regurgitation. No evidence of mitral stenosis. 10. The tricuspid valve is normal in structure. 11. The aortic valve is tricuspid. Aortic valve regurgitation is trivial. Mild aortic valve sclerosis without stenosis. 12. Pulmonic regurgitation is mild. 13. The pulmonic valve was not well visualized. Pulmonic valve regurgitation is mild. 14. Normal pulmonary artery systolic pressure. 15. The inferior vena cava is normal in size with greater than 50% respiratory variability, suggesting right atrial pressure of 3 mmHg.  Patient Profile     86 y.o. male with past medical history significant for CAD s/p CABG 2013 with postop A. fib and no recurrent arrhythmia or angina since.  He has OSA on CPAP.  He had recent Covid infection treated at home s/p remdesivir 03/15/2019.  He was admitted on 03/18/2019 with new onset atrial fibrillation and presyncope.  Assessment & Plan      Paroxysmal atrial fibrillation -On low-dose metoprolol for rate control.  Continues in atrial fibrillation with rates in the 50s at rest up to the 70s. -CHA2DS2/VAS Stroke Risk Score 5 (CAD, HTN, age (2), DM).  Patient has been started on Eliquis 5 mg p.o. twice daily for stroke risk reduction. -Echocardiogram with normal LV systolic function, EF [id number], mild LVH, normal biatrial size, mild MR and trivial aortic regurgitation, mild aortic valve sclerosis without stenosis. -I have arranged for hospital follow up in our office on 04/03/2019.   Presyncope -Etiology unclear, may be related to PAF. -Echo with normal LV systolic function and no significant valvular disease. -Patient continues in atrial fibrillation with rates somewhat low in the 50s, up to the 70s with activity.  No long pauses or significant arrhythmias to account for his symptoms.  Dyspnea  -Chest x-ray with no active disease, recent Covid infection.  D-dimer mildly elevated.  Anticoagulation started for PAF.  CAD s/p CABG 2013 -  Negative troponins and nonischemic EKG.      For questions or updates, please contact [place] Please consult [email or web address] for contact info under        Signed, Daune Perch, NP  03/20/2019, 10:50 AM

## 2019-03-22 ENCOUNTER — Telehealth: Payer: Self-pay | Admitting: *Deleted

## 2019-03-22 NOTE — Telephone Encounter (Signed)
Patient enrolled for [employer] to ship a 30 day cardiac event monitor to his home.  Please confirm insurance information when they call to confirm a shipping address.  Monitor instructions will be included in the monitor kit.

## 2019-03-29 ENCOUNTER — Encounter: Payer: Self-pay | Admitting: Cardiology

## 2019-03-29 ENCOUNTER — Ambulatory Visit (INDEPENDENT_AMBULATORY_CARE_PROVIDER_SITE_OTHER): Payer: Medicare Other

## 2019-03-29 DIAGNOSIS — R55 Syncope and collapse: Secondary | ICD-10-CM

## 2019-03-29 DIAGNOSIS — I480 Paroxysmal atrial fibrillation: Secondary | ICD-10-CM

## 2019-04-03 ENCOUNTER — Ambulatory Visit: Payer: Medicare Other | Admitting: Family Medicine

## 2019-04-03 ENCOUNTER — Encounter: Payer: Self-pay | Admitting: Cardiology

## 2019-04-03 ENCOUNTER — Other Ambulatory Visit: Payer: Self-pay

## 2019-04-03 VITALS — BP 144/80 | HR 55 | Ht 63.0 in | Wt 175.0 lb

## 2019-04-03 DIAGNOSIS — I2510 Atherosclerotic heart disease of native coronary artery without angina pectoris: Secondary | ICD-10-CM

## 2019-04-03 DIAGNOSIS — R55 Syncope and collapse: Secondary | ICD-10-CM | POA: Diagnosis not present

## 2019-04-03 DIAGNOSIS — I480 Paroxysmal atrial fibrillation: Secondary | ICD-10-CM | POA: Diagnosis not present

## 2019-04-03 DIAGNOSIS — I10 Essential (primary) hypertension: Secondary | ICD-10-CM | POA: Diagnosis not present

## 2019-04-03 NOTE — Progress Notes (Signed)
Cardiology Office Note  Date: 04/03/2019   ID: ANREW BUCHKO, DOB [birth year], MRN [id number]  PCP:  Juluis Rainier, MD  Cardiologist:  Donato Schultz, MD Electrophysiologist:  None   Chief Complaint  Patient presents with  . Follow-up    Atrial fibrillation    History of Present Illness: Cody Thomas is a 86 y.o. male last encounter via telemedicine with Dr. Anne Fu.  Jul 05, 2018.  Past medical history of coronary artery disease status post CABG in 2013 x4 with brief episode of postoperative atrial fibrillation, hypertension, bladder cancer, thrombocytopenia, statin intolerance,chronic kidney disease stage III, hyperlipidemia, shortness of breath with exertion with history of emphysema and pulmonary nodules.  Previous history of smoking.  Patient recently had a hospital admission on January 16 due to palpitations and question of near syncope versus falling asleep.  Patient took an Ativan because he thought he was having a panic attack and palpitations and associated dyspnea resolved.  In the emergency room he was noted to have new onset atrial fibrillation with occasional bradycardic spells in the low 40s which were asymptomatic.   His CHA2DS2-VASc score was 5.  Metoprolol 25 mg twice a day was continued.  Eliquis was started per pharmacy.  Aspirin was discontinued to reduce bleeding risk.  Patient was scheduled for a 30-day monitor as outpatient follow-up.  He has near syncopal episode history was not clear.  He described reportedly sitting on a chair when he a couple of episodes where he felt like he might have fell asleep but is not sure if he passed out.  He described these episodes as only lasting a few seconds.  His echocardiogram showed normal EF.  Carotid study showed bilateral 1 to 39% ICA stenosis.  CT of the head showed nothing acute.  He had recently been diagnosed with Covid infection on January 8 and had remdesivir as part of his treatment.  His chest x-ray showed no  acute findings.  He ambulated in the halls during hospital stay with no hypoxia.  He arrives today with no particular complaints.  He denies any syncopal or near syncopal episodes, palpitations or arrhythmias, orthostatic symptoms.  He is inquiring as to whether the Covid infection may have contributed to his atrial fibrillation.  Patient gives a history of having atrial fibrillation starting 30 years ago.  Patient denied ever having been on any anticoagulant medications other than simple aspirin.  He had some brief postop atrial fibrillation after his bypass surgery in 2013.  Patient states he cannot afford Eliquis.  He states his co-pay for Eliquis per month would be $599.  He wants to explore other options.  Medications were reviewed today.  Cardiac medications include Eliquis 5 mg p.o. twice daily, Hyzaar 100/25 mg p.o. daily, metoprolol tartrate 25 mg p.o. twice daily, Crestor 20 mg daily.  Past Medical History:  Diagnosis Date  . Anxiety   . Bunion 09/29/2011  . Cancer (HCC)    nose/Dr. Albertini;skin  . Chronic constipation   . Chronic kidney disease    stage 111  . Coronary atherosclerosis of native coronary artery    2009 LAD CIRC DES  . Depression    doesn't take any meds for this  . Diabetes mellitus    takes Januvia daily  . Dizziness   . Enlarged prostate    but doesn't require meds at present  . GERD (gastroesophageal reflux disease)    TUMS prn  . H/O hiatal hernia   . Headache(784.0)   .  History of gout    doesn't require meds  . Hx of cardiac cath   . Hyperlipidemia    Crestor 3 x wk and Zetia daily  . Hypertension    takes Hyzaar daily  . Insomnia    doesn't require meds at present time  . Joint pain   . Onychomycosis 10/05/2012   x 10  . PONV (postoperative nausea and vomiting)    pt states extremely sick  . Shortness of breath    with exertion    Past Surgical History:  Procedure Laterality Date  . bladder cancer    . CARDIAC CATHETERIZATION   2013  . cataracts removed    . CHOLECYSTECTOMY    . COLONOSCOPY    . CORONARY ANGIOPLASTY     2 stents from 2009  . CORONARY ARTERY BYPASS GRAFT  10/29/2011   Procedure: CORONARY ARTERY BYPASS GRAFTING (CABG);  Surgeon: Kerin Perna, MD;  Location: [facility] OR;  Service: Open Heart Surgery;  Laterality: N/A;  . cyst removed from finger     right hand  . EYE SURGERY     bilateral cataracts  . HEMORRHOID SURGERY    . HERNIA REPAIR     x 2;inguinal  . KIDNEY STONE SURGERY    . LEFT HEART CATHETERIZATION WITH CORONARY ANGIOGRAM N/A 10/21/2011   Procedure: LEFT HEART CATHETERIZATION WITH CORONARY ANGIOGRAM;  Surgeon: Donato Schultz, MD;  Location: [facility] CATH LAB;  Service: Cardiovascular;  Laterality: N/A;  . rotator cuff surgery     right   . skin cancer removed      Current Outpatient Medications  Medication Sig Dispense Refill  . apixaban (ELIQUIS) 5 MG TABS tablet Take 1 tablet (5 mg total) by mouth 2 (two) times daily. 60 tablet 0  . b complex vitamins capsule Take 1 capsule by mouth daily.    . calcium carbonate (TUMS - DOSED IN MG ELEMENTAL CALCIUM) 500 MG chewable tablet Chew 2 tablets by mouth daily as needed. For indigestion.     . Coenzyme Q10-Vitamin E 100-1 MG-UNT/5ML SYRP Take 100 mg by mouth daily. Take 2 tablespoons (100MG ) Daily    . fish oil-omega-3 fatty acids 1000 MG capsule Take 1,200 mg by mouth daily. 4 1200 tablets daily    . fluticasone (FLONASE) 50 MCG/ACT nasal spray Place 1 spray into both nostrils daily as needed for allergies or rhinitis.    Marland Kitchen LORazepam (ATIVAN) 0.5 MG tablet Take 0.25-0.5 mg by mouth daily as needed for anxiety.   0  . losartan-hydrochlorothiazide (HYZAAR) 100-25 MG tablet TAKE 1 TABLET BY MOUTH EVERY DAY 90 tablet 3  . metFORMIN (GLUCOPHAGE-XR) 500 MG 24 hr tablet TAKE 1 TABLET BY MOUTH TWICE DAILY 180 tablet 1  . metoprolol tartrate (LOPRESSOR) 25 MG tablet TAKE 1 TABLET BY MOUTH TWICE DAILY 180 tablet 3  . rosuvastatin (CRESTOR) 20 MG tablet Take  20 mg by mouth See admin instructions. Take 1 tablet by mouth three times weekly.    . traMADol (ULTRAM) 50 MG tablet TAKE 1 TABLET BY MOUTH EVERY 6 HOURS AS NEEDED 30 tablet 0   No current facility-administered medications for this visit.   Allergies:  Crestor [rosuvastatin], Sertraline, and Zocor [simvastatin]   Social History: The patient  reports that he quit smoking about 35 years ago. His smoking use included cigarettes. He has a 45.00 pack-year smoking history. He has never used smokeless tobacco. He reports that he does not drink alcohol or use drugs.   Family History: The  patient's family history includes Cancer in his brother; Heart disease in his mother; Stroke in his father.   ROS:  Please see the history of present illness. Otherwise, complete review of systems is positive for none.  All other systems are reviewed and negative.   Physical Exam: VS:  BP (!) 144/80   Pulse (!) 55   Ht 5\' 3"  (1.6 m)   Wt 175 lb (79.4 kg)   BMI 31.00 kg/m , BMI Body mass index is 31 kg/m.  Wt Readings from Last 3 Encounters:  04/03/19 175 lb (79.4 kg)  03/19/19 182 lb (82.6 kg)  01/17/19 184 lb 9.6 oz (83.7 kg)    General: Patient appears comfortable at rest. Neck: Supple, no elevated JVP or carotid bruits, no thyromegaly. Lungs: Clear to auscultation, nonlabored breathing at rest. Cardiac: Regular rate and rhythm, no S3 or significant systolic murmur, no pericardial rub. Extremities: No pitting edema, distal pulses 2+. Skin: Warm and dry. Neuropsychiatric: Alert and oriented x3, affect grossly appropriate.  ECG:  An ECG dated April 03, 2019 was personally reviewed today and demonstrated:  Sinus bradycardia with first-degree AV block rate of 55  Recent Labwork: 03/18/2019: B Natriuretic Peptide 786.5 03/19/2019: TSH 2.224 03/20/2019: ALT 21; AST 28; BUN 15; Creatinine, Ser 1.30; Hemoglobin 15.2; Platelets 169; Potassium 3.5; Sodium 137     Component Value Date/Time   CHOL 116  10/06/2018 0000   TRIG 158 10/06/2018 0000   HDL 34 (A) 10/06/2018 0000   CHOLHDL 3 05/05/2018 0931   VLDL 22.0 05/05/2018 0931   LDLCALC 51 10/06/2018 0000    Other Studies Reviewed Today:  Carotid artery duplex study 03/20/2019. Right Carotid: Velocities in the right ICA are consistent with a 1-39% stenosis. No significant change since study done 10/2011. Left Carotid: Velocities in the left ICA are consistent with a 1-39% stenosis. No significant change since study done 10/2011. Vertebrals: Bilateral vertebral arteries demonstrate antegrade flow.   Echocardiogram on March 19, 2019 IMPRESSIONS  1. Left ventricular ejection fraction, by visual estimation, is 60 to  65%. The left ventricle has normal function. Left ventricular septal wall  thickness was mildly increased. There is mildly increased left ventricular  hypertrophy.  2. The left ventricle has no regional wall motion abnormalities.  3. Global right ventricle has mildly reduced systolic function.The right  ventricular size is mildly enlarged. No increase in right ventricular wall  thickness.  4. Left atrial size was normal.  5. Right atrial size was normal.  6. Mild mitral annular calcification.  7. Moderate calcification of the mitral valve leaflet(s).  8. Moderate thickening of the mitral valve leaflet(s).  9. The mitral valve is degenerative. Mild mitral valve regurgitation. No  evidence of mitral stenosis.  10. The tricuspid valve is normal in structure.  11. The aortic valve is tricuspid. Aortic valve regurgitation is trivial.  Mild aortic valve sclerosis without stenosis.  12. Pulmonic regurgitation is mild.  13. The pulmonic valve was not well visualized. Pulmonic valve  regurgitation is mild.  14. Normal pulmonary artery systolic pressure.  15. The inferior vena cava is normal in size with greater than 50%  respiratory variability, suggesting right atrial pressure of 3 mmHg.   CT head 03/19/2019    IMPRESSION: 1. Progressive atrophy and white matter disease. This likely reflects the sequela of chronic microvascular ischemia. 2. No acute intracranial abnormality.  Assessment and Plan:  1. PAF (paroxysmal atrial fibrillation) (HCC)   2. Near syncope   3. Essential hypertension  4. Atherosclerosis of native coronary artery of native heart without angina pectoris    1. PAF (paroxysmal atrial fibrillation) (HCC) Patient had a recent hospitalization for complaints of syncopal/near syncopal episode/palpitations/panic attack.  He was found to be in atrial fibrillation on arrival to the hospital on January 16.  This was diagnosed as new onset.  However today patient states he has had a history of atrial fibrillation as long as 30 years.  He had some postop atrial fibrillation after bypass surgery in 2013.  Today's EKG shows sinus bradycardia with prolonged PR interval of 312 ms.  Rate of 55 bpm.  We will refer patient to Coumadin clinic for transition to warfarin instead of Eliquis.  Continue Eliquis until your appointment with Coumadin clinic.  Continue metoprolol 25 mg p.o. twice daily.  Patient is wearing an event monitor today for 30 days.  2. Near syncope Patient states that since discharged from the hospital he has had no further syncopal/near syncopal episodes or sensations of "falling asleep".  3. Essential hypertension Blood pressure slightly elevated today. Blood pressure November 12 was 140/80.  Continue Hyzaar 100/25 mg daily.  If blood pressure remains sustained at or above 140 systolic and/or 90 diastolic may need to adjust medications in the future.  4. Atherosclerosis of native coronary artery of native heart without angina pectoris History of four-vessel CABG in 2013.  Denies any recent progressive anginal or exertional symptoms. Continue Crestor 20 mg daily.  Medication Adjustments/Labs and Tests Ordered: Current medicines are reviewed at length with the patient today.   Concerns regarding medicines are outlined above.    Patient Instructions  Medication Instructions:  Your physician recommends that you continue on your current medications as directed. Please refer to the Current Medication list given to you today.  *If you need a refill on your cardiac medications before your next appointment, please call your pharmacy*  Lab Work: None ordered  If you have labs (blood work) drawn today and your tests are completely normal, you will receive your results only by: Marland Kitchen MyChart Message (if you have MyChart) OR . A paper copy in the mail If you have any lab test that is abnormal or we need to change your treatment, we will call you to review the results.  Testing/Procedures: None ordered  Follow-Up: At [facility], you and your health needs are our priority.  As part of our continuing mission to provide you with exceptional heart care, we have created designated Provider Care Teams.  These Care Teams include your primary Cardiologist (physician) and Advanced Practice Providers (APPs -  Physician Assistants and Nurse Practitioners) who all work together to provide you with the care you need, when you need it.  Your next appointment:   IN THE NEXT 2 WEEKS WITH THE COUMADIN CLINIC FOR CONSULT TO SWITCH TO CLINIC 04/07/2019 ARRIVE AT 10:15 FOR REGISTRATION  3 MONTHS 07/05/2019 ARRIVE AT 9:25 FOR REGISTRATION  The format for your next appointment:   In Person  Provider:   You may see Donato Schultz, MD or one of the following Advanced Practice Providers on your designated Care Team:    Norma Fredrickson, NP  Nada Boozer, NP  Georgie Chard, NP   Other Instructions          Signed, Rennis Harding, NP 04/03/2019 4:55 PM    [facility] at [facility] [address] [place], [place], [place] [address] Phone: [phone]; Fax: [phone]

## 2019-04-03 NOTE — Patient Instructions (Addendum)
Medication Instructions:  Your physician recommends that you continue on your current medications as directed. Please refer to the Current Medication list given to you today.  *If you need a refill on your cardiac medications before your next appointment, please call your pharmacy*  Lab Work: None ordered  If you have labs (blood work) drawn today and your tests are completely normal, you will receive your results only by: Marland Kitchen MyChart Message (if you have MyChart) OR . A paper copy in the mail If you have any lab test that is abnormal or we need to change your treatment, we will call you to review the results.  Testing/Procedures: None ordered  Follow-Up: At [facility], you and your health needs are our priority.  As part of our continuing mission to provide you with exceptional heart care, we have created designated Provider Care Teams.  These Care Teams include your primary Cardiologist (physician) and Advanced Practice Providers (APPs -  Physician Assistants and Nurse Practitioners) who all work together to provide you with the care you need, when you need it.  Your next appointment:   IN THE NEXT 2 WEEKS WITH THE COUMADIN CLINIC FOR CONSULT TO SWITCH TO CLINIC 04/07/2019 ARRIVE AT 10:15 FOR REGISTRATION  3 MONTHS 07/05/2019 ARRIVE AT 9:25 FOR REGISTRATION  The format for your next appointment:   In Person  Provider:   You may see Candee Furbish, MD or one of the following Advanced Practice Providers on your designated Care Team:    Truitt Merle, NP  Cecilie Kicks, NP  Kathyrn Drown, NP   Other Instructions

## 2019-04-05 DIAGNOSIS — G4733 Obstructive sleep apnea (adult) (pediatric): Secondary | ICD-10-CM | POA: Diagnosis not present

## 2019-04-06 ENCOUNTER — Other Ambulatory Visit: Payer: Self-pay | Admitting: Cardiology

## 2019-04-06 ENCOUNTER — Telehealth: Payer: Self-pay | Admitting: Pharmacist

## 2019-04-06 DIAGNOSIS — E7800 Pure hypercholesterolemia, unspecified: Secondary | ICD-10-CM | POA: Diagnosis not present

## 2019-04-06 DIAGNOSIS — I480 Paroxysmal atrial fibrillation: Secondary | ICD-10-CM | POA: Diagnosis not present

## 2019-04-06 DIAGNOSIS — F419 Anxiety disorder, unspecified: Secondary | ICD-10-CM | POA: Diagnosis not present

## 2019-04-06 DIAGNOSIS — E878 Other disorders of electrolyte and fluid balance, not elsewhere classified: Secondary | ICD-10-CM | POA: Diagnosis not present

## 2019-04-06 LAB — BASIC METABOLIC PANEL: Creatinine: 1.3 (ref <=1.3)

## 2019-04-06 MED ORDER — WARFARIN SODIUM 5 MG PO TABS: ORAL_TABLET | ORAL | 0 refills | Status: DC

## 2019-04-06 NOTE — Telephone Encounter (Signed)
Called patient to discuss switch from Eliquis to warfarin. He has about 14 days of Eliquis left. Patient advised that we will overlap warfarin and Eliquis for 3 days. He is to start warfarin 5mg  daily in the evening on 2/15. He should run out of Eliquis on 2/17. Rescheduled his coumadin clinic appointment for 2/19 @ 2:30PM. Patient asked about diet with warfarin so briefly explained to keep his intake of vit K foods consistent. More education to be provided at visit.

## 2019-04-06 NOTE — Telephone Encounter (Signed)
Patient will be a new patient to our Anticoagulation Clinic and a 30 day supply of Warfarin was sent in for the pt since we will not know what dose the pt will need until we start managing the Warfarin.  Called the pharmacy and made them aware that the pt is new to [facility] and he will be started on the med on 04/17/19 while overlapping with Eliquis for 3 days then start Warfarin only. She is aware that he will need the 30 day supply filled by 04/17/2019 and to discontinue Eliquis from the [place] since it was still active on their end. She verbalized understanding.

## 2019-04-08 ENCOUNTER — Telehealth: Payer: Self-pay | Admitting: Nurse Practitioner

## 2019-04-08 NOTE — Telephone Encounter (Signed)
   I received a call from [employer] this AM.  Pt w/ 11 beat run of VT overnight.  Pt was contacted by service  asymptomatic and likely sleeping.  Pt being monitored for recent syncopal spell.  Strips to be faxed to office.  Murray Hodgkins, NP 04/08/2019, 11:02 AM

## 2019-04-10 ENCOUNTER — Telehealth: Payer: Self-pay | Admitting: *Deleted

## 2019-04-10 NOTE — Telephone Encounter (Signed)
Per Dr Marlou Porch continue to monitor .Cody Thomas

## 2019-04-10 NOTE — Telephone Encounter (Signed)
See other phone note re 1 serious event   Occurring 04/08/19 at 6:45 am CST Sinus rhythm w/1st degree AV Block/run of V-tach( 6 beats) PVC's (1 in 1) PACs 2 nd episode 04/08/19 at 8:00 am CST Sinus Rhythm w/ 1st degree AV Block/run of V-tach (11 beats) 3rd  Episode critical 04/09/19 at 11:40 am CST  Ventricular  (6 sec) Sinus Rhythm w/st degree AV Block ./cy Will discuss with Dr Marlou Porch .Adonis Housekeeper

## 2019-04-14 ENCOUNTER — Telehealth: Payer: Self-pay | Admitting: Internal Medicine

## 2019-04-14 ENCOUNTER — Telehealth: Payer: Self-pay

## 2019-04-14 DIAGNOSIS — E1122 Type 2 diabetes mellitus with diabetic chronic kidney disease: Secondary | ICD-10-CM | POA: Diagnosis not present

## 2019-04-14 DIAGNOSIS — E7800 Pure hypercholesterolemia, unspecified: Secondary | ICD-10-CM | POA: Diagnosis not present

## 2019-04-14 DIAGNOSIS — Z0000 Encounter for general adult medical examination without abnormal findings: Secondary | ICD-10-CM | POA: Diagnosis not present

## 2019-04-14 DIAGNOSIS — I10 Essential (primary) hypertension: Secondary | ICD-10-CM | POA: Diagnosis not present

## 2019-04-14 NOTE — Telephone Encounter (Signed)
Cardiology Moonlighter Note  Returned page from [employer]. Patient had 9 second run of NSVT w rate of 168. Was symptomatic at the time. No syncope. Currently in NSR and asymptomatic.   Marcie Mowers, MD Cardiology Fellow, PGY-7

## 2019-04-14 NOTE — Telephone Encounter (Signed)
Critical Cardiac Report received by [employer]. Dr. Marlou Porch reviewed and advises to continue monitoring at this time.

## 2019-04-17 ENCOUNTER — Telehealth: Payer: Self-pay

## 2019-04-17 ENCOUNTER — Telehealth: Payer: Self-pay | Admitting: *Deleted

## 2019-04-17 ENCOUNTER — Telehealth: Payer: Self-pay | Admitting: Physician Assistant

## 2019-04-17 NOTE — Telephone Encounter (Signed)
Received report from overnight fellow that pt had a 14 sec of NSVT overnight. Unclear what time.   I was able to speak with the patient and he was sleeping well overnight. Asymptomatic.

## 2019-04-17 NOTE — Telephone Encounter (Signed)
Received monitor alert from [employer] from 04/16/2019 at 1047pm, alert shows VT with 1st degree AV block.  Attempted to contact pt on home phone with no answer and left voicemail message on mobile voicemail to contact RN at [phone].  Alert reviewed by Dr Blima Rich and signed with no additional instructions for pt at this time.

## 2019-04-17 NOTE — Telephone Encounter (Signed)
Spoke with pt and advised of alert received from [employer] monitoring for 04/16/2019 at 813pm.  Pt states he was awake and is unaware of any symptoms during that time.  Pt states he feels great this morning.  Pt advised continue current medications and follow up as scheduled.  Pt verbalizes understanding and agrees with plan.

## 2019-04-17 NOTE — Telephone Encounter (Signed)
Called and spoke with patient and he has a conflict with Friday's Anticoagulation appt. Pt will start Warfarin tonight and overlap with Eliquis for 3 days-had to go over this with the patient twice. Also, rescheduled appt until Monday and made him aware that the appt will be at least 30 minutes for his first appt. He was thankful and will see [place] on 04/24/2019 at 1030am.

## 2019-04-18 ENCOUNTER — Telehealth: Payer: Self-pay | Admitting: *Deleted

## 2019-04-18 NOTE — Telephone Encounter (Signed)
Called patient due to [employer] Cardiac Report Day 20 04/17/19 10:38 pm CST Report Analysis:  Sinus Rhythm w/ 1st degree AV Block/Run of V-Tach (5 beat)  The patient reports he would have been getting ready to lay down for bed around that time and he does not recall feeling any unusual symptoms.  Reviewed with Dr. Radford Pax (DOD)  0 has had several NSVT asymptomatic, continue to monitor. Report signed and will be scanned into chart for further review.

## 2019-04-24 ENCOUNTER — Other Ambulatory Visit: Payer: Self-pay

## 2019-04-24 ENCOUNTER — Ambulatory Visit (INDEPENDENT_AMBULATORY_CARE_PROVIDER_SITE_OTHER): Payer: Medicare Other | Admitting: *Deleted

## 2019-04-24 DIAGNOSIS — Z5181 Encounter for therapeutic drug level monitoring: Secondary | ICD-10-CM | POA: Diagnosis not present

## 2019-04-24 DIAGNOSIS — I480 Paroxysmal atrial fibrillation: Secondary | ICD-10-CM

## 2019-04-24 DIAGNOSIS — Z7901 Long term (current) use of anticoagulants: Secondary | ICD-10-CM | POA: Insufficient documentation

## 2019-04-24 LAB — POCT INR: INR: 1.2 — AB (ref 2.0–3.0)

## 2019-04-24 NOTE — Patient Instructions (Addendum)
A full discussion of the nature of anticoagulants has been carried out.  A benefit risk analysis has been presented to the patient, so that they understand the justification for choosing anticoagulation at this time. The need for frequent and regular monitoring, precise dosage adjustment and compliance is stressed.  Side effects of potential bleeding are discussed.  The patient should avoid any OTC items containing aspirin or ibuprofen, and should avoid great swings in general diet.  Avoid alcohol consumption.  Call if any signs of abnormal bleeding.  Description   Start taking 1.5 tablets daily except for 1 tablet on Sundays, Wednesdays, and Fridays.  Recheck INR in 1 week. Anticoagulation Clinic [phone], Main Number [phone]

## 2019-05-01 ENCOUNTER — Other Ambulatory Visit: Payer: Self-pay

## 2019-05-01 ENCOUNTER — Ambulatory Visit: Payer: Medicare Other | Admitting: Pharmacist

## 2019-05-01 DIAGNOSIS — I480 Paroxysmal atrial fibrillation: Secondary | ICD-10-CM | POA: Diagnosis not present

## 2019-05-01 LAB — POCT INR: INR: 1.8 — AB (ref 2.0–3.0)

## 2019-05-01 NOTE — Patient Instructions (Signed)
Description   Take 2 tablets today, then start taking 1.5 tablets daily except for 1 tablet on Sundays and Wednesdays.  Recheck INR in 1 week. Anticoagulation Clinic [phone], Main Number [phone]

## 2019-05-08 ENCOUNTER — Other Ambulatory Visit: Payer: Self-pay | Admitting: Cardiology

## 2019-05-08 ENCOUNTER — Ambulatory Visit: Payer: Medicare Other | Admitting: Pharmacist

## 2019-05-08 ENCOUNTER — Other Ambulatory Visit: Payer: Self-pay

## 2019-05-08 DIAGNOSIS — I480 Paroxysmal atrial fibrillation: Secondary | ICD-10-CM | POA: Diagnosis not present

## 2019-05-08 LAB — POCT INR: INR: 3.1 — AB (ref 2.0–3.0)

## 2019-05-08 MED ORDER — WARFARIN SODIUM 5 MG PO TABS: ORAL_TABLET | ORAL | 0 refills | Status: DC

## 2019-05-08 NOTE — Patient Instructions (Signed)
Description   Start taking 1.5 tablets daily except for 1 tablet on Mondays and Wednesdays and Fridays.  Recheck INR in 1 week. Anticoagulation Clinic [phone], Main Number [phone]

## 2019-05-09 ENCOUNTER — Other Ambulatory Visit: Payer: Self-pay

## 2019-05-09 ENCOUNTER — Telehealth: Payer: Self-pay | Admitting: Endocrinology

## 2019-05-09 MED ORDER — ACCU-CHEK GUIDE VI STRP: ORAL_STRIP | 2 refills | Status: DC

## 2019-05-09 NOTE — Telephone Encounter (Signed)
MEDICATION: accu-chek test strips  PHARMACY: [facility]. Ph# [phone]  IS THIS A 90 DAY SUPPLY : yes  IS PATIENT OUT OF MEDICATION: yes  IF NOT; HOW MUCH IS LEFT:   LAST APPOINTMENT DATE: 01/12/2019  NEXT APPOINTMENT DATE: 05/18/2019  DO WE HAVE YOUR PERMISSION TO LEAVE A DETAILED MESSAGE: yes  OTHER COMMENTS: Patient called stating that he already has a meter and everything he needs for accu-chek, he said his PCP is retiring and told patient to please contact Dr Dwyane Dee for refills for test strips since his PCP is normally the one who calls them in. Patient only wants test strips sent to [facility] - all other RX sent through [facility].   **Let patient know to contact pharmacy at the end of the day to make sure medication is ready. **  ** Please notify patient to allow 48-72 hours to process**  **Encourage patient to contact the pharmacy for refills or they can request refills through [facility]**

## 2019-05-09 NOTE — Telephone Encounter (Signed)
Rx sent 

## 2019-05-10 ENCOUNTER — Other Ambulatory Visit: Payer: Self-pay

## 2019-05-10 MED ORDER — GLUCOSE BLOOD VI STRP: 1.0000 | ORAL_STRIP | Freq: Every day | 3 refills | Status: DC

## 2019-05-11 ENCOUNTER — Other Ambulatory Visit: Payer: Self-pay

## 2019-05-11 MED ORDER — GLUCOSE BLOOD VI STRP: ORAL_STRIP | 3 refills | Status: DC

## 2019-05-15 ENCOUNTER — Other Ambulatory Visit: Payer: Self-pay

## 2019-05-15 ENCOUNTER — Ambulatory Visit: Payer: Medicare Other

## 2019-05-15 ENCOUNTER — Other Ambulatory Visit (INDEPENDENT_AMBULATORY_CARE_PROVIDER_SITE_OTHER): Payer: Medicare Other

## 2019-05-15 DIAGNOSIS — E669 Obesity, unspecified: Secondary | ICD-10-CM

## 2019-05-15 DIAGNOSIS — I480 Paroxysmal atrial fibrillation: Secondary | ICD-10-CM

## 2019-05-15 DIAGNOSIS — E1169 Type 2 diabetes mellitus with other specified complication: Secondary | ICD-10-CM

## 2019-05-15 LAB — COMPREHENSIVE METABOLIC PANEL
ALT: 15 U/L (ref 0–53)
AST: 21 U/L (ref 0–37)
Albumin: 3.9 g/dL (ref 3.5–5.2)
Alkaline Phosphatase: 46 U/L (ref 39–117)
BUN: 28 mg/dL — ABNORMAL HIGH (ref 6–23)
CO2: 26 mEq/L (ref 19–32)
Calcium: 9.3 mg/dL (ref 8.4–10.5)
Chloride: 104 mEq/L (ref 96–112)
Creatinine, Ser: 1.61 mg/dL — ABNORMAL HIGH (ref 0.40–1.50)
GFR: 40.84 mL/min — ABNORMAL LOW (ref >60.00)
Glucose, Bld: 105 mg/dL — ABNORMAL HIGH (ref 70–99)
Potassium: 4.2 mEq/L (ref 3.5–5.1)
Sodium: 139 mEq/L (ref 135–145)
Total Bilirubin: 0.6 mg/dL (ref 0.2–1.2)
Total Protein: 6.9 g/dL (ref 6.0–8.3)

## 2019-05-15 LAB — POCT INR: INR: 3 (ref 2.0–3.0)

## 2019-05-15 LAB — MICROALBUMIN / CREATININE URINE RATIO
Creatinine,U: 146.2 mg/dL
Microalb Creat Ratio: 0.5 mg/g (ref 0.0–30.0)
Microalb, Ur: <0.7 mg/dL (ref 0.0–1.9)

## 2019-05-15 LAB — HEMOGLOBIN A1C: Hgb A1c MFr Bld: 6.3 % (ref 4.6–6.5)

## 2019-05-15 NOTE — Patient Instructions (Signed)
Description   Continue on same dosage 1.5 tablets daily except for 1 tablet on Mondays, Wednesdays and Fridays.  Recheck INR in 1 week. Anticoagulation Clinic [phone], Main Number [phone]

## 2019-05-17 ENCOUNTER — Other Ambulatory Visit: Payer: Self-pay

## 2019-05-17 ENCOUNTER — Ambulatory Visit (INDEPENDENT_AMBULATORY_CARE_PROVIDER_SITE_OTHER): Payer: Medicare Other | Admitting: Podiatry

## 2019-05-17 ENCOUNTER — Encounter: Payer: Self-pay | Admitting: Podiatry

## 2019-05-17 VITALS — Temp 97.4°F

## 2019-05-17 DIAGNOSIS — B351 Tinea unguium: Secondary | ICD-10-CM

## 2019-05-17 DIAGNOSIS — M2011 Hallux valgus (acquired), right foot: Secondary | ICD-10-CM | POA: Diagnosis not present

## 2019-05-17 DIAGNOSIS — M79674 Pain in right toe(s): Secondary | ICD-10-CM | POA: Diagnosis not present

## 2019-05-17 DIAGNOSIS — E119 Type 2 diabetes mellitus without complications: Secondary | ICD-10-CM

## 2019-05-17 DIAGNOSIS — M2012 Hallux valgus (acquired), left foot: Secondary | ICD-10-CM

## 2019-05-17 DIAGNOSIS — M79675 Pain in left toe(s): Secondary | ICD-10-CM | POA: Diagnosis not present

## 2019-05-17 NOTE — Patient Instructions (Signed)
Diabetes Mellitus and Foot Care Foot care is an important part of your health, especially when you have diabetes. Diabetes may cause you to have problems because of poor blood flow (circulation) to your feet and legs, which can cause your skin to:  Become thinner and drier.  Break more easily.  Heal more slowly.  Peel and crack. You may also have nerve damage (neuropathy) in your legs and feet, causing decreased feeling in them. This means that you may not notice minor injuries to your feet that could lead to more serious problems. Noticing and addressing any potential problems early is the best way to prevent future foot problems. How to care for your feet Foot hygiene  Wash your feet daily with warm water and mild soap. Do not use hot water. Then, pat your feet and the areas between your toes until they are completely dry. Do not soak your feet as this can dry your skin.  Trim your toenails straight across. Do not dig under them or around the cuticle. File the edges of your nails with an emery board or nail file.  Apply a moisturizing lotion or petroleum jelly to the skin on your feet and to dry, brittle toenails. Use lotion that does not contain alcohol and is unscented. Do not apply lotion between your toes. Shoes and socks  Wear clean socks or stockings every day. Make sure they are not too tight. Do not wear knee-high stockings since they may decrease blood flow to your legs.  Wear shoes that fit properly and have enough cushioning. Always look in your shoes before you put them on to be sure there are no objects inside.  To break in new shoes, wear them for just a few hours a day. This prevents injuries on your feet. Wounds, scrapes, corns, and calluses  Check your feet daily for blisters, cuts, bruises, sores, and redness. If you cannot see the bottom of your feet, use a mirror or ask someone for help.  Do not cut corns or calluses or try to remove them with medicine.  If you  find a minor scrape, cut, or break in the skin on your feet, keep it and the skin around it clean and dry. You may clean these areas with mild soap and water. Do not clean the area with peroxide, alcohol, or iodine.  If you have a wound, scrape, corn, or callus on your foot, look at it several times a day to make sure it is healing and not infected. Check for: ? Redness, swelling, or pain. ? Fluid or blood. ? Warmth. ? Pus or a bad smell. General instructions  Do not cross your legs. This may decrease blood flow to your feet.  Do not use heating pads or hot water bottles on your feet. They may burn your skin. If you have lost feeling in your feet or legs, you may not know this is happening until it is too late.  Protect your feet from hot and cold by wearing shoes, such as at the beach or on hot pavement.  Schedule a complete foot exam at least once a year (annually) or more often if you have foot problems. If you have foot problems, report any cuts, sores, or bruises to your health care provider immediately. Contact a health care provider if:  You have a medical condition that increases your risk of infection and you have any cuts, sores, or bruises on your feet.  You have an injury that is not   healing.  You have redness on your legs or feet.  You feel burning or tingling in your legs or feet.  You have pain or cramps in your legs and feet.  Your legs or feet are numb.  Your feet always feel cold.  You have pain around a toenail. Get help right away if:  You have a wound, scrape, corn, or callus on your foot and: ? You have pain, swelling, or redness that gets worse. ? You have fluid or blood coming from the wound, scrape, corn, or callus. ? Your wound, scrape, corn, or callus feels warm to the touch. ? You have pus or a bad smell coming from the wound, scrape, corn, or callus. ? You have a fever. ? You have a red line going up your leg. Summary  Check your feet every day  for cuts, sores, red spots, swelling, and blisters.  Moisturize feet and legs daily.  Wear shoes that fit properly and have enough cushioning.  If you have foot problems, report any cuts, sores, or bruises to your health care provider immediately.  Schedule a complete foot exam at least once a year (annually) or more often if you have foot problems. This information is not intended to replace advice given to you by your health care provider. Make sure you discuss any questions you have with your health care provider. Document Revised: 11/09/2018 Document Reviewed: 03/20/2016 Elsevier Patient Education  2020 [employer]vier Inc.  

## 2019-05-18 ENCOUNTER — Encounter: Payer: Self-pay | Admitting: Endocrinology

## 2019-05-18 ENCOUNTER — Ambulatory Visit: Payer: Medicare Other | Admitting: Endocrinology

## 2019-05-18 VITALS — BP 130/70 | HR 65 | Ht 64.0 in | Wt 179.8 lb

## 2019-05-18 DIAGNOSIS — E1169 Type 2 diabetes mellitus with other specified complication: Secondary | ICD-10-CM | POA: Diagnosis not present

## 2019-05-18 DIAGNOSIS — E669 Obesity, unspecified: Secondary | ICD-10-CM

## 2019-05-18 DIAGNOSIS — I10 Essential (primary) hypertension: Secondary | ICD-10-CM

## 2019-05-18 DIAGNOSIS — E7800 Pure hypercholesterolemia, unspecified: Secondary | ICD-10-CM

## 2019-05-18 DIAGNOSIS — N289 Disorder of kidney and ureter, unspecified: Secondary | ICD-10-CM | POA: Diagnosis not present

## 2019-05-18 NOTE — Progress Notes (Signed)
Patient ID: Cody Thomas, male   DOB: 1930-11-27, 86 y.o.   MRN: [id number]   Reason for Appointment: Diabetes follow-up   History of Present Illness   Diagnosis: Type 2 DIABETES MELITUS  He has had mild diabetes which has been previously erratic partly because of dietary inconsistency and difficulty with weight loss He has not been continued on metformin because of renal dysfunction Was given low-dose Januvia instead but this was too expensive for him even though it was helping his control In 2014 he was given low dose Amaryl instead which appears to be keeping his blood sugars controlled  His blood sugars were not well controlled when he was seen in 1/16 with A1c 7.8  Subsequently Amaryl was stopped partly because of mild hypoglycemia  Recent history:  Since 1/16 he has been on metformin ER and he is  taking 500 mg twice daily  A1c is stable at 6.3  Previous range 6.2-8 . Current blood sugar patterns, problems and management:  He did not have a refill for his test trips and only started checking this week  He lost weight since his last visit because of having a Covid infection  He is usually not very active in winter months but usually does more when spring and summer  He is taking his Metformin consistently twice a day  However his creatinine is relatively higher now  He still tries to eat small portions  No nausea with Metformin  His blood sugar in the lab was 105 fasting  Checking blood sugars about every other day on an average as below  Monitors blood glucose: Once a day or less .    Glucometer:   Accu-Chek         Blood Glucose readings and averages from download   PRE-MEAL Fasting Lunch Dinner Bedtime Overall  Glucose range:  98   101    Mean/median:     115   POST-MEAL PC Breakfast PC Lunch PC Dinner  Glucose range:  117   143  Mean/median:       Previously:  PRE-MEAL Fasting  midday Dinner Bedtime Overall  Glucose range:  101-120   146, 191  149  111-175   Mean/median:     142 133     Dietitian consultation last done several years ago  Breakfast meals: Sometimes Cheerios cereal, oatmeal, sometimes an egg, toast and apple butter May have cookies for snacks   Wt Readings from Last 3 Encounters:  05/18/19 179 lb 12.8 oz (81.6 kg)  04/03/19 175 lb (79.4 kg)  03/19/19 182 lb (82.6 kg)   DM LABS:  Lab Results  Component Value Date   HGBA1C 6.3 05/15/2019   HGBA1C 6.4 01/10/2019   HGBA1C 6.3 10/06/2018   Lab Results  Component Value Date   MICROALBUR <0.7 05/15/2019   LDLCALC 51 10/06/2018   CREATININE 1.61 (H) 05/15/2019   Anti-coag visit on 05/15/2019  Component Date Value Ref Range Status  . INR 05/15/2019 3.0  2.0 - 3.0 Final  Lab on 05/15/2019  Component Date Value Ref Range Status  . Microalb, Ur 05/15/2019 <0.7  0.0 - 1.9 mg/dL Final  . Creatinine,U 40/98/1191 146.2  mg/dL Final  . Microalb Creat Ratio 05/15/2019 0.5  0.0 - 30.0 mg/g Final  . Sodium 05/15/2019 139  135 - 145 mEq/L Final  . Potassium 05/15/2019 4.2  3.5 - 5.1 mEq/L Final  . Chloride 05/15/2019 104  96 - 112 mEq/L Final  . CO2  05/15/2019 26  19 - 32 mEq/L Final  . Glucose, Bld 05/15/2019 105* 70 - 99 mg/dL Final  . BUN 66/44/0347 28* 6 - 23 mg/dL Final  . Creatinine, Ser 05/15/2019 1.61* 0.40 - 1.50 mg/dL Final  . Total Bilirubin 05/15/2019 0.6  0.2 - 1.2 mg/dL Final  . Alkaline Phosphatase 05/15/2019 46  39 - 117 U/L Final  . AST 05/15/2019 21  0 - 37 U/L Final  . ALT 05/15/2019 15  0 - 53 U/L Final  . Total Protein 05/15/2019 6.9  6.0 - 8.3 g/dL Final  . Albumin 42/59/5638 3.9  3.5 - 5.2 g/dL Final  . GFR 75/64/3329 40.84* >60.00 mL/min Final  . Calcium 05/15/2019 9.3  8.4 - 10.5 mg/dL Final  . Hgb J1O MFr Bld 05/15/2019 6.3  4.6 - 6.5 % Final   Glycemic Control Guidelines for People with Diabetes:Non Diabetic:  <6%Goal of Therapy: <7%Additional Action Suggested:  >8%      Allergies as of 05/18/2019      Reactions    Crestor [rosuvastatin]    Nightmares in high dosage   Sertraline Diarrhea   Zocor [simvastatin]    Nightmares in high dosage      Medication List       Accurate as of May 18, 2019 10:06 AM. If you have any questions, ask your nurse or doctor.        b complex vitamins capsule Take 1 capsule by mouth daily.   calcium carbonate 500 MG chewable tablet Commonly known as: TUMS - dosed in mg elemental calcium Chew 2 tablets by mouth daily as needed. For indigestion.   Coenzyme Q10-Vitamin E 100-1 MG-UNT/5ML Syrp Take 100 mg by mouth daily. Take 2 tablespoons (100MG ) Daily   fish oil-omega-3 fatty acids 1000 MG capsule Take 1,200 mg by mouth daily. 4 1200 tablets daily   fluticasone 50 MCG/ACT nasal spray Commonly known as: FLONASE Place 1 spray into both nostrils daily as needed for allergies or rhinitis.   glucose blood test strip Use Accu Chek Smart test strips as instructed to check blood sugar once daily. DX:E11.65   LORazepam 0.5 MG tablet Commonly known as: ATIVAN Take 0.25-0.5 mg by mouth daily as needed for anxiety.   losartan-hydrochlorothiazide 100-25 MG tablet Commonly known as: HYZAAR TAKE 1 TABLET BY MOUTH EVERY DAY   metFORMIN 500 MG 24 hr tablet Commonly known as: GLUCOPHAGE-XR TAKE 1 TABLET BY MOUTH TWICE DAILY   metoprolol tartrate 25 MG tablet Commonly known as: LOPRESSOR TAKE 1 TABLET BY MOUTH TWICE DAILY   rosuvastatin 20 MG tablet Commonly known as: CRESTOR Take 20 mg by mouth See admin instructions. Take 1 tablet by mouth three times weekly.   traMADol 50 MG tablet Commonly known as: ULTRAM TAKE 1 TABLET BY MOUTH EVERY 6 HOURS AS NEEDED   warfarin 5 MG tablet Commonly known as: COUMADIN Take as directed by the anticoagulation clinic. If you are unsure how to take this medication, talk to your nurse or doctor. Original instructions: Take 1-1.5 tablets daily as directed by coumadin clinic       Allergies:  Allergies  Allergen  Reactions  . Crestor [Rosuvastatin]     Nightmares in high dosage  . Sertraline Diarrhea  . Zocor [Simvastatin]     Nightmares in high dosage    Past Medical History:  Diagnosis Date  . Anxiety   . Bunion 09/29/2011  . Cancer (HCC)    nose/Dr. Albertini;skin  . Chronic constipation   . Chronic  kidney disease    stage 111  . Coronary atherosclerosis of native coronary artery    2009 LAD CIRC DES  . Depression    doesn't take any meds for this  . Diabetes mellitus    takes Januvia daily  . Dizziness   . Enlarged prostate    but doesn't require meds at present  . GERD (gastroesophageal reflux disease)    TUMS prn  . H/O hiatal hernia   . Headache(784.0)   . History of gout    doesn't require meds  . Hx of cardiac cath   . Hyperlipidemia    Crestor 3 x wk and Zetia daily  . Hypertension    takes Hyzaar daily  . Insomnia    doesn't require meds at present time  . Joint pain   . Onychomycosis 10/05/2012   x 10  . PONV (postoperative nausea and vomiting)    pt states extremely sick  . Shortness of breath    with exertion    Past Surgical History:  Procedure Laterality Date  . bladder cancer    . CARDIAC CATHETERIZATION  2013  . cataracts removed    . CHOLECYSTECTOMY    . COLONOSCOPY    . CORONARY ANGIOPLASTY     2 stents from 2009  . CORONARY ARTERY BYPASS GRAFT  10/29/2011   Procedure: CORONARY ARTERY BYPASS GRAFTING (CABG);  Surgeon: Kerin Perna, MD;  Location: [facility] OR;  Service: Open Heart Surgery;  Laterality: N/A;  . cyst removed from finger     right hand  . EYE SURGERY     bilateral cataracts  . HEMORRHOID SURGERY    . HERNIA REPAIR     x 2;inguinal  . KIDNEY STONE SURGERY    . LEFT HEART CATHETERIZATION WITH CORONARY ANGIOGRAM N/A 10/21/2011   Procedure: LEFT HEART CATHETERIZATION WITH CORONARY ANGIOGRAM;  Surgeon: Donato Schultz, MD;  Location: [facility] CATH LAB;  Service: Cardiovascular;  Laterality: N/A;  . rotator cuff surgery     right   . skin  cancer removed      Family History  Problem Relation Age of Onset  . Heart disease Mother   . Stroke Father   . Cancer Brother     Social History:  reports that he quit smoking about 35 years ago. His smoking use included cigarettes. He has a 45.00 pack-year smoking history. He has never used smokeless tobacco. He reports that he does not drink alcohol or use drugs.  Review of Systems:  HYPERTENSION:  followed by PCP and cardiologist and blood pressure is ok  He checks his blood pressure at home regularly and is ranging from 130/60-70 upto 160 systolic  BP Readings from Last 3 Encounters:  05/18/19 130/70  04/03/19 (!) 144/80  03/20/19 (!) 115/100    RENAL dysfunction: Has mild persistent increase in creatinine which appears slightly higher:  Lab Results  Component Value Date   CREATININE 1.61 (H) 05/15/2019   CREATININE 1.3 04/06/2019   CREATININE 1.30 (H) 03/20/2019     HYPERLIPIDEMIA: The lipid abnormality consists of elevated LDL and low HDL treated with Crestor  Currently taking Crestor 20 mg Levels are as below  Lipids followed by PCP and cardiologist    Lab Results  Component Value Date   CHOL 116 10/06/2018   HDL 34 (A) 10/06/2018   LDLCALC 51 10/06/2018   TRIG 158 10/06/2018   CHOLHDL 3 05/05/2018    Thyroid nodule: This has been stable and small with previous evaluations  Had a follow-up up  ultrasound in December 2017 done by his PCP, this was stable He also has a 12 mm probably pseudo-nodule present     Examination:   BP 130/70   Pulse 65   Ht 5\' 4"  (1.626 m)   Wt 179 lb 12.8 oz (81.6 kg)   SpO2 93%   BMI 30.86 kg/m   Body mass index is 30.86 kg/m.  Marland Kitchen Thyroid nonpalpable  ASSESSMENT/ PLAN:   Diabetes type 2 with mild obesity   See history of present illness for detailed discussion of current diabetes management, blood sugar patterns and problems identified  A1c is stable a 6.3  He has been on metformin monotherapy, taking 1000  mg daily long-term His weight has come down from recent Covid infection Otherwise considering his age is doing well with his level of control He tries to be active during spring and summer and likely will continue to have good control  To continue same regimen for now, may need to adjust Metformin based on renal function   HYPERTENSION: Blood pressure is well controlled  History of thyroid nodule: This is not clinically palpable, normal exam today   Renal function: Currently his GFR is about 40 and not clear why this is recently worse  Discussed that we may need to potentially reduce his Metformin but currently is tolerating this well without any nausea, lethargy or weakness He needs follow-up, no obvious reason for renal insufficiency including use of OTC nonsteroidal drugs Meanwhile asked him to increase his fluid intake in general and follow-up with his PCP for repeat testing in the next 1 to 2 months   Follow-up in 3 months     There are no Patient Instructions on file for this visit.    Reather Littler 05/18/2019, 10:06 AM

## 2019-05-18 NOTE — Patient Instructions (Signed)
Check blood sugars on waking up 2 days a week  Also check blood sugars about 2 hours after meals and do this after different meals by rotation  Recommended blood sugar levels on waking up are 90-130 and about 2 hours after meal is 130-160  Please bring your blood sugar monitor to each visit, thank you  Drink 8 glasses of water

## 2019-05-22 ENCOUNTER — Other Ambulatory Visit: Payer: Self-pay

## 2019-05-22 ENCOUNTER — Ambulatory Visit: Payer: Medicare Other | Admitting: *Deleted

## 2019-05-22 DIAGNOSIS — N200 Calculus of kidney: Secondary | ICD-10-CM | POA: Diagnosis not present

## 2019-05-22 DIAGNOSIS — Z5181 Encounter for therapeutic drug level monitoring: Secondary | ICD-10-CM

## 2019-05-22 DIAGNOSIS — I480 Paroxysmal atrial fibrillation: Secondary | ICD-10-CM | POA: Diagnosis not present

## 2019-05-22 DIAGNOSIS — N400 Enlarged prostate without lower urinary tract symptoms: Secondary | ICD-10-CM | POA: Diagnosis not present

## 2019-05-22 DIAGNOSIS — Z8551 Personal history of malignant neoplasm of bladder: Secondary | ICD-10-CM | POA: Diagnosis not present

## 2019-05-22 LAB — POCT INR: INR: 2.1 (ref 2.0–3.0)

## 2019-05-22 NOTE — Patient Instructions (Signed)
Description   Continue on same dosage 1.5 tablets daily except for 1 tablet on Mondays, Wednesdays and Fridays.  Recheck INR in 1 week. Anticoagulation Clinic [phone], Main Number [phone]

## 2019-05-22 NOTE — Progress Notes (Signed)
Subjective: Cody Thomas presents today for preventative diabetic foot care, for diabetic foot evaluation and painful mycotic nails b/l that are difficult to trim. Pain interferes with ambulation. Aggravating factors include wearing enclosed shoe gear. Pain is relieved with periodic professional debridement.  He states he has a mild case of COVID in January. He has recovered.   He voices no new pedal problems on today's visit.  Past Medical History:  Diagnosis Date  . Anxiety   . Bunion 09/29/2011  . Cancer (HCC)    nose/Dr. Albertini;skin  . Chronic constipation   . Chronic kidney disease    stage 111  . Coronary atherosclerosis of native coronary artery    2009 LAD CIRC DES  . Depression    doesn't take any meds for this  . Diabetes mellitus    takes Januvia daily  . Dizziness   . Enlarged prostate    but doesn't require meds at present  . GERD (gastroesophageal reflux disease)    TUMS prn  . H/O hiatal hernia   . Headache(784.0)   . History of gout    doesn't require meds  . Hx of cardiac cath   . Hyperlipidemia    Crestor 3 x wk and Zetia daily  . Hypertension    takes Hyzaar daily  . Insomnia    doesn't require meds at present time  . Joint pain   . Onychomycosis 10/05/2012   x 10  . PONV (postoperative nausea and vomiting)    pt states extremely sick  . Shortness of breath    with exertion    Patient Active Problem List   Diagnosis Date Noted  . Long term (current) use of anticoagulants 04/24/2019  . Near syncope 03/19/2019  . PAF (paroxysmal atrial fibrillation) ([place]) 03/19/2019  . Atrial fibrillation ([place]) 03/18/2019  . OSA (obstructive sleep apnea) 12/14/2016  . Obese 12/14/2016  . Degenerative disc disease, lumbar 02/13/2016  . Chronic left-sided low back pain with left-sided sciatica 02/13/2016  . Left thyroid nodule 01/21/2015  . Pulmonary nodules/lesions, multiple 10/30/2014  . CKD (chronic kidney disease) stage 3, GFR 30-59 ml/min  09/21/2014  . Arcus senilis of both eyes 09/06/2014  . Ectropion of right eye 09/06/2014  . Bladder cancer ([place]) 08/09/2014  . Recurrent nephrolithiasis 04/23/2014  . Benign nodular prostatic hyperplasia with lower urinary tract symptoms 10/30/2013  . Type II or unspecified type diabetes mellitus without mention of complication, not stated as uncontrolled 10/20/2012  . Onychomycosis 10/05/2012  . Chest pain, unspecified 10/21/2011  . Hypertension   . Hyperlipidemia   . Hx of cardiac cath   . Coronary atherosclerosis of native coronary artery   . Family history of malignant neoplasm of prostate 10/19/2011    Past Surgical History:  Procedure Laterality Date  . bladder cancer    . CARDIAC CATHETERIZATION  2013  . cataracts removed    . CHOLECYSTECTOMY    . COLONOSCOPY    . CORONARY ANGIOPLASTY     2 stents from 2009  . CORONARY ARTERY BYPASS GRAFT  10/29/2011   Procedure: CORONARY ARTERY BYPASS GRAFTING (CABG);  Surgeon: Ivin Poot, MD;  Location: [place];  Service: Open Heart Surgery;  Laterality: N/A;  . cyst removed from finger     right hand  . EYE SURGERY     bilateral cataracts  . HEMORRHOID SURGERY    . HERNIA REPAIR     x 2;inguinal  . KIDNEY STONE SURGERY    . LEFT HEART CATHETERIZATION  WITH CORONARY ANGIOGRAM N/A 10/21/2011   Procedure: LEFT HEART CATHETERIZATION WITH CORONARY ANGIOGRAM;  Surgeon: Candee Furbish, MD;  Location: [facility] CATH LAB;  Service: Cardiovascular;  Laterality: N/A;  . rotator cuff surgery     right   . skin cancer removed      Current Outpatient Medications on File Prior to Visit  Medication Sig Dispense Refill  . b complex vitamins capsule Take 1 capsule by mouth daily.    . calcium carbonate (TUMS - DOSED IN MG ELEMENTAL CALCIUM) 500 MG chewable tablet Chew 2 tablets by mouth daily as needed. For indigestion.     . Coenzyme Q10-Vitamin E 100-1 MG-UNT/5ML SYRP Take 100 mg by mouth daily. Take 2 tablespoons (100MG ) Daily    . fish oil-omega-3  fatty acids 1000 MG capsule Take 1,200 mg by mouth daily. 4 1200 tablets daily    . fluticasone (FLONASE) 50 MCG/ACT nasal spray Place 1 spray into both nostrils daily as needed for allergies or rhinitis.    Marland Kitchen glucose blood test strip Use Accu Chek Smart test strips as instructed to check blood sugar once daily. DX:E11.65 100 each 3  . LORazepam (ATIVAN) 0.5 MG tablet Take 0.25-0.5 mg by mouth daily as needed for anxiety.   0  . losartan-hydrochlorothiazide (HYZAAR) 100-25 MG tablet TAKE 1 TABLET BY MOUTH EVERY DAY 90 tablet 3  . metFORMIN (GLUCOPHAGE-XR) 500 MG 24 hr tablet TAKE 1 TABLET BY MOUTH TWICE DAILY 180 tablet 1  . metoprolol tartrate (LOPRESSOR) 25 MG tablet TAKE 1 TABLET BY MOUTH TWICE DAILY 180 tablet 3  . rosuvastatin (CRESTOR) 20 MG tablet Take 20 mg by mouth See admin instructions. Take 1 tablet by mouth three times weekly.    . traMADol (ULTRAM) 50 MG tablet TAKE 1 TABLET BY MOUTH EVERY 6 HOURS AS NEEDED 30 tablet 0  . warfarin (COUMADIN) 5 MG tablet Take 1-1.5 tablets daily as directed by coumadin clinic 40 tablet 0   No current facility-administered medications on file prior to visit.     Allergies  Allergen Reactions  . Crestor [Rosuvastatin]     Nightmares in high dosage  . Sertraline Diarrhea  . Zocor [Simvastatin]     Nightmares in high dosage    Social History   Occupational History  . Occupation: Retired  Tobacco Use  . Smoking status: Former Smoker    Packs/day: 1.50    Years: 30.00    Pack years: 45.00    Types: Cigarettes    Quit date: 03/02/1984    Years since quitting: 35.2  . Smokeless tobacco: Never Used  Substance and Sexual Activity  . Alcohol use: No    Alcohol/week: 0.0 standard drinks  . Drug use: No  . Sexual activity: Not Currently    Family History  Problem Relation Age of Onset  . Heart disease Mother   . Stroke Father   . Cancer Brother     Immunization History  Administered Date(s) Administered  . Influenza, High Dose  Seasonal PF 11/09/2016  . Influenza,inj,Quad PF,6+ Mos 01/19/2013  . Influenza-Unspecified 12/19/2013, 12/21/2014  . Pneumococcal Conjugate-13 10/29/2012  . Pneumococcal Polysaccharide-23 10/29/2009     Objective: Vitals:   05/17/19 1503  Temp: (!) 97.4 F (36.3 C)    Cody Thomas is a/an 86 y.o. Caucasian male  IN NAD. AAO X 3.  Capillary refill time to digits immediate b/l. Palpable DP pulses b/l. Palpable PT pulses b/l. Pedal hair present b/l. Skin temperature gradient within normal limits b/l.  Pedal skin is thin shiny, atrophic bilaterally. No open wounds bilaterally. No interdigital macerations bilaterally. Toenails 1-5 b/l elongated, dystrophic, thickened, crumbly with subungual debris and tenderness to dorsal palpation.  Normal muscle strength 5/5 to all lower extremity muscle groups bilaterally, no pain crepitus or joint limitation noted with ROM b/l and bunion deformity noted b/l  Protective sensation intact 5/5 intact bilaterally with 10g monofilament b/l Vibratory sensation intact b/l Proprioception intact bilaterally  Hemoglobin A1C Latest Ref Rng & Units 05/15/2019 01/10/2019 10/06/2018 09/05/2018  HGBA1C 4.6 - 6.5 % 6.3 6.4 6.3 6.6(H)  Some recent data might be hidden   1. Pain due to onychomycosis of toenails of both feet   2. Hallux valgus, acquired, bilateral   3. Controlled type 2 diabetes mellitus without complication, without long-term current use of insulin ([place])   4. Encounter for diabetic foot exam ([place])     -Diabetic foot examination performed on today's visit. -Continue diabetic foot care principles. Literature dispensed on today.  -Toenails 1-5 b/l were debrided in length and girth with sterile nail nippers and dremel without iatrogenic bleeding.  -Patient to continue soft, supportive shoe gear daily. -Patient to report any pedal injuries to medical professional immediately. -Patient/POA to call should there be question/concern in the  interim.  Return in about 3 months (around 08/17/2019) for diabetic nail trim.

## 2019-05-29 ENCOUNTER — Other Ambulatory Visit: Payer: Self-pay

## 2019-05-29 ENCOUNTER — Other Ambulatory Visit: Payer: Self-pay | Admitting: *Deleted

## 2019-05-29 ENCOUNTER — Ambulatory Visit: Payer: Medicare Other | Admitting: *Deleted

## 2019-05-29 DIAGNOSIS — Z5181 Encounter for therapeutic drug level monitoring: Secondary | ICD-10-CM

## 2019-05-29 DIAGNOSIS — I472 Ventricular tachycardia, unspecified: Secondary | ICD-10-CM

## 2019-05-29 DIAGNOSIS — I4892 Unspecified atrial flutter: Secondary | ICD-10-CM

## 2019-05-29 DIAGNOSIS — R55 Syncope and collapse: Secondary | ICD-10-CM

## 2019-05-29 DIAGNOSIS — I480 Paroxysmal atrial fibrillation: Secondary | ICD-10-CM | POA: Diagnosis not present

## 2019-05-29 LAB — POCT INR: INR: 2.1 (ref 2.0–3.0)

## 2019-05-29 NOTE — Patient Instructions (Addendum)
Description   Continue on same dosage 1.5 tablets daily except for 1 tablet on Mondays, Wednesdays and Fridays.  Recheck INR in 2 weeks. Anticoagulation Clinic [phone], Main Number [phone]

## 2019-06-12 ENCOUNTER — Other Ambulatory Visit: Payer: Self-pay | Admitting: Cardiology

## 2019-06-12 ENCOUNTER — Other Ambulatory Visit: Payer: Self-pay | Admitting: Pharmacist

## 2019-06-12 MED ORDER — WARFARIN SODIUM 5 MG PO TABS: ORAL_TABLET | ORAL | 1 refills | Status: DC

## 2019-06-13 ENCOUNTER — Other Ambulatory Visit: Payer: Self-pay

## 2019-06-13 ENCOUNTER — Ambulatory Visit: Payer: Medicare Other | Admitting: *Deleted

## 2019-06-13 DIAGNOSIS — I480 Paroxysmal atrial fibrillation: Secondary | ICD-10-CM

## 2019-06-13 DIAGNOSIS — Z5181 Encounter for therapeutic drug level monitoring: Secondary | ICD-10-CM | POA: Diagnosis not present

## 2019-06-13 LAB — POCT INR: INR: 2.2 (ref 2.0–3.0)

## 2019-06-13 NOTE — Patient Instructions (Signed)
Description   Continue on same dosage 1.5 tablets daily except for 1 tablet on Mondays, Wednesdays and Fridays.  Recheck INR in 3 weeks. Anticoagulation Clinic [phone], Main Number [phone]

## 2019-07-05 ENCOUNTER — Encounter: Payer: Self-pay | Admitting: Cardiology

## 2019-07-05 ENCOUNTER — Ambulatory Visit (INDEPENDENT_AMBULATORY_CARE_PROVIDER_SITE_OTHER): Payer: Medicare Other

## 2019-07-05 ENCOUNTER — Other Ambulatory Visit: Payer: Self-pay

## 2019-07-05 ENCOUNTER — Ambulatory Visit: Payer: Medicare Other | Admitting: Cardiology

## 2019-07-05 VITALS — BP 130/80 | HR 60 | Ht 64.0 in | Wt 177.6 lb

## 2019-07-05 DIAGNOSIS — R55 Syncope and collapse: Secondary | ICD-10-CM

## 2019-07-05 DIAGNOSIS — I480 Paroxysmal atrial fibrillation: Secondary | ICD-10-CM

## 2019-07-05 LAB — POCT INR: INR: 1.9 — AB (ref 2.0–3.0)

## 2019-07-05 NOTE — Patient Instructions (Signed)
Description   Take 1.5 tablets today, then resume same dosage 1.5 tablets daily except for 1 tablet on Mondays, Wednesdays and Fridays.  Recheck INR in 3 weeks. [facility]tico[facility] Clinic [phone], Main Number [phone]

## 2019-07-05 NOTE — Patient Instructions (Signed)
Medication Instructions:  Your physician recommends that you continue on your current medications as directed. Please refer to the Current Medication list given to you today.  *If you need a refill on your cardiac medications before your next appointment, please call your pharmacy*  Follow-Up: At [facility], you and your health needs are our priority.  As part of our continuing mission to provide you with exceptional heart care, we have created designated Provider Care Teams.  These Care Teams include your primary Cardiologist (physician) and Advanced Practice Providers (APPs -  Physician Assistants and Nurse Practitioners) who all work together to provide you with the care you need, when you need it.  We recommend signing up for the patient portal called "MyChart".  Sign up information is provided on this After Visit Summary.  MyChart is used to connect with patients for Virtual Visits (Telemedicine).  Patients are able to view lab/test results, encounter notes, upcoming appointments, etc.  Non-urgent messages can be sent to your provider as well.   To learn more about what you can do with MyChart, go to [email or web address].    Your next appointment:   6 month(s)  The format for your next appointment:   In Person  Provider:   Cecilie Kicks, NP  Follow up with Dr. Marlou Porch in 1 year.

## 2019-07-05 NOTE — Progress Notes (Signed)
Cardiology Office Note:    Date:  07/05/2019   ID:  Cody Thomas, DOB Jul 14, 1930, MRN [id number]  PCP:  Leighton Ruff, MD  Cardiologist:  Candee Furbish, MD  Electrophysiologist:  None   Referring MD: Leighton Ruff, MD     History of Present Illness:    Cody Thomas is a 86 y.o. male here for the follow-up of coronary artery disease. CABG in 2013. Brief postop A. fib. Subsequently he had an episode of atrial fibrillation with occasional bradycardia with CHA2DS2-VASc score of 5. Eliquis was started. Stated that he was unable to afford Eliquis.  Had an episode of near syncope felt like he was going to pass out sitting in the chair. Not long. Did not fall over. Never felt like this before.  Has not had any further syncopal-like episodes  Event monitor 05/26/19:  Multiple episodes atrial flutter with variable conduction  Multiple episodes of non sustained ventricular tachycardia (longest 9 seconds)  Pause greater than 3 seconds (during conversion of atrial flutter to NSR)  Recent echo 03/19/2019 shows normal ejection fraction.  Please refer to EP for evaluation of near syncope in the context of findings from monitor.  1986 dx with palpitations (heart trying to add a beat, he states) Dr. Pamella Pert. Gave up smoking cold [place]. Felt these palpitations while camping at the beach.   He also had Covid infection January 8 with Ab treatment.    Past Medical History:  Diagnosis Date  . Anxiety   . Bunion 09/29/2011  . Cancer (HCC)    nose/Dr. Albertini;skin  . Chronic constipation   . Chronic kidney disease    stage 111  . Coronary atherosclerosis of native coronary artery    2009 LAD CIRC DES  . Depression    doesn't take any meds for this  . Diabetes mellitus    takes Januvia daily  . Dizziness   . Enlarged prostate    but doesn't require meds at present  . GERD (gastroesophageal reflux disease)    TUMS prn  . H/O hiatal hernia   . Headache(784.0)   .  History of gout    doesn't require meds  . Hx of cardiac cath   . Hyperlipidemia    Crestor 3 x wk and Zetia daily  . Hypertension    takes Hyzaar daily  . Insomnia    doesn't require meds at present time  . Joint pain   . Onychomycosis 10/05/2012   x 10  . PONV (postoperative nausea and vomiting)    pt states extremely sick  . Shortness of breath    with exertion    Past Surgical History:  Procedure Laterality Date  . bladder cancer    . CARDIAC CATHETERIZATION  2013  . cataracts removed    . CHOLECYSTECTOMY    . COLONOSCOPY    . CORONARY ANGIOPLASTY     2 stents from 2009  . CORONARY ARTERY BYPASS GRAFT  10/29/2011   Procedure: CORONARY ARTERY BYPASS GRAFTING (CABG);  Surgeon: Ivin Poot, MD;  Location: [facility];  Service: Open Heart Surgery;  Laterality: N/A;  . cyst removed from finger     right hand  . EYE SURGERY     bilateral cataracts  . HEMORRHOID SURGERY    . HERNIA REPAIR     x 2;inguinal  . KIDNEY STONE SURGERY    . LEFT HEART CATHETERIZATION WITH CORONARY ANGIOGRAM N/A 10/21/2011   Procedure: LEFT HEART CATHETERIZATION WITH CORONARY ANGIOGRAM;  Surgeon: Elta Guadeloupe  Marlou Porch, MD;  Location: [facility] CATH LAB;  Service: Cardiovascular;  Laterality: N/A;  . rotator cuff surgery     right   . skin cancer removed      Current Medications: Current Meds  Medication Sig  . b complex vitamins capsule Take 1 capsule by mouth daily.  . calcium carbonate (TUMS - DOSED IN MG ELEMENTAL CALCIUM) 500 MG chewable tablet Chew 2 tablets by mouth daily as needed. For indigestion.   . Coenzyme Q10-Vitamin E 100-1 MG-UNT/5ML SYRP Take 100 mg by mouth daily. Take 2 tablespoons (100MG ) Daily  . fish oil-omega-3 fatty acids 1000 MG capsule Take 1,200 mg by mouth daily. 4 1200 tablets daily  . fluticasone (FLONASE) 50 MCG/ACT nasal spray Place 1 spray into both nostrils daily as needed for allergies or rhinitis.  Marland Kitchen glucose blood test strip Use Accu Chek Smart test strips as instructed to  check blood sugar once daily. DX:E11.65  . LORazepam (ATIVAN) 0.5 MG tablet Take 0.25-0.5 mg by mouth daily as needed for anxiety.   Marland Kitchen losartan-hydrochlorothiazide (HYZAAR) 100-25 MG tablet TAKE 1 TABLET BY MOUTH EVERY DAY  . metFORMIN (GLUCOPHAGE-XR) 500 MG 24 hr tablet TAKE 1 TABLET BY MOUTH TWICE DAILY  . metoprolol tartrate (LOPRESSOR) 25 MG tablet TAKE 1 TABLET BY MOUTH TWICE DAILY  . rosuvastatin (CRESTOR) 20 MG tablet Take 20 mg by mouth See admin instructions. Take 1 tablet by mouth three times weekly.  . traMADol (ULTRAM) 50 MG tablet TAKE 1 TABLET BY MOUTH EVERY 6 HOURS AS NEEDED  . warfarin (COUMADIN) 5 MG tablet TAKE 1 TO 1 AND 1/2 TABLETS BY MOUTH EVERY DAY AS DIRECTED BY THE COUMADIN CLINIC     Allergies:   Crestor [rosuvastatin], Sertraline, and Zocor [simvastatin]   Social History   Socioeconomic History  . Marital status: Married    Spouse name: Not on file  . Number of children: 2  . Years of education: HS  . Highest education level: Not on file  Occupational History  . Occupation: Retired  Tobacco Use  . Smoking status: Former Smoker    Packs/day: 1.50    Years: 30.00    Pack years: 45.00    Types: Cigarettes    Quit date: 03/02/1984    Years since quitting: 35.3  . Smokeless tobacco: Never Used  Substance and Sexual Activity  . Alcohol use: No    Alcohol/week: 0.0 standard drinks  . Drug use: No  . Sexual activity: Not Currently  Other Topics Concern  . Not on file  Social History Narrative   Lives at home with his wife and great-granddaughter.   Right-handed.   Occasional caffeine use.   Social Determinants of Health   Financial Resource Strain:   . Difficulty of Paying Living Expenses:   Food Insecurity:   . Worried About Charity fundraiser in the Last Year:   . Arboriculturist in the Last Year:   Transportation Needs:   . Film/video editor (Medical):   Marland Kitchen Lack of Transportation (Non-Medical):   Physical Activity:   . Days of Exercise per  Week:   . Minutes of Exercise per Session:   Stress:   . Feeling of Stress :   Social Connections:   . Frequency of Communication with Friends and Family:   . Frequency of Social Gatherings with Friends and Family:   . Attends Religious Services:   . Active Member of Clubs or Organizations:   . Attends Archivist Meetings:   .  Marital Status:      Family History: The patient's family history includes Cancer in his brother; Heart disease in his mother; Stroke in his father.  ROS:   Please see the history of present illness.    No fevers chills nausea vomiting bleeding all other systems reviewed and are negative.  EKGs/Labs/Other Studies Reviewed:    The following studies were reviewed today: Echo EF normal in 2021  EKG:  EKG is not ordered today.  prior EKG shows sinus bradycardia 55. First-degree AV block.  Recent Labs: 03/18/2019: B Natriuretic Peptide 786.5 03/19/2019: TSH 2.224 03/20/2019: Hemoglobin 15.2; Platelets 169 05/15/2019: ALT 15; BUN 28; Creatinine, Ser 1.61; Potassium 4.2; Sodium 139  Recent Lipid Panel    Component Value Date/Time   CHOL 116 10/06/2018 0000   TRIG 158 10/06/2018 0000   HDL 34 (A) 10/06/2018 0000   CHOLHDL 3 05/05/2018 0931   VLDL 22.0 05/05/2018 0931   LDLCALC 51 10/06/2018 0000    Physical Exam:    VS:  BP 130/80   Pulse 60   Ht 5\' 4"  (1.626 m)   Wt 177 lb 9.6 oz (80.6 kg)   SpO2 92%   BMI 30.48 kg/m     Wt Readings from Last 3 Encounters:  07/05/19 177 lb 9.6 oz (80.6 kg)  05/18/19 179 lb 12.8 oz (81.6 kg)  04/03/19 175 lb (79.4 kg)     GEN:  Well nourished, well developed in no acute distress HEENT: Normal NECK: No JVD; No carotid bruits LYMPHATICS: No lymphadenopathy CARDIAC: RRR with occasional ectopy, no murmurs, rubs, gallops RESPIRATORY:  Clear to auscultation without rales, wheezing or rhonchi  ABDOMEN: Soft, non-tender, non-distended MUSCULOSKELETAL:  No edema; No deformity  SKIN: Warm and dry  NEUROLOGIC:  Alert and oriented x 3 PSYCHIATRIC:  Normal affect   ASSESSMENT:    1. Syncope, unspecified syncope type   2. PAF (paroxysmal atrial fibrillation) (HCC)    PLAN:    In order of problems listed above:  Paroxysmal atrial fibrillation -Now on Coumadin. Eliquis too expensive. Overall doing well.  Near syncope -Had sensations of "falling asleep ". Unclear cause.  May have passed out in his chair.  Has not felt anything like this previously.  Because of this, he did go to the emergency room for evaluation.  Event monitor as above did show a slightly greater than 3-second pause, postconversion pause atrial flutter to sinus rhythm.  He also had some periods of nonsustained ventricular tachycardia.  He is currently on metoprolol 25 mg twice a day and Coumadin for atrial flutter/fib. -Could he has had a lengthy postconversion pause that caused him to pass out in the chair at home?  It is hard to tell.  He is going to be seeing Dr. Caryl Comes on Monday to review his monitor and to discuss any further therapies for him.  Perhaps we continue with our current plan.  EF normal.  Essential hypertension -Fairly well controlled today. Multidrug regimen reviewed as above.  CAD post CABG -No anginal symptoms. Continue with aggressive secondary risk factor prevention including Crestor 20 mg. Last LDL 51.  Medication Adjustments/Labs and Tests Ordered: Current medicines are reviewed at length with the patient today.  Concerns regarding medicines are outlined above.  No orders of the defined types were placed in this encounter.  No orders of the defined types were placed in this encounter.   Patient Instructions  Medication Instructions:  Your physician recommends that you continue on your current medications as directed.  Please refer to the Current Medication list given to you today.  *If you need a refill on your cardiac medications before your next appointment, please call your pharmacy*   Follow-Up: At [facility], you and your health needs are our priority.  As part of our continuing mission to provide you with exceptional heart care, we have created designated Provider Care Teams.  These Care Teams include your primary Cardiologist (physician) and Advanced Practice Providers (APPs -  Physician Assistants and Nurse Practitioners) who all work together to provide you with the care you need, when you need it.  We recommend signing up for the patient portal called "MyChart".  Sign up information is provided on this After Visit Summary.  MyChart is used to connect with patients for Virtual Visits (Telemedicine).  Patients are able to view lab/test results, encounter notes, upcoming appointments, etc.  Non-urgent messages can be sent to your provider as well.   To learn more about what you can do with MyChart, go to [email or web address].    Your next appointment:   6 month(s)  The format for your next appointment:   In Person  Provider:   Cecilie Kicks, NP  Follow up with Dr. Marlou Porch in 1 year.       Signed, Candee Furbish, MD  07/05/2019 10:11 AM    [facility]

## 2019-07-10 ENCOUNTER — Ambulatory Visit: Payer: Medicare Other | Admitting: Internal Medicine

## 2019-07-10 ENCOUNTER — Encounter: Payer: Self-pay | Admitting: Internal Medicine

## 2019-07-10 ENCOUNTER — Other Ambulatory Visit: Payer: Self-pay

## 2019-07-10 VITALS — BP 162/98 | HR 56 | Ht 64.0 in | Wt 179.0 lb

## 2019-07-10 DIAGNOSIS — I480 Paroxysmal atrial fibrillation: Secondary | ICD-10-CM | POA: Diagnosis not present

## 2019-07-10 DIAGNOSIS — R55 Syncope and collapse: Secondary | ICD-10-CM | POA: Diagnosis not present

## 2019-07-10 NOTE — Patient Instructions (Signed)
Medication Instructions:  Your physician recommends that you continue on your current medications as directed. Please refer to the Current Medication list given to you today.  *If you need a refill on your cardiac medications before your next appointment, please call your pharmacy*   Lab Work: None ordered.  If you have labs (blood work) drawn today and your tests are completely normal, you will receive your results only by: Marland Kitchen MyChart Message (if you have MyChart) OR . A paper copy in the mail If you have any lab test that is abnormal or we need to change your treatment, we will call you to review the results.   Testing/Procedures: None ordered.    Follow-Up: At [facility], you and your health needs are our priority.  As part of our continuing mission to provide you with exceptional heart care, we have created designated Provider Care Teams.  These Care Teams include your primary Cardiologist (physician) and Advanced Practice Providers (APPs -  Physician Assistants and Nurse Practitioners) who all work together to provide you with the care you need, when you need it.  We recommend signing up for the patient portal called "MyChart".  Sign up information is provided on this After Visit Summary.  MyChart is used to connect with patients for Virtual Visits (Telemedicine).  Patients are able to view lab/test results, encounter notes, upcoming appointments, etc.  Non-urgent messages can be sent to your provider as well.   To learn more about what you can do with MyChart, go to [email or web address].    Your next appointment:   08/03/2019 with Dr Caryl Comes  The format for your next appointment:   Virtual Visit   Provider:   You may see Dr Caryl Comes or one of the following Advanced Practice Providers on your designated Care Team:    Chanetta Marshall, NP  Tommye Standard, PA-C  Legrand Como "[place]" [place], [place]

## 2019-07-10 NOTE — Progress Notes (Signed)
ELECTROPHYSIOLOGY CONSULT NOTE  Patient ID: Cody Thomas, MRN: [id number], DOB/AGE: 84-Feb-1932 84 y.o. Admit date: (Not on file) Date of Consult: 07/10/2019  Primary Physician: Leighton Ruff, MD Primary Cardiologist: MS     Cody Thomas is a 86 y.o. male who is being seen today for the evaluation of possible syncope and an abnormal event recorder at the request of Dr. Marlou Porch.    HPI Cody Thomas is a 86 y.o. male referred because of a possible syncopal episode.  He recalls sitting on the sofa in January.  He does not know whether he lost consciousness or whether he fell asleep but he awoke somewhat tremulous.  He had no antecedent symptoms and no recovery symptoms.  He has one other episode of syncope-post micturition.  He has a history of coronary artery disease with bypass surgery 2013 with prior antecedent stenting.  No recent exertional chest pain; he does have post exercise chest discomfort that last 24 hours or so.  He has a history of palpitations.  Back in the mid 80s, Dr. Adelene Idler told him of these.  Suggested he stop smoking which he did cold [place] that day.  Described to him what are likely PVCs.    After CABG postoperative atrial fibrillation.  Anticoagulation has been recommended but was unaffordable.  No shortness of breath no edema no nocturnal dyspnea.   DATE TEST EF   1/21 Echo   60-65 %               Date Cr K Hgb  3/21 1.61 4.2 15.2 (1/21)         Event recorder Personally reviewed   AIVR 100-115 VT-nonsustained 9 seconds Atrial tachycardia-runs without ventricular conduction for up to 4 seconds    Past Medical History:  Diagnosis Date  . Anxiety   . Bunion 09/29/2011  . Cancer (HCC)    nose/Dr. Albertini;skin  . Chronic constipation   . Chronic kidney disease    stage 111  . Coronary atherosclerosis of native coronary artery    2009 LAD CIRC DES  . Depression    doesn't take any meds for this  . Diabetes  mellitus    takes Januvia daily  . Dizziness   . Enlarged prostate    but doesn't require meds at present  . GERD (gastroesophageal reflux disease)    TUMS prn  . H/O hiatal hernia   . Headache(784.0)   . History of gout    doesn't require meds  . Hx of cardiac cath   . Hyperlipidemia    Crestor 3 x wk and Zetia daily  . Hypertension    takes Hyzaar daily  . Insomnia    doesn't require meds at present time  . Joint pain   . Onychomycosis 10/05/2012   x 10  . PONV (postoperative nausea and vomiting)    pt states extremely sick  . Shortness of breath    with exertion      Surgical History:  Past Surgical History:  Procedure Laterality Date  . bladder cancer    . CARDIAC CATHETERIZATION  2013  . cataracts removed    . CHOLECYSTECTOMY    . COLONOSCOPY    . CORONARY ANGIOPLASTY     2 stents from 2009  . CORONARY ARTERY BYPASS GRAFT  10/29/2011   Procedure: CORONARY ARTERY BYPASS GRAFTING (CABG);  Surgeon: Ivin Poot, MD;  Location: [place];  Service: Open Heart Surgery;  Laterality: N/A;  .  cyst removed from finger     right hand  . EYE SURGERY     bilateral cataracts  . HEMORRHOID SURGERY    . HERNIA REPAIR     x 2;inguinal  . KIDNEY STONE SURGERY    . LEFT HEART CATHETERIZATION WITH CORONARY ANGIOGRAM N/A 10/21/2011   Procedure: LEFT HEART CATHETERIZATION WITH CORONARY ANGIOGRAM;  Surgeon: Candee Furbish, MD;  Location: [facility] CATH LAB;  Service: Cardiovascular;  Laterality: N/A;  . rotator cuff surgery     right   . skin cancer removed       Home Meds: Current Meds  Medication Sig  . b complex vitamins capsule Take 1 capsule by mouth daily.  . calcium carbonate (TUMS - DOSED IN MG ELEMENTAL CALCIUM) 500 MG chewable tablet Chew 2 tablets by mouth daily as needed. For indigestion.   . Coenzyme Q10-Vitamin E 100-1 MG-UNT/5ML SYRP Take 100 mg by mouth daily. Take 2 tablespoons (100MG ) Daily  . fish oil-omega-3 fatty acids 1000 MG capsule Take 1,200 mg by mouth daily. 4  1200 tablets daily  . fluticasone (FLONASE) 50 MCG/ACT nasal spray Place 1 spray into both nostrils daily as needed for allergies or rhinitis.  Marland Kitchen glucose blood test strip Use Accu Chek Smart test strips as instructed to check blood sugar once daily. DX:E11.65  . LORazepam (ATIVAN) 0.5 MG tablet Take 0.25-0.5 mg by mouth daily as needed for anxiety.   Marland Kitchen losartan-hydrochlorothiazide (HYZAAR) 100-25 MG tablet TAKE 1 TABLET BY MOUTH EVERY DAY  . metFORMIN (GLUCOPHAGE-XR) 500 MG 24 hr tablet TAKE 1 TABLET BY MOUTH TWICE DAILY  . metoprolol tartrate (LOPRESSOR) 25 MG tablet TAKE 1 TABLET BY MOUTH TWICE DAILY  . rosuvastatin (CRESTOR) 20 MG tablet Take 20 mg by mouth See admin instructions. Take 1 tablet by mouth three times weekly.  . traMADol (ULTRAM) 50 MG tablet TAKE 1 TABLET BY MOUTH EVERY 6 HOURS AS NEEDED  . warfarin (COUMADIN) 5 MG tablet TAKE 1 TO 1 AND 1/2 TABLETS BY MOUTH EVERY DAY AS DIRECTED BY THE COUMADIN CLINIC    Allergies:  Allergies  Allergen Reactions  . Crestor [Rosuvastatin]     Nightmares in high dosage  . Sertraline Diarrhea  . Zocor [Simvastatin]     Nightmares in high dosage    Social History   Socioeconomic History  . Marital status: Married    Spouse name: Not on file  . Number of children: 2  . Years of education: HS  . Highest education level: Not on file  Occupational History  . Occupation: Retired  Tobacco Use  . Smoking status: Former Smoker    Packs/day: 1.50    Years: 30.00    Pack years: 45.00    Types: Cigarettes    Quit date: 03/02/1984    Years since quitting: 35.3  . Smokeless tobacco: Never Used  Substance and Sexual Activity  . Alcohol use: No    Alcohol/week: 0.0 standard drinks  . Drug use: No  . Sexual activity: Not Currently  Other Topics Concern  . Not on file  Social History Narrative   Lives at home with his wife and great-granddaughter.   Right-handed.   Occasional caffeine use.   Social Determinants of Health   Financial  Resource Strain:   . Difficulty of Paying Living Expenses:   Food Insecurity:   . Worried About Charity fundraiser in the Last Year:   . Arboriculturist in the Last Year:   Transportation Needs:   .  Lack of Transportation (Medical):   Marland Kitchen Lack of Transportation (Non-Medical):   Physical Activity:   . Days of Exercise per Week:   . Minutes of Exercise per Session:   Stress:   . Feeling of Stress :   Social Connections:   . Frequency of Communication with Friends and Family:   . Frequency of Social Gatherings with Friends and Family:   . Attends Religious Services:   . Active Member of Clubs or Organizations:   . Attends Archivist Meetings:   Marland Kitchen Marital Status:   Intimate Partner Violence:   . Fear of Current or Ex-Partner:   . Emotionally Abused:   Marland Kitchen Physically Abused:   . Sexually Abused:      Family History  Problem Relation Age of Onset  . Heart disease Mother   . Stroke Father   . Cancer Brother      ROS:  Please see the history of present illness.     All other systems reviewed and negative.    Physical Exam: Blood pressure (!) 162/98, pulse (!) 56, height 5\' 4"  (1.626 m), weight 179 lb (81.2 kg), SpO2 95 %. General: Well developed, well nourished male in no acute distress. Head: Normocephalic, atraumatic, sclera non-icteric, no xanthomas, nares are without discharge. EENT: normal  Lymph Nodes:  none Neck: Negative for carotid bruits. JVD not elevated. Back:without scoliosis kyphosis Lungs: Clear bilaterally to auscultation without wheezes, rales, or rhonchi. Breathing is unlabored. Heart: RRR with S1 S2. No murmur . No rubs, or gallops appreciated. Abdomen: Soft, non-tender, non-distended with normoactive bowel sounds. No hepatomegaly. No rebound/guarding. No obvious abdominal masses. Msk:  Strength and tone appear normal for age. Extremities: No clubbing or cyanosis. No edema.  Distal pedal pulses are 2+ and equal bilaterally. Skin: Warm and  Dry Neuro: Alert and oriented X 3. CN III-XII intact Grossly normal sensory and motor function . Psych:  Responds to questions appropriately with a normal affect.      Labs: Cardiac Enzymes No results for input(s): CKTOTAL, CKMB, TROPONINI in the last 72 hours. CBC Lab Results  Component Value Date   WBC 7.1 03/20/2019   HGB 15.2 03/20/2019   HCT 43.9 03/20/2019   MCV 93.0 03/20/2019   PLT 169 03/20/2019   PROTIME: No results for input(s): LABPROT, INR in the last 72 hours. Chemistry No results for input(s): NA, K, CL, CO2, BUN, CREATININE, CALCIUM, PROT, BILITOT, ALKPHOS, ALT, AST, GLUCOSE in the last 168 hours.  Invalid input(s): LABALBU Lipids Lab Results  Component Value Date   CHOL 116 10/06/2018   HDL 34 (A) 10/06/2018   LDLCALC 51 10/06/2018   TRIG 158 10/06/2018   BNP No results found for: PROBNP Thyroid Function Tests: No results for input(s): TSH, T4TOTAL, T3FREE, THYROIDAB in the last 72 hours.  Invalid input(s): FREET3 Miscellaneous Lab Results  Component Value Date   DDIMER 0.82 (H) 03/18/2019    Radiology/Studies:  No results found.  EKG: Sinus rhythm at 56 Intervals 33/09/44   Assessment and Plan:  Syncope?  Ischemic heart disease with prior bypass surgery  First-degree AV block  Sinus bradycardia  Mobitz 1 heart block-nocturnal  Atrial tachycardia with failure to conduct antegrade  Ventricular tachycardia-nonsustained  Renal insufficiency grade 3   The patient's event is not clear.  There was no loss of postural tone as might be expected with syncope although on the sofa he may have fallen backwards.  The tremulousness afterwards is also not enlightening.  Hence, no specific  treatment is indicated not withstanding the multitude of abnormalities on his event recorder including nonsustained ventricular tachycardia, and nonconducted atrial tachycardia with pauses of up to 3.2-4 seconds.  His bradycardia precludes up titration of his  beta-blockers nonsustained VT.  Might also aggravate his first-degree AV block.  I have suggested 2 courses of action and have discussed this with Dr. Marlou Porch and the patient.  The first would be to do nothing and let a syncopal episode declare itself as it is not clear that the event on the sofa was in fact syncope.  The second strategy would be to implant a loop recorder so that in the event he did have a syncopal episode we would have a rhythm record of that event.  Based on his monitor, syncope can occur with either bradycardia event or a tachycardia event.  Virl Axe

## 2019-07-17 ENCOUNTER — Other Ambulatory Visit: Payer: Self-pay | Admitting: *Deleted

## 2019-07-17 MED ORDER — WARFARIN SODIUM 5 MG PO TABS: ORAL_TABLET | ORAL | 0 refills | Status: DC

## 2019-07-26 ENCOUNTER — Ambulatory Visit: Payer: Medicare Other | Admitting: *Deleted

## 2019-07-26 ENCOUNTER — Other Ambulatory Visit: Payer: Self-pay

## 2019-07-26 DIAGNOSIS — Z5181 Encounter for therapeutic drug level monitoring: Secondary | ICD-10-CM

## 2019-07-26 DIAGNOSIS — I480 Paroxysmal atrial fibrillation: Secondary | ICD-10-CM | POA: Diagnosis not present

## 2019-07-26 LAB — POCT INR: INR: 1.7 — AB (ref 2.0–3.0)

## 2019-07-26 NOTE — Patient Instructions (Signed)
Description   Take 1.5 tablets today, then start taking 1.5 tablets daily except for 1 tablet on Mondays and Fridays.  Recheck INR in 3 weeks. Anticoagulation Clinic [phone], Main Number [phone]

## 2019-08-01 DIAGNOSIS — I472 Ventricular tachycardia: Secondary | ICD-10-CM | POA: Insufficient documentation

## 2019-08-01 DIAGNOSIS — I4729 Other ventricular tachycardia: Secondary | ICD-10-CM | POA: Insufficient documentation

## 2019-08-01 DIAGNOSIS — R001 Bradycardia, unspecified: Secondary | ICD-10-CM | POA: Insufficient documentation

## 2019-08-03 ENCOUNTER — Telehealth (INDEPENDENT_AMBULATORY_CARE_PROVIDER_SITE_OTHER): Payer: Medicare Other | Admitting: Internal Medicine

## 2019-08-03 ENCOUNTER — Other Ambulatory Visit: Payer: Self-pay

## 2019-08-03 ENCOUNTER — Telehealth: Payer: Self-pay

## 2019-08-03 VITALS — Ht 64.0 in | Wt 178.0 lb

## 2019-08-03 DIAGNOSIS — I4729 Other ventricular tachycardia: Secondary | ICD-10-CM

## 2019-08-03 DIAGNOSIS — R55 Syncope and collapse: Secondary | ICD-10-CM | POA: Diagnosis not present

## 2019-08-03 DIAGNOSIS — I472 Ventricular tachycardia: Secondary | ICD-10-CM

## 2019-08-03 DIAGNOSIS — R001 Bradycardia, unspecified: Secondary | ICD-10-CM | POA: Diagnosis not present

## 2019-08-03 NOTE — Patient Instructions (Signed)

## 2019-08-03 NOTE — Telephone Encounter (Signed)
  Patient Consent for Virtual Visit         Cody Thomas has provided verbal consent on 08/03/2019 for a virtual visit (video or telephone).   CONSENT FOR VIRTUAL VISIT FOR:  Cody Thomas  By participating in this virtual visit I agree to the following:  I hereby voluntarily request, consent and authorize [facility] and its employed or contracted physicians, Engineer, materials, nurse practitioners or other licensed health care professionals (the Practitioner), to provide me with telemedicine health care services (the "Services") as deemed necessary by the treating Practitioner. I acknowledge and consent to receive the Services by the Practitioner via telemedicine. I understand that the telemedicine visit will involve communicating with the Practitioner through live audiovisual communication technology and the disclosure of certain medical information by electronic transmission. I acknowledge that I have been given the opportunity to request an in-person assessment or other available alternative prior to the telemedicine visit and am voluntarily participating in the telemedicine visit.  I understand that I have the right to withhold or withdraw my consent to the use of telemedicine in the course of my care at any time, without affecting my right to future care or treatment, and that the Practitioner or I may terminate the telemedicine visit at any time. I understand that I have the right to inspect all information obtained and/or recorded in the course of the telemedicine visit and may receive copies of available information for a reasonable fee.  I understand that some of the potential risks of receiving the Services via telemedicine include:  Marland Kitchen Delay or interruption in medical evaluation due to technological equipment failure or disruption; . Information transmitted may not be sufficient (e.g. poor resolution of images) to allow for appropriate medical decision making by the  Practitioner; and/or  . In rare instances, security protocols could fail, causing a breach of personal health information.  Furthermore, I acknowledge that it is my responsibility to provide information about my medical history, conditions and care that is complete and accurate to the best of my ability. I acknowledge that Practitioner's advice, recommendations, and/or decision may be based on factors not within their control, such as incomplete or inaccurate data provided by me or distortions of diagnostic images or specimens that may result from electronic transmissions. I understand that the practice of medicine is not an exact science and that Practitioner makes no warranties or guarantees regarding treatment outcomes. I acknowledge that a copy of this consent can be made available to me via my patient portal ([place] [place]), or I can request a printed copy by calling the office of [facility].    I understand that my insurance will be billed for this visit.   I have read or had this consent read to me. . I understand the contents of this consent, which adequately explains the benefits and risks of the Services being provided via telemedicine.  . I have been provided ample opportunity to ask questions regarding this consent and the Services and have had my questions answered to my satisfaction. . I give my informed consent for the services to be provided through the use of telemedicine in my medical care

## 2019-08-03 NOTE — Progress Notes (Signed)
Marland Kitchen     Electrophysiology TeleHealth Note   Due to national recommendations of social distancing due to COVID 19, an audio/video telehealth visit is felt to be most appropriate for this patient at this time.  See MyChart message from today for the patient's consent to telehealth for [facility].   Date:  08/03/2019   ID:  Cody Thomas, DOB 08/25/1930, MRN [id number]  Location: patient's home  Provider location: [address] [place]  Evaluation Performed: Follow-up visit  PCP:  Leighton Ruff, MD  Cardiologist:     Electrophysiologist:  SK   Chief Complaint: spells  History of Present Illness:    Cody Thomas is a 86 y.o. male who presents via audio/video conferencing for a telehealth visit today.  Since last being seen in our clinic for spells suggestive of syncope but unassociated with loss of postural tone  the patient reports doing great  Feels like he is 86 yo  The patient denies chest pain, shortness of breath, nocturnal dyspnea, orthopnea or peripheral edema.  There have been no palpitations, lightheadedness or syncope.    No interval spells    He has a history of coronary artery disease with bypass surgery 2013 with prior antecedent stenting   After CABG postoperative atrial fibrillation.  Anticoagulation has been recommended but was unaffordable.    DATE TEST EF   1/21 Echo   60-65 %               Date Cr K Hgb  3/21 1.61 4.2 15.2 (1/21)             The patient denies symptoms of fevers, chills, cough, or new SOB worrisome for COVID 19.    Past Medical History:  Diagnosis Date   Anxiety    Bunion 09/29/2011   [facility])    nose/Dr. Albertini;skin   Chronic constipation    Chronic kidney disease    stage 111   Coronary atherosclerosis of native coronary artery    2009 LAD CIRC DES   Depression    doesn't take any meds for this   Diabetes mellitus    takes Januvia daily   Dizziness    Enlarged  prostate    but doesn't require meds at present   GERD (gastroesophageal reflux disease)    TUMS prn   H/O hiatal hernia    Headache(784.0)    History of gout    doesn't require meds   Hx of cardiac cath    Hyperlipidemia    Crestor 3 x wk and Zetia daily   Hypertension    takes Hyzaar daily   Insomnia    doesn't require meds at present time   Joint pain    Onychomycosis 10/05/2012   x 10   PONV (postoperative nausea and vomiting)    pt states extremely sick   Shortness of breath    with exertion    Past Surgical History:  Procedure Laterality Date   bladder cancer     CARDIAC CATHETERIZATION  2013   cataracts removed     CHOLECYSTECTOMY     COLONOSCOPY     CORONARY ANGIOPLASTY     2 stents from 2009   [place]  10/29/2011   Procedure: CORONARY ARTERY BYPASS GRAFTING (CABG);  Surgeon: Ivin Poot, MD;  Location: [place];  Service: Open Heart Surgery;  Laterality: N/A;   cyst removed from finger     right hand   EYE SURGERY  bilateral cataracts   HEMORRHOID SURGERY     HERNIA REPAIR     x 2;inguinal   KIDNEY STONE SURGERY     LEFT HEART CATHETERIZATION WITH CORONARY ANGIOGRAM N/A 10/21/2011   Procedure: LEFT HEART CATHETERIZATION WITH CORONARY ANGIOGRAM;  Surgeon: Candee Furbish, MD;  Location: [facility] CATH LAB;  Service: Cardiovascular;  Laterality: N/A;   rotator cuff surgery     right    skin cancer removed      Current Outpatient Medications  Medication Sig Dispense Refill   b complex vitamins capsule Take 1 capsule by mouth daily.     calcium carbonate (TUMS - DOSED IN MG ELEMENTAL CALCIUM) 500 MG chewable tablet Chew 2 tablets by mouth daily as needed. For indigestion.      Coenzyme Q10-Vitamin E 100-1 MG-UNT/5ML SYRP Take 100 mg by mouth daily. Take 2 tablespoons (100MG ) Daily     fish oil-omega-3 fatty acids 1000 MG capsule Take 1,200 mg by mouth daily. 4 1200 tablets daily     fluticasone (FLONASE) 50  MCG/ACT nasal spray Place 1 spray into both nostrils daily as needed for allergies or rhinitis.     glucose blood test strip Use Accu Chek Smart test strips as instructed to check blood sugar once daily. DX:E11.65 100 each 3   LORazepam (ATIVAN) 0.5 MG tablet Take 0.25-0.5 mg by mouth daily as needed for anxiety.   0   losartan-hydrochlorothiazide (HYZAAR) 100-25 MG tablet TAKE 1 TABLET BY MOUTH EVERY DAY 90 tablet 3   metFORMIN (GLUCOPHAGE-XR) 500 MG 24 hr tablet TAKE 1 TABLET BY MOUTH TWICE DAILY 180 tablet 1   metoprolol tartrate (LOPRESSOR) 25 MG tablet TAKE 1 TABLET BY MOUTH TWICE DAILY 180 tablet 3   rosuvastatin (CRESTOR) 20 MG tablet Take 20 mg by mouth See admin instructions. Take 1 tablet by mouth three times weekly.     traMADol (ULTRAM) 50 MG tablet TAKE 1 TABLET BY MOUTH EVERY 6 HOURS AS NEEDED 30 tablet 0   warfarin (COUMADIN) 5 MG tablet TAKE 1 TO 1 AND 1/2 TABLETS BY MOUTH EVERY DAY AS DIRECTED BY THE COUMADIN CLINIC 130 tablet 0   No current facility-administered medications for this visit.    Allergies:   Crestor [rosuvastatin], Sertraline, and Zocor [simvastatin]   Social History:  The patient  reports that he quit smoking about 35 years ago. His smoking use included cigarettes. He has a 45.00 pack-year smoking history. He has never used smokeless tobacco. He reports that he does not drink alcohol or use drugs.   Family History:  The patient's   family history includes Cancer in his brother; Heart disease in his mother; Stroke in his father.   ROS:  Please see the history of present illness.   All other systems are personally reviewed and negative.    Exam:    Vital Signs:  Ht 5\' 4"  (1.626 m)    Wt 178 lb (80.7 kg)    BMI 30.55 kg/m   ,   Labs/Other Tests and Data Reviewed:    Recent Labs: 03/18/2019: B Natriuretic Peptide 786.5 03/19/2019: TSH 2.224 03/20/2019: Hemoglobin 15.2; Platelets 169 05/15/2019: ALT 15; BUN 28; Creatinine, Ser 1.61; Potassium 4.2;  Sodium 139   Wt Readings from Last 3 Encounters:  08/03/19 178 lb (80.7 kg)  07/10/19 179 lb (81.2 kg)  07/05/19 177 lb 9.6 oz (80.6 kg)     Other studies personally reviewed:      ASSESSMENT & PLAN:    Syncope?  Ischemic  heart disease with prior bypass surgery  First-degree AV block  Sinus bradycardia  Mobitz 1 heart block-nocturnal  Atrial tachycardia with failure to conduct antegrade  Ventricular tachycardia-nonsustained  Renal insufficiency grade 3  No interval events   He would like to continue to observe without implantation of loop recorder  We will be available as needed, him declining followup with [place] but will see Dr MS    COVID 19 screen The patient denies symptoms of COVID 19 at this time.  The importance of social distancing was discussed today.  Follow-up:  prn    Current medicines are reviewed at length with the patient today.   The patient does not have concerns regarding his medicines.  The following changes were made today:  none  Labs/ tests ordered today include:   No orders of the defined types were placed in this encounter.      Patient Risk:  after full review of this patients clinical status, I feel that they are at moderate  risk at this time.  Today, I have spent 6 minutes with the patient with telehealth technology discussing the above.  Signed, Virl Axe, MD  08/03/2019 3:38 PM     [facility] [address] [place] [address] [phone] (office) [phone] (fax)

## 2019-08-16 ENCOUNTER — Ambulatory Visit: Payer: Medicare Other

## 2019-08-16 ENCOUNTER — Other Ambulatory Visit: Payer: Self-pay

## 2019-08-16 DIAGNOSIS — Z5181 Encounter for therapeutic drug level monitoring: Secondary | ICD-10-CM | POA: Diagnosis not present

## 2019-08-16 DIAGNOSIS — I480 Paroxysmal atrial fibrillation: Secondary | ICD-10-CM

## 2019-08-16 LAB — POCT INR: INR: 2.1 (ref 2.0–3.0)

## 2019-08-16 NOTE — Patient Instructions (Signed)
Description   Continue on same dosage 1.5 tablets daily except for 1 tablet on Mondays and Fridays.  Recheck INR in 4 weeks. Anticoagulation Clinic [phone], Main Number [phone]

## 2019-08-21 ENCOUNTER — Other Ambulatory Visit (INDEPENDENT_AMBULATORY_CARE_PROVIDER_SITE_OTHER): Payer: Medicare Other

## 2019-08-21 ENCOUNTER — Other Ambulatory Visit: Payer: Self-pay

## 2019-08-21 DIAGNOSIS — E1169 Type 2 diabetes mellitus with other specified complication: Secondary | ICD-10-CM

## 2019-08-21 DIAGNOSIS — E7800 Pure hypercholesterolemia, unspecified: Secondary | ICD-10-CM

## 2019-08-21 DIAGNOSIS — E669 Obesity, unspecified: Secondary | ICD-10-CM

## 2019-08-21 LAB — LIPID PANEL
Cholesterol: 126 mg/dL (ref 0–200)
HDL: 38.4 mg/dL — ABNORMAL LOW (ref >39.00)
LDL Cholesterol: 50 mg/dL (ref 0–99)
NonHDL: 87.52
Total CHOL/HDL Ratio: 3
Triglycerides: 187 mg/dL — ABNORMAL HIGH (ref 0.0–149.0)
VLDL: 37.4 mg/dL (ref 0.0–40.0)

## 2019-08-21 LAB — COMPREHENSIVE METABOLIC PANEL
ALT: 17 U/L (ref 0–53)
AST: 23 U/L (ref 0–37)
Albumin: 4.2 g/dL (ref 3.5–5.2)
Alkaline Phosphatase: 41 U/L (ref 39–117)
BUN: 18 mg/dL (ref 6–23)
CO2: 28 mEq/L (ref 19–32)
Calcium: 10 mg/dL (ref 8.4–10.5)
Chloride: 102 mEq/L (ref 96–112)
Creatinine, Ser: 1.37 mg/dL (ref 0.40–1.50)
GFR: 49.17 mL/min — ABNORMAL LOW (ref >60.00)
Glucose, Bld: 107 mg/dL — ABNORMAL HIGH (ref 70–99)
Potassium: 4 mEq/L (ref 3.5–5.1)
Sodium: 137 mEq/L (ref 135–145)
Total Bilirubin: 0.9 mg/dL (ref 0.2–1.2)
Total Protein: 6.9 g/dL (ref 6.0–8.3)

## 2019-08-21 LAB — HEMOGLOBIN A1C: Hgb A1c MFr Bld: 6.4 % (ref 4.6–6.5)

## 2019-08-23 ENCOUNTER — Encounter: Payer: Self-pay | Admitting: Podiatry

## 2019-08-23 ENCOUNTER — Encounter: Payer: Self-pay | Admitting: Endocrinology

## 2019-08-23 ENCOUNTER — Ambulatory Visit: Payer: Medicare Other | Admitting: Endocrinology

## 2019-08-23 ENCOUNTER — Other Ambulatory Visit: Payer: Self-pay

## 2019-08-23 ENCOUNTER — Ambulatory Visit (INDEPENDENT_AMBULATORY_CARE_PROVIDER_SITE_OTHER): Payer: Medicare Other | Admitting: Podiatry

## 2019-08-23 VITALS — BP 122/70 | HR 64 | Ht 64.0 in | Wt 180.6 lb

## 2019-08-23 DIAGNOSIS — M79674 Pain in right toe(s): Secondary | ICD-10-CM | POA: Diagnosis not present

## 2019-08-23 DIAGNOSIS — E1169 Type 2 diabetes mellitus with other specified complication: Secondary | ICD-10-CM

## 2019-08-23 DIAGNOSIS — M79675 Pain in left toe(s): Secondary | ICD-10-CM | POA: Diagnosis not present

## 2019-08-23 DIAGNOSIS — E119 Type 2 diabetes mellitus without complications: Secondary | ICD-10-CM

## 2019-08-23 DIAGNOSIS — M2012 Hallux valgus (acquired), left foot: Secondary | ICD-10-CM

## 2019-08-23 DIAGNOSIS — N289 Disorder of kidney and ureter, unspecified: Secondary | ICD-10-CM | POA: Diagnosis not present

## 2019-08-23 DIAGNOSIS — I10 Essential (primary) hypertension: Secondary | ICD-10-CM | POA: Diagnosis not present

## 2019-08-23 DIAGNOSIS — B351 Tinea unguium: Secondary | ICD-10-CM | POA: Diagnosis not present

## 2019-08-23 DIAGNOSIS — M2011 Hallux valgus (acquired), right foot: Secondary | ICD-10-CM

## 2019-08-23 DIAGNOSIS — E669 Obesity, unspecified: Secondary | ICD-10-CM

## 2019-08-23 NOTE — Progress Notes (Signed)
Patient ID: Cody Thomas, male   DOB: 12-Aug-1930, 86 y.o.   MRN: [id number]   Reason for Appointment: Diabetes follow-up   History of Present Illness   Diagnosis: Type 2 DIABETES MELITUS  He has had mild diabetes which has been previously erratic partly because of dietary inconsistency and difficulty with weight loss He has not been continued on metformin because of renal dysfunction Was given low-dose Januvia instead but this was too expensive for him even though it was helping his control In 2014 he was given low dose Amaryl instead which appears to be keeping his blood sugars controlled  His blood sugars were not well controlled when he was seen in 1/16 with A1c 7.8  Subsequently Amaryl was stopped partly because of mild hypoglycemia  100 recent history:  Since 1/16 he has been on metformin ER and he is  taking 500 mg twice daily  A1c is stable at 6.4  Previous range 6.2-8 . Current blood sugar patterns, problems and management:  He checks his blood sugars somewhat irregularly  He is taking his Metformin twice a day although he thinks that occasionally he may have diarrhea which he has not mentioned before  However his creatinine is relatively better now  He did have a high reading 1 night and this was from eating a candy bar  As before he may be occasionally inconsistent with diet although weight is about the same  His blood sugar in the lab was 107  He is starting to be a little more active basically with gardening and outside work  Monitors blood glucose: Once a day or less .    Glucometer:   Accu-Chek         Blood Glucose readings and averages from download   PRE-MEAL Fasting Lunch Dinner Bedtime Overall  Glucose range:  110-184   99-113  143, 175   Mean/median:      130   POST-MEAL PC Breakfast PC Lunch PC Dinner  Glucose range:    124, 123  Mean/median:      Previous readings:  PRE-MEAL Fasting Lunch Dinner Bedtime Overall  Glucose  range:  98   101    Mean/median:     115   POST-MEAL PC Breakfast PC Lunch PC Dinner  Glucose range:  117   143  Mean/median:        Dietitian consultation last done several years ago  Breakfast meals: Sometimes Cheerios cereal, oatmeal, sometimes an egg, toast and apple butter May have cookies for snacks   Wt Readings from Last 3 Encounters:  08/23/19 180 lb 9.6 oz (81.9 kg)  08/03/19 178 lb (80.7 kg)  07/10/19 179 lb (81.2 kg)   DM LABS:  Lab Results  Component Value Date   HGBA1C 6.4 08/21/2019   HGBA1C 6.3 05/15/2019   HGBA1C 6.4 01/10/2019   Lab Results  Component Value Date   MICROALBUR <0.7 05/15/2019   LDLCALC 50 08/21/2019   CREATININE 1.37 08/21/2019   Lab on 08/21/2019  Component Date Value Ref Range Status  . Cholesterol 08/21/2019 126  0 - 200 mg/dL Final   ATP III Classification       Desirable:  < 200 mg/dL               Borderline High:  200 - 239 mg/dL          High:  > = 240 mg/dL  . Triglycerides 08/21/2019 187.0* 0 - 149 mg/dL Final   Normal:  <  150 mg/dLBorderline High:  150 - 199 mg/dL  . HDL 08/21/2019 38.40* >39.00 mg/dL Final  . VLDL 08/21/2019 37.4  0.0 - 40.0 mg/dL Final  . LDL Cholesterol 08/21/2019 50  0 - 99 mg/dL Final  . Total CHOL/HDL Ratio 08/21/2019 3   Final                  Men          Women1/2 Average Risk     3.4          3.3Average Risk          5.0          4.42X Average Risk          9.6          7.13X Average Risk          15.0          11.0                      . NonHDL 08/21/2019 87.52   Final   NOTE:  Non-HDL goal should be 30 mg/dL higher than patient's LDL goal (i.e. LDL goal of < 70 mg/dL, would have non-HDL goal of < 100 mg/dL)  . Sodium 08/21/2019 137  135 - 145 mEq/L Final  . Potassium 08/21/2019 4.0  3.5 - 5.1 mEq/L Final  . Chloride 08/21/2019 102  96 - 112 mEq/L Final  . CO2 08/21/2019 28  19 - 32 mEq/L Final  . Glucose, Bld 08/21/2019 107* 70 - 99 mg/dL Final  . BUN 08/21/2019 18  6 - 23 mg/dL Final  .  Creatinine, Ser 08/21/2019 1.37  0.40 - 1.50 mg/dL Final  . Total Bilirubin 08/21/2019 0.9  0.2 - 1.2 mg/dL Final  . Alkaline Phosphatase 08/21/2019 41  39 - 117 U/L Final  . AST 08/21/2019 23  0 - 37 U/L Final  . ALT 08/21/2019 17  0 - 53 U/L Final  . Total Protein 08/21/2019 6.9  6.0 - 8.3 g/dL Final  . Albumin 08/21/2019 4.2  3.5 - 5.2 g/dL Final  . GFR 08/21/2019 49.17* >60.00 mL/min Final  . Calcium 08/21/2019 10.0  8.4 - 10.5 mg/dL Final  . Hgb A1c MFr Bld 08/21/2019 6.4  4.6 - 6.5 % Final   Glycemic Control Guidelines for People with Diabetes:Non Diabetic:  <6%Goal of Therapy: <7%Additional Action Suggested:  >8%      Allergies as of 08/23/2019      Reactions   Crestor [rosuvastatin]    Nightmares in high dosage   Sertraline Diarrhea   Zocor [simvastatin]    Nightmares in high dosage      Medication List       Accurate as of August 23, 2019 11:15 AM. If you have any questions, ask your nurse or doctor.        b complex vitamins capsule Take 1 capsule by mouth daily.   calcium carbonate 500 MG chewable tablet Commonly known as: TUMS - dosed in mg elemental calcium Chew 2 tablets by mouth daily as needed. For indigestion.   Coenzyme Q10-Vitamin E 100-1 MG-UNT/5ML Syrp Take 100 mg by mouth daily. Take 2 tablespoons (100MG ) Daily   fish oil-omega-3 fatty acids 1000 MG capsule Take 1,200 mg by mouth daily. 4 1200 tablets daily   fluticasone 50 MCG/ACT nasal spray Commonly known as: FLONASE Place 1 spray into both nostrils daily as needed for allergies or rhinitis.   glucose blood  test strip Use Accu Chek Smart test strips as instructed to check blood sugar once daily. DX:E11.65   LORazepam 0.5 MG tablet Commonly known as: ATIVAN Take 0.25-0.5 mg by mouth daily as needed for anxiety.   losartan-hydrochlorothiazide 100-25 MG tablet Commonly known as: HYZAAR TAKE 1 TABLET BY MOUTH EVERY DAY   metFORMIN 500 MG 24 hr tablet Commonly known as: GLUCOPHAGE-XR TAKE 1  TABLET BY MOUTH TWICE DAILY   metoprolol tartrate 25 MG tablet Commonly known as: LOPRESSOR TAKE 1 TABLET BY MOUTH TWICE DAILY   rosuvastatin 20 MG tablet Commonly known as: CRESTOR Take 20 mg by mouth See admin instructions. Take 1 tablet by mouth three times weekly.   traMADol 50 MG tablet Commonly known as: ULTRAM TAKE 1 TABLET BY MOUTH EVERY 6 HOURS AS NEEDED   warfarin 5 MG tablet Commonly known as: COUMADIN Take as directed by the anticoagulation clinic. If you are unsure how to take this medication, talk to your nurse or doctor. Original instructions: TAKE 1 TO 1 AND 1/2 TABLETS BY MOUTH EVERY DAY AS DIRECTED BY THE COUMADIN CLINIC       Allergies:  Allergies  Allergen Reactions  . Crestor [Rosuvastatin]     Nightmares in high dosage  . Sertraline Diarrhea  . Zocor [Simvastatin]     Nightmares in high dosage    Past Medical History:  Diagnosis Date  . Anxiety   . Bunion 09/29/2011  . Cancer (HCC)    nose/Dr. Albertini;skin  . Chronic constipation   . Chronic kidney disease    stage 111  . Coronary atherosclerosis of native coronary artery    2009 LAD CIRC DES  . Depression    doesn't take any meds for this  . Diabetes mellitus    takes Januvia daily  . Dizziness   . Enlarged prostate    but doesn't require meds at present  . GERD (gastroesophageal reflux disease)    TUMS prn  . H/O hiatal hernia   . Headache(784.0)   . History of gout    doesn't require meds  . Hx of cardiac cath   . Hyperlipidemia    Crestor 3 x wk and Zetia daily  . Hypertension    takes Hyzaar daily  . Insomnia    doesn't require meds at present time  . Joint pain   . Onychomycosis 10/05/2012   x 10  . PONV (postoperative nausea and vomiting)    pt states extremely sick  . Shortness of breath    with exertion    Past Surgical History:  Procedure Laterality Date  . bladder cancer    . CARDIAC CATHETERIZATION  2013  . cataracts removed    . CHOLECYSTECTOMY    .  COLONOSCOPY    . CORONARY ANGIOPLASTY     2 stents from 2009  . CORONARY ARTERY BYPASS GRAFT  10/29/2011   Procedure: CORONARY ARTERY BYPASS GRAFTING (CABG);  Surgeon: Ivin Poot, MD;  Location: [place];  Service: Open Heart Surgery;  Laterality: N/A;  . cyst removed from finger     right hand  . EYE SURGERY     bilateral cataracts  . HEMORRHOID SURGERY    . HERNIA REPAIR     x 2;inguinal  . KIDNEY STONE SURGERY    . LEFT HEART CATHETERIZATION WITH CORONARY ANGIOGRAM N/A 10/21/2011   Procedure: LEFT HEART CATHETERIZATION WITH CORONARY ANGIOGRAM;  Surgeon: Candee Furbish, MD;  Location: [facility] C[facility] LAB;  Service: Cardiovascular;  Laterality: N/A;  .  rotator cuff surgery     right   . skin cancer removed      Family History  Problem Relation Age of Onset  . Heart disease Mother   . Stroke Father   . Cancer Brother     Social History:  reports that he quit smoking about 35 years ago. His smoking use included cigarettes. He has a 45.00 pack-year smoking history. He has never used smokeless tobacco. He reports that he does not drink alcohol and does not use drugs.  Review of Systems:  HYPERTENSION:  followed by PCP and cardiologist and blood pressure is better today  He checks his blood pressure at home regularly also  BP Readings from Last 3 Encounters:  08/23/19 122/70  07/10/19 (!) 162/98  07/05/19 130/80    RENAL dysfunction: Has mild persistent increase in creatinine with some fluctuation  Lab Results  Component Value Date   CREATININE 1.37 08/21/2019   CREATININE 1.61 (H) 05/15/2019   CREATININE 1.3 04/06/2019     HYPERLIPIDEMIA: The lipid abnormality consists of elevated LDL and low HDL treated with Crestor  Stable taking Crestor 20 mg Levels are as below  Lipids followed by PCP and cardiologist    Lab Results  Component Value Date   CHOL 126 08/21/2019   HDL 38.40 (L) 08/21/2019   LDLCALC 50 08/21/2019   TRIG 187.0 (H) 08/21/2019   CHOLHDL 3 08/21/2019      Thyroid nodule: This has been stable and small with previous evaluations  Had a follow-up up  ultrasound in December 2017 done by his PCP, this was stable He also has a 12 mm probably pseudo-nodule present     Examination:   BP 122/70 (BP Location: Left Arm, Patient Position: Sitting, Cuff Size: Normal)   Pulse 64   Ht 5\' 4"  (1.626 m)   Wt 180 lb 9.6 oz (81.9 kg)   SpO2 95%   BMI 31.00 kg/m   Body mass index is 31 kg/m.  Marland Kitchen No ankle edema present   ASSESSMENT/ PLAN:   Diabetes type 2 with mild obesity   See history of present illness for detailed discussion of current diabetes management, blood sugar patterns and problems identified  A1c is stable a 6.4  This is with Metformin 500 mg twice daily He is trying to be more active Blood sugars are only rarely high based on his diet Overall considering his age and comorbid conditions his blood sugar levels are still stable and showing no progression of his diabetes He has occasional diarrhea from Metformin but this is not significant Currently renal function is stable enough to continue this   HYPERTENSION: Blood pressure is well controlled  Renal function relatively better without any change in medications, may be better hydrated recently  Follow-up in 4 months     There are no Patient Instructions on file for this visit.    Elayne Snare 08/23/2019, 11:15 AM

## 2019-08-23 NOTE — Patient Instructions (Signed)
Check blood sugars on waking up 3 days a week  Also check blood sugars about 2 hours after meals and do this after different meals by rotation  Recommended blood sugar levels on waking up are 90-130 and about 2 hours after meal is 130-160  Please bring your blood sugar monitor to each visit, thank you   

## 2019-08-23 NOTE — Patient Instructions (Signed)
Diabetes Mellitus and Foot Care Foot care is an important part of your health, especially when you have diabetes. Diabetes may cause you to have problems because of poor blood flow (circulation) to your feet and legs, which can cause your skin to:  Become thinner and drier.  Break more easily.  Heal more slowly.  Peel and crack. You may also have nerve damage (neuropathy) in your legs and feet, causing decreased feeling in them. This means that you may not notice minor injuries to your feet that could lead to more serious problems. Noticing and addressing any potential problems early is the best way to prevent future foot problems. How to care for your feet Foot hygiene  Wash your feet daily with warm water and mild soap. Do not use hot water. Then, pat your feet and the areas between your toes until they are completely dry. Do not soak your feet as this can dry your skin.  Trim your toenails straight across. Do not dig under them or around the cuticle. File the edges of your nails with an emery board or nail file.  Apply a moisturizing lotion or petroleum jelly to the skin on your feet and to dry, brittle toenails. Use lotion that does not contain alcohol and is unscented. Do not apply lotion between your toes. Shoes and socks  Wear clean socks or stockings every day. Make sure they are not too tight. Do not wear knee-high stockings since they may decrease blood flow to your legs.  Wear shoes that fit properly and have enough cushioning. Always look in your shoes before you put them on to be sure there are no objects inside.  To break in new shoes, wear them for just a few hours a day. This prevents injuries on your feet. Wounds, scrapes, corns, and calluses  Check your feet daily for blisters, cuts, bruises, sores, and redness. If you cannot see the bottom of your feet, use a mirror or ask someone for help.  Do not cut corns or calluses or try to remove them with medicine.  If you  find a minor scrape, cut, or break in the skin on your feet, keep it and the skin around it clean and dry. You may clean these areas with mild soap and water. Do not clean the area with peroxide, alcohol, or iodine.  If you have a wound, scrape, corn, or callus on your foot, look at it several times a day to make sure it is healing and not infected. Check for: ? Redness, swelling, or pain. ? Fluid or blood. ? Warmth. ? Pus or a bad smell. General instructions  Do not cross your legs. This may decrease blood flow to your feet.  Do not use heating pads or hot water bottles on your feet. They may burn your skin. If you have lost feeling in your feet or legs, you may not know this is happening until it is too late.  Protect your feet from hot and cold by wearing shoes, such as at the beach or on hot pavement.  Schedule a complete foot exam at least once a year (annually) or more often if you have foot problems. If you have foot problems, report any cuts, sores, or bruises to your health care provider immediately. Contact a health care provider if:  You have a medical condition that increases your risk of infection and you have any cuts, sores, or bruises on your feet.  You have an injury that is not   healing.  You have redness on your legs or feet.  You feel burning or tingling in your legs or feet.  You have pain or cramps in your legs and feet.  Your legs or feet are numb.  Your feet always feel cold.  You have pain around a toenail. Get help right away if:  You have a wound, scrape, corn, or callus on your foot and: ? You have pain, swelling, or redness that gets worse. ? You have fluid or blood coming from the wound, scrape, corn, or callus. ? Your wound, scrape, corn, or callus feels warm to the touch. ? You have pus or a bad smell coming from the wound, scrape, corn, or callus. ? You have a fever. ? You have a red line going up your leg. Summary  Check your feet every day  for cuts, sores, red spots, swelling, and blisters.  Moisturize feet and legs daily.  Wear shoes that fit properly and have enough cushioning.  If you have foot problems, report any cuts, sores, or bruises to your health care provider immediately.  Schedule a complete foot exam at least once a year (annually) or more often if you have foot problems. This information is not intended to replace advice given to you by your health care provider. Make sure you discuss any questions you have with your health care provider. Document Revised: 11/09/2018 Document Reviewed: 03/20/2016 Elsevier Patient Education  2020 [employer]vier Inc.  

## 2019-08-26 ENCOUNTER — Other Ambulatory Visit: Payer: Self-pay | Admitting: Cardiology

## 2019-08-26 ENCOUNTER — Other Ambulatory Visit: Payer: Self-pay | Admitting: Endocrinology

## 2019-08-27 NOTE — Progress Notes (Signed)
Subjective: Cody Thomas presents today for preventative diabetic foot care, for diabetic foot evaluation and painful mycotic nails b/l that are difficult to trim. Pain interferes with ambulation. Aggravating factors include wearing enclosed shoe gear. Pain is relieved with periodic professional debridement.  Cody Thomas voices no new pedal problems on today's visit.  Past Medical History:  Diagnosis Date  . Anxiety   . Bunion 09/29/2011  . Cancer (HCC)    nose/Dr. Albertini;skin  . Chronic constipation   . Chronic kidney disease    stage 111  . Coronary atherosclerosis of native coronary artery    2009 LAD CIRC DES  . Depression    doesn't take any meds for this  . Diabetes mellitus    takes Januvia daily  . Dizziness   . Enlarged prostate    but doesn't require meds at present  . GERD (gastroesophageal reflux disease)    TUMS prn  . H/O hiatal hernia   . Headache(784.0)   . History of gout    doesn't require meds  . Hx of cardiac cath   . Hyperlipidemia    Crestor 3 x wk and Zetia daily  . Hypertension    takes Hyzaar daily  . Insomnia    doesn't require meds at present time  . Joint pain   . Onychomycosis 10/05/2012   x 10  . PONV (postoperative nausea and vomiting)    pt states extremely sick  . Shortness of breath    with exertion    Patient Active Problem List   Diagnosis Date Noted  . Sinus bradycardia 08/01/2019  . NSVT (nonsustained ventricular tachycardia) ([place]) 08/01/2019  . Long term (current) use of anticoagulants 04/24/2019  . Near syncope 03/19/2019  . PAF (paroxysmal atrial fibrillation) ([place]) 03/19/2019  . Atrial fibrillation ([place]) 03/18/2019  . OSA (obstructive sleep apnea) 12/14/2016  . Obese 12/14/2016  . Degenerative disc disease, lumbar 02/13/2016  . Chronic left-sided low back pain with left-sided sciatica 02/13/2016  . Left thyroid nodule 01/21/2015  . Pulmonary nodules/lesions, multiple 10/30/2014  . CKD (chronic kidney  disease) stage 3, GFR 30-59 ml/min 09/21/2014  . Arcus senilis of both eyes 09/06/2014  . Ectropion of right eye 09/06/2014  . Bladder cancer ([place]) 08/09/2014  . Recurrent nephrolithiasis 04/23/2014  . Benign nodular prostatic hyperplasia with lower urinary tract symptoms 10/30/2013  . Type II or unspecified type diabetes mellitus without mention of complication, not stated as uncontrolled 10/20/2012  . Onychomycosis 10/05/2012  . Chest pain, unspecified 10/21/2011  . Hypertension   . Hyperlipidemia   . Hx of cardiac cath   . Coronary atherosclerosis of native coronary artery   . Family history of malignant neoplasm of prostate 10/19/2011    Past Surgical History:  Procedure Laterality Date  . bladder cancer    . CARDIAC CATHETERIZATION  2013  . cataracts removed    . CHOLECYSTECTOMY    . COLONOSCOPY    . CORONARY ANGIOPLASTY     2 stents from 2009  . CORONARY ARTERY BYPASS GRAFT  10/29/2011   Procedure: CORONARY ARTERY BYPASS GRAFTING (CABG);  Surgeon: Ivin Poot, MD;  Location: [place];  Service: Open Heart Surgery;  Laterality: N/A;  . cyst removed from finger     right hand  . EYE SURGERY     bilateral cataracts  . HEMORRHOID SURGERY    . HERNIA REPAIR     x 2;inguinal  . KIDNEY STONE SURGERY    . LEFT HEART CATHETERIZATION WITH CORONARY  ANGIOGRAM N/A 10/21/2011   Procedure: LEFT HEART CATHETERIZATION WITH CORONARY ANGIOGRAM;  Surgeon: Candee Furbish, MD;  Location: [facility] CATH LAB;  Service: Cardiovascular;  Laterality: N/A;  . rotator cuff surgery     right   . skin cancer removed      Current Outpatient Medications on File Prior to Visit  Medication Sig Dispense Refill  . b complex vitamins capsule Take 1 capsule by mouth daily.    . calcium carbonate (TUMS - DOSED IN MG ELEMENTAL CALCIUM) 500 MG chewable tablet Chew 2 tablets by mouth daily as needed. For indigestion.     . Coenzyme Q10-Vitamin E 100-1 MG-UNT/5ML SYRP Take 100 mg by mouth daily. Take 2 tablespoons  (100MG ) Daily    . fish oil-omega-3 fatty acids 1000 MG capsule Take 1,200 mg by mouth daily. 4 1200 tablets daily    . fluticasone (FLONASE) 50 MCG/ACT nasal spray Place 1 spray into both nostrils daily as needed for allergies or rhinitis.    Marland Kitchen glucose blood test strip Use Accu Chek Smart test strips as instructed to check blood sugar once daily. DX:E11.65 100 each 3  . LORazepam (ATIVAN) 0.5 MG tablet Take 0.25-0.5 mg by mouth daily as needed for anxiety.   0  . losartan-hydrochlorothiazide (HYZAAR) 100-25 MG tablet TAKE 1 TABLET BY MOUTH EVERY DAY 90 tablet 3  . metFORMIN (GLUCOPHAGE-XR) 500 MG 24 hr tablet TAKE 1 TABLET BY MOUTH TWICE DAILY 180 tablet 1  . metoprolol tartrate (LOPRESSOR) 25 MG tablet TAKE 1 TABLET BY MOUTH TWICE DAILY 180 tablet 3  . rosuvastatin (CRESTOR) 20 MG tablet Take 20 mg by mouth See admin instructions. Take 1 tablet by mouth three times weekly.    . traMADol (ULTRAM) 50 MG tablet TAKE 1 TABLET BY MOUTH EVERY 6 HOURS AS NEEDED 30 tablet 0  . warfarin (COUMADIN) 5 MG tablet TAKE 1 TO 1 AND 1/2 TABLETS BY MOUTH EVERY DAY AS DIRECTED BY THE COUMADIN CLINIC 130 tablet 0   No current facility-administered medications on file prior to visit.     Allergies  Allergen Reactions  . Crestor [Rosuvastatin]     Nightmares in high dosage  . Sertraline Diarrhea  . Zocor [Simvastatin]     Nightmares in high dosage    Social History   Occupational History  . Occupation: Retired  Tobacco Use  . Smoking status: Former Smoker    Packs/day: 1.50    Years: 30.00    Pack years: 45.00    Types: Cigarettes    Quit date: 03/02/1984    Years since quitting: 35.5  . Smokeless tobacco: Never Used  Vaping Use  . Vaping Use: Never used  Substance and Sexual Activity  . Alcohol use: No    Alcohol/week: 0.0 standard drinks  . Drug use: No  . Sexual activity: Not Currently    Family History  Problem Relation Age of Onset  . Heart disease Mother   . Stroke Father   . Cancer  Brother     Immunization History  Administered Date(s) Administered  . Influenza, High Dose Seasonal PF 11/09/2016  . Influenza,inj,Quad PF,6+ Mos 01/19/2013  . Influenza-Unspecified 12/19/2013, 12/21/2014  . PFIZER SARS-COV-2 Vaccination 06/08/2019, 07/06/2019  . Pneumococcal Conjugate-13 10/29/2012  . Pneumococcal Polysaccharide-23 10/29/2009     Objective: There were no vitals filed for this visit.  Cody Thomas is a/an 86 y.o. Caucasian male  IN NAD. AAO X 3.  Capillary refill time to digits immediate b/l. Palpable DP pulses b/l.  Palpable PT pulses b/l. Pedal hair present b/l. Skin temperature gradient within normal limits b/l.  Pedal skin is thin shiny, atrophic bilaterally. No open wounds bilaterally. No interdigital macerations bilaterally. Toenails 1-5 b/l elongated, dystrophic, thickened, crumbly with subungual debris and tenderness to dorsal palpation.  Normal muscle strength 5/5 to all lower extremity muscle groups bilaterally, no pain crepitus or joint limitation noted with ROM b/l and bunion deformity noted b/l  Protective sensation intact 5/5 intact bilaterally with 10g monofilament b/l Vibratory sensation intact b/l Proprioception intact bilaterally  Hemoglobin A1C Latest Ref Rng & Units 08/21/2019 05/15/2019 01/10/2019 10/06/2018 09/05/2018  HGBA1C 4.6 - 6.5 % 6.4 6.3 6.4 6.3 6.6(H)  Some recent data might be hidden   1. Pain due to onychomycosis of toenails of both feet   2. Hallux valgus, acquired, bilateral   3. Controlled type 2 diabetes mellitus without complication, without long-term current use of insulin ([place])     -Diabetic foot examination performed on today's visit. -Continue diabetic foot care principles. Literature dispensed on today.  -Toenails 1-5 b/l were debrided in length and girth with sterile nail nippers and dremel without iatrogenic bleeding.  -Patient to continue soft, supportive shoe gear daily. -Patient to report any pedal injuries to  medical professional immediately. -Patient/POA to call should there be question/concern in the interim.  Return in about 3 months (around 11/23/2019) for diabetic nail trim/ Coumadin.

## 2019-08-28 ENCOUNTER — Other Ambulatory Visit: Payer: Self-pay

## 2019-08-28 MED ORDER — LOSARTAN POTASSIUM-HCTZ 100-25 MG PO TABS: 1.0000 | ORAL_TABLET | Freq: Every day | ORAL | 3 refills | Status: DC

## 2019-09-13 ENCOUNTER — Other Ambulatory Visit: Payer: Self-pay

## 2019-09-13 ENCOUNTER — Ambulatory Visit: Payer: Medicare Other | Admitting: *Deleted

## 2019-09-13 DIAGNOSIS — I480 Paroxysmal atrial fibrillation: Secondary | ICD-10-CM

## 2019-09-13 DIAGNOSIS — Z5181 Encounter for therapeutic drug level monitoring: Secondary | ICD-10-CM

## 2019-09-13 LAB — POCT INR: INR: 2.6 (ref 2.0–3.0)

## 2019-09-13 NOTE — Patient Instructions (Signed)
Description   Continue taking Warfarin 1.5 tablets daily except for 1 tablet on Mondays and Fridays.  Recheck INR in 4 weeks. Anticoagulation Clinic [phone], Main Number [phone]

## 2019-10-11 ENCOUNTER — Other Ambulatory Visit: Payer: Self-pay

## 2019-10-11 ENCOUNTER — Ambulatory Visit: Payer: Medicare Other | Admitting: *Deleted

## 2019-10-11 DIAGNOSIS — I480 Paroxysmal atrial fibrillation: Secondary | ICD-10-CM | POA: Diagnosis not present

## 2019-10-11 DIAGNOSIS — Z5181 Encounter for therapeutic drug level monitoring: Secondary | ICD-10-CM | POA: Diagnosis not present

## 2019-10-11 LAB — POCT INR: INR: 2.6 (ref 2.0–3.0)

## 2019-10-11 NOTE — Patient Instructions (Signed)
Description   Continue taking Warfarin 1.5 tablets daily except for 1 tablet on Mondays and Fridays.  Recheck INR in 5 weeks. Anticoagulation Clinic [phone], Main Number [phone]

## 2019-10-19 ENCOUNTER — Other Ambulatory Visit: Payer: Self-pay

## 2019-10-19 MED ORDER — WARFARIN SODIUM 5 MG PO TABS: ORAL_TABLET | ORAL | 1 refills | Status: DC

## 2019-11-15 ENCOUNTER — Other Ambulatory Visit: Payer: Self-pay

## 2019-11-15 ENCOUNTER — Ambulatory Visit: Payer: Medicare Other | Admitting: *Deleted

## 2019-11-15 DIAGNOSIS — I480 Paroxysmal atrial fibrillation: Secondary | ICD-10-CM | POA: Diagnosis not present

## 2019-11-15 DIAGNOSIS — Z5181 Encounter for therapeutic drug level monitoring: Secondary | ICD-10-CM

## 2019-11-15 LAB — POCT INR: INR: 2.3 (ref 2.0–3.0)

## 2019-11-15 NOTE — Patient Instructions (Signed)
Description   Continue taking Warfarin 1.5 tablets daily except for 1 tablet on Mondays and Fridays.  Recheck INR in 6 weeks. Anticoagulation Clinic [phone], Main Number [phone]    

## 2019-11-29 ENCOUNTER — Other Ambulatory Visit: Payer: Self-pay

## 2019-11-29 ENCOUNTER — Encounter: Payer: Self-pay | Admitting: Podiatry

## 2019-11-29 ENCOUNTER — Ambulatory Visit (INDEPENDENT_AMBULATORY_CARE_PROVIDER_SITE_OTHER): Payer: Medicare Other | Admitting: Podiatry

## 2019-11-29 DIAGNOSIS — E119 Type 2 diabetes mellitus without complications: Secondary | ICD-10-CM | POA: Diagnosis not present

## 2019-11-29 DIAGNOSIS — M79674 Pain in right toe(s): Secondary | ICD-10-CM

## 2019-11-29 DIAGNOSIS — M79675 Pain in left toe(s): Secondary | ICD-10-CM

## 2019-11-29 DIAGNOSIS — B351 Tinea unguium: Secondary | ICD-10-CM

## 2019-11-29 DIAGNOSIS — M2011 Hallux valgus (acquired), right foot: Secondary | ICD-10-CM

## 2019-11-29 DIAGNOSIS — M2012 Hallux valgus (acquired), left foot: Secondary | ICD-10-CM

## 2019-11-30 NOTE — Progress Notes (Signed)
Subjective: Cody Thomas presents today for preventative diabetic foot care, for diabetic foot evaluation and painful mycotic nails b/l that are difficult to trim. Pain interferes with ambulation. Aggravating factors include wearing enclosed shoe gear. Pain is relieved with periodic professional debridement.  Patient states his blood sugar was 111 mg/dL this morning.  PCP is Dr. Leighton Ruff.  He is also followed by Dr. Elayne Snare for his diabetes.  Last visit with Dr. Dwyane Dee was August 23, 2019.  He voices no new pedal concerns from today's visit.  Objective: There were no vitals filed for this visit.  [name] is a/an 86 y.o. Caucasian male  IN NAD. AAO X 3.  Capillary refill time to digits immediate b/l. Palpable DP pulses b/l. Palpable PT pulses b/l. Pedal hair present b/l. Skin temperature gradient within normal limits b/l.  Pedal skin is thin shiny, atrophic bilaterally. No open wounds bilaterally. No interdigital macerations bilaterally. Toenails 1-5 b/l elongated, dystrophic, thickened, crumbly with subungual debris and tenderness to dorsal palpation.  Normal muscle strength 5/5 to all lower extremity muscle groups bilaterally, no pain crepitus or joint limitation noted with ROM b/l and bunion deformity noted b/l  Protective sensation intact 5/5 intact bilaterally with 10g monofilament b/l Vibratory sensation intact b/l Proprioception intact bilaterally  Hemoglobin A1C Latest Ref Rng & Units 08/21/2019 05/15/2019 01/10/2019  HGBA1C 4.6 - 6.5 % 6.4 6.3 6.4  Some recent data might be hidden   1. Pain due to onychomycosis of toenails of both feet   2. Hallux valgus, acquired, bilateral   3. Controlled type 2 diabetes mellitus without complication, without long-term current use of insulin ([place])     -Examined patient. -No new findings. No new orders. -Continue diabetic foot care principles. -Toenails 1-5 b/l were debrided in length and girth with sterile nail  nippers and dremel without iatrogenic bleeding.  -Patient to report any pedal injuries to medical professional immediately. -Patient to continue soft, supportive shoe gear daily. -Patient/POA to call should there be question/concern in the interim.  Return in about 3 months (around 02/28/2020).

## 2019-12-15 DIAGNOSIS — Z23 Encounter for immunization: Secondary | ICD-10-CM | POA: Diagnosis not present

## 2019-12-16 ENCOUNTER — Encounter: Payer: Self-pay | Admitting: Internal Medicine

## 2019-12-19 ENCOUNTER — Other Ambulatory Visit: Payer: Self-pay

## 2019-12-19 ENCOUNTER — Ambulatory Visit: Payer: Medicare Other | Admitting: *Deleted

## 2019-12-19 DIAGNOSIS — Z5181 Encounter for therapeutic drug level monitoring: Secondary | ICD-10-CM | POA: Diagnosis not present

## 2019-12-19 DIAGNOSIS — I480 Paroxysmal atrial fibrillation: Secondary | ICD-10-CM

## 2019-12-19 LAB — POCT INR: INR: 2.9 (ref 2.0–3.0)

## 2019-12-19 NOTE — Patient Instructions (Signed)
Description   Continue taking Warfarin 1.5 tablets daily except for 1 tablet on Mondays and Fridays.  Recheck INR in 6 weeks. Anticoagulation Clinic [phone], Main Number [phone]

## 2019-12-21 ENCOUNTER — Other Ambulatory Visit: Payer: Self-pay

## 2019-12-21 ENCOUNTER — Other Ambulatory Visit (INDEPENDENT_AMBULATORY_CARE_PROVIDER_SITE_OTHER): Payer: Medicare Other

## 2019-12-21 DIAGNOSIS — E1169 Type 2 diabetes mellitus with other specified complication: Secondary | ICD-10-CM | POA: Diagnosis not present

## 2019-12-21 DIAGNOSIS — E669 Obesity, unspecified: Secondary | ICD-10-CM | POA: Diagnosis not present

## 2019-12-21 LAB — HEMOGLOBIN A1C: Hgb A1c MFr Bld: 6.6 % — ABNORMAL HIGH (ref 4.6–6.5)

## 2019-12-22 LAB — BASIC METABOLIC PANEL
BUN: 23 mg/dL (ref 6–23)
CO2: 30 mEq/L (ref 19–32)
Calcium: 9.6 mg/dL (ref 8.4–10.5)
Chloride: 102 mEq/L (ref 96–112)
Creatinine, Ser: 1.44 mg/dL (ref 0.40–1.50)
GFR: 47 mL/min — ABNORMAL LOW (ref >60.00)
Glucose, Bld: 112 mg/dL — ABNORMAL HIGH (ref 70–99)
Potassium: 4 mEq/L (ref 3.5–5.1)
Sodium: 139 mEq/L (ref 135–145)

## 2019-12-27 ENCOUNTER — Ambulatory Visit: Payer: Medicare Other | Admitting: Endocrinology

## 2020-01-02 ENCOUNTER — Other Ambulatory Visit: Payer: Self-pay

## 2020-01-02 ENCOUNTER — Encounter: Payer: Self-pay | Admitting: Endocrinology

## 2020-01-02 ENCOUNTER — Ambulatory Visit: Payer: Medicare Other | Admitting: Endocrinology

## 2020-01-02 VITALS — BP 140/90 | HR 64 | Ht 64.0 in | Wt 184.0 lb

## 2020-01-02 DIAGNOSIS — E041 Nontoxic single thyroid nodule: Secondary | ICD-10-CM

## 2020-01-02 DIAGNOSIS — E1169 Type 2 diabetes mellitus with other specified complication: Secondary | ICD-10-CM

## 2020-01-02 DIAGNOSIS — E669 Obesity, unspecified: Secondary | ICD-10-CM | POA: Diagnosis not present

## 2020-01-02 NOTE — Progress Notes (Signed)
Patient ID: Cody Thomas, male   DOB: May 31, 1930, 86 y.o.   MRN: [id number]   Reason for Appointment: Diabetes follow-up   History of Present Illness   Diagnosis: Type 2 DIABETES MELITUS  He has had mild diabetes which has been previously erratic partly because of dietary inconsistency and difficulty with weight loss He has not been continued on metformin because of renal dysfunction Was given low-dose Januvia instead but this was too expensive for him even though it was helping his control In 2014 he was given low dose Amaryl instead which appears to be keeping his blood sugars controlled  His blood sugars were not well controlled when he was seen in 1/16 with A1c 7.8  Subsequently Amaryl was stopped partly because of mild hypoglycemia  recent history:  Since 1/16 he has been on metformin ER and he is  taking 500 mg twice daily  A1c is slightly higher at 6.6, previously was at 6.4  Previous range 6.2-8.0 . Current blood sugar patterns, problems and management:  He has not been as active at least in the last month  Also has gained weight  He thinks he is not watching his diet consistently and still likes to eat sweets  He has had a couple of readings around 170+ both in the morning and at night  However blood sugars have been as low as 90 in the morning  He has only occasional diarrhea from Metformin which he is tolerating  Monitors blood glucose: Once a day or less .    Glucometer:   Accu-Chek         Blood Glucose readings and averages from download   PRE-MEAL Fasting Lunch Dinner Bedtime Overall  Glucose range:  90-173    135-179   Mean/median:  121     129   POST-MEAL PC Breakfast PC Lunch PC Dinner  Glucose range:   109, 129   Mean/median:       PREVIOUSLY:  PRE-MEAL Fasting Lunch Dinner Bedtime Overall  Glucose range:  110-184   99-113  143, 175   Mean/median:      130   POST-MEAL PC Breakfast PC Lunch PC Dinner  Glucose range:    124,  123  Mean/median:       Dietitian consultation last done several years ago  Breakfast meals: Sometimes Cheerios cereal, oatmeal, sometimes an egg, toast and apple butter May have cookies for snacks   Wt Readings from Last 3 Encounters:  01/02/20 184 lb (83.5 kg)  08/23/19 180 lb 9.6 oz (81.9 kg)  08/03/19 178 lb (80.7 kg)   DM LABS:  Lab Results  Component Value Date   HGBA1C 6.6 (H) 12/21/2019   HGBA1C 6.4 08/21/2019   HGBA1C 6.3 05/15/2019   Lab Results  Component Value Date   MICROALBUR <0.7 05/15/2019   LDLCALC 50 08/21/2019   CREATININE 1.44 12/21/2019   No visits with results within 1 Week(s) from this visit.  Latest known visit with results is:  Lab on 12/21/2019  Component Date Value Ref Range Status   Sodium 12/21/2019 139  135 - 145 mEq/L Final   Potassium 12/21/2019 4.0  3.5 - 5.1 mEq/L Final   Chloride 12/21/2019 102  96 - 112 mEq/L Final   CO2 12/21/2019 30  19 - 32 mEq/L Final   Glucose, Bld 12/21/2019 112* 70 - 99 mg/dL Final   BUN 12/21/2019 23  6 - 23 mg/dL Final   Creatinine, Ser 12/21/2019 1.44  0.40 -  1.50 mg/dL Final   GFR 12/21/2019 47.00* >60.00 mL/min Corrected   Calcium 12/21/2019 9.6  8.4 - 10.5 mg/dL Final   Hgb A1c MFr Bld 12/21/2019 6.6* 4.6 - 6.5 % Final   Glycemic Control Guidelines for People with Diabetes:Non Diabetic:  <6%Goal of Therapy: <7%Additional Action Suggested:  >8%      Allergies as of 01/02/2020      Reactions   Crestor [rosuvastatin]    Nightmares in high dosage   Sertraline Diarrhea   Zocor [simvastatin]    Nightmares in high dosage      Medication List       Accurate as of January 02, 2020 10:31 AM. If you have any questions, ask your nurse or doctor.        b complex vitamins capsule Take 1 capsule by mouth daily.   calcium carbonate 500 MG chewable tablet Commonly known as: TUMS - dosed in mg elemental calcium Chew 2 tablets by mouth daily as needed. For indigestion.   Coenzyme Q10-Vitamin  E 100-1 MG-UNT/5ML Syrp Take 100 mg by mouth daily. Take 2 tablespoons (100MG ) Daily   fish oil-omega-3 fatty acids 1000 MG capsule Take 1,200 mg by mouth daily. 4 1200 tablets daily   fluticasone 50 MCG/ACT nasal spray Commonly known as: FLONASE Place 1 spray into both nostrils daily as needed for allergies or rhinitis.   glucose blood test strip Use Accu Chek Smart test strips as instructed to check blood sugar once daily. DX:E11.65   LORazepam 0.5 MG tablet Commonly known as: ATIVAN Take 0.25-0.5 mg by mouth daily as needed for anxiety.   losartan-hydrochlorothiazide 100-25 MG tablet Commonly known as: HYZAAR Take 1 tablet by mouth daily.   metFORMIN 500 MG 24 hr tablet Commonly known as: GLUCOPHAGE-XR TAKE 1 TABLET BY MOUTH TWICE DAILY   metoprolol tartrate 25 MG tablet Commonly known as: LOPRESSOR TAKE 1 TABLET BY MOUTH TWICE DAILY   rosuvastatin 20 MG tablet Commonly known as: CRESTOR Take 20 mg by mouth See admin instructions. Take 1 tablet by mouth three times weekly.   traMADol 50 MG tablet Commonly known as: ULTRAM TAKE 1 TABLET BY MOUTH EVERY 6 HOURS AS NEEDED   warfarin 5 MG tablet Commonly known as: COUMADIN Take as directed by the anticoagulation clinic. If you are unsure how to take this medication, talk to your nurse or doctor. Original instructions: TAKE 1 TO 1 AND 1/2 TABLETS BY MOUTH EVERY DAY AS DIRECTED BY THE COUMADIN CLINIC       Allergies:  Allergies  Allergen Reactions   Crestor [Rosuvastatin]     Nightmares in high dosage   Sertraline Diarrhea   Zocor [Simvastatin]     Nightmares in high dosage    Past Medical History:  Diagnosis Date   Anxiety    Bunion 09/29/2011   Cancer (HCC)    nose/Dr. Albertini;skin   Chronic constipation    Chronic kidney disease    stage 111   Coronary atherosclerosis of native coronary artery    2009 LAD CIRC DES   Depression    doesn't take any meds for this   Diabetes mellitus     takes Januvia daily   Dizziness    Enlarged prostate    but doesn't require meds at present   GERD (gastroesophageal reflux disease)    TUMS prn   H/O hiatal hernia    Headache(784.0)    History of gout    doesn't require meds   Hx of cardiac cath  Hyperlipidemia    Crestor 3 x wk and Zetia daily   Hypertension    takes Hyzaar daily   Insomnia    doesn't require meds at present time   Joint pain    Onychomycosis 10/05/2012   x 10   PONV (postoperative nausea and vomiting)    pt states extremely sick   Shortness of breath    with exertion    Past Surgical History:  Procedure Laterality Date   bladder cancer     CARDIAC CATHETERIZATION  2013   cataracts removed     CHOLECYSTECTOMY     COLONOSCOPY     CORONARY ANGIOPLASTY     2 stents from 2009   [place]  10/29/2011   Procedure: CORONARY ARTERY BYPASS GRAFTING (CABG);  Surgeon: Ivin Poot, MD;  Location: [place];  Service: Open Heart Surgery;  Laterality: N/A;   cyst removed from finger     right hand   EYE SURGERY     bilateral cataracts   HEMORRHOID SURGERY     HERNIA REPAIR     x 2;inguinal   KIDNEY STONE SURGERY     LEFT HEART CATHETERIZATION WITH CORONARY ANGIOGRAM N/A 10/21/2011   Procedure: LEFT HEART CATHETERIZATION WITH CORONARY ANGIOGRAM;  Surgeon: Candee Furbish, MD;  Location: [facility] CATH LAB;  Service: Cardiovascular;  Laterality: N/A;   rotator cuff surgery     right    skin cancer removed      Family History  Problem Relation Age of Onset   Heart disease Mother    Stroke Father    Cancer Brother     Social History:  reports that he quit smoking about 35 years ago. His smoking use included cigarettes. He has a 45.00 pack-year smoking history. He has never used smokeless tobacco. He reports that he does not drink alcohol and does not use drugs.  Review of Systems:  HYPERTENSION:  followed by PCP and cardiologist and blood pressure is better  today  He checks his blood pressure at home regularly also  BP Readings from Last 3 Encounters:  01/02/20 140/90  08/23/19 122/70  07/10/19 (!) 162/98    RENAL dysfunction: Has mild persistent increase in creatinine with some fluctuation  Lab Results  Component Value Date   CREATININE 1.44 12/21/2019   CREATININE 1.37 08/21/2019   CREATININE 1.61 (H) 05/15/2019     HYPERLIPIDEMIA: The lipid abnormality consists of elevated LDL and low HDL treated with Crestor  Stable taking Crestor 20 mg Levels are as below  Lipids followed by PCP and cardiologist    Lab Results  Component Value Date   CHOL 126 08/21/2019   HDL 38.40 (L) 08/21/2019   LDLCALC 50 08/21/2019   TRIG 187.0 (H) 08/21/2019   CHOLHDL 3 08/21/2019    Thyroid nodule: This has been stable and small with previous evaluations  Had a follow-up up  ultrasound in December 2017 done by his PCP, this was stable He also has a 12 mm probably pseudo-nodule present     Examination:   BP 140/90    Pulse 64    Ht 5\' 4"  (1.626 m)    Wt 184 lb (83.5 kg)    SpO2 96%    BMI 31.58 kg/m   Body mass index is 31.58 kg/m.  Marland Kitchen No ankle edema present   ASSESSMENT/ PLAN:   Diabetes type 2 with mild obesity   See history of present illness for detailed discussion of current diabetes management, blood  sugar patterns and problems identified  A1c is stable a 6.4  This is with Metformin 500 mg twice daily He is trying to be more active Blood sugars are only rarely high based on his diet Overall considering his age and comorbid conditions his blood sugar levels are still stable and showing no progression of his diabetes He has occasional diarrhea from Metformin but this is not significant Currently renal function is stable enough to continue this   HYPERTENSION: Blood pressure is well controlled  Renal function relatively better without any change in medications, may be better hydrated recently  Follow-up in 4 months      There are no Patient Instructions on file for this visit.    Elayne Snare 01/02/2020, 10:31 AM                 Patient ID: Cody Thomas, male   DOB: 06/27/1930, 86 y.o.   MRN: [id number]   Reason for Appointment: Diabetes follow-up   History of Present Illness   Diagnosis: Type 2 DIABETES MELITUS  He has had mild diabetes which has been previously erratic partly because of dietary inconsistency and difficulty with weight loss He has not been continued on metformin because of renal dysfunction Was given low-dose Januvia instead but this was too expensive for him even though it was helping his control In 2014 he was given low dose Amaryl instead which appears to be keeping his blood sugars controlled  His blood sugars were not well controlled when he was seen in 1/16 with A1c 7.8  Subsequently Amaryl was stopped partly because of mild hypoglycemia  100 recent history:  Since 1/16 he has been on metformin ER and he is  taking 500 mg twice daily  A1c is stable at 6.4  Previous range 6.2-8 . Current blood sugar patterns, problems and management:  He checks his blood sugars somewhat irregularly  He is taking his Metformin twice a day although he thinks that occasionally he may have diarrhea which he has not mentioned before  However his creatinine is relatively better now  He did have a high reading 1 night and this was from eating a candy bar  As before he may be occasionally inconsistent with diet although weight is about the same  His blood sugar in the lab was 107  He is starting to be a little more active basically with gardening and outside work  Monitors blood glucose: Once a day or less .    Glucometer:   Accu-Chek         Blood Glucose readings and averages from download   PRE-MEAL Fasting Lunch Dinner Bedtime Overall  Glucose range:  110-184   99-113  143, 175   Mean/median:      130   POST-MEAL PC Breakfast PC Lunch PC Dinner  Glucose range:     124, 123  Mean/median:      Previous readings:  PRE-MEAL Fasting Lunch Dinner Bedtime Overall  Glucose range:  98   101    Mean/median:     115   POST-MEAL PC Breakfast PC Lunch PC Dinner  Glucose range:  117   143  Mean/median:        Dietitian consultation last done several years ago  Breakfast meals: Sometimes Cheerios cereal, oatmeal, sometimes an egg, toast and apple butter May have cookies for snacks   Wt Readings from Last 3 Encounters:  01/02/20 184 lb (83.5 kg)  08/23/19 180 lb 9.6 oz (81.9 kg)  08/03/19 178 lb (80.7 kg)   DM LABS:  Lab Results  Component Value Date   HGBA1C 6.6 (H) 12/21/2019   HGBA1C 6.4 08/21/2019   HGBA1C 6.3 05/15/2019   Lab Results  Component Value Date   MICROALBUR <0.7 05/15/2019   LDLCALC 50 08/21/2019   CREATININE 1.44 12/21/2019   No visits with results within 1 Week(s) from this visit.  Latest known visit with results is:  Lab on 12/21/2019  Component Date Value Ref Range Status   Sodium 12/21/2019 139  135 - 145 mEq/L Final   Potassium 12/21/2019 4.0  3.5 - 5.1 mEq/L Final   Chloride 12/21/2019 102  96 - 112 mEq/L Final   CO2 12/21/2019 30  19 - 32 mEq/L Final   Glucose, Bld 12/21/2019 112* 70 - 99 mg/dL Final   BUN 12/21/2019 23  6 - 23 mg/dL Final   Creatinine, Ser 12/21/2019 1.44  0.40 - 1.50 mg/dL Final   GFR 12/21/2019 47.00* >60.00 mL/min Corrected   Calcium 12/21/2019 9.6  8.4 - 10.5 mg/dL Final   Hgb A1c MFr Bld 12/21/2019 6.6* 4.6 - 6.5 % Final   Glycemic Control Guidelines for People with Diabetes:Non Diabetic:  <6%Goal of Therapy: <7%Additional Action Suggested:  >8%      Allergies as of 01/02/2020      Reactions   Crestor [rosuvastatin]    Nightmares in high dosage   Sertraline Diarrhea   Zocor [simvastatin]    Nightmares in high dosage      Medication List       Accurate as of January 02, 2020 10:32 AM. If you have any questions, ask your nurse or doctor.        b complex vitamins  capsule Take 1 capsule by mouth daily.   calcium carbonate 500 MG chewable tablet Commonly known as: TUMS - dosed in mg elemental calcium Chew 2 tablets by mouth daily as needed. For indigestion.   Coenzyme Q10-Vitamin E 100-1 MG-UNT/5ML Syrp Take 100 mg by mouth daily. Take 2 tablespoons (100MG ) Daily   fish oil-omega-3 fatty acids 1000 MG capsule Take 1,200 mg by mouth daily. 4 1200 tablets daily   fluticasone 50 MCG/ACT nasal spray Commonly known as: FLONASE Place 1 spray into both nostrils daily as needed for allergies or rhinitis.   glucose blood test strip Use Accu Chek Smart test strips as instructed to check blood sugar once daily. DX:E11.65   LORazepam 0.5 MG tablet Commonly known as: ATIVAN Take 0.25-0.5 mg by mouth daily as needed for anxiety.   losartan-hydrochlorothiazide 100-25 MG tablet Commonly known as: HYZAAR Take 1 tablet by mouth daily.   metFORMIN 500 MG 24 hr tablet Commonly known as: GLUCOPHAGE-XR TAKE 1 TABLET BY MOUTH TWICE DAILY   metoprolol tartrate 25 MG tablet Commonly known as: LOPRESSOR TAKE 1 TABLET BY MOUTH TWICE DAILY   rosuvastatin 20 MG tablet Commonly known as: CRESTOR Take 20 mg by mouth See admin instructions. Take 1 tablet by mouth three times weekly.   traMADol 50 MG tablet Commonly known as: ULTRAM TAKE 1 TABLET BY MOUTH EVERY 6 HOURS AS NEEDED   warfarin 5 MG tablet Commonly known as: COUMADIN Take as directed by the anticoagulation clinic. If you are unsure how to take this medication, talk to your nurse or doctor. Original instructions: TAKE 1 TO 1 AND 1/2 TABLETS BY MOUTH EVERY DAY AS DIRECTED BY THE COUMADIN CLINIC       Allergies:  Allergies  Allergen Reactions   Crestor [  Rosuvastatin]     Nightmares in high dosage   Sertraline Diarrhea   Zocor [Simvastatin]     Nightmares in high dosage    Past Medical History:  Diagnosis Date   Anxiety    Bunion 09/29/2011   Cancer ([place])    nose/Dr.  Albertini;skin   Chronic constipation    Chronic kidney disease    stage 111   Coronary atherosclerosis of native coronary artery    2009 LAD CIRC DES   Depression    doesn't take any meds for this   Diabetes mellitus    takes Januvia daily   Dizziness    Enlarged prostate    but doesn't require meds at present   GERD (gastroesophageal reflux disease)    TUMS prn   H/O hiatal hernia    Headache(784.0)    History of gout    doesn't require meds   Hx of cardiac cath    Hyperlipidemia    Crestor 3 x wk and Zetia daily   Hypertension    takes Hyzaar daily   Insomnia    doesn't require meds at present time   Joint pain    Onychomycosis 10/05/2012   x 10   PONV (postoperative nausea and vomiting)    pt states extremely sick   Shortness of breath    with exertion    Past Surgical History:  Procedure Laterality Date   bladder cancer     CARDIAC CATHETERIZATION  2013   cataracts removed     CHOLECYSTECTOMY     COLONOSCOPY     CORONARY ANGIOPLASTY     2 stents from 2009   [place]  10/29/2011   Procedure: CORONARY ARTERY BYPASS GRAFTING (CABG);  Surgeon: Ivin Poot, MD;  Location: [place];  Service: Open Heart Surgery;  Laterality: N/A;   cyst removed from finger     right hand   EYE SURGERY     bilateral cataracts   HEMORRHOID SURGERY     HERNIA REPAIR     x 2;inguinal   KIDNEY STONE SURGERY     LEFT HEART CATHETERIZATION WITH CORONARY ANGIOGRAM N/A 10/21/2011   Procedure: LEFT HEART CATHETERIZATION WITH CORONARY ANGIOGRAM;  Surgeon: Candee Furbish, MD;  Location: [facility] CATH LAB;  Service: Cardiovascular;  Laterality: N/A;   rotator cuff surgery     right    skin cancer removed      Family History  Problem Relation Age of Onset   Heart disease Mother    Stroke Father    Cancer Brother     Social History:  reports that he quit smoking about 35 years ago. His smoking use included cigarettes. He has a 45.00  pack-year smoking history. He has never used smokeless tobacco. He reports that he does not drink alcohol and does not use drugs.  Review of Systems:  HYPERTENSION:  followed by PCP and cardiologist and blood pressure is higher today He thinks this is from not sleeping well at night  He checks his blood pressure at home regularly   BP Readings from Last 3 Encounters:  01/02/20 140/90  08/23/19 122/70  07/10/19 (!) 162/98    RENAL dysfunction: Has mild persistent increase in creatinine as below  Lab Results  Component Value Date   CREATININE 1.44 12/21/2019   CREATININE 1.37 08/21/2019   CREATININE 1.61 (H) 05/15/2019     HYPERLIPIDEMIA: The lipid abnormality consists of elevated LDL and low HDL treated with Crestor  Stable taking Crestor  20 mg Levels are as below  Lipids followed by PCP and cardiologist    Lab Results  Component Value Date   CHOL 126 08/21/2019   HDL 38.40 (L) 08/21/2019   LDLCALC 50 08/21/2019   TRIG 187.0 (H) 08/21/2019   CHOLHDL 3 08/21/2019    Thyroid nodule: This has been stable and small with previous evaluations  Had a follow-up up  ultrasound in December 2017 done by his PCP, this was stable He also has a 12 mm probably pseudo-nodule present     Examination:   BP 140/90    Pulse 64    Ht 5\' 4"  (1.626 m)    Wt 184 lb (83.5 kg)    SpO2 96%    BMI 31.58 kg/m   Body mass index is 31.58 kg/m.  Marland Kitchen No lower leg edema present   ASSESSMENT/ PLAN:   Diabetes type 2 with mild obesity   See history of present illness for detailed discussion of current diabetes management, blood sugar patterns and problems identified  A1c is still reasonably good at 6.6  Has been on long-term medications with Metformin 500 mg twice daily Although he has periodic high readings this is likely from inconsistent diet and sweets However considering his age his level of control is adequate He is having minimal diarrhea with Metformin and is agreeable to  continuing this Renal function adequate   HYPERTENSION: Blood pressure is higher and he will start checking at home more regularly   Follow-up in 6 months     There are no Patient Instructions on file for this visit.    Elayne Snare 01/02/2020, 10:32 AM

## 2020-01-03 NOTE — Progress Notes (Signed)
Cardiology Office Note   Date:  01/04/2020   ID:  Cody Thomas, DOB [birth year], MRN [id number]  PCP:  Juluis Rainier, MD  Cardiologist:  Dr Anne Fu     Chief Complaint  Patient presents with  . Coronary Artery Disease  . Atrial Fibrillation      History of Present Illness: Cody Thomas is a 86 y.o. male who presents for CAD and PAF.    CABG in 2013. Brief postop A. fib. Subsequently he had an episode of atrial fibrillation with occasional bradycardia with CHA2DS2-VASc score of 5. Eliquis was started. Stated that he was unable to afford Eliquis. Now on coumadin.  Previously had an episode of near syncope felt like he was going to pass out sitting in the chair. Not long. Did not fall over. Never felt like this before.  Has not had any further syncopal-like episodes  Event monitor 05/26/19:  Multiple episodes atrial flutter with variable conduction  Multiple episodes of non sustained ventricular tachycardia (longest 9 seconds)  Pause greater than 3 seconds (during conversion of atrial flutter to NSR)  Recent echo 03/19/2019 shows normal ejection fraction.  Please refer to EP for evaluation of near syncope in the context of findings from monitor.  1986 dx with palpitations (heart trying to add a beat, he states) Dr. Janey Greaser. Gave up smoking cold [place]. Felt these palpitations while camping at the beach.   He also had Covid infection January 8 with Ab treatment.  He has seen EP Dr. Graciela Husbands.  Plan to follow along, he-the pt  did not want loop recorder at this time.  No syncope occ with stress feels skipped beats.  His BP is elevated today but at home it runs 130/60 - he has his continued episodes of Rt of sternum discomfort after working in yard.  This has been occurring for years.  No changes.  No bleeding. No significant SOB just his usual.      Past Medical History:  Diagnosis Date  . Anxiety   . Bunion 09/29/2011  . Cancer (HCC)    nose/Dr.  Albertini;skin  . Chronic constipation   . Chronic kidney disease    stage 111  . Coronary atherosclerosis of native coronary artery    2009 LAD CIRC DES  . Depression    doesn't take any meds for this  . Diabetes mellitus    takes Januvia daily  . Dizziness   . Enlarged prostate    but doesn't require meds at present  . GERD (gastroesophageal reflux disease)    TUMS prn  . H/O hiatal hernia   . Headache(784.0)   . History of gout    doesn't require meds  . Hx of cardiac cath   . Hyperlipidemia    Crestor 3 x wk and Zetia daily  . Hypertension    takes Hyzaar daily  . Insomnia    doesn't require meds at present time  . Joint pain   . Onychomycosis 10/05/2012   x 10  . PONV (postoperative nausea and vomiting)    pt states extremely sick  . Shortness of breath    with exertion    Past Surgical History:  Procedure Laterality Date  . bladder cancer    . CARDIAC CATHETERIZATION  2013  . cataracts removed    . CHOLECYSTECTOMY    . COLONOSCOPY    . CORONARY ANGIOPLASTY     2 stents from 2009  . CORONARY ARTERY BYPASS GRAFT  10/29/2011   Procedure:  CORONARY ARTERY BYPASS GRAFTING (CABG);  Surgeon: Kerin Perna, MD;  Location: [facility] OR;  Service: Open Heart Surgery;  Laterality: N/A;  . cyst removed from finger     right hand  . EYE SURGERY     bilateral cataracts  . HEMORRHOID SURGERY    . HERNIA REPAIR     x 2;inguinal  . KIDNEY STONE SURGERY    . LEFT HEART CATHETERIZATION WITH CORONARY ANGIOGRAM N/A 10/21/2011   Procedure: LEFT HEART CATHETERIZATION WITH CORONARY ANGIOGRAM;  Surgeon: Donato Schultz, MD;  Location[facility] C[facility] LAB;  Service: Cardiovascular;  Laterality: N/A;  . rotator cuff surgery     right   . skin cancer removed       Current Outpatient Medications  Medication Sig Dispense Refill  . b complex vitamins capsule Take 1 capsule by mouth daily.    . calcium carbonate (TUMS - DOSED IN MG ELEMENTAL CALCIUM) 500 MG chewable tablet Chew 2 tablets by mouth  daily as needed. For indigestion.     . Coenzyme Q10-Vitamin E 100-1 MG-UNT/5ML SYRP Take 100 mg by mouth daily. Take 2 tablespoons (100MG ) Daily    . fish oil-omega-3 fatty acids 1000 MG capsule Take 1,200 mg by mouth daily. 4 1200 tablets daily    . fluticasone (FLONASE) 50 MCG/ACT nasal spray Place 1 spray into both nostrils daily as needed for allergies or rhinitis.    Marland Kitchen glucose blood test strip Use Accu Chek Smart test strips as instructed to check blood sugar once daily. DX:E11.65 100 each 3  . LORazepam (ATIVAN) 0.5 MG tablet Take 0.25-0.5 mg by mouth daily as needed for anxiety.   0  . losartan-hydrochlorothiazide (HYZAAR) 100-25 MG tablet Take 1 tablet by mouth daily. 90 tablet 3  . metFORMIN (GLUCOPHAGE-XR) 500 MG 24 hr tablet TAKE 1 TABLET BY MOUTH TWICE DAILY 180 tablet 1  . metoprolol tartrate (LOPRESSOR) 25 MG tablet TAKE 1 TABLET BY MOUTH TWICE DAILY 180 tablet 3  . rosuvastatin (CRESTOR) 20 MG tablet Take 20 mg by mouth See admin instructions. Take 1 tablet by mouth three times weekly.    . traMADol (ULTRAM) 50 MG tablet TAKE 1 TABLET BY MOUTH EVERY 6 HOURS AS NEEDED 30 tablet 0  . warfarin (COUMADIN) 5 MG tablet TAKE 1 TO 1 AND 1/2 TABLETS BY MOUTH EVERY DAY AS DIRECTED BY THE COUMADIN CLINIC 135 tablet 1   No current facility-administered medications for this visit.    Allergies:   Crestor [rosuvastatin], Sertraline, and Zocor [simvastatin]    Social History:  The patient  reports that he quit smoking about 35 years ago. His smoking use included cigarettes. He has a 45.00 pack-year smoking history. He has never used smokeless tobacco. He reports that he does not drink alcohol and does not use drugs.   Family History:  The patient's family history includes Cancer in his brother; Heart disease in his mother; Stroke in his father.    ROS:  General:no colds or fevers, no weight changes Skin:no rashes or ulcers HEENT:no blurred vision, no congestion CV:see HPI PUL:see  HPI GI:no diarrhea constipation or melena, no indigestion GU:no hematuria, no dysuria MS:no joint pain, no claudication Neuro:no syncope, no lightheadedness Endo:+ diabetes, no thyroid disease  Wt Readings from Last 3 Encounters:  01/04/20 183 lb 9.6 oz (83.3 kg)  01/02/20 184 lb (83.5 kg)  08/23/19 180 lb 9.6 oz (81.9 kg)     PHYSICAL EXAM: VS:  BP (!) 170/76   Pulse (!) 55  Ht 5\' 4"  (1.626 m)   Wt 183 lb 9.6 oz (83.3 kg)   SpO2 90%   BMI 31.51 kg/m  , BMI Body mass index is 31.51 kg/m. General:Pleasant affect, NAD Skin:Warm and dry, brisk capillary refill HEENT:normocephalic, sclera clear, mucus membranes moist Neck:supple, no JVD, no bruits  Heart:S1S2 RRR without murmur, gallup, rub or click Lungs:clear without rales, rhonchi, or wheezes NWG:NFAO, non tender, + BS, do not palpate liver spleen or masses Ext:no lower ext edema, 2+ pedal pulses, 2+ radial pulses Neuro:alert and oriented, MAE, follows commands, + facial symmetry    EKG:  EKG is ordered today. The ekg ordered today demonstrates SR with non conducted PACs and no acute changes. 1st degree AV block as well.    Recent Labs: 03/18/2019: B Natriuretic Peptide 786.5 03/19/2019: TSH 2.224 03/20/2019: Hemoglobin 15.2; Platelets 169 08/21/2019: ALT 17 12/21/2019: BUN 23; Creatinine, Ser 1.44; Potassium 4.0; Sodium 139    Lipid Panel    Component Value Date/Time   CHOL 126 08/21/2019 0855   TRIG 187.0 (H) 08/21/2019 0855   HDL 38.40 (L) 08/21/2019 0855   CHOLHDL 3 08/21/2019 0855   VLDL 37.4 08/21/2019 0855   LDLCALC 50 08/21/2019 0855       Other studies Reviewed: Additional studies/ records that were reviewed today include: . Event monitor Study Highlights    Multiple episodes atrial flutter with variable conduction  Multiple episodes of non sustained ventricular tachycardia (longest 9 seconds)  Pause greater than 3 seconds (during conversion of atrial flutter to NSR)   Please refer to EP  for evaluation of near syncope in the context of findings from monitor. He is on Eliquis  Donato Schultz, MD   Echo 03/19/19 IMPRESSIONS    1. Left ventricular ejection fraction, by visual estimation, is 60 to  65%. The left ventricle has normal function. Left ventricular septal wall  thickness was mildly increased. There is mildly increased left ventricular  hypertrophy.  2. The left ventricle has no regional wall motion abnormalities.  3. Global right ventricle has mildly reduced systolic function.The right  ventricular size is mildly enlarged. No increase in right ventricular wall  thickness.  4. Left atrial size was normal.  5. Right atrial size was normal.  6. Mild mitral annular calcification.  7. Moderate calcification of the mitral valve leaflet(s).  8. Moderate thickening of the mitral valve leaflet(s).  9. The mitral valve is degenerative. Mild mitral valve regurgitation. No  evidence of mitral stenosis.  10. The tricuspid valve is normal in structure.  11. The aortic valve is tricuspid. Aortic valve regurgitation is trivial.  Mild aortic valve sclerosis without stenosis.  12. Pulmonic regurgitation is mild.  13. The pulmonic valve was not well visualized. Pulmonic valve  regurgitation is mild.  14. Normal pulmonary artery systolic pressure.  15. The inferior vena cava is normal in size with greater than 50%  respiratory variability, suggesting right atrial pressure of 3 mmHg.   FINDINGS  Left Ventricle: Left ventricular ejection fraction, by visual estimation,  is 60 to 65%. The left ventricle has normal function. The left ventricle  has no regional wall motion abnormalities. There is mildly increased left  ventricular hypertrophy. Normal  left atrial pressure.   Right Ventricle: The right ventricular size is mildly enlarged. No  increase in right ventricular wall thickness. Global RV systolic function  is has mildly reduced systolic function. The tricuspid  regurgitant  velocity is 2.32 m/s, and with an assumed  right atrial pressure of  3 mmHg, the estimated right ventricular systolic  pressure is normal at 24.5 mmHg.   Left Atrium: Left atrial size was normal in size.   Right Atrium: Right atrial size was normal in size   Pericardium: There is no evidence of pericardial effusion.   Mitral Valve: The mitral valve is degenerative in appearance. There is  moderate thickening of the mitral valve leaflet(s). There is moderate  calcification of the mitral valve leaflet(s). Mild mitral annular  calcification. Mild mitral valve regurgitation.  No evidence of mitral valve stenosis by observation.   Tricuspid Valve: The tricuspid valve is normal in structure. Tricuspid  valve regurgitation is mild.   Aortic Valve: The aortic valve is tricuspid. Aortic valve regurgitation is  trivial. Mild aortic valve sclerosis is present, with no evidence of  aortic valve stenosis.   Pulmonic Valve: The pulmonic valve was not well visualized. Pulmonic valve  regurgitation is mild. Pulmonic regurgitation is mild.   Aorta: The aortic root, ascending aorta and aortic arch are all  structurally normal, with no evidence of dilitation or obstruction.   Venous: The inferior vena cava is normal in size with greater than 50%  respiratory variability, suggesting right atrial pressure of 3 mmHg.    ASSESSMENT AND PLAN:  1.  CAD with hx CABG and no angina.  Atypical chest pain thought to be muscular.  Continue BB and coumadin  Follow up in 6 months with Dr. Anne Fu and prn  2. PAF maintaining SR.  On coumadin no bleeding.   3.  First degree AV block, Mobitz 1 heart block nocturnal.  And sinus brady all stable if symptoms increase Dr. Graciela Husbands suggested loop recorder, but no freq episodes and no syncope also with atrial tachy with failure to condut antegrade, VT non sustained.  4. HLD with good control LDL 50 and HDL 38 continue statin -crestor  5.  SM-2 per  Dr.Kumar, notes reviewed.    6.  HTN elevated, he tells me has has white coat syndrome.  He will call in BPs Monday after taking over the weekend.    7.  OSA on CPAP and uses      Current medicines are reviewed with the patient today.  The patient Has no concerns regarding medicines.  The following changes have been made:  See above Labs/ tests ordered today include:see above  Disposition:   FU:  see above  Signed, Nada Boozer, NP  01/04/2020 9:46 AM    [facility] [address] [place], [place], [place]  [address]/ [address] [place], [place] Phone: [phone]; Fax: [phone]  [phone]

## 2020-01-04 ENCOUNTER — Encounter: Payer: Self-pay | Admitting: Cardiology

## 2020-01-04 ENCOUNTER — Other Ambulatory Visit: Payer: Self-pay

## 2020-01-04 ENCOUNTER — Ambulatory Visit: Payer: Medicare Other | Admitting: Cardiology

## 2020-01-04 VITALS — BP 170/76 | HR 55 | Ht 64.0 in | Wt 183.6 lb

## 2020-01-04 DIAGNOSIS — I480 Paroxysmal atrial fibrillation: Secondary | ICD-10-CM | POA: Diagnosis not present

## 2020-01-04 DIAGNOSIS — R001 Bradycardia, unspecified: Secondary | ICD-10-CM | POA: Diagnosis not present

## 2020-01-04 DIAGNOSIS — I10 Essential (primary) hypertension: Secondary | ICD-10-CM | POA: Diagnosis not present

## 2020-01-04 DIAGNOSIS — I2510 Atherosclerotic heart disease of native coronary artery without angina pectoris: Secondary | ICD-10-CM

## 2020-01-04 DIAGNOSIS — E119 Type 2 diabetes mellitus without complications: Secondary | ICD-10-CM | POA: Diagnosis not present

## 2020-01-04 DIAGNOSIS — Z7901 Long term (current) use of anticoagulants: Secondary | ICD-10-CM

## 2020-01-04 DIAGNOSIS — G4733 Obstructive sleep apnea (adult) (pediatric): Secondary | ICD-10-CM

## 2020-01-04 DIAGNOSIS — E782 Mixed hyperlipidemia: Secondary | ICD-10-CM

## 2020-01-04 NOTE — Patient Instructions (Signed)
Medication Instructions:  NO CHANGES *If you need a refill on your cardiac medications before your next appointment, please call your pharmacy*   Lab Work: NONE If you have labs (blood work) drawn today and your tests are completely normal, you will receive your results only by:  [place] (if you have MyChart) OR  A paper copy in the mail If you have any lab test that is abnormal or we need to change your treatment, we will call you to review the results.   Testing/Procedures: NONE   Follow-Up: At [facility], you and your health needs are our priority.  As part of our continuing mission to provide you with exceptional heart care, we have created designated Provider Care Teams.  These Care Teams include your primary Cardiologist (physician) and Advanced Practice Providers (APPs -  Physician Assistants and Nurse Practitioners) who all work together to provide you with the care you need, when you need it.  We recommend signing up for the patient portal called "MyChart".  Sign up information is provided on this After Visit Summary.  MyChart is used to connect with patients for Virtual Visits (Telemedicine).  Patients are able to view lab/test results, encounter notes, upcoming appointments, etc.  Non-urgent messages can be sent to your provider as well.   To learn more about what you can do with MyChart, go to [email or web address].    Your next appointment:   6 month(s)  The format for your next appointment:   In Person  Provider:   DR Candee Furbish   Other Instructions

## 2020-01-16 NOTE — Progress Notes (Signed)
01/17/2019- 88 yoM former smoker (2PPD until 1986) establishing for OSA, complicated by hx lung nodules, BPH, Bladder Cancer, CKD 3, Low back pain/ sciatica, CAD, GERD, HBP, DM2  ,  NPSG 12/03/16- AHI 7.3/ hr, desaturation to 82%, body weight 184 lbs Epworth score 4 CPAP auto 5-15/ [place] (2018)  >> today auto 5-12 Body weight 184 lbs He stopped using CPAP when he got Shingles on face Jan, 2020. He was noting leaks at his full-face mask and hose.  Denies problems with shortness of breath, cough or wheeze. Had flu vax.  01/17/20- 89 yoM former smoker (2PPD until 1986) followed  for OSA, complicated by hx lung nodules, BPH, Bladder Cancer, CKD 3, Low back pain/ sciatica, CAD/CABG, GERD, HBP, DM2 , Covid infection Jan,2021,  AFib,  CPAP 5-12/ [place]-  Body weight today- 181 lbs Covid vax-2 Phizer Flu vax- had -----cpap compliance He has not been using CPAP consistently. He asked how it would help him- reviewed again. He had mild OSA on NPSG. I gave him option to dc CPAP and he expressed desire to use it if it is good for him. Bothersome mask leak.  He had Covid infection/remdesivir then hosp with AFib in January.  CXR 03/18/19- No focal consolidation, pleural effusion or pneumothorax. The cardiac silhouette is within normal limits. Median sternotomy wires and CABG vascular clips. Atherosclerotic calcification of the aorta. The aorta is tortuous. No acute osseous pathology. IMPRESSION: No active cardiopulmonary disease.  ROS-see HPI   + = positive Constitutional:    weight loss, night sweats, fevers, chills, fatigue, lassitude. HEENT:    headaches, difficulty swallowing, tooth/dental problems, sore throat,       sneezing, itching, ear ache, +nasal congestion, post nasal drip, snoring CV:    chest pain, orthopnea, PND, swelling in lower extremities, anasarca,                                  dizziness, palpitations Resp:   shortness of breath with exertion  or at rest.                productive cough,   non-productive cough, coughing up of blood.              change in color of mucus.  wheezing.   Skin:    rash or lesions. GI: + heartburn, +indigestion, abdominal pain, nausea, vomiting, diarrhea,                 change in bowel habits, loss of appetite GU: dysuria, change in color of urine, no urgency or frequency.   flank pain. MS:   +joint pain, +stiffness, decreased range of motion, back pain. Neuro-     nothing unusual Psych:  change in mood or affect.  depression or +anxiety.   memory loss.  OBJ- Physical Exam General- Alert, Oriented, Affect-appropriate, Distress- none acute Skin- rash-none, lesions- none, excoriation- none Lymphadenopathy- none Head- atraumatic            Eyes- Gross vision intact, PERRLA, conjunctivae and secretions clear            Ears- Hearing, canals-normal            Nose- Clear, no-Septal dev, mucus, polyps, erosion, perforation             Throat- Mallampati III , mucosa clear , drainage- none, tonsils- atrophic,  + dentures Neck- flexible , trachea midline, no  stridor , thyroid nl, carotid no bruit Chest - symmetrical excursion , unlabored           Heart/CV- RRR , no murmur , no gallop  , no rub, nl s1 s2                           - JVD- none , edema- none, stasis changes- none, varices- none           Lung- clear to P&A, wheeze- none, cough- none , dullness-none, rub- none           Chest wall-  Abd-  Br/ Gen/ Rectal- Not done, not indicated Extrem- cyanosis- none, clubbing, none, atrophy- none, strength- nl Neuro- grossly intact to observation

## 2020-01-17 ENCOUNTER — Ambulatory Visit: Payer: Medicare Other | Admitting: Internal Medicine

## 2020-01-17 ENCOUNTER — Other Ambulatory Visit: Payer: Self-pay

## 2020-01-17 ENCOUNTER — Encounter: Payer: Self-pay | Admitting: Internal Medicine

## 2020-01-17 VITALS — BP 140/80 | HR 71 | Temp 97.5°F | Ht 64.0 in | Wt 181.4 lb

## 2020-01-17 DIAGNOSIS — I480 Paroxysmal atrial fibrillation: Secondary | ICD-10-CM

## 2020-01-17 DIAGNOSIS — G4733 Obstructive sleep apnea (adult) (pediatric): Secondary | ICD-10-CM

## 2020-01-17 NOTE — Assessment & Plan Note (Signed)
He had only mild OSA. At age 86 I gave him option to stop CPAP, but with hx now of AFib, he is better off sticking with it, which is what he chooses to do.  We reviewed compliance goals.  Plan- continue auto 5-12, refit mask. Emphasize compliance goals.

## 2020-01-17 NOTE — Assessment & Plan Note (Signed)
Feels like RSR on exam at this visit. Followed by cardiology.

## 2020-01-17 NOTE — Patient Instructions (Signed)
Order- DME  [facility]  Please continue CPAP auto 5-12, mask of choice, humidifier, supplies, Airview/ card. Please refit mask  Cody Thomas- I do think wearing CPAP is likely to help protect your breathing while you sleep,  If you are able to keep wearing it, our goal is to wear it at least 4 hours per night.   Please call if we can help

## 2020-01-23 DIAGNOSIS — G4733 Obstructive sleep apnea (adult) (pediatric): Secondary | ICD-10-CM | POA: Diagnosis not present

## 2020-01-30 ENCOUNTER — Other Ambulatory Visit: Payer: Self-pay

## 2020-01-30 ENCOUNTER — Ambulatory Visit: Payer: Medicare Other

## 2020-01-30 DIAGNOSIS — I480 Paroxysmal atrial fibrillation: Secondary | ICD-10-CM

## 2020-01-30 LAB — POCT INR: INR: 2.5 (ref 2.0–3.0)

## 2020-01-30 MED ORDER — WARFARIN SODIUM 5 MG PO TABS
ORAL_TABLET | ORAL | 1 refills | Status: DC
Start: 2020-01-30 — End: 2020-03-12

## 2020-01-30 NOTE — Patient Instructions (Signed)
Description   Continue taking Warfarin 1.5 tablets daily except for 1 tablet on Mondays and Fridays.  Recheck INR in 6 weeks. Anticoagulation Clinic [phone], Main Number [phone]

## 2020-02-22 ENCOUNTER — Other Ambulatory Visit: Payer: Self-pay | Admitting: Endocrinology

## 2020-03-04 ENCOUNTER — Ambulatory Visit: Payer: Medicare Other | Admitting: Podiatry

## 2020-03-06 DIAGNOSIS — R059 Cough, unspecified: Secondary | ICD-10-CM | POA: Diagnosis not present

## 2020-03-06 DIAGNOSIS — U071 COVID-19: Secondary | ICD-10-CM | POA: Diagnosis not present

## 2020-03-12 ENCOUNTER — Ambulatory Visit (INDEPENDENT_AMBULATORY_CARE_PROVIDER_SITE_OTHER): Payer: Medicare Other | Admitting: Podiatry

## 2020-03-12 ENCOUNTER — Encounter: Payer: Self-pay | Admitting: Podiatry

## 2020-03-12 ENCOUNTER — Ambulatory Visit (INDEPENDENT_AMBULATORY_CARE_PROVIDER_SITE_OTHER): Payer: Medicare Other | Admitting: *Deleted

## 2020-03-12 ENCOUNTER — Other Ambulatory Visit: Payer: Self-pay

## 2020-03-12 DIAGNOSIS — I480 Paroxysmal atrial fibrillation: Secondary | ICD-10-CM | POA: Diagnosis not present

## 2020-03-12 DIAGNOSIS — J449 Chronic obstructive pulmonary disease, unspecified: Secondary | ICD-10-CM | POA: Insufficient documentation

## 2020-03-12 DIAGNOSIS — B351 Tinea unguium: Secondary | ICD-10-CM | POA: Diagnosis not present

## 2020-03-12 DIAGNOSIS — Z951 Presence of aortocoronary bypass graft: Secondary | ICD-10-CM | POA: Insufficient documentation

## 2020-03-12 DIAGNOSIS — M2012 Hallux valgus (acquired), left foot: Secondary | ICD-10-CM

## 2020-03-12 DIAGNOSIS — M79674 Pain in right toe(s): Secondary | ICD-10-CM

## 2020-03-12 DIAGNOSIS — Z5181 Encounter for therapeutic drug level monitoring: Secondary | ICD-10-CM

## 2020-03-12 DIAGNOSIS — F419 Anxiety disorder, unspecified: Secondary | ICD-10-CM | POA: Insufficient documentation

## 2020-03-12 DIAGNOSIS — Z8589 Personal history of malignant neoplasm of other organs and systems: Secondary | ICD-10-CM | POA: Insufficient documentation

## 2020-03-12 DIAGNOSIS — F339 Major depressive disorder, recurrent, unspecified: Secondary | ICD-10-CM | POA: Insufficient documentation

## 2020-03-12 DIAGNOSIS — E119 Type 2 diabetes mellitus without complications: Secondary | ICD-10-CM

## 2020-03-12 DIAGNOSIS — Z8679 Personal history of other diseases of the circulatory system: Secondary | ICD-10-CM | POA: Insufficient documentation

## 2020-03-12 DIAGNOSIS — M79675 Pain in left toe(s): Secondary | ICD-10-CM

## 2020-03-12 DIAGNOSIS — K219 Gastro-esophageal reflux disease without esophagitis: Secondary | ICD-10-CM | POA: Insufficient documentation

## 2020-03-12 DIAGNOSIS — E1121 Type 2 diabetes mellitus with diabetic nephropathy: Secondary | ICD-10-CM | POA: Insufficient documentation

## 2020-03-12 DIAGNOSIS — G4734 Idiopathic sleep related nonobstructive alveolar hypoventilation: Secondary | ICD-10-CM | POA: Insufficient documentation

## 2020-03-12 DIAGNOSIS — Z8616 Personal history of COVID-19: Secondary | ICD-10-CM | POA: Insufficient documentation

## 2020-03-12 DIAGNOSIS — M2011 Hallux valgus (acquired), right foot: Secondary | ICD-10-CM

## 2020-03-12 LAB — POCT INR: INR: 3.3 — AB (ref 2.0–3.0)

## 2020-03-12 NOTE — Patient Instructions (Addendum)
Description   Hold warfarin today, then continue taking Warfarin 1.5 tablets daily except for 1 tablet on Mondays and Fridays.  Recheck INR in 3 weeks. Anticoagulation Clinic [phone], Main Number [phone]

## 2020-03-15 NOTE — Progress Notes (Signed)
Subjective: Cody Thomas presents today for preventative diabetic foot care, for diabetic foot evaluation and painful mycotic nails b/l that are difficult to trim. Pain interferes with ambulation. Aggravating factors include wearing enclosed shoe gear. Pain is relieved with periodic professional debridement.  Patient states his blood sugar was 117 mg/dL this morning.  PCP is Dr. Leighton Ruff.  He is also followed by Dr. Elayne Snare for his diabetes.  Last visit with Dr. Dwyane Dee was 01/02/2020.  He voices no new pedal concerns from today's visit.  Objective: There were no vitals filed for this visit.  [name] is a/an 87 y.o. Caucasian male  IN NAD. AAO X 3.  Capillary refill time to digits immediate b/l. Palpable DP pulses b/l. Palpable PT pulses b/l. Pedal hair present b/l. Skin temperature gradient within normal limits b/l.  Pedal skin is thin shiny, atrophic bilaterally. No open wounds bilaterally. No interdigital macerations bilaterally. Toenails 1-5 b/l elongated, dystrophic, thickened, crumbly with subungual debris and tenderness to dorsal palpation.  Normal muscle strength 5/5 to all lower extremity muscle groups bilaterally, no pain crepitus or joint limitation noted with ROM b/l and bunion deformity noted b/l  Protective sensation intact 5/5 intact bilaterally with 10g monofilament b/l Vibratory sensation intact b/l Proprioception intact bilaterally  Hemoglobin A1C Latest Ref Rng & Units 12/21/2019 08/21/2019 05/15/2019  HGBA1C 4.6 - 6.5 % 6.6(H) 6.4 6.3  Some recent data might be hidden   1. Pain due to onychomycosis of toenails of both feet   2. Hallux valgus, acquired, bilateral   3. Controlled type 2 diabetes mellitus without complication, without long-term current use of insulin ([place])     -Examined patient. -No new findings. No new orders. -Continue diabetic foot care principles. -Toenails 1-5 b/l were debrided in length and girth with sterile nail  nippers and dremel without iatrogenic bleeding.  -Patient to report any pedal injuries to medical professional immediately. -Patient to continue soft, supportive shoe gear daily. -Patient/POA to call should there be question/concern in the interim.  Return in about 3 months (around 06/10/2020) for diabetic foot care.

## 2020-04-02 ENCOUNTER — Other Ambulatory Visit: Payer: Self-pay

## 2020-04-02 ENCOUNTER — Ambulatory Visit: Payer: Medicare Other | Admitting: *Deleted

## 2020-04-02 DIAGNOSIS — Z5181 Encounter for therapeutic drug level monitoring: Secondary | ICD-10-CM

## 2020-04-02 DIAGNOSIS — I480 Paroxysmal atrial fibrillation: Secondary | ICD-10-CM

## 2020-04-02 LAB — POCT INR: INR: 2.4 (ref 2.0–3.0)

## 2020-04-02 NOTE — Patient Instructions (Signed)
Description   Continue taking Warfarin 1.5 tablets daily except for 1 tablet on Mondays and Fridays.  Recheck INR in 4 weeks. Anticoagulation Clinic [phone], Main Number [phone]     

## 2020-04-15 DIAGNOSIS — I10 Essential (primary) hypertension: Secondary | ICD-10-CM | POA: Diagnosis not present

## 2020-04-15 DIAGNOSIS — D6869 Other thrombophilia: Secondary | ICD-10-CM | POA: Diagnosis not present

## 2020-04-15 DIAGNOSIS — Z0000 Encounter for general adult medical examination without abnormal findings: Secondary | ICD-10-CM | POA: Diagnosis not present

## 2020-04-15 DIAGNOSIS — Z1389 Encounter for screening for other disorder: Secondary | ICD-10-CM | POA: Diagnosis not present

## 2020-04-30 ENCOUNTER — Ambulatory Visit: Payer: Medicare Other | Admitting: *Deleted

## 2020-04-30 ENCOUNTER — Other Ambulatory Visit: Payer: Self-pay

## 2020-04-30 DIAGNOSIS — I480 Paroxysmal atrial fibrillation: Secondary | ICD-10-CM

## 2020-04-30 DIAGNOSIS — Z5181 Encounter for therapeutic drug level monitoring: Secondary | ICD-10-CM | POA: Diagnosis not present

## 2020-04-30 LAB — POCT INR: INR: 4 — AB (ref 2.0–3.0)

## 2020-04-30 MED ORDER — WARFARIN SODIUM 5 MG PO TABS: ORAL_TABLET | ORAL | 1 refills | Status: DC

## 2020-04-30 NOTE — Patient Instructions (Signed)
Description   Do not take any Warfarin today then continue taking Warfarin 1.5 tablets daily except for 1 tablet on Mondays and Fridays.  Recheck INR in 3 weeks. Anticoagulation Clinic [phone], Main Number [phone]

## 2020-05-02 ENCOUNTER — Telehealth: Payer: Self-pay | Admitting: Endocrinology

## 2020-05-02 NOTE — Telephone Encounter (Signed)
Pt called because he is having really bad diarrhea whenever he takes his Metformin. Pt states he will stop taking it and be fine, but once he starts back taking it, the diarrhea comes back. Pt would like Dr to prescribe an alternative medication.    Please send prescription to  [facility] [place], [place] - [address] Phone:  [phone]  Fax:  [phone]

## 2020-05-03 ENCOUNTER — Ambulatory Visit: Payer: Medicare Other | Admitting: Podiatry

## 2020-05-03 NOTE — Telephone Encounter (Signed)
I would like him to monitor his sugars without any medications since other medications would be brand-name and will cost more.  He will let us know if he has blood sugars over 200

## 2020-05-03 NOTE — Telephone Encounter (Signed)
Please advise below message 

## 2020-05-04 NOTE — Telephone Encounter (Signed)
Pt stated--stop taking the metformin 3-4 days ago, which diarrhea stop. Also, pt stated Accu-chek test strip not cover by the insurance--only covered are lifestyle and [place] brand. Recommended pt can try the OTC test strips for accu-check and call the office back blood sugar over 200 per Dr. Dwyane Dee

## 2020-05-04 NOTE — Telephone Encounter (Signed)
We can call in a Bayer contour meter and prescription for test strips to check once a day.  I can see him back in April instead of waiting till May for his appointment

## 2020-05-06 ENCOUNTER — Other Ambulatory Visit: Payer: Self-pay | Admitting: *Deleted

## 2020-05-06 DIAGNOSIS — E1169 Type 2 diabetes mellitus with other specified complication: Secondary | ICD-10-CM

## 2020-05-06 DIAGNOSIS — E669 Obesity, unspecified: Secondary | ICD-10-CM

## 2020-05-06 MED ORDER — GLUCOSE BLOOD VI STRP: ORAL_STRIP | 12 refills | Status: DC

## 2020-05-06 MED ORDER — BAYER CONTOUR LINK 2.4 W/DEVICE KIT: PACK | 0 refills | Status: DC

## 2020-05-06 NOTE — Telephone Encounter (Signed)
Routed the message to the front staff to reshedule.

## 2020-05-06 NOTE — Telephone Encounter (Signed)
LVM--sent the Bayer contour meter and test strips with instructions. Please call the office back to reschedule appt in April instead May.

## 2020-05-21 ENCOUNTER — Ambulatory Visit: Payer: Medicare Other | Admitting: *Deleted

## 2020-05-21 ENCOUNTER — Other Ambulatory Visit: Payer: Self-pay

## 2020-05-21 DIAGNOSIS — I480 Paroxysmal atrial fibrillation: Secondary | ICD-10-CM

## 2020-05-21 DIAGNOSIS — Z5181 Encounter for therapeutic drug level monitoring: Secondary | ICD-10-CM

## 2020-05-21 LAB — POCT INR: INR: 4.6 — AB (ref 2.0–3.0)

## 2020-05-21 NOTE — Patient Instructions (Signed)
Description   Do not take any Warfarin today and No Warfarin tomoorrw then start taking Warfarin 1 tablet daily except for 1.5 tablets on Tuesdays, Thursdays, and Saturdays.  Recheck INR in 10 days. Anticoagulation Clinic [phone], Main Number [phone]

## 2020-05-23 DIAGNOSIS — Z87442 Personal history of urinary calculi: Secondary | ICD-10-CM | POA: Diagnosis not present

## 2020-05-23 DIAGNOSIS — N400 Enlarged prostate without lower urinary tract symptoms: Secondary | ICD-10-CM | POA: Diagnosis not present

## 2020-05-23 DIAGNOSIS — Z8551 Personal history of malignant neoplasm of bladder: Secondary | ICD-10-CM | POA: Diagnosis not present

## 2020-05-31 ENCOUNTER — Ambulatory Visit: Payer: Medicare Other

## 2020-05-31 ENCOUNTER — Other Ambulatory Visit: Payer: Self-pay

## 2020-05-31 DIAGNOSIS — Z5181 Encounter for therapeutic drug level monitoring: Secondary | ICD-10-CM | POA: Diagnosis not present

## 2020-05-31 DIAGNOSIS — I480 Paroxysmal atrial fibrillation: Secondary | ICD-10-CM | POA: Diagnosis not present

## 2020-05-31 LAB — POCT INR: INR: 2.5 (ref 2.0–3.0)

## 2020-05-31 NOTE — Patient Instructions (Signed)
-   continue  taking Warfarin 1 tablet daily except for 1.5 tablets on Tuesdays, Thursdays, and Saturdays.   - Recheck INR in 3 weeks.  Anticoagulation Clinic [phone], Main Number [phone]

## 2020-06-18 ENCOUNTER — Other Ambulatory Visit (INDEPENDENT_AMBULATORY_CARE_PROVIDER_SITE_OTHER): Payer: Medicare Other

## 2020-06-18 ENCOUNTER — Other Ambulatory Visit: Payer: Self-pay

## 2020-06-18 DIAGNOSIS — E041 Nontoxic single thyroid nodule: Secondary | ICD-10-CM | POA: Diagnosis not present

## 2020-06-18 DIAGNOSIS — E669 Obesity, unspecified: Secondary | ICD-10-CM

## 2020-06-18 DIAGNOSIS — E1169 Type 2 diabetes mellitus with other specified complication: Secondary | ICD-10-CM | POA: Diagnosis not present

## 2020-06-18 LAB — COMPREHENSIVE METABOLIC PANEL
ALT: 19 U/L (ref 0–53)
AST: 21 U/L (ref 0–37)
Albumin: 3.9 g/dL (ref 3.5–5.2)
Alkaline Phosphatase: 38 U/L — ABNORMAL LOW (ref 39–117)
BUN: 20 mg/dL (ref 6–23)
CO2: 28 mEq/L (ref 19–32)
Calcium: 9.6 mg/dL (ref 8.4–10.5)
Chloride: 104 mEq/L (ref 96–112)
Creatinine, Ser: 1.45 mg/dL (ref 0.40–1.50)
GFR: 43.27 mL/min — ABNORMAL LOW (ref >60.00)
Glucose, Bld: 155 mg/dL — ABNORMAL HIGH (ref 70–99)
Potassium: 4.1 mEq/L (ref 3.5–5.1)
Sodium: 140 mEq/L (ref 135–145)
Total Bilirubin: 1.6 mg/dL — ABNORMAL HIGH (ref 0.2–1.2)
Total Protein: 6.7 g/dL (ref 6.0–8.3)

## 2020-06-18 LAB — MICROALBUMIN / CREATININE URINE RATIO
Creatinine,U: 166.9 mg/dL
Microalb Creat Ratio: 1.4 mg/g (ref 0.0–30.0)
Microalb, Ur: 2.3 mg/dL — ABNORMAL HIGH (ref 0.0–1.9)

## 2020-06-18 LAB — TSH: TSH: 1.7 u[IU]/mL (ref 0.35–4.50)

## 2020-06-18 LAB — HEMOGLOBIN A1C: Hgb A1c MFr Bld: 7.3 % — ABNORMAL HIGH (ref 4.6–6.5)

## 2020-06-21 ENCOUNTER — Ambulatory Visit: Payer: Medicare Other | Admitting: Endocrinology

## 2020-06-21 ENCOUNTER — Other Ambulatory Visit: Payer: Self-pay

## 2020-06-21 ENCOUNTER — Encounter: Payer: Self-pay | Admitting: Endocrinology

## 2020-06-21 ENCOUNTER — Ambulatory Visit (INDEPENDENT_AMBULATORY_CARE_PROVIDER_SITE_OTHER): Payer: Medicare Other | Admitting: *Deleted

## 2020-06-21 VITALS — BP 124/74 | HR 46 | Ht 64.0 in | Wt 174.2 lb

## 2020-06-21 DIAGNOSIS — E1165 Type 2 diabetes mellitus with hyperglycemia: Secondary | ICD-10-CM | POA: Diagnosis not present

## 2020-06-21 DIAGNOSIS — Z5181 Encounter for therapeutic drug level monitoring: Secondary | ICD-10-CM

## 2020-06-21 DIAGNOSIS — I480 Paroxysmal atrial fibrillation: Secondary | ICD-10-CM

## 2020-06-21 LAB — POCT INR: INR: 2 (ref 2.0–3.0)

## 2020-06-21 NOTE — Patient Instructions (Signed)
Description   - continue  taking Warfarin 1 tablet daily except for 1.5 tablets on Tuesdays, Thursdays, and Saturdays.   - Recheck INR in 4 weeks.  Anticoagulation Clinic [phone], Main Number [phone]

## 2020-06-21 NOTE — Progress Notes (Signed)
Patient ID: Cody Thomas, male   DOB: 12-Jul-1930, 87 y.o.   MRN: [id number]   Reason for Appointment: Diabetes follow-up   History of Present Illness   Diagnosis: Type 2 DIABETES MELITUS  He has had mild diabetes which has been previously erratic partly because of dietary inconsistency and difficulty with weight loss He has not been continued on metformin because of renal dysfunction Was given low-dose Januvia instead but this was too expensive for him even though it was helping his control In 2014 he was given low dose Amaryl instead which appears to be keeping his blood sugars controlled  His blood sugars were not well controlled when he was seen in 1/16 with A1c 7.8  Subsequently Amaryl was stopped partly because of mild hypoglycemia  recent history:  Since 1/16 he has been on metformin ER and he is  taking 500 mg twice daily  A1c is higher at 7.3 compared to 6.6, previously was at 6.4  Previous range 6.2-8.0 . Current blood sugar patterns, problems and management:  He has not been taking his metformin for about 6 weeks because of increasing symptoms of diarrhea which he did not have before  However he appears to have lost weight since last year, previously had gained some weight  Again he is not very active usually  Diet is fairly good with only occasional sweets  Highest blood sugar at home is only 167 recently  His morning lab glucose was 155  Currently not on any pharmacological treatment for diabetes  Monitors blood glucose: Once a day or less .    Glucometer:   Accu-Chek          Blood Glucose readings and averages from download  Recent range 129-167, MEDIAN 149  AVERAGE in the morning 145  PREVIOUSLY:  PRE-MEAL Fasting Lunch Dinner Bedtime Overall  Glucose range:  90-173    135-179   Mean/median:  121     129   POST-MEAL PC Breakfast PC Lunch PC Dinner  Glucose range:   109, 129   Mean/median:        Dietitian consultation last  done several years ago  Breakfast meals: Sometimes Cheerios cereal, oatmeal, sometimes an egg, toast and apple butter May have cookies for snacks   Wt Readings from Last 3 Encounters:  06/21/20 174 lb 3.2 oz (79 kg)  01/17/20 181 lb 6.4 oz (82.3 kg)  01/04/20 183 lb 9.6 oz (83.3 kg)   DM LABS:  Lab Results  Component Value Date   HGBA1C 7.3 (H) 06/18/2020   HGBA1C 6.6 (H) 12/21/2019   HGBA1C 6.4 08/21/2019   Lab Results  Component Value Date   MICROALBUR 2.3 (H) 06/18/2020   LDLCALC 50 08/21/2019   CREATININE 1.45 06/18/2020   Lab on 06/18/2020  Component Date Value Ref Range Status  . TSH 06/18/2020 1.70  0.35 - 4.50 uIU/mL Final  . Microalb, Ur 06/18/2020 2.3* 0.0 - 1.9 mg/dL Final  . Creatinine,U 06/18/2020 166.9  mg/dL Final  . Microalb Creat Ratio 06/18/2020 1.4  0.0 - 30.0 mg/g Final  . Sodium 06/18/2020 140  135 - 145 mEq/L Final  . Potassium 06/18/2020 4.1  3.5 - 5.1 mEq/L Final  . Chloride 06/18/2020 104  96 - 112 mEq/L Final  . CO2 06/18/2020 28  19 - 32 mEq/L Final  . Glucose, Bld 06/18/2020 155* 70 - 99 mg/dL Final  . BUN 06/18/2020 20  6 - 23 mg/dL Final  . Creatinine, Ser 06/18/2020 1.45  0.40 - 1.50 mg/dL Final  . Total Bilirubin 06/18/2020 1.6* 0.2 - 1.2 mg/dL Final  . Alkaline Phosphatase 06/18/2020 38* 39 - 117 U/L Final  . AST 06/18/2020 21  0 - 37 U/L Final  . ALT 06/18/2020 19  0 - 53 U/L Final  . Total Protein 06/18/2020 6.7  6.0 - 8.3 g/dL Final  . Albumin 06/18/2020 3.9  3.5 - 5.2 g/dL Final  . GFR 06/18/2020 43.27* >60.00 mL/min Final   Calculated using the CKD-EPI Creatinine Equation (2021)  . Calcium 06/18/2020 9.6  8.4 - 10.5 mg/dL Final  . Hgb A1c MFr Bld 06/18/2020 7.3* 4.6 - 6.5 % Final   Glycemic Control Guidelines for People with Diabetes:Non Diabetic:  <6%Goal of Therapy: <7%Additional Action Suggested:  >8%      Allergies as of 06/21/2020      Reactions   Rosuvastatin    Nightmares in high dosage Other reaction(s): nightmares  when taken in LARGE  doses   Sertraline Diarrhea   Other reaction(s): diarrhea   Simvastatin    Nightmares in high dosage Other reaction(s): nightmares when taken in LARGE  doses      Medication List       Accurate as of June 21, 2020 10:10 AM. If you have any questions, ask your nurse or doctor.        Bayer Contour Link 2.4 w/Device Kit Check sugar once daily.   calcium carbonate 500 MG chewable tablet Commonly known as: TUMS - dosed in mg elemental calcium 2 tablets as needed for indegestion   Co Q 10 100 MG Caps 1 capsule with a meal   Coenzyme Q10-Vitamin E 100-1 MG-UNT/5ML Syrp Take 100 mg by mouth daily. Take 2 tablespoons (100MG) Daily   Fish Oil 1200 MG Cpdr 2 capsules   fluticasone 50 MCG/ACT nasal spray Commonly known as: FLONASE Place 1 spray into both nostrils daily as needed for allergies or rhinitis.   glucose blood test strip Use as instructed check sugar once daily.   LORazepam 0.5 MG tablet Commonly known as: ATIVAN Take 0.25-0.5 mg by mouth daily as needed for anxiety.   losartan-hydrochlorothiazide 100-25 MG tablet Commonly known as: HYZAAR 1 tablet   metoprolol tartrate 25 MG tablet Commonly known as: LOPRESSOR 1 tablet with food   rosuvastatin 20 MG tablet Commonly known as: CRESTOR Take 20 mg by mouth See admin instructions. Take 1 tablet by mouth three times weekly.   traMADol 50 MG tablet Commonly known as: ULTRAM 1 tablet as needed   traMADol 50 MG tablet Commonly known as: ULTRAM TAKE 1 TABLET BY MOUTH EVERY 6 HOURS AS NEEDED   Vitamin B Complex Tabs See admin instructions.   warfarin 5 MG tablet Commonly known as: COUMADIN Take as directed by the anticoagulation clinic. If you are unsure how to take this medication, talk to your nurse or doctor. Original instructions: 1 tablet on Mon and Friday, all other days take 1.5 tablets   Zinc 100 MG Tabs 1 tablet       Allergies:  Allergies  Allergen Reactions  .  Rosuvastatin     Nightmares in high dosage Other reaction(s): nightmares when taken in LARGE  doses  . Sertraline Diarrhea    Other reaction(s): diarrhea  . Simvastatin     Nightmares in high dosage Other reaction(s): nightmares when taken in LARGE  doses    Past Medical History:  Diagnosis Date  . Anxiety   . Bunion 09/29/2011  . Cancer ([place])  nose/Dr. Albertini;skin  . Chronic constipation   . Chronic kidney disease    stage 111  . Coronary atherosclerosis of native coronary artery    2009 LAD CIRC DES  . Depression    doesn't take any meds for this  . Diabetes mellitus    takes Januvia daily  . Dizziness   . Enlarged prostate    but doesn't require meds at present  . GERD (gastroesophageal reflux disease)    TUMS prn  . H/O hiatal hernia   . Headache(784.0)   . History of gout    doesn't require meds  . Hx of cardiac cath   . Hyperlipidemia    Crestor 3 x wk and Zetia daily  . Hypertension    takes Hyzaar daily  . Insomnia    doesn't require meds at present time  . Joint pain   . Onychomycosis 10/05/2012   x 10  . PONV (postoperative nausea and vomiting)    pt states extremely sick  . Shortness of breath    with exertion    Past Surgical History:  Procedure Laterality Date  . bladder cancer    . CARDIAC CATHETERIZATION  2013  . cataracts removed    . CHOLECYSTECTOMY    . COLONOSCOPY    . CORONARY ANGIOPLASTY     2 stents from 2009  . CORONARY ARTERY BYPASS GRAFT  10/29/2011   Procedure: CORONARY ARTERY BYPASS GRAFTING (CABG);  Surgeon: Ivin Poot, MD;  Location: [place];  Service: Open Heart Surgery;  Laterality: N/A;  . cyst removed from finger     right hand  . EYE SURGERY     bilateral cataracts  . HEMORRHOID SURGERY    . HERNIA REPAIR     x 2;inguinal  . KIDNEY STONE SURGERY    . LEFT HEART CATHETERIZATION WITH CORONARY ANGIOGRAM N/A 10/21/2011   Procedure: LEFT HEART CATHETERIZATION WITH CORONARY ANGIOGRAM;  Surgeon: Candee Furbish, MD;   Location: [facility] CATH LAB;  Service: Cardiovascular;  Laterality: N/A;  . rotator cuff surgery     right   . skin cancer removed      Family History  Problem Relation Age of Onset  . Heart disease Mother   . Stroke Father   . Cancer Brother     Social History:  reports that he quit smoking about 36 years ago. His smoking use included cigarettes. He has a 45.00 pack-year smoking history. He has never used smokeless tobacco. He reports that he does not drink alcohol and does not use drugs.  Review of Systems:  HYPERTENSION:  followed by PCP and cardiologist and blood pressure is recently controlled  He checks his blood pressure at home regularly also  BP Readings from Last 3 Encounters:  06/21/20 124/74  01/17/20 140/80  01/04/20 (!) 170/76    RENAL dysfunction: Has mild persistent increase in creatinine with some variability  Lab Results  Component Value Date   CREATININE 1.45 06/18/2020   CREATININE 1.44 12/21/2019   CREATININE 1.37 08/21/2019     HYPERLIPIDEMIA: The lipid abnormality consists of elevated LDL and low HDL treated with Crestor  Controlled taking Crestor 20 mg  Lipids followed by PCP and cardiologist  Last LDL 76 done by PCP in 2/22   Lab Results  Component Value Date   CHOL 126 08/21/2019   HDL 38.40 (L) 08/21/2019   LDLCALC 50 08/21/2019   TRIG 187.0 (H) 08/21/2019   CHOLHDL 3 08/21/2019    Thyroid nodule: This has  been stable and small with previous evaluations  Had a follow-up up  ultrasound in December 2017 done by his PCP, this was stable He also has a 12 mm probably pseudo-nodule present     Examination:   BP 124/74   Pulse (!) 46   Ht '5\' 4"'  (1.626 m)   Wt 174 lb 3.2 oz (79 kg)   SpO2 94%   BMI 29.90 kg/m   Body mass index is 29.9 kg/m.  .    ASSESSMENT/ PLAN:   Diabetes type 2 with mild obesity   See history of present illness for detailed discussion of current diabetes management, blood sugar patterns and problems  identified  A1c is relatively higher at 7.3  Currently not on metformin which she had taken long-term because of complaints of diarrhea even with taking the extended release recently Has lost weight likely from other reasons  Currently since his blood sugars are usually under 170 at home and A1c is reasonably good for his age we will continue to watch without medications   Renal function stable, no microalbuminuria   We will follow-up again in 3 months  There are no Patient Instructions on file for this visit.    Elayne Snare 06/21/2020, 10:10 AM

## 2020-06-28 DIAGNOSIS — Z792 Long term (current) use of antibiotics: Secondary | ICD-10-CM | POA: Diagnosis not present

## 2020-06-28 DIAGNOSIS — S51831A Puncture wound without foreign body of right forearm, initial encounter: Secondary | ICD-10-CM | POA: Diagnosis not present

## 2020-07-01 ENCOUNTER — Other Ambulatory Visit: Payer: Medicare Other

## 2020-07-01 ENCOUNTER — Ambulatory Visit: Payer: Medicare Other | Admitting: Podiatry

## 2020-07-01 ENCOUNTER — Other Ambulatory Visit: Payer: Self-pay

## 2020-07-01 DIAGNOSIS — M2011 Hallux valgus (acquired), right foot: Secondary | ICD-10-CM

## 2020-07-01 DIAGNOSIS — M79674 Pain in right toe(s): Secondary | ICD-10-CM | POA: Diagnosis not present

## 2020-07-01 DIAGNOSIS — M2012 Hallux valgus (acquired), left foot: Secondary | ICD-10-CM

## 2020-07-01 DIAGNOSIS — B351 Tinea unguium: Secondary | ICD-10-CM | POA: Diagnosis not present

## 2020-07-01 DIAGNOSIS — M79675 Pain in left toe(s): Secondary | ICD-10-CM

## 2020-07-01 DIAGNOSIS — E119 Type 2 diabetes mellitus without complications: Secondary | ICD-10-CM

## 2020-07-01 DIAGNOSIS — D6859 Other primary thrombophilia: Secondary | ICD-10-CM | POA: Insufficient documentation

## 2020-07-04 ENCOUNTER — Ambulatory Visit: Payer: Medicare Other | Admitting: Endocrinology

## 2020-07-04 ENCOUNTER — Encounter: Payer: Self-pay | Admitting: Podiatry

## 2020-07-04 NOTE — Progress Notes (Signed)
  Subjective:  Patient ID: Cody Thomas, male    DOB: 1931/01/10,  MRN: [id number]  87 y.o. male presents with preventative diabetic foot care and painful thick toenails that are difficult to trim. Pain interferes with ambulation. Aggravating factors include wearing enclosed shoe gear. Pain is relieved with periodic professional debridement.  Patient's blood sugar was 159 mg/dl one week ago.  Patient did not check blood glucose this morning.  PCP: Cari Caraway, MD and last visit was: February 2022.  He states his wife had a major stroke and is residing at [facility].  They are contemplating hospice services.  Review of Systems: Negative except as noted in the HPI.   Allergies  Allergen Reactions  . Rosuvastatin     Nightmares in high dosage Other reaction(s): nightmares when taken in LARGE  doses  . Sertraline Diarrhea    Other reaction(s): diarrhea  . Simvastatin     Nightmares in high dosage Other reaction(s): nightmares when taken in LARGE  doses    Objective:  There were no vitals filed for this visit. Constitutional Patient is a pleasant 87 y.o. Caucasian male in NAD. AAO x 3.  Vascular Capillary refill time to digits immediate b/l. Palpable pedal pulses b/l LE. Pedal hair present. Lower extremity skin temperature gradient within normal limits. No pain with calf compression b/l. No cyanosis or clubbing noted.  Neurologic Normal speech. Protective sensation intact 5/5 intact bilaterally with 10g monofilament b/l. Vibratory sensation intact b/l. Proprioception intact bilaterally.  Dermatologic Pedal skin is thin shiny, atrophic b/l lower extremities. No open wounds bilaterally. No interdigital macerations bilaterally. Toenails 1-5 b/l elongated, discolored, dystrophic, thickened, crumbly with subungual debris and tenderness to dorsal palpation.  Orthopedic: Normal muscle strength 5/5 to all lower extremity muscle groups bilaterally. No pain crepitus or joint limitation  noted with ROM b/l. Hallux valgus with bunion deformity noted b/l lower extremities.   Hemoglobin A1C Latest Ref Rng & Units 06/18/2020 12/21/2019 08/21/2019  HGBA1C 4.6 - 6.5 % 7.3(H) 6.6(H) 6.4  Some recent data might be hidden   Assessment:   1. Pain due to onychomycosis of toenails of both feet   2. Hallux valgus, acquired, bilateral   3. Controlled type 2 diabetes mellitus without complication, without long-term current use of insulin ([place])    Plan:  Patient was evaluated and treated and all questions answered.  Onychomycosis with pain -Nails palliatively debridement as below. -Educated on self-care  Procedure: Nail Debridement Rationale: Pain Type of Debridement: manual, sharp debridement. Instrumentation: Nail nipper, rotary burr. Number of Nails: 10  -Examined patient. -No new findings. No new orders. -Continue diabetic foot care principles. -Patient to continue soft, supportive shoe gear daily. -Toenails 1-5 b/l were debrided in length and girth with sterile nail nippers and dremel without iatrogenic bleeding.  -Patient to report any pedal injuries to medical professional immediately. -Patient/POA to call should there be question/concern in the interim.  Return in about 3 months (around 10/01/2020).  Marzetta Board, DPM

## 2020-07-09 DIAGNOSIS — F439 Reaction to severe stress, unspecified: Secondary | ICD-10-CM | POA: Diagnosis not present

## 2020-07-09 DIAGNOSIS — F419 Anxiety disorder, unspecified: Secondary | ICD-10-CM | POA: Diagnosis not present

## 2020-07-19 ENCOUNTER — Other Ambulatory Visit: Payer: Self-pay

## 2020-07-19 ENCOUNTER — Ambulatory Visit: Payer: Medicare Other

## 2020-07-19 DIAGNOSIS — I480 Paroxysmal atrial fibrillation: Secondary | ICD-10-CM | POA: Diagnosis not present

## 2020-07-19 DIAGNOSIS — Z7901 Long term (current) use of anticoagulants: Secondary | ICD-10-CM | POA: Diagnosis not present

## 2020-07-19 LAB — POCT INR: INR: 3.3 — AB (ref 2.0–3.0)

## 2020-07-19 NOTE — Patient Instructions (Signed)
Description   Skip today's dosage of Warfarin, then resume same dosage 1 tablet daily except for 1.5 tablets on Tuesdays, Thursdays, and Saturdays.  Recheck INR in 3 weeks.  Anticoagulation Clinic [phone], Main Number [phone]

## 2020-07-22 ENCOUNTER — Encounter: Payer: Self-pay | Admitting: Cardiology

## 2020-07-22 ENCOUNTER — Ambulatory Visit: Payer: Medicare Other | Admitting: Cardiology

## 2020-07-22 ENCOUNTER — Other Ambulatory Visit: Payer: Self-pay

## 2020-07-22 VITALS — BP 140/60 | HR 55 | Ht 64.0 in | Wt 172.0 lb

## 2020-07-22 DIAGNOSIS — I2510 Atherosclerotic heart disease of native coronary artery without angina pectoris: Secondary | ICD-10-CM | POA: Diagnosis not present

## 2020-07-22 DIAGNOSIS — I480 Paroxysmal atrial fibrillation: Secondary | ICD-10-CM | POA: Diagnosis not present

## 2020-07-22 NOTE — Patient Instructions (Signed)
Medication Instructions:  The current medical regimen is effective;  continue present plan and medications.  *If you need a refill on your cardiac medications before your next appointment, please call your pharmacy*  Follow-Up: At [facility], you and your health needs are our priority.  As part of our continuing mission to provide you with exceptional heart care, we have created designated Provider Care Teams.  These Care Teams include your primary Cardiologist (physician) and Advanced Practice Providers (APPs -  Physician Assistants and Nurse Practitioners) who all work together to provide you with the care you need, when you need it.  We recommend signing up for the patient portal called "MyChart".  Sign up information is provided on this After Visit Summary.  MyChart is used to connect with patients for Virtual Visits (Telemedicine).  Patients are able to view lab/test results, encounter notes, upcoming appointments, etc.  Non-urgent messages can be sent to your provider as well.   To learn more about what you can do with MyChart, go to [email or web address].    Your next appointment:   1 year(s)  The format for your next appointment:   In Person  Provider:   Mark Skains, MD   Thank you for choosing [facility]!!    

## 2020-07-22 NOTE — Progress Notes (Signed)
Cardiology Office Note:    Date:  07/22/2020   ID:  Cody Thomas, DOB [birth year], MRN [id number]  PCP:  Cari Caraway, MD   [facility] Providers Cardiologist:  Candee Furbish, MD     Referring MD: Cari Caraway, MD     History of Present Illness:    Cody Thomas is a 87 y.o. male here for the follow-up of coronary artery disease with prior CABG in 2013.  Has atrial fibrillation as well.  Unfortunately on April 13, 2020 his wife had a stroke in her sleep.  She is now at a facility, PEG tube.  Fairly drowsy most of the time.  Unable to mobilize.  He is very emotional about this obviously.  Previously had an episode of near syncope where he felt as though he was going to pass out while sitting in his chair.  An event monitor on 05/26/2019 showed multiple episodes of atrial flutter with variable conduction and multiple episodes of nonsustained ventricular tachycardia longest was 9 seconds.  He also had a pause of greater than 3 seconds.  This pause was during conversion of atrial flutter to normal sinus rhythm.  He then underwent an echocardiogram in 2021 that showed normal ejection fraction.  EP evaluated him.  Known pacemaker needs.  Had prior COVID infection with antibody treatment.    Past Medical History:  Diagnosis Date  . Anxiety   . Bunion 09/29/2011  . Cancer (HCC)    nose/Dr. Albertini;skin  . Chronic constipation   . Chronic kidney disease    stage 111  . Coronary atherosclerosis of native coronary artery    2009 LAD CIRC DES  . Depression    doesn't take any meds for this  . Diabetes mellitus    takes Januvia daily  . Dizziness   . Enlarged prostate    but doesn't require meds at present  . GERD (gastroesophageal reflux disease)    TUMS prn  . H/O hiatal hernia   . Headache(784.0)   . History of gout    doesn't require meds  . Hx of cardiac cath   . Hyperlipidemia    Crestor 3 x wk and Zetia daily  . Hypertension    takes Hyzaar daily   . Insomnia    doesn't require meds at present time  . Joint pain   . Onychomycosis 10/05/2012   x 10  . PONV (postoperative nausea and vomiting)    pt states extremely sick  . Shortness of breath    with exertion    Past Surgical History:  Procedure Laterality Date  . bladder cancer    . CARDIAC CATHETERIZATION  2013  . cataracts removed    . CHOLECYSTECTOMY    . COLONOSCOPY    . CORONARY ANGIOPLASTY     2 stents from 2009  . CORONARY ARTERY BYPASS GRAFT  10/29/2011   Procedure: CORONARY ARTERY BYPASS GRAFTING (CABG);  Surgeon: Ivin Poot, MD;  Location: [place];  Service: Open Heart Surgery;  Laterality: N/A;  . cyst removed from finger     right hand  . EYE SURGERY     bilateral cataracts  . HEMORRHOID SURGERY    . HERNIA REPAIR     x 2;inguinal  . KIDNEY STONE SURGERY    . LEFT HEART CATHETERIZATION WITH CORONARY ANGIOGRAM N/A 10/21/2011   Procedure: LEFT HEART CATHETERIZATION WITH CORONARY ANGIOGRAM;  Surgeon: Candee Furbish, MD;  Location: [facility] CATH LAB;  Service: Cardiovascular;  Laterality: N/A;  .  rotator cuff surgery     right   . skin cancer removed      Current Medications: Current Meds  Medication Sig  . B Complex Vitamins (VITAMIN B COMPLEX) TABS See admin instructions.  . Blood Glucose Monitoring Suppl (BAYER CONTOUR LINK 2.4) w/Device KIT Check sugar once daily.  . calcium carbonate (TUMS - DOSED IN MG ELEMENTAL CALCIUM) 500 MG chewable tablet 2 tablets as needed for indegestion  . Coenzyme Q10 (CO Q 10) 100 MG CAPS 1 capsule with a meal  . Coenzyme Q10-Vitamin E 100-1 MG-UNT/5ML SYRP Take 100 mg by mouth daily. Take 2 tablespoons (100MG) Daily  . fluticasone (FLONASE) 50 MCG/ACT nasal spray Place 1 spray into both nostrils daily as needed for allergies or rhinitis.  Marland Kitchen glucose blood test strip Use as instructed check sugar once daily.  Marland Kitchen LORazepam (ATIVAN) 0.5 MG tablet Take 0.25-0.5 mg by mouth daily as needed for anxiety.   Marland Kitchen losartan-hydrochlorothiazide  (HYZAAR) 100-25 MG tablet 1 tablet  . metoprolol tartrate (LOPRESSOR) 25 MG tablet 1 tablet with food  . Omega-3 Fatty Acids (FISH OIL) 1200 MG CPDR 2 capsules  . rosuvastatin (CRESTOR) 20 MG tablet Take 20 mg by mouth See admin instructions. Take 1 tablet by mouth three times weekly.  . traMADol (ULTRAM) 50 MG tablet TAKE 1 TABLET BY MOUTH EVERY 6 HOURS AS NEEDED  . traMADol (ULTRAM) 50 MG tablet 1 tablet as needed  . warfarin (COUMADIN) 5 MG tablet 1 tablet on Mon and Friday, all other days take 1.5 tablets  . Zinc 100 MG TABS 1 tablet     Allergies:   Rosuvastatin, Sertraline, and Simvastatin   Social History   Socioeconomic History  . Marital status: Married    Spouse name: Not on file  . Number of children: 2  . Years of education: HS  . Highest education level: Not on file  Occupational History  . Occupation: Retired  Tobacco Use  . Smoking status: Former Smoker    Packs/day: 1.50    Years: 30.00    Pack years: 45.00    Types: Cigarettes    Quit date: 03/02/1984    Years since quitting: 36.4  . Smokeless tobacco: Never Used  Vaping Use  . Vaping Use: Never used  Substance and Sexual Activity  . Alcohol use: No    Alcohol/week: 0.0 standard drinks  . Drug use: No  . Sexual activity: Not Currently  Other Topics Concern  . Not on file  Social History Narrative   Lives at home with his wife and great-granddaughter.   Right-handed.   Occasional caffeine use.   Social Determinants of Health   Financial Resource Strain: Not on file  Food Insecurity: Not on file  Transportation Needs: Not on file  Physical Activity: Not on file  Stress: Not on file  Social Connections: Not on file     Family History: The patient's family history includes Cancer in his brother; Heart disease in his mother; Stroke in his father.  ROS:   Please see the history of present illness.     All other systems reviewed and are negative.  EKGs/Labs/Other Studies Reviewed:      Recent  Labs: 06/18/2020: ALT 19; BUN 20; Creatinine, Ser 1.45; Potassium 4.1; Sodium 140; TSH 1.70  Recent Lipid Panel    Component Value Date/Time   CHOL 126 08/21/2019 0855   TRIG 187.0 (H) 08/21/2019 0855   HDL 38.40 (L) 08/21/2019 0855   CHOLHDL 3 08/21/2019 2202  VLDL 37.4 08/21/2019 0855   LDLCALC 50 08/21/2019 0855     Risk Assessment/Calculations:      Physical Exam:    VS:  BP 140/60 (BP Location: Left Arm, Patient Position: Sitting, Cuff Size: Normal)   Pulse (!) 55   Ht _0  (1.626 m)   Wt 172 lb (78 kg)   SpO2 90%   BMI 29.52 kg/m     Wt Readings from Last 3 Encounters:  07/22/20 172 lb (78 kg)  06/21/20 174 lb 3.2 oz (79 kg)  01/17/20 181 lb 6.4 oz (82.3 kg)     GEN:  Well nourished, well developed in no acute distress HEENT: Normal NECK: No JVD; No carotid bruits LYMPHATICS: No lymphadenopathy CARDIAC: RRR, no murmurs, rubs, gallops RESPIRATORY:  Clear to auscultation without rales, wheezing or rhonchi  ABDOMEN: Soft, non-tender, non-distended MUSCULOSKELETAL:  No edema; No deformity  SKIN: Warm and dry NEUROLOGIC:  Alert and oriented x 3 PSYCHIATRIC:  Normal affect   ASSESSMENT:    1. Paroxysmal atrial fibrillation (HCC)   2. CAD in native artery    PLAN:    In order of problems listed above:  Paroxysmal atrial fibrillation - On Coumadin, Eliquis was too expensive.  Well-controlled.  No changes made.  Continue to monitor here in the clinic medication management.  Near syncope - Appreciate EP evaluation.  He did not wish to have implantation of loop recorder.  No pacemaker needs.  No further episodes.  Coronary artery disease post CABG - Doing well without any anginal symptoms.  Hyperlipidemia - Continue Crestor 20 mg.  Last LDL 51.  Refills as needed for this medical management.  Challenging emotional times with his wife having had stroke. Fell at [facility] scraped knee. Able to ambulate.    Medication Adjustments/Labs and Tests  Ordered: Current medicines are reviewed at length with the patient today.  Concerns regarding medicines are outlined above.  No orders of the defined types were placed in this encounter.  No orders of the defined types were placed in this encounter.   Patient Instructions  Medication Instructions:  The current medical regimen is effective;  continue present plan and medications.  *If you need a refill on your cardiac medications before your next appointment, please call your pharmacy*  Follow-Up: At [facility], you and your health needs are our priority.  As part of our continuing mission to provide you with exceptional heart care, we have created designated Provider Care Teams.  These Care Teams include your primary Cardiologist (physician) and Advanced Practice Providers (APPs -  Physician Assistants and Nurse Practitioners) who all work together to provide you with the care you need, when you need it.  We recommend signing up for the patient portal called "MyChart".  Sign up information is provided on this After Visit Summary.  MyChart is used to connect with patients for Virtual Visits (Telemedicine).  Patients are able to view lab/test results, encounter notes, upcoming appointments, etc.  Non-urgent messages can be sent to your provider as well.   To learn more about what you can do with MyChart, go to [email or web address].    Your next appointment:   1 year(s)  The format for your next appointment:   In Person  Provider:   Candee Furbish, MD   Thank you for choosing [facility]!!        Signed, Candee Furbish, MD  07/22/2020 4:34 PM    [place]

## 2020-07-25 DIAGNOSIS — F439 Reaction to severe stress, unspecified: Secondary | ICD-10-CM | POA: Diagnosis not present

## 2020-07-25 DIAGNOSIS — M25511 Pain in right shoulder: Secondary | ICD-10-CM | POA: Diagnosis not present

## 2020-07-25 DIAGNOSIS — W19XXXA Unspecified fall, initial encounter: Secondary | ICD-10-CM | POA: Diagnosis not present

## 2020-07-25 DIAGNOSIS — F419 Anxiety disorder, unspecified: Secondary | ICD-10-CM | POA: Diagnosis not present

## 2020-08-09 ENCOUNTER — Ambulatory Visit: Payer: Medicare Other

## 2020-08-09 ENCOUNTER — Other Ambulatory Visit: Payer: Self-pay

## 2020-08-09 DIAGNOSIS — I480 Paroxysmal atrial fibrillation: Secondary | ICD-10-CM | POA: Diagnosis not present

## 2020-08-09 DIAGNOSIS — Z7901 Long term (current) use of anticoagulants: Secondary | ICD-10-CM

## 2020-08-09 LAB — POCT INR: INR: 2.4 (ref 2.0–3.0)

## 2020-08-09 MED ORDER — WARFARIN SODIUM 5 MG PO TABS: ORAL_TABLET | ORAL | 1 refills | Status: DC

## 2020-08-09 NOTE — Patient Instructions (Signed)
Description   Continue on same dosage 1 tablet daily except for 1.5 tablets on Tuesdays, Thursdays, and Saturdays.  Recheck INR in 4 weeks.  Anticoagulation Clinic [phone], Main Number [phone]

## 2020-09-05 DIAGNOSIS — J310 Chronic rhinitis: Secondary | ICD-10-CM | POA: Diagnosis not present

## 2020-09-05 DIAGNOSIS — I10 Essential (primary) hypertension: Secondary | ICD-10-CM | POA: Diagnosis not present

## 2020-09-05 DIAGNOSIS — L570 Actinic keratosis: Secondary | ICD-10-CM | POA: Diagnosis not present

## 2020-09-05 DIAGNOSIS — F439 Reaction to severe stress, unspecified: Secondary | ICD-10-CM | POA: Diagnosis not present

## 2020-09-06 ENCOUNTER — Ambulatory Visit: Payer: Medicare Other | Admitting: Pharmacist

## 2020-09-06 ENCOUNTER — Other Ambulatory Visit: Payer: Self-pay

## 2020-09-06 DIAGNOSIS — Z7901 Long term (current) use of anticoagulants: Secondary | ICD-10-CM

## 2020-09-06 DIAGNOSIS — I480 Paroxysmal atrial fibrillation: Secondary | ICD-10-CM

## 2020-09-06 LAB — POCT INR: INR: 3.2 — AB (ref 2.0–3.0)

## 2020-09-06 NOTE — Patient Instructions (Signed)
Description   Take 1/2 tablet today, then continue on same dosage 1 tablet daily except for 1.5 tablets on Tuesdays, Thursdays, and Saturdays.  Recheck INR in 4 weeks.  Anticoagulation Clinic [phone], Main Number [phone]

## 2020-09-20 ENCOUNTER — Other Ambulatory Visit: Payer: Self-pay

## 2020-09-20 ENCOUNTER — Encounter: Payer: Self-pay | Admitting: Podiatry

## 2020-09-20 ENCOUNTER — Ambulatory Visit (INDEPENDENT_AMBULATORY_CARE_PROVIDER_SITE_OTHER): Payer: Medicare Other | Admitting: Podiatry

## 2020-09-20 DIAGNOSIS — B351 Tinea unguium: Secondary | ICD-10-CM | POA: Diagnosis not present

## 2020-09-20 DIAGNOSIS — M79674 Pain in right toe(s): Secondary | ICD-10-CM | POA: Diagnosis not present

## 2020-09-20 DIAGNOSIS — M79675 Pain in left toe(s): Secondary | ICD-10-CM

## 2020-09-22 NOTE — Progress Notes (Signed)
Subjective: Cody Thomas is a pleasant 87 y.o. male patient seen today painful thick toenails that are difficult to trim. Pain interferes with ambulation. Aggravating factors include wearing enclosed shoe gear. Pain is relieved with periodic professional debridement.  PCP is Cari Caraway, MD. Last visit was: two week ago.  Allergies  Allergen Reactions   Rosuvastatin     Nightmares in high dosage Other reaction(s): nightmares when taken in LARGE  doses   Sertraline Diarrhea    Other reaction(s): diarrhea   Simvastatin     Nightmares in high dosage Other reaction(s): nightmares when taken in LARGE  doses    Objective: Physical Exam  General: Hubbard E Kina is a pleasant 87 y.o. Caucasian male, WD, WN in NAD. AAO x 3.   Vascular:  Capillary refill time to digits immediate b/l. Palpable DP pulse(s) b/l lower extremities Palpable PT pulse(s) b/l lower extremities Pedal hair present. Lower extremity skin temperature gradient within normal limits. No pain with calf compression b/l.  Dermatological:  Pedal skin is thin shiny, atrophic b/l lower extremities. No open wounds b/l lower extremities. No interdigital macerations b/l lower extremities. Toenails 1-5 b/l elongated, discolored, dystrophic, thickened, crumbly with subungual debris and tenderness to dorsal palpation.  Musculoskeletal:  Normal muscle strength 5/5 to all lower extremity muscle groups bilaterally. No pain crepitus or joint limitation noted with ROM b/l. Hallux valgus with bunion deformity noted b/l lower extremities.  Neurological:  Protective sensation intact 5/5 intact bilaterally with 10g monofilament b/l. Vibratory sensation intact b/l.  Assessment and Plan:  1. Pain due to onychomycosis of toenails of both feet    -Examined patient. -Patient to continue soft, supportive shoe gear daily. -Toenails 1-5 b/l were debrided in length and girth with sterile nail nippers and dremel without iatrogenic  bleeding.  -Patient to report any pedal injuries to medical professional immediately. -Patient/POA to call should there be question/concern in the interim.  Return in about 3 months (around 12/21/2020).  Marzetta Board, DPM

## 2020-09-24 ENCOUNTER — Other Ambulatory Visit: Payer: Self-pay

## 2020-09-24 ENCOUNTER — Other Ambulatory Visit (INDEPENDENT_AMBULATORY_CARE_PROVIDER_SITE_OTHER): Payer: Medicare Other

## 2020-09-24 DIAGNOSIS — E1165 Type 2 diabetes mellitus with hyperglycemia: Secondary | ICD-10-CM

## 2020-09-24 LAB — COMPREHENSIVE METABOLIC PANEL
ALT: 20 U/L (ref 0–53)
AST: 21 U/L (ref 0–37)
Albumin: 4 g/dL (ref 3.5–5.2)
Alkaline Phosphatase: 35 U/L — ABNORMAL LOW (ref 39–117)
BUN: 22 mg/dL (ref 6–23)
CO2: 28 mEq/L (ref 19–32)
Calcium: 9.6 mg/dL (ref 8.4–10.5)
Chloride: 102 mEq/L (ref 96–112)
Creatinine, Ser: 1.56 mg/dL — ABNORMAL HIGH (ref 0.40–1.50)
GFR: 39.56 mL/min — ABNORMAL LOW (ref >60.00)
Glucose, Bld: 121 mg/dL — ABNORMAL HIGH (ref 70–99)
Potassium: 3.7 mEq/L (ref 3.5–5.1)
Sodium: 138 mEq/L (ref 135–145)
Total Bilirubin: 2.3 mg/dL — ABNORMAL HIGH (ref 0.2–1.2)
Total Protein: 6.7 g/dL (ref 6.0–8.3)

## 2020-09-24 LAB — HEMOGLOBIN A1C: Hgb A1c MFr Bld: 7.3 % — ABNORMAL HIGH (ref 4.6–6.5)

## 2020-09-27 ENCOUNTER — Ambulatory Visit: Payer: Medicare Other | Admitting: Endocrinology

## 2020-10-02 ENCOUNTER — Other Ambulatory Visit: Payer: Self-pay

## 2020-10-02 ENCOUNTER — Ambulatory Visit: Payer: Medicare Other | Admitting: Endocrinology

## 2020-10-02 ENCOUNTER — Encounter: Payer: Self-pay | Admitting: Endocrinology

## 2020-10-02 VITALS — BP 130/68 | HR 54 | Ht 64.0 in | Wt 172.6 lb

## 2020-10-02 DIAGNOSIS — E1165 Type 2 diabetes mellitus with hyperglycemia: Secondary | ICD-10-CM | POA: Diagnosis not present

## 2020-10-02 DIAGNOSIS — Z0000 Encounter for general adult medical examination without abnormal findings: Secondary | ICD-10-CM | POA: Diagnosis not present

## 2020-10-02 DIAGNOSIS — E7800 Pure hypercholesterolemia, unspecified: Secondary | ICD-10-CM | POA: Diagnosis not present

## 2020-10-02 DIAGNOSIS — I10 Essential (primary) hypertension: Secondary | ICD-10-CM | POA: Diagnosis not present

## 2020-10-02 DIAGNOSIS — D6869 Other thrombophilia: Secondary | ICD-10-CM | POA: Diagnosis not present

## 2020-10-02 DIAGNOSIS — Z1389 Encounter for screening for other disorder: Secondary | ICD-10-CM | POA: Diagnosis not present

## 2020-10-02 MED ORDER — SITAGLIPTIN PHOSPHATE 100 MG PO TABS: 100.0000 mg | ORAL_TABLET | Freq: Every day | ORAL | 2 refills | Status: DC

## 2020-10-02 NOTE — Patient Instructions (Addendum)
Check blood sugars on waking up 1-2 days a week  Also check blood sugars about 2 hours after meals and do this after different meals by rotation  Recommended blood sugar levels on waking up are 90-130 and about 2 hours after meal is 130-180  Please bring your blood sugar monitor to each visit, thank you  SEE IF TEST STRIPS out of date  Take only HALF  a pill of new Rx Januvia

## 2020-10-02 NOTE — Progress Notes (Signed)
Patient ID: Cody Thomas, male   DOB: 06-01-1930, 87 y.o.   MRN: [id number]   Reason for Appointment: Diabetes follow-up   History of Present Illness   Diagnosis: Type 2 DIABETES MELITUS  He has had mild diabetes which has been previously erratic partly because of dietary inconsistency and difficulty with weight loss He has not been continued on metformin because of renal dysfunction Was given low-dose Januvia instead but this was too expensive for him even though it was helping his control In 2014 he was given low dose Amaryl instead which appears to be keeping his blood sugars controlled  His blood sugars were not well controlled when he was seen in 1/16 with A1c 7.8  Subsequently Amaryl was stopped partly because of mild hypoglycemia  RECENT history:  Since 1/16 he had been on metformin ER which was recently stopped  A1c is the same at 7.3 compared to 6.6 previously  Previous range 6.2-8.0 . Current blood sugar patterns, problems and management: He has not been taking his metformin since about 3/22 because of diarrhea  He has had blood sugars as high as 294  Has only 3 blood sugar readings in the last month at home, lowest reading 177 However he is not consistently following any diet and probably getting more carbohydrates like pancakes at times and some sweets  His weight is about the same  However lab fasting glucose was 121 Not able to do any significant exercise  Monitors blood glucose: Once a day or less .    Glucometer:   Accu-Chek          Blood Glucose readings recent range 177-294, mostly postprandial  Previous range 129-167, MEDIAN 149   Dietitian consultation last done several years ago  Breakfast meals: Sometimes Cheerios cereal, oatmeal, sometimes an egg, toast and apple butter May have cookies for snacks   Wt Readings from Last 3 Encounters:  10/02/20 172 lb 9.6 oz (78.3 kg)  07/22/20 172 lb (78 kg)  06/21/20 174 lb 3.2 oz (79 kg)    DM LABS:  Lab Results  Component Value Date   HGBA1C 7.3 (H) 09/24/2020   HGBA1C 7.3 (H) 06/18/2020   HGBA1C 6.6 (H) 12/21/2019   Lab Results  Component Value Date   MICROALBUR 2.3 (H) 06/18/2020   LDLCALC 50 08/21/2019   CREATININE 1.56 (H) 09/24/2020   No visits with results within 1 Week(s) from this visit.  Latest known visit with results is:  Lab on 09/24/2020  Component Date Value Ref Range Status   Sodium 09/24/2020 138  135 - 145 mEq/L Final   Potassium 09/24/2020 3.7  3.5 - 5.1 mEq/L Final   Chloride 09/24/2020 102  96 - 112 mEq/L Final   CO2 09/24/2020 28  19 - 32 mEq/L Final   Glucose, Bld 09/24/2020 121 (A) 70 - 99 mg/dL Final   BUN 09/24/2020 22  6 - 23 mg/dL Final   Creatinine, Ser 09/24/2020 1.56 (A) 0.40 - 1.50 mg/dL Final   Total Bilirubin 09/24/2020 2.3 (A) 0.2 - 1.2 mg/dL Final   Alkaline Phosphatase 09/24/2020 35 (A) 39 - 117 U/L Final   AST 09/24/2020 21  0 - 37 U/L Final   ALT 09/24/2020 20  0 - 53 U/L Final   Total Protein 09/24/2020 6.7  6.0 - 8.3 g/dL Final   Albumin 09/24/2020 4.0  3.5 - 5.2 g/dL Final   GFR 09/24/2020 39.56 (A) >60.00 mL/min Final   Calculated using the CKD-EPI Creatinine  Equation (2021)   Calcium 09/24/2020 9.6  8.4 - 10.5 mg/dL Final   Hgb A1c MFr Bld 09/24/2020 7.3 (A) 4.6 - 6.5 % Final   Glycemic Control Guidelines for People with Diabetes:Non Diabetic:  <6%Goal of Therapy: <7%Additional Action Suggested:  >8%      Allergies as of 10/02/2020       Reactions   Rosuvastatin    Nightmares in high dosage Other reaction(s): nightmares when taken in LARGE  doses   Sertraline Diarrhea   Other reaction(s): diarrhea   Simvastatin    Nightmares in high dosage Other reaction(s): nightmares when taken in LARGE  doses        Medication List        Accurate as of October 02, 2020 11:59 PM. If you have any questions, ask your nurse or doctor.          Bayer Contour Link 2.4 w/Device Kit Check sugar once daily.    calcium carbonate 500 MG chewable tablet Commonly known as: TUMS - dosed in mg elemental calcium 2 tablets as needed for indegestion   Co Q 10 100 MG Caps 1 capsule with a meal   Coenzyme Q10-Vitamin E 100-1 MG-UNT/5ML Syrp Take 100 mg by mouth daily. Take 2 tablespoons (100MG) Daily   Fish Oil 1200 MG Cpdr 2 capsules   fluticasone 50 MCG/ACT nasal spray Commonly known as: FLONASE Place 1 spray into both nostrils daily as needed for allergies or rhinitis.   glucose blood test strip Use as instructed check sugar once daily.   LORazepam 0.5 MG tablet Commonly known as: ATIVAN Take 0.25-0.5 mg by mouth daily as needed for anxiety.   losartan-hydrochlorothiazide 100-25 MG tablet Commonly known as: HYZAAR 1 tablet   metoprolol tartrate 25 MG tablet Commonly known as: LOPRESSOR 1 tablet with food   rosuvastatin 20 MG tablet Commonly known as: CRESTOR Take 20 mg by mouth See admin instructions. Take 1 tablet by mouth three times weekly.   sitaGLIPtin 100 MG tablet Commonly known as: Januvia Take 1 tablet (100 mg total) by mouth daily. Started by: Elayne Snare, MD   traMADol 50 MG tablet Commonly known as: ULTRAM 1 tablet as needed   traMADol 50 MG tablet Commonly known as: ULTRAM TAKE 1 TABLET BY MOUTH EVERY 6 HOURS AS NEEDED   traZODone 50 MG tablet Commonly known as: DESYREL Take 50 mg by mouth at bedtime as needed.   Vitamin B Complex Tabs See admin instructions.   warfarin 5 MG tablet Commonly known as: COUMADIN Take as directed by the anticoagulation clinic. If you are unsure how to take this medication, talk to your nurse or doctor. Original instructions: 1 tablet on Mon and Friday, all other days take 1.5 tablets   Zinc 100 MG Tabs 1 tablet        Allergies:  Allergies  Allergen Reactions   Rosuvastatin     Nightmares in high dosage Other reaction(s): nightmares when taken in LARGE  doses   Sertraline Diarrhea    Other reaction(s): diarrhea    Simvastatin     Nightmares in high dosage Other reaction(s): nightmares when taken in LARGE  doses    Past Medical History:  Diagnosis Date   Anxiety    Bunion 09/29/2011   Cancer (HCC)    nose/Dr. Albertini;skin   Chronic constipation    Chronic kidney disease    stage 111   Coronary atherosclerosis of native coronary artery    2009 LAD CIRC DES  Depression    doesn't take any meds for this   Diabetes mellitus    takes Januvia daily   Dizziness    Enlarged prostate    but doesn't require meds at present   GERD (gastroesophageal reflux disease)    TUMS prn   H/O hiatal hernia    Headache(784.0)    History of gout    doesn't require meds   Hx of cardiac cath    Hyperlipidemia    Crestor 3 x wk and Zetia daily   Hypertension    takes Hyzaar daily   Insomnia    doesn't require meds at present time   Joint pain    Onychomycosis 10/05/2012   x 10   PONV (postoperative nausea and vomiting)    pt states extremely sick   Shortness of breath    with exertion    Past Surgical History:  Procedure Laterality Date   bladder cancer     CARDIAC CATHETERIZATION  2013   cataracts removed     CHOLECYSTECTOMY     COLONOSCOPY     CORONARY ANGIOPLASTY     2 stents from 2009   [place]  10/29/2011   Procedure: CORONARY ARTERY BYPASS GRAFTING (CABG);  Surgeon: Ivin Poot, MD;  Location: [place];  Service: Open Heart Surgery;  Laterality: N/A;   cyst removed from finger     right hand   EYE SURGERY     bilateral cataracts   HEMORRHOID SURGERY     HERNIA REPAIR     x 2;inguinal   KIDNEY STONE SURGERY     LEFT HEART CATHETERIZATION WITH CORONARY ANGIOGRAM N/A 10/21/2011   Procedure: LEFT HEART CATHETERIZATION WITH CORONARY ANGIOGRAM;  Surgeon: Candee Furbish, MD;  Location: [facility] CATH LAB;  Service: Cardiovascular;  Laterality: N/A;   rotator cuff surgery     right    skin cancer removed      Family History  Problem Relation Age of Onset   Heart  disease Mother    Stroke Father    Cancer Brother     Social History:  reports that he quit smoking about 36 years ago. His smoking use included cigarettes. He has a 45.00 pack-year smoking history. He has never used smokeless tobacco. He reports that he does not drink alcohol and does not use drugs.  Review of Systems:  HYPERTENSION:  followed by PCP and cardiologist and blood pressure is recently controlled  He checks his blood pressure at home regularly but not remembering the exact readings He thinks blood pressure readings are usually 161-096 systolic  Although blood pressure was low on first measurement it was about normal on second attempt by myself  BP Readings from Last 3 Encounters:  10/02/20 130/68  07/22/20 140/60  06/21/20 124/74    RENAL dysfunction: Has mild persistent increase in creatinine with some variability  Lab Results  Component Value Date   CREATININE 1.56 (H) 09/24/2020   CREATININE 1.45 06/18/2020   CREATININE 1.44 12/21/2019     HYPERLIPIDEMIA: The lipid abnormality consists of elevated LDL and low HDL treated with Crestor  Controlled taking Crestor 20 mg  Lipids followed by PCP and cardiologist  Last LDL 76 done by PCP in 2/22   Lab Results  Component Value Date   CHOL 126 08/21/2019   HDL 38.40 (L) 08/21/2019   LDLCALC 50 08/21/2019   TRIG 187.0 (H) 08/21/2019   CHOLHDL 3 08/21/2019    Thyroid nodule: This has been stable  and small with previous evaluations  Had a follow-up up  ultrasound in December 2017 done by his PCP, this was stable He also has a 12 mm probably pseudo-nodule present  He has regular foot exams with the podiatrist     Examination:   BP 130/68 (Patient Position: Standing)   Pulse (!) 54   Ht _0  (1.626 m)   Wt 172 lb 9.6 oz (78.3 kg)   SpO2 96%   BMI 29.63 kg/m   Body mass index is 29.63 kg/m.  .    ASSESSMENT/ PLAN:   Diabetes type 2 with mild obesity   See history of present illness for  detailed discussion of current diabetes management, blood sugar patterns and problems identified  A1c is 7.3 although not clear if this will be effected by renal dysfunction and possible anemia   He is not on any pharmacological treatment but has blood sugars as high as 294, appears to be getting higher since last visit  Weight has leveled off Discussed trying to do a little better with cutting back on carbohydrates and sweets Also needs to check blood sugars more consistently at various times Also not clear if his test trips at home are out of date and he will check  For simplicity and ease of use we will have him try Januvia 100 mg, half tablet daily although ideally he is a candidate for Jardiance or Farxiga, he is usually not wanting brand-name medications   Renal function shows mild increase in creatinine compared to previous readings If creatinine continues to increase may consider reducing dose of losartan and adjusting his combination  Follow-up in 3 months  Patient Instructions  Check blood sugars on waking up 1-2 days a week  Also check blood sugars about 2 hours after meals and do this after different meals by rotation  Recommended blood sugar levels on waking up are 90-130 and about 2 hours after meal is 130-180  Please bring your blood sugar monitor to each visit, thank you  SEE IF TEST STRIPS out of date  Take only HALF  a pill of new Rx Januvia    Elayne Snare 10/03/2020, 10:25 AM

## 2020-10-04 ENCOUNTER — Other Ambulatory Visit: Payer: Self-pay

## 2020-10-04 ENCOUNTER — Ambulatory Visit: Payer: Medicare Other

## 2020-10-04 DIAGNOSIS — Z5181 Encounter for therapeutic drug level monitoring: Secondary | ICD-10-CM | POA: Diagnosis not present

## 2020-10-04 DIAGNOSIS — I480 Paroxysmal atrial fibrillation: Secondary | ICD-10-CM | POA: Diagnosis not present

## 2020-10-04 LAB — POCT INR: INR: 2.6 (ref 2.0–3.0)

## 2020-10-04 NOTE — Patient Instructions (Signed)
Description   Continue on same dosage 1 tablet daily except for 1.5 tablets on Tuesdays, Thursdays, and Saturdays.  Recheck INR in 5 weeks.  Anticoagulation Clinic [phone], Main Number [phone]

## 2020-10-07 ENCOUNTER — Ambulatory Visit: Payer: Medicare Other | Admitting: Podiatry

## 2020-10-10 ENCOUNTER — Telehealth: Payer: Self-pay | Admitting: Pharmacy Technician

## 2020-10-10 ENCOUNTER — Telehealth: Payer: Self-pay | Admitting: Endocrinology

## 2020-10-10 DIAGNOSIS — F439 Reaction to severe stress, unspecified: Secondary | ICD-10-CM | POA: Diagnosis not present

## 2020-10-10 DIAGNOSIS — I2510 Atherosclerotic heart disease of native coronary artery without angina pectoris: Secondary | ICD-10-CM | POA: Diagnosis not present

## 2020-10-10 DIAGNOSIS — G479 Sleep disorder, unspecified: Secondary | ICD-10-CM | POA: Diagnosis not present

## 2020-10-10 DIAGNOSIS — I480 Paroxysmal atrial fibrillation: Secondary | ICD-10-CM | POA: Diagnosis not present

## 2020-10-10 NOTE — Telephone Encounter (Signed)
Pt calling in stating that this medication is 340 dollars after insurance, pt states that he can not afford it and is wondering what the alternative  would be.pt would like a call back.   sitaGLIPtin (JANUVIA) 100 MG tablet   [id number] - [place], [place] - [address]

## 2020-10-10 NOTE — Telephone Encounter (Addendum)
Patient Advocate Encounter   Received notification from PATIENT that cost of JANUVIA is too high.   Tier exception request submitted on 10/10/2020 Key [id number] Status is DENIED    [facility] will continue to follow   Ronney Asters, CPhT Patient Advocate [facility] Phone: [phone] Fax:  [phone]

## 2020-10-14 ENCOUNTER — Other Ambulatory Visit (HOSPITAL_COMMUNITY): Payer: Self-pay

## 2020-10-16 ENCOUNTER — Telehealth: Payer: Self-pay | Admitting: Endocrinology

## 2020-10-16 DIAGNOSIS — E1165 Type 2 diabetes mellitus with hyperglycemia: Secondary | ICD-10-CM

## 2020-10-16 MED ORDER — REPAGLINIDE 0.5 MG PO TABS: ORAL_TABLET | ORAL | 3 refills | Status: DC

## 2020-10-16 NOTE — Telephone Encounter (Signed)
Called patient and informed him of change and Rx was sent in.

## 2020-10-16 NOTE — Telephone Encounter (Signed)
Since he cannot afford Januvia he will need to start repaglinide 0.5 mg before breakfast and dinner.  He can skip the dose if he is eating a very light meal or no starchy foods.  Also needs to start checking blood sugars at least every other day at different times.  He will call if he has any unusual high or low blood sugars

## 2020-11-07 ENCOUNTER — Ambulatory Visit: Payer: Medicare Other

## 2020-11-07 ENCOUNTER — Other Ambulatory Visit: Payer: Self-pay

## 2020-11-07 DIAGNOSIS — I480 Paroxysmal atrial fibrillation: Secondary | ICD-10-CM | POA: Diagnosis not present

## 2020-11-07 DIAGNOSIS — Z7901 Long term (current) use of anticoagulants: Secondary | ICD-10-CM | POA: Diagnosis not present

## 2020-11-07 LAB — POCT INR: INR: 3 (ref 2.0–3.0)

## 2020-11-07 NOTE — Patient Instructions (Signed)
Description   Take 1 tablet today, then resume same dosage 1 tablet daily except for 1.5 tablets on Tuesdays, Thursdays, and Saturdays.  Recheck INR in 5 weeks.  [facility] [phone], Main Number [phone]

## 2020-11-18 ENCOUNTER — Other Ambulatory Visit: Payer: Self-pay | Admitting: Cardiology

## 2020-11-28 ENCOUNTER — Other Ambulatory Visit: Payer: Self-pay

## 2020-11-28 ENCOUNTER — Other Ambulatory Visit: Payer: Self-pay | Admitting: Cardiology

## 2020-11-28 ENCOUNTER — Other Ambulatory Visit (INDEPENDENT_AMBULATORY_CARE_PROVIDER_SITE_OTHER): Payer: Medicare Other

## 2020-11-28 DIAGNOSIS — E1165 Type 2 diabetes mellitus with hyperglycemia: Secondary | ICD-10-CM | POA: Diagnosis not present

## 2020-11-28 LAB — BASIC METABOLIC PANEL
BUN: 17 mg/dL (ref 6–23)
CO2: 28 mEq/L (ref 19–32)
Calcium: 9.5 mg/dL (ref 8.4–10.5)
Chloride: 101 mEq/L (ref 96–112)
Creatinine, Ser: 1.4 mg/dL (ref 0.40–1.50)
GFR: 44.99 mL/min — ABNORMAL LOW (ref >60.00)
Glucose, Bld: 133 mg/dL — ABNORMAL HIGH (ref 70–99)
Potassium: 3.6 mEq/L (ref 3.5–5.1)
Sodium: 138 mEq/L (ref 135–145)

## 2020-11-29 LAB — FRUCTOSAMINE: Fructosamine: 267 umol/L (ref 0–285)

## 2020-12-04 ENCOUNTER — Ambulatory Visit: Payer: Medicare Other | Admitting: Endocrinology

## 2020-12-12 ENCOUNTER — Other Ambulatory Visit: Payer: Self-pay

## 2020-12-12 ENCOUNTER — Ambulatory Visit: Payer: Medicare Other | Admitting: *Deleted

## 2020-12-12 DIAGNOSIS — I480 Paroxysmal atrial fibrillation: Secondary | ICD-10-CM | POA: Diagnosis not present

## 2020-12-12 DIAGNOSIS — Z5181 Encounter for therapeutic drug level monitoring: Secondary | ICD-10-CM

## 2020-12-12 LAB — POCT INR: INR: 2.2 (ref 2.0–3.0)

## 2020-12-12 NOTE — Patient Instructions (Signed)
Description   Continue same dosage 1 tablet daily except for 1.5 tablets on Tuesdays, Thursdays, and Saturdays.  Recheck INR in 6 weeks.  [facility] Clinic [phone], Main Number [phone]

## 2020-12-25 ENCOUNTER — Other Ambulatory Visit: Payer: Self-pay

## 2020-12-25 ENCOUNTER — Ambulatory Visit: Payer: Medicare Other | Admitting: Podiatry

## 2020-12-25 DIAGNOSIS — M79674 Pain in right toe(s): Secondary | ICD-10-CM | POA: Diagnosis not present

## 2020-12-25 DIAGNOSIS — B351 Tinea unguium: Secondary | ICD-10-CM

## 2020-12-25 DIAGNOSIS — M79675 Pain in left toe(s): Secondary | ICD-10-CM

## 2020-12-25 DIAGNOSIS — E119 Type 2 diabetes mellitus without complications: Secondary | ICD-10-CM

## 2020-12-30 ENCOUNTER — Encounter: Payer: Self-pay | Admitting: Podiatry

## 2020-12-30 NOTE — Progress Notes (Signed)
  Subjective:  Patient ID: Cody Thomas, male    DOB: 06/08/1930,  MRN: [id number]  87 y.o. male presents preventative diabetic foot care and thick, elongated toenails both feet which are tender when wearing enclosed shoe gear. Pain is relieved with periodic professional debridement.  He states his blood sugar was 126 mg/dl today.   He states he lost his wife on October 5th.  Cody Thomas voices no new pedal problems on today's visit.  PCP is Cari Caraway, MD , and last visit was July, 2022.  Allergies  Allergen Reactions   Rosuvastatin     Nightmares in high dosage Other reaction(s): nightmares when taken in LARGE  doses   Sertraline Diarrhea    Other reaction(s): diarrhea   Simvastatin     Nightmares in high dosage Other reaction(s): nightmares when taken in LARGE  doses    Review of Systems: Negative except as noted in the HPI.   Objective:  Vascular Examination: CFT immediate b/l LE. Palpable DP/PT pulses b/l LE. Digital hair present b/l. Skin temperature gradient WNL b/l. No pain with calf compression b/l. No edema noted b/l. Pedal hair present. Lower extremity skin temperature gradient within normal limits.  Neurological Examination: Protective sensation intact 5/5 intact bilaterally with 10g monofilament b/l. Vibratory sensation intact b/l.  Dermatological Examination: Pedal skin thin, shiny and atrophic b/l LE. No open wounds b/l LE. No interdigital macerations noted b/l LE. Toenails 1-5 left and 1-5 right elongated, discolored, dystrophic, thickened, and crumbly with subungual debris and tenderness to dorsal palpation.  Musculoskeletal Examination: Normal muscle strength 5/5 to all lower extremity muscle groups bilaterally. HAV with bunion deformity noted b/l LE.   Radiographs: None Assessment:   1. Pain due to onychomycosis of toenails of both feet   2. Controlled type 2 diabetes mellitus without complication, without long-term current use of insulin ([place])     Plan:  -Extended my condolences to Cody Thomas regarding the loss of his wife. -No new findings. No new orders. -Continue diabetic foot care principles: inspect feet daily, monitor glucose as recommended by PCP and/or Endocrinologist, and follow prescribed diet per PCP, Endocrinologist and/or dietician. -Toenails 1-5 b/l were debrided in length and girth with sterile nail nippers and dremel without iatrogenic bleeding.  -Patient/POA to call should there be question/concern in the interim.  Return in about 3 months (around 03/27/2021).  Marzetta Board, DPM

## 2021-02-03 ENCOUNTER — Other Ambulatory Visit: Payer: Self-pay

## 2021-02-03 ENCOUNTER — Ambulatory Visit: Payer: Medicare Other

## 2021-02-03 DIAGNOSIS — Z5181 Encounter for therapeutic drug level monitoring: Secondary | ICD-10-CM

## 2021-02-03 DIAGNOSIS — Z7901 Long term (current) use of anticoagulants: Secondary | ICD-10-CM

## 2021-02-03 DIAGNOSIS — I480 Paroxysmal atrial fibrillation: Secondary | ICD-10-CM | POA: Diagnosis not present

## 2021-02-03 LAB — POCT INR: INR: 2.5 (ref 2.0–3.0)

## 2021-02-03 NOTE — Patient Instructions (Signed)
Continue same dosage 1 tablet daily except for 1.5 tablets on Tuesdays, Thursdays, and Saturdays.  Recheck INR in 6 weeks.  Anticoagulation Clinic [phone], Main Number [phone]

## 2021-02-26 DIAGNOSIS — J069 Acute upper respiratory infection, unspecified: Secondary | ICD-10-CM | POA: Diagnosis not present

## 2021-02-26 DIAGNOSIS — R0989 Other specified symptoms and signs involving the circulatory and respiratory systems: Secondary | ICD-10-CM | POA: Diagnosis not present

## 2021-02-26 DIAGNOSIS — R051 Acute cough: Secondary | ICD-10-CM | POA: Diagnosis not present

## 2021-02-26 DIAGNOSIS — Z03818 Encounter for observation for suspected exposure to other biological agents ruled out: Secondary | ICD-10-CM | POA: Diagnosis not present

## 2021-03-04 DIAGNOSIS — J329 Chronic sinusitis, unspecified: Secondary | ICD-10-CM | POA: Diagnosis not present

## 2021-03-04 DIAGNOSIS — J40 Bronchitis, not specified as acute or chronic: Secondary | ICD-10-CM | POA: Diagnosis not present

## 2021-03-06 ENCOUNTER — Other Ambulatory Visit: Payer: Self-pay | Admitting: *Deleted

## 2021-03-06 MED ORDER — WARFARIN SODIUM 5 MG PO TABS: ORAL_TABLET | ORAL | 1 refills | Status: DC

## 2021-03-17 ENCOUNTER — Other Ambulatory Visit: Payer: Self-pay

## 2021-03-17 ENCOUNTER — Ambulatory Visit: Payer: Medicare Other

## 2021-03-17 DIAGNOSIS — I480 Paroxysmal atrial fibrillation: Secondary | ICD-10-CM | POA: Diagnosis not present

## 2021-03-17 DIAGNOSIS — Z7901 Long term (current) use of anticoagulants: Secondary | ICD-10-CM

## 2021-03-17 DIAGNOSIS — Z5181 Encounter for therapeutic drug level monitoring: Secondary | ICD-10-CM

## 2021-03-17 LAB — POCT INR: INR: 2.5 (ref 2.0–3.0)

## 2021-03-17 NOTE — Patient Instructions (Signed)
Continue same dosage 1 tablet daily except for 1.5 tablets on Tuesdays, Thursdays, and Saturdays.  Recheck INR in 6 weeks.  Anticoagulation Clinic [phone], Main Number [phone]

## 2021-04-02 ENCOUNTER — Encounter: Payer: Self-pay | Admitting: Podiatry

## 2021-04-02 ENCOUNTER — Other Ambulatory Visit: Payer: Self-pay

## 2021-04-02 ENCOUNTER — Ambulatory Visit: Payer: Medicare Other | Admitting: Podiatry

## 2021-04-02 DIAGNOSIS — M79674 Pain in right toe(s): Secondary | ICD-10-CM | POA: Diagnosis not present

## 2021-04-02 DIAGNOSIS — M79675 Pain in left toe(s): Secondary | ICD-10-CM | POA: Diagnosis not present

## 2021-04-02 DIAGNOSIS — F4320 Adjustment disorder, unspecified: Secondary | ICD-10-CM | POA: Insufficient documentation

## 2021-04-02 DIAGNOSIS — E1121 Type 2 diabetes mellitus with diabetic nephropathy: Secondary | ICD-10-CM | POA: Diagnosis not present

## 2021-04-02 DIAGNOSIS — M2011 Hallux valgus (acquired), right foot: Secondary | ICD-10-CM | POA: Diagnosis not present

## 2021-04-02 DIAGNOSIS — B351 Tinea unguium: Secondary | ICD-10-CM

## 2021-04-02 DIAGNOSIS — E119 Type 2 diabetes mellitus without complications: Secondary | ICD-10-CM | POA: Diagnosis not present

## 2021-04-02 DIAGNOSIS — J309 Allergic rhinitis, unspecified: Secondary | ICD-10-CM | POA: Insufficient documentation

## 2021-04-02 DIAGNOSIS — G479 Sleep disorder, unspecified: Secondary | ICD-10-CM | POA: Insufficient documentation

## 2021-04-02 DIAGNOSIS — F3289 Other specified depressive episodes: Secondary | ICD-10-CM | POA: Insufficient documentation

## 2021-04-02 DIAGNOSIS — B9689 Other specified bacterial agents as the cause of diseases classified elsewhere: Secondary | ICD-10-CM | POA: Insufficient documentation

## 2021-04-02 DIAGNOSIS — M2012 Hallux valgus (acquired), left foot: Secondary | ICD-10-CM

## 2021-04-08 NOTE — Progress Notes (Signed)
ANNUAL DIABETIC FOOT EXAM  Subjective: Cody Thomas presents today for for annual diabetic foot examination.  He states holidays were difficult as he is still mourning the loss of his wife who passed in October.  Patient relates 20 year h/o diabetes.  Patient denies any h/o foot wounds.  Patient endorses symptoms of foot tingling.  Patient denies any numbness, burning, or pins/needle sensation in feet.  Patient did not check blood glucose this morning.  Risk factors: diabetes, CKD, hyperlipidemia, HTN.  Cari Caraway, MD is patient's PCP. Last visit was 10/10/2020.  Past Medical History:  Diagnosis Date   Anxiety    Bunion 09/29/2011   Cancer ([place])    nose/Dr. Albertini;skin   Chronic constipation    Chronic kidney disease    stage 111   Coronary atherosclerosis of native coronary artery    2009 LAD CIRC DES   Depression    doesn't take any meds for this   Diabetes mellitus    takes Januvia daily   Dizziness    Enlarged prostate    but doesn't require meds at present   GERD (gastroesophageal reflux disease)    TUMS prn   H/O hiatal hernia    Headache(784.0)    History of gout    doesn't require meds   Hx of cardiac cath    Hyperlipidemia    Crestor 3 x wk and Zetia daily   Hypertension    takes Hyzaar daily   Insomnia    doesn't require meds at present time   Joint pain    Onychomycosis 10/05/2012   x 10   PONV (postoperative nausea and vomiting)    pt states extremely sick   Shortness of breath    with exertion   Patient Active Problem List   Diagnosis Date Noted   Adjustment disorder 04/02/2021   Bacterial sinusitis 04/02/2021   Allergic rhinitis 04/02/2021   Other specified depressive episodes 04/02/2021   Sleep disturbance 04/02/2021   Hypercoagulable state ([place]) 07/01/2020   Anxiety 03/12/2020   Chronic obstructive pulmonary disease ([place]) 03/12/2020   Diabetic renal disease ([place]) 03/12/2020   Gastroesophageal reflux disease 03/12/2020    History of atrial fibrillation 03/12/2020   History of squamous cell carcinoma 03/12/2020   Idiopathic sleep related nonobstructive alveolar hypoventilation 03/12/2020   Personal history of COVID-19 03/12/2020   Presence of aortocoronary bypass graft 03/12/2020   Recurrent major depression ([place]) 03/12/2020   Sinus bradycardia 08/01/2019   NSVT (nonsustained ventricular tachycardia) 08/01/2019   Long term (current) use of anticoagulants 04/24/2019   Near syncope 03/19/2019   PAF (paroxysmal atrial fibrillation) ([place]) 03/19/2019   Atrial fibrillation ([place]) 03/18/2019   OSA (obstructive sleep apnea) 12/14/2016   Obese 12/14/2016   Degenerative disc disease, lumbar 02/13/2016   Chronic left-sided low back pain with left-sided sciatica 02/13/2016   Left thyroid nodule 01/21/2015   Pulmonary nodules/lesions, multiple 10/30/2014   CKD (chronic kidney disease) stage 3, GFR 30-59 ml/min 09/21/2014   Arcus senilis of both eyes 09/06/2014   Ectropion of right eye 09/06/2014   Bladder cancer ([place]) 08/09/2014   Recurrent nephrolithiasis 04/23/2014   Benign nodular prostatic hyperplasia with lower urinary tract symptoms 10/30/2013   Type 2 diabetes mellitus without complication, without long-term current use of insulin ([place]) 10/20/2012   Onychomycosis 10/05/2012   Chest pain, unspecified 10/21/2011   Hypertension    Hyperlipidemia    Hx of cardiac cath    Coronary atherosclerosis of native coronary artery    Family history  of malignant neoplasm of prostate 10/19/2011   Past Surgical History:  Procedure Laterality Date   bladder cancer     CARDIAC CATHETERIZATION  2013   cataracts removed     CHOLECYSTECTOMY     COLONOSCOPY     CORONARY ANGIOPLASTY     2 stents from 2009   [place]  10/29/2011   Procedure: CORONARY ARTERY BYPASS GRAFTING (CABG);  Surgeon: Ivin Poot, MD;  Location: [facility];  Service: Open Heart Surgery;  Laterality: N/A;   cyst removed from  finger     right hand   EYE SURGERY     bilateral cataracts   HEMORRHOID SURGERY     HERNIA REPAIR     x 2;inguinal   KIDNEY STONE SURGERY     LEFT HEART CATHETERIZATION WITH CORONARY ANGIOGRAM N/A 10/21/2011   Procedure: LEFT HEART CATHETERIZATION WITH CORONARY ANGIOGRAM;  Surgeon: Candee Furbish, MD;  Location[facility];  Service: Cardiovascular;  Laterality: N/A;   rotator cuff surgery     right    skin cancer removed     Current Outpatient Medications on File Prior to Visit  Medication Sig Dispense Refill   B Complex Vitamins (VITAMIN B COMPLEX) TABS See admin instructions.     Blood Glucose Monitoring Suppl (BAYER CONTOUR LINK 2.4) w/Device KIT Check sugar once daily. 1 kit 0   calcium carbonate (TUMS - DOSED IN MG ELEMENTAL CALCIUM) 500 MG chewable tablet 2 tablets as needed for indegestion     Coenzyme Q10 (CO Q 10) 100 MG CAPS 1 capsule with a meal     Coenzyme Q10-Vitamin E 100-1 MG-UNT/5ML SYRP Take 100 mg by mouth daily. Take 2 tablespoons (100MG) Daily     fluticasone (FLONASE) 50 MCG/ACT nasal spray Place 1 spray into both nostrils daily as needed for allergies or rhinitis.     glucose blood test strip Use as instructed check sugar once daily. 100 each 12   LORazepam (ATIVAN) 0.5 MG tablet Take 0.25-0.5 mg by mouth daily as needed for anxiety.   0   losartan-hydrochlorothiazide (HYZAAR) 100-25 MG tablet TAKE 1 TABLET BY MOUTH DAILY 90 tablet 3   metoprolol tartrate (LOPRESSOR) 25 MG tablet TAKE 1 TABLET BY MOUTH TWICE DAILY 180 tablet 2   Omega-3 Fatty Acids (FISH OIL) 1200 MG CPDR 2 capsules     repaglinide (PRANDIN) 0.5 MG tablet Take tablet before breakfast and dinner 60 tablet 3   rosuvastatin (CRESTOR) 20 MG tablet Take 20 mg by mouth See admin instructions. Take 1 tablet by mouth three times weekly.     sitaGLIPtin (JANUVIA) 100 MG tablet Take 1 tablet (100 mg total) by mouth daily. 30 tablet 2   traMADol (ULTRAM) 50 MG tablet TAKE 1 TABLET BY MOUTH EVERY 6 HOURS AS  NEEDED (Patient not taking: Reported on 10/02/2020) 30 tablet 0   traMADol (ULTRAM) 50 MG tablet 1 tablet as needed (Patient not taking: Reported on 10/02/2020)     traZODone (DESYREL) 50 MG tablet Take 50 mg by mouth at bedtime as needed.     warfarin (COUMADIN) 5 MG tablet Take 1.5 tablets by mouth on Tuesday, Thursday, Saturday  and all other days take 1 tablet or as directed by Anticoagulation Clinic. 120 tablet 1   Zinc 100 MG TABS 1 tablet     No current facility-administered medications on file prior to visit.    Allergies  Allergen Reactions   Rosuvastatin     Nightmares in high dosage  Other reaction(s): nightmares when taken in LARGE  doses Other reaction(s): nightmares when taken in LARGE  doses   Sertraline Diarrhea    Other reaction(s): diarrhea   Simvastatin     Nightmares in high dosage Other reaction(s): nightmares when taken in LARGE  doses Other reaction(s): nightmares when taken in LARGE  doses   Social History   Occupational History   Occupation: Retired  Tobacco Use   Smoking status: Former    Packs/day: 1.50    Years: 30.00    Pack years: 45.00    Types: Cigarettes    Quit date: 03/02/1984    Years since quitting: 37.1   Smokeless tobacco: Never  Vaping Use   Vaping Use: Never used  Substance and Sexual Activity   Alcohol use: No    Alcohol/week: 0.0 standard drinks   Drug use: No   Sexual activity: Not Currently   Family History  Problem Relation Age of Onset   Heart disease Mother    Stroke Father    Cancer Brother    Immunization History  Administered Date(s) Administered   Influenza Split 11/09/2008, 11/20/2013   Influenza, High Dose Seasonal PF 11/09/2016, 11/15/2018, 12/15/2019   Influenza,inj,Quad PF,6+ Mos 01/19/2013, 01/01/2016, 11/29/2017   Influenza,inj,quad, With Preservative 12/21/2014   Influenza-Unspecified 12/19/2013, 12/05/2019   PFIZER(Purple Top)SARS-COV-2 Vaccination 06/08/2019, 07/06/2019, 02/19/2020   Pneumococcal  Conjugate-13 10/29/2012, 03/30/2013   Pneumococcal Polysaccharide-23 02/14/1998, 10/29/2009   Td 03/19/2008     Review of Systems: Negative except as noted in the HPI.   Objective: There were no vitals filed for this visit.  Cody Thomas is a pleasant 88 y.o. male in NAD. AAO X 3.  Vascular Examination: CFT immediate b/l LE. Faintly palpable DP/PT pulses b/l LE. Digital hair sparse b/l. Skin temperature gradient WNL b/l. No pain with calf compression b/l. No edema noted b/l. No cyanosis or clubbing noted b/l LE.  Dermatological Examination: Pedal skin thin, shiny and atrophic b/l LE.  No open wounds b/l. No interdigital macerations b/l. Toenails 1-5 b/l elongated, thickened, discolored with subungual debris. +Tenderness with dorsal palpation of nailplates. No hyperkeratotic or porokeratotic lesions present.  Musculoskeletal Examination: Normal muscle strength 5/5 to all lower extremity muscle groups bilaterally. HAV with bunion deformity noted b/l LE.Marland Kitchen No pain, crepitus or joint limitation noted with ROM b/l LE.  Patient ambulates independently without assistive aids.  Footwear Assessment: Does the patient wear appropriate shoes? Yes. Does the patient need inserts/orthotics? No.  Neurological Examination: Protective sensation intact 5/5 intact bilaterally with 10g monofilament b/l. Vibratory sensation intact b/l.  Hemoglobin A1C Latest Ref Rng & Units 09/24/2020 06/18/2020  HGBA1C 4.6 - 6.5 % 7.3(H) 7.3(H)  Some recent data might be hidden   Assessment: 1. Pain due to onychomycosis of toenails of both feet   2. Hallux valgus, acquired, bilateral   3. Diabetic nephropathy associated with type 2 diabetes mellitus ([place])   4. Encounter for diabetic foot exam ([place])      ADA Risk Categorization: Low Risk :  Patient has all of the following: Intact protective sensation No prior foot ulcer  No severe deformity Pedal pulses present  Plan: -Diabetic foot examination  performed today. -Continue foot and shoe inspections daily. Monitor blood glucose per PCP/Endocrinologist's recommendations. -Mycotic toenails 1-5 bilaterally were debrided in length and girth with sterile nail nippers and dremel without incident. -Patient/POA to call should there be question/concern in the interim.  Return in about 3 months (around 06/30/2021) for diabetic nail trim.  Stephani Police  Elisha Ponder, DPM

## 2021-04-11 ENCOUNTER — Telehealth: Payer: Self-pay

## 2021-04-11 NOTE — Telephone Encounter (Signed)
Left message for pt to call back to r/s appt on 2/27 due to staff meeting on that day.

## 2021-04-12 ENCOUNTER — Other Ambulatory Visit: Payer: Self-pay

## 2021-04-12 ENCOUNTER — Encounter (HOSPITAL_COMMUNITY): Payer: Self-pay | Admitting: Emergency Medicine

## 2021-04-12 ENCOUNTER — Emergency Department (HOSPITAL_COMMUNITY)
Admission: EM | Admit: 2021-04-12 | Discharge: 2021-04-12 | Disposition: A | Discharge status: Home / Self Care | Payer: Medicare Other | Attending: Emergency Medicine | Admitting: Emergency Medicine

## 2021-04-12 DIAGNOSIS — R0981 Nasal congestion: Secondary | ICD-10-CM | POA: Diagnosis not present

## 2021-04-12 DIAGNOSIS — S0592XA Unspecified injury of left eye and orbit, initial encounter: Secondary | ICD-10-CM | POA: Diagnosis present

## 2021-04-12 DIAGNOSIS — S0012XA Contusion of left eyelid and periocular area, initial encounter: Secondary | ICD-10-CM | POA: Diagnosis not present

## 2021-04-12 DIAGNOSIS — Z7901 Long term (current) use of anticoagulants: Secondary | ICD-10-CM | POA: Diagnosis not present

## 2021-04-12 DIAGNOSIS — H1131 Conjunctival hemorrhage, right eye: Secondary | ICD-10-CM | POA: Diagnosis not present

## 2021-04-12 DIAGNOSIS — H1132 Conjunctival hemorrhage, left eye: Secondary | ICD-10-CM | POA: Diagnosis not present

## 2021-04-12 DIAGNOSIS — X58XXXA Exposure to other specified factors, initial encounter: Secondary | ICD-10-CM | POA: Diagnosis not present

## 2021-04-12 DIAGNOSIS — I480 Paroxysmal atrial fibrillation: Secondary | ICD-10-CM | POA: Insufficient documentation

## 2021-04-12 LAB — PROTIME-INR
INR: 3.3 — ABNORMAL HIGH (ref 0.8–1.2)
Prothrombin Time: 33.1 seconds — ABNORMAL HIGH (ref 11.4–15.2)

## 2021-04-12 NOTE — ED Notes (Signed)
Visual Acuity: L eye: 10/12.5 R eye: 10/20  Results given to Dr Sabra Heck

## 2021-04-12 NOTE — Discharge Instructions (Addendum)
Please do not take your next dose of Coumadin, follow-up with your doctor for recheck of your INR which was 3.3 tonight, you should be seen within 1 week for that.  I spoke with the eye specialist, they want you to follow-up in the office over the week, do not rub your eye anymore, if this does start to bleed out it could be quite severe for you.  If you do have severe uncontrolled bleeding return to the emergency department immediately  Please make sure that you are using Zyrtec or Claritin every day to help prevent itching of the eyes, avoid itching your eyes at all costs

## 2021-04-12 NOTE — ED Provider Notes (Addendum)
[facility] EMERGENCY DEPARTMENT Provider Note   CSN: [id number] Arrival date & time: 04/12/21  1524     History  No chief complaint on file.   Cody Thomas is a 88 y.o. male.  HPI  This patient is a very pleasant 88 year old male, he is currently on warfarin therapy for his paroxysmal atrial fibrillation.  He reports that over the last few weeks he has been having some runny nose as well as itching his eyes frequently, he woke up this morning used his eyedrops and when he looked in the mirror he noticed that his left eye was completely bloodshot and swollen.  He did a virtual visit and was redirected to the ER.  Home Medications Prior to Admission medications   Medication Sig Start Date End Date Taking? Authorizing Provider  B Complex Vitamins (VITAMIN B COMPLEX) TABS See admin instructions.    [provider]  Blood Glucose Monitoring Suppl (BAYER CONTOUR LINK 2.4) w/Device KIT Check sugar once daily. 05/06/20   Elayne Snare, MD  calcium carbonate (TUMS - DOSED IN MG ELEMENTAL CALCIUM) 500 MG chewable tablet 2 tablets as needed for indegestion    [provider]  Coenzyme Q10 (CO Q 10) 100 MG CAPS 1 capsule with a meal    [provider]  Coenzyme Q10-Vitamin E 100-1 MG-UNT/5ML SYRP Take 100 mg by mouth daily. Take 2 tablespoons (100MG) Daily    [provider]  fluticasone (FLONASE) 50 MCG/ACT nasal spray Place 1 spray into both nostrils daily as needed for allergies or rhinitis.    [provider]  glucose blood test strip Use as instructed check sugar once daily. 05/06/20   Elayne Snare, MD  LORazepam (ATIVAN) 0.5 MG tablet Take 0.25-0.5 mg by mouth daily as needed for anxiety.  01/30/16   [provider]  losartan-hydrochlorothiazide (HYZAAR) 100-25 MG tablet TAKE 1 TABLET BY MOUTH DAILY 11/28/20   Jerline Pain, MD  metoprolol tartrate (LOPRESSOR) 25 MG tablet TAKE 1 TABLET BY MOUTH TWICE DAILY 11/18/20    Jerline Pain, MD  Omega-3 Fatty Acids (FISH OIL) 1200 MG CPDR 2 capsules    [provider]  repaglinide (PRANDIN) 0.5 MG tablet Take tablet before breakfast and dinner 10/16/20   Elayne Snare, MD  rosuvastatin (CRESTOR) 20 MG tablet Take 20 mg by mouth See admin instructions. Take 1 tablet by mouth three times weekly.    [provider]  sitaGLIPtin (JANUVIA) 100 MG tablet Take 1 tablet (100 mg total) by mouth daily. 10/02/20   Elayne Snare, MD  traMADol (ULTRAM) 50 MG tablet TAKE 1 TABLET BY MOUTH EVERY 6 HOURS AS NEEDED Patient not taking: Reported on 10/02/2020 09/01/17   Jessy Oto, MD  traMADol Veatrice Bourbon) 50 MG tablet 1 tablet as needed Patient not taking: Reported on 10/02/2020    [provider]  traZODone (DESYREL) 50 MG tablet Take 50 mg by mouth at bedtime as needed. 08/22/20   [provider]  warfarin (COUMADIN) 5 MG tablet Take 1.5 tablets by mouth on Tuesday, Thursday, Saturday  and all other days take 1 tablet or as directed by Anticoagulation Clinic. 03/06/21   Jerline Pain, MD  Zinc 100 MG TABS 1 tablet    [provider]      Allergies    Rosuvastatin, Sertraline, and Simvastatin    Review of Systems   Review of Systems  All other systems reviewed and are negative.  Physical Exam Updated Vital Signs  BP (!) 142/87    Pulse (!) 45    Resp 16    SpO2 95%  Physical Exam Vitals and nursing note reviewed.  Constitutional:      General: He is not in acute distress.    Appearance: He is well-developed.  HENT:     Head: Normocephalic and atraumatic.     Mouth/Throat:     Pharynx: No oropharyngeal exudate.  Eyes:     General: No scleral icterus.       Right eye: No discharge.        Left eye: No discharge.     Conjunctiva/sclera: Conjunctivae normal.     Pupils: Pupils are equal, round, and reactive to light.     Comments: There is some bruising on the left upper nasal portion of the eyelid, the left eyeball has diffuse conjunctival  hemorrhage and swelling, corneas are spared, there is no hyphema, pupils are normal, no tenderness over the globe  Neck:     Thyroid: No thyromegaly.     Vascular: No JVD.  Cardiovascular:     Rate and Rhythm: Normal rate and regular rhythm.     Heart sounds: Normal heart sounds. No murmur heard.   No friction rub. No gallop.  Pulmonary:     Effort: Pulmonary effort is normal. No respiratory distress.     Breath sounds: Normal breath sounds. No wheezing or rales.  Abdominal:     General: Bowel sounds are normal. There is no distension.     Palpations: Abdomen is soft. There is no mass.     Tenderness: There is no abdominal tenderness.  Musculoskeletal:        General: No tenderness. Normal range of motion.     Cervical back: Normal range of motion and neck supple.  Lymphadenopathy:     Cervical: No cervical adenopathy.  Skin:    General: Skin is warm and dry.     Findings: No erythema or rash.  Neurological:     Mental Status: He is alert.     Coordination: Coordination normal.  Psychiatric:        Behavior: Behavior normal.       ED Results / Procedures / Treatments   Labs (all labs ordered are listed, but only abnormal results are displayed) Labs Reviewed  PROTIME-INR - Abnormal; Notable for the following components:      Result Value   Prothrombin Time 33.1 (*)    INR 3.3 (*)    All other components within normal limits    EKG None  Radiology No results found.  Procedures Procedures    Medications Ordered in ED Medications - No data to display  ED Course/ Medical Decision Making/ A&P                           Medical Decision Making Amount and/or Complexity of Data Reviewed Labs: ordered.    There has been no global injury, this appears to be related to subconjunctival hemorrhage likely from rubbing his eyes.  Check INR, discussed with ophthalmology Dr. Lucianne Lei , they will see in the clinic but there is nothing to acutely do at this time, they  recommend not stopping Coumadin at this time.  INR is 3.3.  Pt denies trauma, no bony ttp of the eye - anterior chamber is clear - no hyphema and normal pupil, normal visual acutiy for patient  Patient informed of result,  Ophthalmology made recommendations, patient needs to  stop rubbing his eyes, he is agreeable, we will have him hold his next dose of warfarin    Final Clinical Impression(s) / ED Diagnoses Final diagnoses:  Subconjunctival bleed, left     Noemi Chapel, MD 04/12/21 1801    Noemi Chapel, MD 04/12/21 [phone]

## 2021-04-12 NOTE — ED Triage Notes (Signed)
C/o L eye hemorrhage since this morning.  States his vision is unchanged.

## 2021-04-12 NOTE — ED Provider Triage Note (Signed)
Emergency Medicine Provider Triage Evaluation Note  PRAISE DOLECKI , a 88 y.o. male  was evaluated in triage.  Pt complains of left eye hemorrhage. Patient woke up this morning with subconjunctival hemorrhage. No recent vomiting. Denies any eye trauma. Patient seen by PCP prior to arrival and sent to the ED for further evaluation. Denies visual changes. Patient is on Warfarin.   Review of Systems  Positive: Eye pain Negative: Visual changes  Physical Exam  BP (!) 170/85 (BP Location: Left Arm)    Pulse (!) 45    Resp 16    SpO2 94%  Gen:   Awake, no distress   Resp:  Normal effort  MSK:   Moves extremities without difficulty  Other:  Severe subconjunctival hemorrhage. No hyphema   Medical Decision Making  Medically screening exam initiated at 3:38 PM.  Appropriate orders placed.  Nikita E Cavanagh was informed that the remainder of the evaluation will be completed by another provider, this initial triage assessment does not replace that evaluation, and the importance of remaining in the ED until their evaluation is complete.     Suzy Bouchard, [place] 04/12/21 1545

## 2021-04-14 ENCOUNTER — Telehealth: Payer: Self-pay

## 2021-04-14 NOTE — Telephone Encounter (Signed)
Appt r/s for 04/15/21 d/t recent ED visit for subconjunctival hemorrhage.

## 2021-04-14 NOTE — Telephone Encounter (Signed)
Called and spoke with pt concerning upcoming appt 04/28/21. Pt stated he had recently been in the ED on 04/12/21 d/t subconjunctival hemorrhage. Pt was instructed by ED provider to hold Warfarin on 04/12/21 and then resume regular dosing. Scheduled appt for pt to have INR checked sooner on 04/15/21 to confirm INR is therapeutic. Pt verbalized understanding.

## 2021-04-15 ENCOUNTER — Other Ambulatory Visit: Payer: Self-pay

## 2021-04-15 ENCOUNTER — Ambulatory Visit: Payer: Medicare Other | Admitting: *Deleted

## 2021-04-15 DIAGNOSIS — I480 Paroxysmal atrial fibrillation: Secondary | ICD-10-CM | POA: Diagnosis not present

## 2021-04-15 DIAGNOSIS — Z7901 Long term (current) use of anticoagulants: Secondary | ICD-10-CM

## 2021-04-15 LAB — POCT INR: INR: 2.1 (ref 2.0–3.0)

## 2021-04-15 NOTE — Patient Instructions (Addendum)
Description   Continue taking 1 tablet daily except for 1.5 tablets on Tuesdays, Thursdays, and Saturdays.  Recheck INR in 1 week. Anticoagulation Clinic [phone], Main Number [phone]      Please call us at [phone] with an update from the eye specialist.

## 2021-04-17 DIAGNOSIS — F411 Generalized anxiety disorder: Secondary | ICD-10-CM | POA: Diagnosis not present

## 2021-04-17 DIAGNOSIS — Z0000 Encounter for general adult medical examination without abnormal findings: Secondary | ICD-10-CM | POA: Diagnosis not present

## 2021-04-17 DIAGNOSIS — G479 Sleep disorder, unspecified: Secondary | ICD-10-CM | POA: Diagnosis not present

## 2021-04-17 DIAGNOSIS — H1132 Conjunctival hemorrhage, left eye: Secondary | ICD-10-CM | POA: Diagnosis not present

## 2021-04-17 DIAGNOSIS — I700 Atherosclerosis of aorta: Secondary | ICD-10-CM | POA: Diagnosis not present

## 2021-04-18 ENCOUNTER — Telehealth: Payer: Self-pay | Admitting: *Deleted

## 2021-04-18 NOTE — Telephone Encounter (Signed)
Called pt since he was supposed to give [place] an update on the eye specialist appt he had on yesterday. Left a message for the pt to call back at [phone]

## 2021-04-21 NOTE — Telephone Encounter (Signed)
Pt called back and stated the eye specialist told him his eye would get better in 2 weeks or so and they did not make any other recommendations. Pt states he is still interested in switching to other medication-see note in last Anticoagulation visit encounter. Confirmed appt for tomorrow at 11am.

## 2021-04-22 ENCOUNTER — Encounter (INDEPENDENT_AMBULATORY_CARE_PROVIDER_SITE_OTHER): Payer: Self-pay

## 2021-04-22 ENCOUNTER — Ambulatory Visit: Payer: Medicare Other | Admitting: *Deleted

## 2021-04-22 ENCOUNTER — Other Ambulatory Visit: Payer: Self-pay

## 2021-04-22 DIAGNOSIS — I480 Paroxysmal atrial fibrillation: Secondary | ICD-10-CM | POA: Diagnosis not present

## 2021-04-22 DIAGNOSIS — Z7901 Long term (current) use of anticoagulants: Secondary | ICD-10-CM | POA: Diagnosis not present

## 2021-04-22 LAB — POCT INR: INR: 2.7 (ref 2.0–3.0)

## 2021-04-22 MED ORDER — APIXABAN 5 MG PO TABS: 5.0000 mg | ORAL_TABLET | Freq: Two times a day (BID) | ORAL | 5 refills | Status: DC

## 2021-04-22 NOTE — Patient Instructions (Signed)
Do not take anymore Warfarin and start taking Eliquis tomorrow at 8pm, then on Thursday start taking it at [place] everyday.  Anticoagulation Clinic [phone], Main Number [phone]

## 2021-04-23 ENCOUNTER — Telehealth: Payer: Self-pay | Admitting: *Deleted

## 2021-04-23 NOTE — Telephone Encounter (Signed)
Pt called and stated that he could not afford his Eliquis because it was going to be $461 for a 1 month supply.   Informed pt that that was probably part of his deductible and then it should be $37/month or $74/3 month supply. Pt agreed to pay for the Eliquis if that was his deductible.  Spoke to [employer] [facility] per pt's current insurance card and per online pt should not have a deductible and Eliquis should be $37/month or $74/3 month supply.   Called pt back and informed him to make sure he brought his insurance card to the pharmacy.  Pt getting cash to pay for Eliquis deductible.   Called pharmacy and confirmed they had pt's correct insurance card on file. However, they stated that pt should still pay the $461 to meet his deductible. Pharmacy stated that further questions should go to [employer].   [facility]- was on the phone for 31 minutes but was not able to get a clear answer. Asked insurance company what was pt's deductible and after several minutes they stated pt has a $505/yearly deductible. Asked how much should pt's Eliquis be next month and after several minutes was told $200.00.  Asked how much Eliquis should cost after pt meets deductible but insurance staff member was not able to give an answer. Requested to speak to staff members supervisor because I was unsure if staff member knew what I was asking. Was not able to speak to supervisor or staff member because the phone became disconnected or I was hung up on.   Called pt back  at 5:53 pm to make sure he was able to get the medication and to update him. However was not able to get in touch with him.   Called pt while he was walking into [facility] to get the medication. Updated him and informed him that we are still trying to clarify the price of his Eliquis. Pt stated that he would go ahead and pay for the medication. He is suppose to start taking it tonight. Informed him that we would give him a call tomorrow  and update him after we are able to clarify cost.

## 2021-04-24 NOTE — Telephone Encounter (Signed)
Called his insurance again, he has a $505 rx deductible, has spent $629 so far and paid through this. Subsequent Eliquis copay is 5% of drug cost (Tier 3 medication) which would be ~$25 if pharmacy is using $500 as base price of med. Left detailed message for pt with this info.

## 2021-04-24 NOTE — Telephone Encounter (Signed)
Called and left a message and stated for the pt to call back

## 2021-05-02 DIAGNOSIS — H1132 Conjunctival hemorrhage, left eye: Secondary | ICD-10-CM | POA: Diagnosis not present

## 2021-05-02 DIAGNOSIS — H02885 Meibomian gland dysfunction left lower eyelid: Secondary | ICD-10-CM | POA: Diagnosis not present

## 2021-05-26 DIAGNOSIS — I10 Essential (primary) hypertension: Secondary | ICD-10-CM | POA: Diagnosis not present

## 2021-05-26 DIAGNOSIS — F411 Generalized anxiety disorder: Secondary | ICD-10-CM | POA: Diagnosis not present

## 2021-05-26 DIAGNOSIS — G479 Sleep disorder, unspecified: Secondary | ICD-10-CM | POA: Diagnosis not present

## 2021-05-26 DIAGNOSIS — K130 Diseases of lips: Secondary | ICD-10-CM | POA: Diagnosis not present

## 2021-05-29 DIAGNOSIS — N401 Enlarged prostate with lower urinary tract symptoms: Secondary | ICD-10-CM | POA: Diagnosis not present

## 2021-05-29 DIAGNOSIS — N1831 Chronic kidney disease, stage 3a: Secondary | ICD-10-CM | POA: Diagnosis not present

## 2021-05-29 DIAGNOSIS — Z8551 Personal history of malignant neoplasm of bladder: Secondary | ICD-10-CM | POA: Diagnosis not present

## 2021-05-29 DIAGNOSIS — N200 Calculus of kidney: Secondary | ICD-10-CM | POA: Diagnosis not present

## 2021-07-01 ENCOUNTER — Encounter: Payer: Self-pay | Admitting: Podiatry

## 2021-07-01 ENCOUNTER — Ambulatory Visit: Payer: Medicare Other | Admitting: Podiatry

## 2021-07-01 DIAGNOSIS — M79674 Pain in right toe(s): Secondary | ICD-10-CM | POA: Diagnosis not present

## 2021-07-01 DIAGNOSIS — B351 Tinea unguium: Secondary | ICD-10-CM

## 2021-07-01 DIAGNOSIS — E1121 Type 2 diabetes mellitus with diabetic nephropathy: Secondary | ICD-10-CM | POA: Diagnosis not present

## 2021-07-01 DIAGNOSIS — M79675 Pain in left toe(s): Secondary | ICD-10-CM | POA: Diagnosis not present

## 2021-07-01 DIAGNOSIS — K130 Diseases of lips: Secondary | ICD-10-CM | POA: Insufficient documentation

## 2021-07-01 DIAGNOSIS — F411 Generalized anxiety disorder: Secondary | ICD-10-CM | POA: Insufficient documentation

## 2021-07-10 NOTE — Progress Notes (Signed)
?  Subjective:  ?Patient ID: Cody Thomas, male    DOB: 1930-11-29,  MRN: [id number] ? ?Cody Thomas presents to clinic today for at risk foot care. Pt has h/o NIDDM with chronic kidney disease and painful thick toenails that are difficult to trim. Pain interferes with ambulation. Aggravating factors include wearing enclosed shoe gear. Pain is relieved with periodic professional debridement. ? ?Last known HgA1c was in the 6% range. Patient did not check blood glucose today. ? ?New problem(s): None.  ? ?PCP is Cari Caraway, MD , and last visit was April, 2023. ? ?Allergies  ?Allergen Reactions  ? Rosuvastatin   ?  Nightmares in high dosage ?Other reaction(s): nightmares when taken in LARGE  doses ?Other reaction(s): nightmares when taken in LARGE  doses ?Other reaction(s): nightmares when taken in LARGE  doses  ? Sertraline Diarrhea  ?  Other reaction(s): diarrhea  ? Simvastatin   ?  Nightmares in high dosage ?Other reaction(s): nightmares when taken in LARGE  doses ?Other reaction(s): nightmares when taken in LARGE  doses ?Other reaction(s): nightmares when taken in LARGE  doses  ? ? ?Review of Systems: Negative except as noted in the HPI. ? ?Objective: No changes noted in today's physical examination. ? ?There were no vitals filed for this visit. ? ?Cody Thomas is a pleasant 88 y.o. male in NAD. AAO X 3. ? ?Vascular Examination: ?CFT immediate b/l LE. Faintly palpable DP/PT pulses b/l LE. Digital hair sparse b/l. Skin temperature gradient WNL b/l. No pain with calf compression b/l. No edema noted b/l. No cyanosis or clubbing noted b/l LE. ? ?Dermatological Examination: ?Pedal skin thin, shiny and atrophic b/l LE.  No open wounds b/l. No interdigital macerations b/l. Toenails 1-5 b/l elongated, thickened, discolored with subungual debris. +Tenderness with dorsal palpation of nailplates. No hyperkeratotic or porokeratotic lesions present. ? ?Musculoskeletal Examination: ?Normal muscle strength 5/5  to all lower extremity muscle groups bilaterally. HAV with bunion deformity noted b/l LE.Marland Kitchen No pain, crepitus or joint limitation noted with ROM b/l LE.  Patient ambulates independently without assistive aids. ? ?Neurological Examination: ?Protective sensation intact 5/5 intact bilaterally with 10g monofilament b/l. Vibratory sensation intact b/l. ? ? ?  Latest Ref Rng & Units 09/24/2020  ?  9:31 AM  ?Hemoglobin A1C  ?Hemoglobin-A1c 4.6 - 6.5 % 7.3    ? ?Assessment/Plan: ?1. Pain due to onychomycosis of toenails of both feet   ?2. Diabetic nephropathy associated with type 2 diabetes mellitus ([place])   ?  ? ?-Patient was evaluated and treated. All patient's and/or POA's questions/concerns answered on today's visit. ?-Continue diabetic foot care principles: inspect feet daily, monitor glucose as recommended by PCP and/or Endocrinologist, and follow prescribed diet per PCP, Endocrinologist and/or dietician. ?-Toenails 1-5 b/l were debrided in length and girth with sterile nail nippers and dremel without iatrogenic bleeding.  ?-Patient/POA to call should there be question/concern in the interim.  ? ?Return in about 3 months (around 10/01/2021). ? ?Marzetta Board, DPM  ?

## 2021-08-13 DIAGNOSIS — F419 Anxiety disorder, unspecified: Secondary | ICD-10-CM | POA: Diagnosis not present

## 2021-09-06 ENCOUNTER — Other Ambulatory Visit: Payer: Self-pay | Admitting: Cardiology

## 2021-09-08 ENCOUNTER — Other Ambulatory Visit: Payer: Self-pay

## 2021-09-08 MED ORDER — METOPROLOL TARTRATE 25 MG PO TABS: 25.0000 mg | ORAL_TABLET | Freq: Two times a day (BID) | ORAL | 0 refills | Status: DC

## 2021-09-12 ENCOUNTER — Other Ambulatory Visit: Payer: Self-pay | Admitting: Endocrinology

## 2021-09-12 DIAGNOSIS — E1165 Type 2 diabetes mellitus with hyperglycemia: Secondary | ICD-10-CM

## 2021-10-06 ENCOUNTER — Other Ambulatory Visit: Payer: Self-pay | Admitting: Cardiology

## 2021-10-06 ENCOUNTER — Ambulatory Visit: Payer: Medicare Other | Admitting: Podiatry

## 2021-10-06 ENCOUNTER — Encounter: Payer: Self-pay | Admitting: Podiatry

## 2021-10-06 DIAGNOSIS — M79675 Pain in left toe(s): Secondary | ICD-10-CM

## 2021-10-06 DIAGNOSIS — E1121 Type 2 diabetes mellitus with diabetic nephropathy: Secondary | ICD-10-CM

## 2021-10-06 DIAGNOSIS — M79674 Pain in right toe(s): Secondary | ICD-10-CM

## 2021-10-06 DIAGNOSIS — B351 Tinea unguium: Secondary | ICD-10-CM

## 2021-10-11 IMAGING — CT CT HEAD W/O CM
3 series · 14 of 47 positions shown, 16 images · non-contrast
Comparison: CT head 09/12/2008

CLINICAL DATA: Syncope/presyncope. Cardiac cause suspected.

EXAM:
CT HEAD WITHOUT CONTRAST
TECHNIQUE: Contiguous axial images were obtained from the base of the skull
through the vertex without intravenous contrast.

[Series 3: head 5.0 h30s · axial · 0.49mm/px · z∈[-47,+88]mm · 8 of 33 slices shown, 10 images]
[im 3/33  brain]
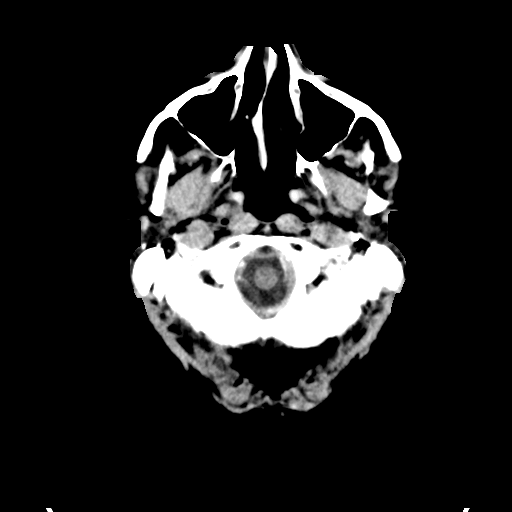
[im 3/33  bone]
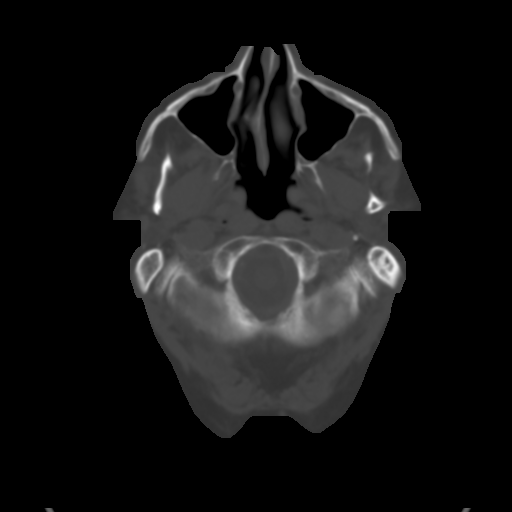
[im 7/33  brain]
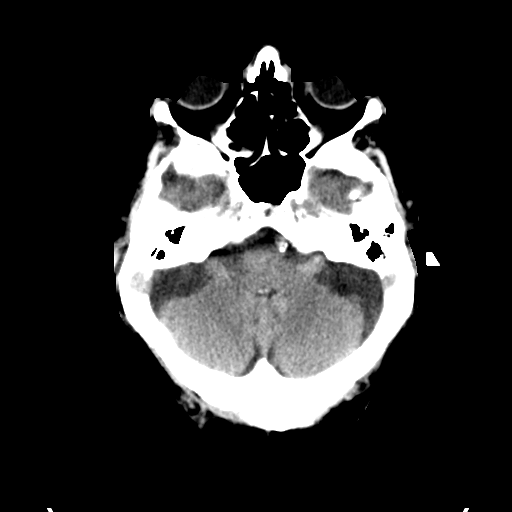
[im 10/33  brain]
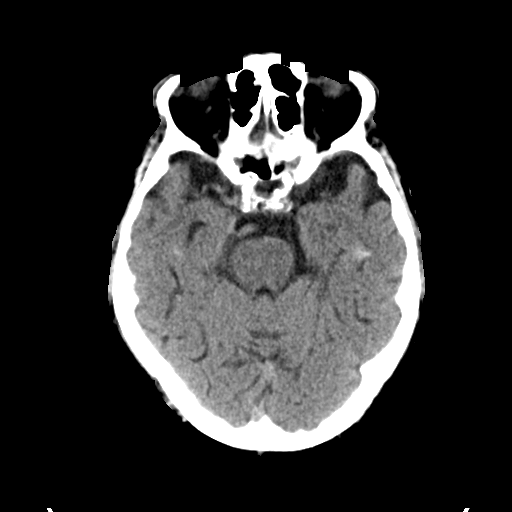
[im 15/33  brain]
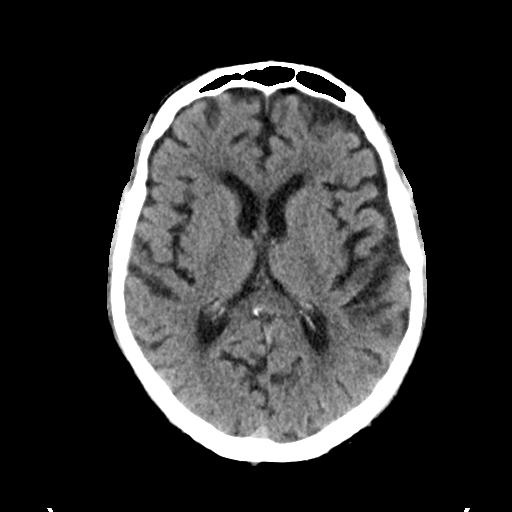
[im 18/33  brain]
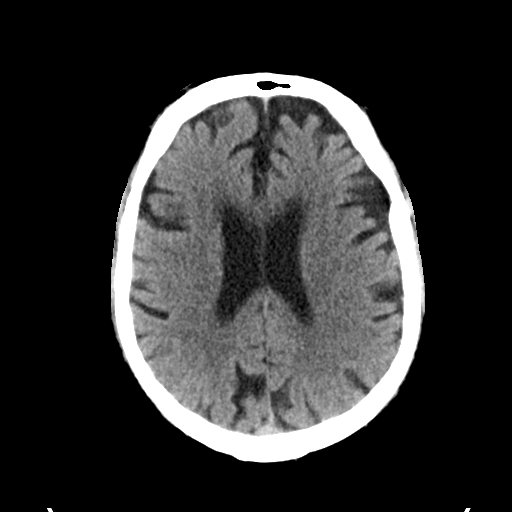
[im 18/33  bone]
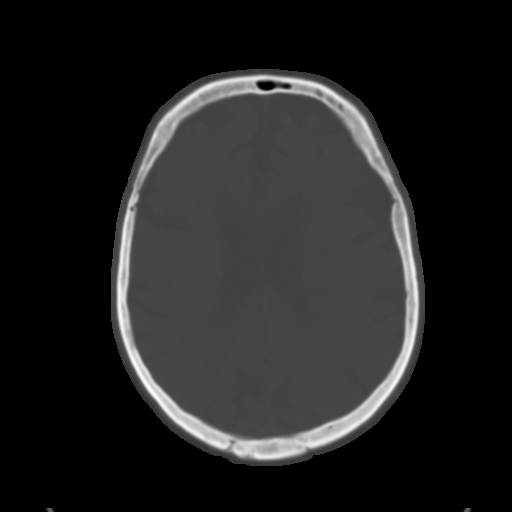
[im 23/33  brain]
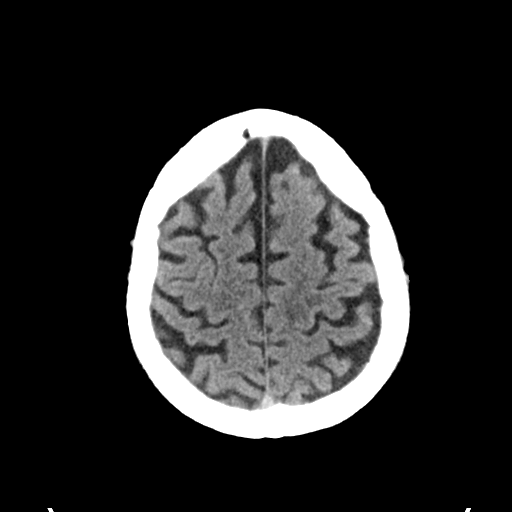
[im 26/33  brain]
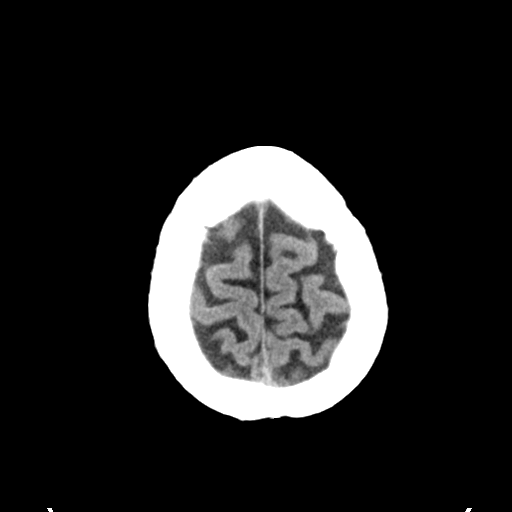
[im 30/33  brain]
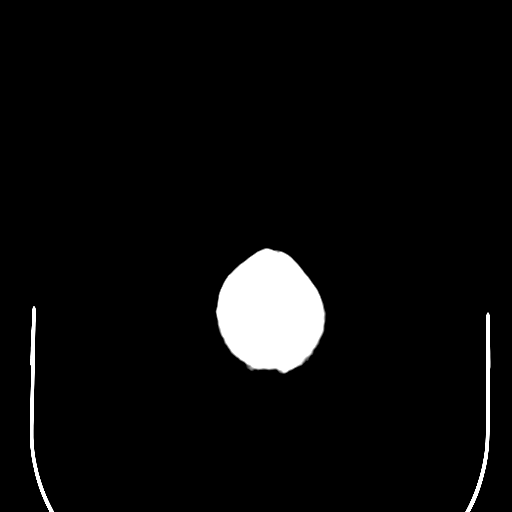

[Series 5: head 3.0 mpr cor · coronal · 0.32mm/px · 3 of 75 slices shown]
[im 25/75  brain]
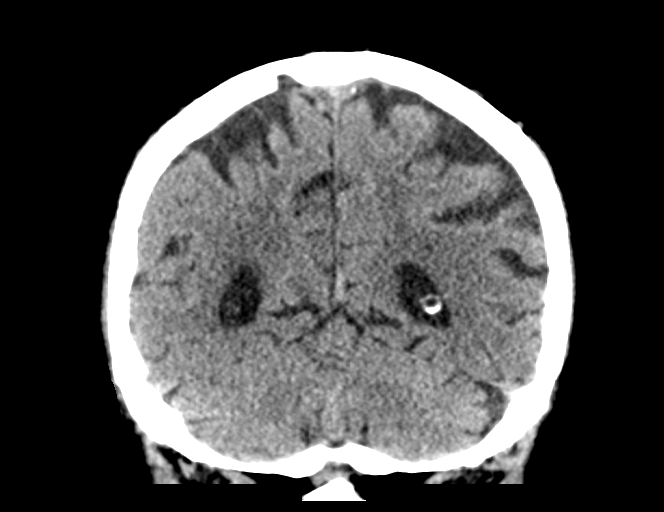
[im 33/75  brain]
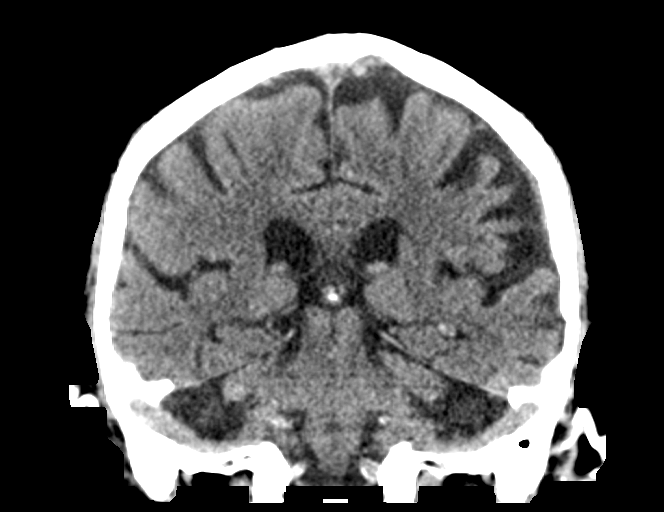
[im 42/75  brain]
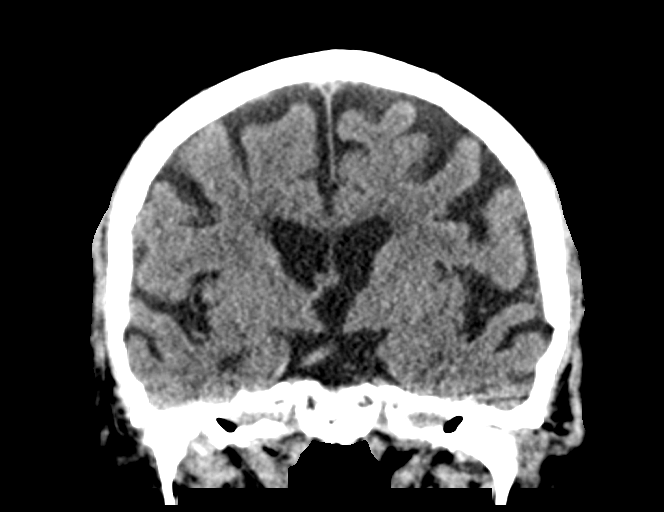

[Series 6: head 3.0 mpr sag · sagittal · 0.32mm/px · 3 of 67 slices shown]
[im 23/67  brain]
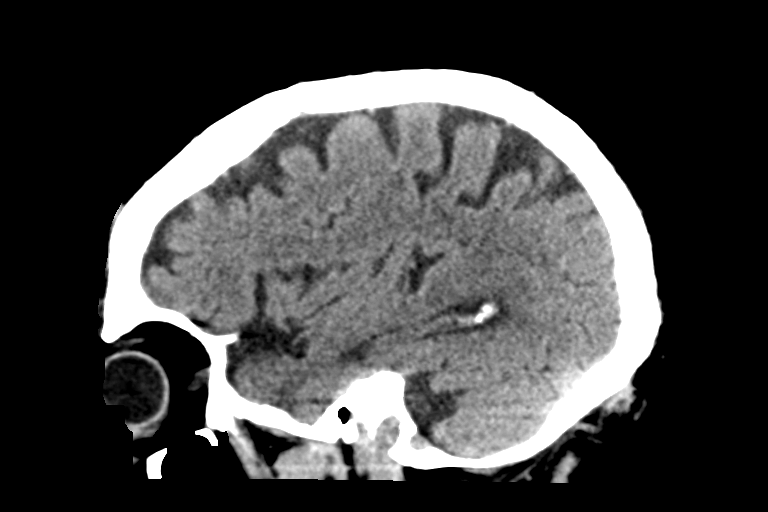
[im 34/67  brain]
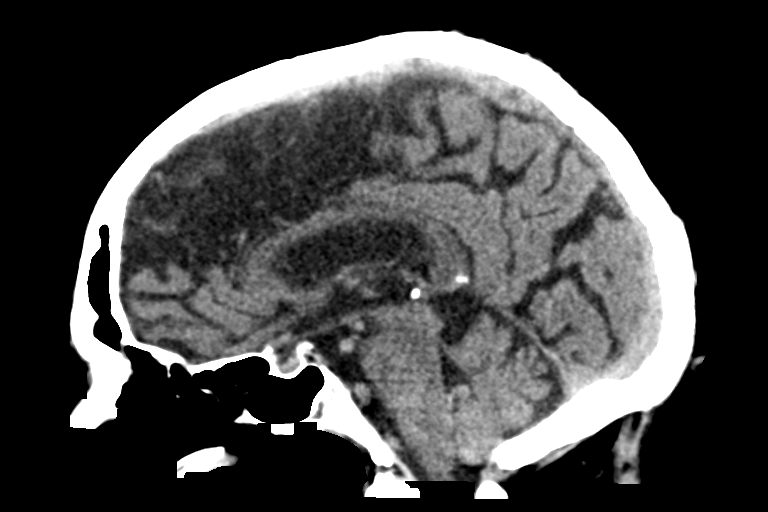
[im 45/67  brain]
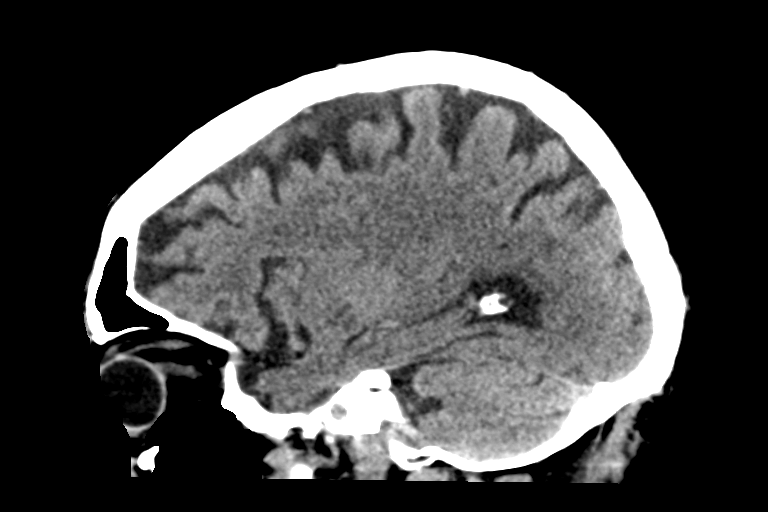

[14 of 47 positions shown; findings below may reference images not displayed]

FINDINGS: Brain: Mild atrophy and white matter changes progressed over the
last 10 years. Basal ganglia are intact. No acute or focal cortical
abnormalities are present. No acute infarct, hemorrhage, or mass
lesion is present.

No significant extraaxial fluid collection is present. The
ventricles are of normal size. The brainstem and cerebellum are
within normal limits.

Vascular: Extensive vascular calcifications are present. No
hyperdense vessel is present.

Skull: Insert normal skull No significant extracranial soft tissue
lesion is present.

Sinuses/Orbits: Mild mucosal thickening is present in the ethmoid
air cells. The paranasal sinuses and mastoid air cells are otherwise
clear. The globes and orbits are within normal limits.
IMPRESSION: 1. Progressive atrophy and white matter disease. This likely
reflects the sequela of chronic microvascular ischemia.
2. No acute intracranial abnormality.

## 2021-10-12 NOTE — Progress Notes (Signed)
  Subjective:  Patient ID: Cody Thomas, male    DOB: 1930-11-24,  MRN: [id number]  [place] presents to clinic today for at risk foot care with history of diabetic neuropathy and painful elongated mycotic toenails 1-5 bilaterally which are tender when wearing enclosed shoe gear. Pain is relieved with periodic professional debridement.  Last A1c was 7.3%.  Patient did not check blood glucose today.  New problem(s): None.   PCP is Cari Caraway, MD , and last visit was  May 26, 2021  Allergies  Allergen Reactions   Rosuvastatin     Nightmares in high dosage Other reaction(s): nightmares when taken in LARGE  doses Other reaction(s): nightmares when taken in LARGE  doses Other reaction(s): nightmares when taken in LARGE  doses   Sertraline Diarrhea    Other reaction(s): diarrhea   Simvastatin     Nightmares in high dosage Other reaction(s): nightmares when taken in LARGE  doses Other reaction(s): nightmares when taken in LARGE  doses Other reaction(s): nightmares when taken in LARGE  doses    Review of Systems: Negative except as noted in the HPI.  Objective: No changes noted in today's physical examination. There were no vitals filed for this visit.  [name] is a pleasant 88 y.o. male in NAD. AAO X 3.  Vascular Examination: CFT immediate b/l LE. Faintly palpable DP/PT pulses b/l LE. Digital hair sparse b/l. Skin temperature gradient WNL b/l. No pain with calf compression b/l. No edema noted b/l. No cyanosis or clubbing noted b/l LE.  Dermatological Examination: Pedal skin thin, shiny and atrophic b/l LE.  No open wounds b/l. No interdigital macerations b/l. Toenails 1-5 b/l elongated, thickened, discolored with subungual debris. +Tenderness with dorsal palpation of nailplates. No hyperkeratotic or porokeratotic lesions present.  Musculoskeletal Examination: Normal muscle strength 5/5 to all lower extremity muscle groups bilaterally. HAV with bunion  deformity noted b/l LE.Marland Kitchen No pain, crepitus or joint limitation noted with ROM b/l LE.  Patient ambulates independently without assistive aids.  Neurological Examination: Protective sensation intact 5/5 intact bilaterally with 10g monofilament b/l. Vibratory sensation intact b/l.  Assessment/Plan: 1. Pain due to onychomycosis of toenails of both feet   2. Diabetic nephropathy associated with type 2 diabetes mellitus ([place])   -Consent given for treatment as described below: -Examined patient. -Mycotic toenails 1-5 bilaterally were debrided in length and girth with sterile nail nippers and dremel without incident. -Patient/POA to call should there be question/concern in the interim.   Return in about 3 months (around 01/06/2022).  Marzetta Board, DPM

## 2021-10-14 DIAGNOSIS — I10 Essential (primary) hypertension: Secondary | ICD-10-CM | POA: Diagnosis not present

## 2021-10-14 DIAGNOSIS — N1832 Chronic kidney disease, stage 3b: Secondary | ICD-10-CM | POA: Diagnosis not present

## 2021-10-14 DIAGNOSIS — E1159 Type 2 diabetes mellitus with other circulatory complications: Secondary | ICD-10-CM | POA: Diagnosis not present

## 2021-10-14 DIAGNOSIS — E1122 Type 2 diabetes mellitus with diabetic chronic kidney disease: Secondary | ICD-10-CM | POA: Diagnosis not present

## 2021-10-14 DIAGNOSIS — E785 Hyperlipidemia, unspecified: Secondary | ICD-10-CM | POA: Diagnosis not present

## 2021-10-26 ENCOUNTER — Other Ambulatory Visit: Payer: Self-pay | Admitting: Cardiology

## 2021-10-26 DIAGNOSIS — I480 Paroxysmal atrial fibrillation: Secondary | ICD-10-CM

## 2021-10-27 NOTE — Telephone Encounter (Signed)
Prescription refill request for Eliquis received. Indication:Afib Last office visit:upcoming Scr:1.4 Age: 88 Weight:78.3 kg  Prescription refilled

## 2021-11-08 ENCOUNTER — Other Ambulatory Visit: Payer: Self-pay | Admitting: Cardiology

## 2021-11-14 ENCOUNTER — Ambulatory Visit: Payer: Medicare Other | Attending: Cardiology | Admitting: Cardiology

## 2021-11-14 ENCOUNTER — Encounter: Payer: Self-pay | Admitting: Cardiology

## 2021-11-14 VITALS — BP 138/70 | HR 76 | Ht 64.0 in | Wt 164.8 lb

## 2021-11-14 DIAGNOSIS — I2510 Atherosclerotic heart disease of native coronary artery without angina pectoris: Secondary | ICD-10-CM

## 2021-11-14 DIAGNOSIS — I10 Essential (primary) hypertension: Secondary | ICD-10-CM | POA: Diagnosis not present

## 2021-11-14 DIAGNOSIS — I480 Paroxysmal atrial fibrillation: Secondary | ICD-10-CM | POA: Diagnosis not present

## 2021-11-14 MED ORDER — METOPROLOL TARTRATE 25 MG PO TABS: ORAL_TABLET | ORAL | 3 refills | Status: DC

## 2021-11-14 NOTE — Patient Instructions (Signed)
Medication Instructions:  The current medical regimen is effective;  continue present plan and medications.  *If you need a refill on your cardiac medications before your next appointment, please call your pharmacy*  Follow-Up: At [facility], you and your health needs are our priority.  As part of our continuing mission to provide you with exceptional heart care, we have created designated Provider Care Teams.  These Care Teams include your primary Cardiologist (physician) and Advanced Practice Providers (APPs -  Physician Assistants and Nurse Practitioners) who all work together to provide you with the care you need, when you need it.  We recommend signing up for the patient portal called "MyChart".  Sign up information is provided on this After Visit Summary.  MyChart is used to connect with patients for Virtual Visits (Telemedicine).  Patients are able to view lab/test results, encounter notes, upcoming appointments, etc.  Non-urgent messages can be sent to your provider as well.   To learn more about what you can do with MyChart, go to [email or web address].    Your next appointment:   1 year(s)  The format for your next appointment:   In Person  Provider:   Mark Skains, MD      Important Information About Sugar       

## 2021-11-14 NOTE — Progress Notes (Signed)
Cardiology Office Note:    Date:  11/14/2021   ID:  Cody Thomas, DOB [birth year], MRN [id number]  PCP:  Cari Caraway, MD   [facility] Providers Cardiologist:  Candee Furbish, MD     Referring MD: Cari Caraway, MD     History of Present Illness:    Cody Thomas is a 88 y.o. male here for the follow-up of coronary artery disease with prior CABG in 2013.  Has atrial fibrillation as well.  Unfortunately on April 13, 2020 his wife had a stroke in her sleep.  She is now at a facility, PEG tube.  Fairly drowsy most of the time.  Unable to mobilize.  He is very emotional about this obviously.  He was seen in the ED 04/12/2021 for left subconjunctival bleeding. He reported runny nose and itching eyes for the past few weeks. He used eyedrops that morning and noticed his left eye was completely bloodshot and swollen. It was determined this was due to subconjunctival hemorrhaging from rubbing his eyes. INR was 3.3 with normal visual acuity for the patient. His next dose of warfarin was held and he was discharged.   Previously had an episode of near syncope where he felt as though he was going to pass out while sitting in his chair.  An event monitor on 05/26/2019 showed multiple episodes of atrial flutter with variable conduction and multiple episodes of nonsustained ventricular tachycardia longest was 9 seconds.  He also had a pause of greater than 3 seconds.  This pause was during conversion of atrial flutter to normal sinus rhythm.  He then underwent an echocardiogram in 2021 that showed normal ejection fraction.  EP evaluated him.  Known pacemaker needed.  Had prior COVID infection with antibody treatment.  Today, he is accompanied by his son. He says he is "hanging in there." He states he has been feeling up and down.  He states that he had a couple of days a month ago where he felt really lightheaded. A family member who is an EMT came to see him and said his oxygen was  low.  He ran out of metoprolol and has not taken it for the past two days. Additionally, his Coumadin was recently switched to Eliquis.   His wife passed away in 11-Dec-2020 after somewhere over 20 years of marriage.   He denies any palpitations, chest pain, shortness of breath, or peripheral edema. No headaches, syncope, orthopnea, or PND.   Past Medical History:  Diagnosis Date   Anxiety    Bunion 09/29/2011   Cancer [facility])    nose/Dr. Albertini;skin   Chronic constipation    Chronic kidney disease    stage 111   Coronary atherosclerosis of native coronary artery    2009 LAD CIRC DES   Depression    doesn't take any meds for this   Diabetes mellitus    takes Januvia daily   Dizziness    Enlarged prostate    but doesn't require meds at present   GERD (gastroesophageal reflux disease)    TUMS prn   H/O hiatal hernia    Headache(784.0)    History of gout    doesn't require meds   Hx of cardiac cath    Hyperlipidemia    Crestor 3 x wk and Zetia daily   Hypertension    takes Hyzaar daily   Insomnia    doesn't require meds at present time   Joint pain    Onychomycosis 10/05/2012  x 10   PONV (postoperative nausea and vomiting)    pt states extremely sick   Shortness of breath    with exertion    Past Surgical History:  Procedure Laterality Date   bladder cancer     CARDIAC CATHETERIZATION  2013   cataracts removed     CHOLECYSTECTOMY     COLONOSCOPY     CORONARY ANGIOPLASTY     2 stents from 2009   [place]  10/29/2011   Procedure: CORONARY ARTERY BYPASS GRAFTING (CABG);  Surgeon: Ivin Poot, MD;  Location: [facility];  Service: Open Heart Surgery;  Laterality: N/A;   cyst removed from finger     right hand   EYE SURGERY     bilateral cataracts   HEMORRHOID SURGERY     HERNIA REPAIR     x 2;inguinal   KIDNEY STONE SURGERY     LEFT HEART CATHETERIZATION WITH CORONARY ANGIOGRAM N/A 10/21/2011   Procedure: LEFT HEART CATHETERIZATION  WITH CORONARY ANGIOGRAM;  Surgeon: Candee Furbish, MD;  Location[facility] C[facility] LAB;  Service: Cardiovascular;  Laterality: N/A;   rotator cuff surgery     right    skin cancer removed      Current Medications: Current Meds  Medication Sig   acetaminophen (TYLENOL) 500 MG tablet Take by mouth.   azelastine (ASTELIN) 0.1 % nasal spray Place 1 spray into both nostrils 2 (two) times daily.   B Complex Vitamins (VITAMIN B COMPLEX) TABS See admin instructions.   Blood Glucose Monitoring Suppl (BAYER CONTOUR LINK 2.4) w/Device KIT Check sugar once daily.   calcium carbonate (OS-CAL) 1250 (500 Ca) MG chewable tablet Chew by mouth.   calcium carbonate (TUMS - DOSED IN MG ELEMENTAL CALCIUM) 500 MG chewable tablet 2 tablets as needed for indegestion   Coenzyme Q10-Vitamin E 100-1 MG-UNT/5ML SYRP Take 100 mg by mouth daily. Take 2 tablespoons (100MG) Daily   ELIQUIS 5 MG TABS tablet TAKE 1 TABLET(5 MG) BY MOUTH TWICE DAILY   escitalopram (LEXAPRO) 5 MG tablet 0.5 tablets   fluticasone (FLONASE) 50 MCG/ACT nasal spray Place 1 spray into both nostrils daily as needed for allergies or rhinitis.   fluticasone (FLONASE) 50 MCG/ACT nasal spray Place into the nose.   glucose blood test strip Use as instructed check sugar once daily.   losartan-hydrochlorothiazide (HYZAAR) 100-25 MG tablet TAKE 1 TABLET BY MOUTH DAILY   Omega-3 Fatty Acids (FISH OIL) 1200 MG CPDR 2 capsules   ondansetron (ZOFRAN) 4 MG tablet Take by mouth.   repaglinide (PRANDIN) 0.5 MG tablet TAKE 1 TABLET BY MOUTH EVERY MORNING BEFORE BREAKFAST AND BEFORE DINNER   rosuvastatin (CRESTOR) 20 MG tablet Take 20 mg by mouth See admin instructions. Take 1 tablet by mouth three times weekly.   traZODone (DESYREL) 50 MG tablet Take 50 mg by mouth at bedtime as needed.   Zinc 100 MG TABS 1 tablet   [DISCONTINUED] metoprolol tartrate (LOPRESSOR) 25 MG tablet TAKE 1 TABLET(25 MG) BY MOUTH TWICE DAILY     Allergies:   Rosuvastatin, Sertraline, and  Simvastatin   Social History   Socioeconomic History   Marital status: Married    Spouse name: Not on file   Number of children: 2   Years of education: HS   Highest education level: Not on file  Occupational History   Occupation: Retired  Tobacco Use   Smoking status: Former    Packs/day: 1.50    Years: 30.00    Total pack  years: 45.00    Types: Cigarettes    Quit date: 03/02/1984    Years since quitting: 37.7   Smokeless tobacco: Never  Vaping Use   Vaping Use: Never used  Substance and Sexual Activity   Alcohol use: No    Alcohol/week: 0.0 standard drinks of alcohol   Drug use: No   Sexual activity: Not Currently  Other Topics Concern   Not on file  Social History Narrative   Lives at home with his wife and great-granddaughter.   Right-handed.   Occasional caffeine use.   Social Determinants of Health   Financial Resource Strain: Not on file  Food Insecurity: Not on file  Transportation Needs: Not on file  Physical Activity: Not on file  Stress: Not on file  Social Connections: Not on file     Family History: The patient's family history includes Cancer in his brother; Heart disease in his mother; Stroke in his father.  ROS:   Please see the history of present illness.   All other systems reviewed and are negative.  EKGs/Labs/Other Studies Reviewed:    The following studies were reviewed:  Cardiac Monitor 05/2019: Multiple episodes atrial flutter with variable conduction Multiple episodes of non sustained ventricular tachycardia (longest 9 seconds) Pause greater than 3 seconds (during conversion of atrial flutter to NSR)   Please refer to EP for evaluation of near syncope in the context of findings from monitor. He is on Eliquis.  Bilateral Carotid Duplex Doppler 03/20/2019: Summary:  Right Carotid: Velocities in the right ICA are consistent with a 1-39%  stenosis.                 No significant change since study done 10/2011.   Left Carotid:  Velocities in the left ICA are consistent with a 1-39%  stenosis.                No significant change since study done 10/2011.   Vertebrals: Bilateral vertebral arteries demonstrate antegrade flow.    Echo 03/19/2019: 1. Left ventricular ejection fraction, by visual estimation, is 60 to  65%. The left ventricle has normal function. Left ventricular septal wall  thickness was mildly increased. There is mildly increased left ventricular  hypertrophy.   2. The left ventricle has no regional wall motion abnormalities.   3. Global right ventricle has mildly reduced systolic function.The right  ventricular size is mildly enlarged. No increase in right ventricular wall  thickness.   4. Left atrial size was normal.   5. Right atrial size was normal.   6. Mild mitral annular calcification.   7. Moderate calcification of the mitral valve leaflet(s).   8. Moderate thickening of the mitral valve leaflet(s).   9. The mitral valve is degenerative. Mild mitral valve regurgitation. No  evidence of mitral stenosis.  10. The tricuspid valve is normal in structure.  11. The aortic valve is tricuspid. Aortic valve regurgitation is trivial.  Mild aortic valve sclerosis without stenosis.  12. Pulmonic regurgitation is mild.  13. The pulmonic valve was not well visualized. Pulmonic valve  regurgitation is mild.  14. Normal pulmonary artery systolic pressure.  15. The inferior vena cava is normal in size with greater than 50%  respiratory variability, suggesting right atrial pressure of 3 mmHg.    CT Head 03/19/2019: FINDINGS: Brain: Mild atrophy and white matter changes progressed over the last 10 years. Basal ganglia are intact. No acute or focal cortical abnormalities are present. No acute infarct, hemorrhage, or mass  lesion is present.   No significant extraaxial fluid collection is present. The ventricles are of normal size. The brainstem and cerebellum are within normal limits.   Vascular:  Extensive vascular calcifications are present. No hyperdense vessel is present.   Skull: Insert normal skull No significant extracranial soft tissue lesion is present.   Sinuses/Orbits: Mild mucosal thickening is present in the ethmoid air cells. The paranasal sinuses and mastoid air cells are otherwise clear. The globes and orbits are within normal limits.   IMPRESSION: 1. Progressive atrophy and white matter disease. This likely reflects the sequela of chronic microvascular ischemia. 2. No acute intracranial abnormality.   EKG: EKG is personally reviewed.  11/14/21: Atrial fibrillation with PVCs. Rate 76 bpm.  Recent Labs: 11/28/2020: BUN 17; Creatinine, Ser 1.40; Potassium 3.6; Sodium 138  Recent Lipid Panel    Component Value Date/Time   CHOL 126 08/21/2019 0855   TRIG 187.0 (H) 08/21/2019 0855   HDL 38.40 (L) 08/21/2019 0855   CHOLHDL 3 08/21/2019 0855   VLDL 37.4 08/21/2019 0855   LDLCALC 50 08/21/2019 0855     Risk Assessment/Calculations:      Physical Exam:    VS:  BP 138/70   Pulse 76   Ht '5\' 4"'  (1.626 m)   Wt 164 lb 12.8 oz (74.8 kg)   SpO2 95%   BMI 28.29 kg/m     Wt Readings from Last 3 Encounters:  11/14/21 164 lb 12.8 oz (74.8 kg)  10/02/20 172 lb 9.6 oz (78.3 kg)  07/22/20 172 lb (78 kg)     GEN:  Well nourished, well developed in no acute distress HEENT: Normal NECK: No JVD; No carotid bruits LYMPHATICS: No lymphadenopathy CARDIAC: irregularly irregular, no murmurs, rubs, gallops RESPIRATORY:  Clear to auscultation without rales, wheezing or rhonchi  ABDOMEN: Soft, non-tender, non-distended MUSCULOSKELETAL:  No edema; No deformity  SKIN: Warm and dry NEUROLOGIC:  Alert and oriented x 3 PSYCHIATRIC:  Normal affect   ASSESSMENT:    1. Paroxysmal atrial fibrillation (HCC)   2. CAD in native artery   3. Primary hypertension     PLAN:    In order of problems listed above:  Permanent atrial fibrillation -Now taking Eliquis.  Quite  expensive for him however..  Well-controlled.  No changes made.  Continue to monitor here in the clinic medication management.  PVCs noted as well on ECG.  Near syncope - Appreciate EP evaluation.  He did not wish to have implantation of loop recorder.  No pacemaker needs.  He did have an episode over 2 days where he did feel lightheaded.  He states that his oxygen levels were low.  This was transient.  His son, Cody Thomas, who is with him today helping with historical items states that it may have been more anxiety.  Coronary artery disease post CABG - Doing well without any anginal symptoms.  Continue with current goal-directed medical therapy.  Hyperlipidemia - Continue Crestor 20 mg.  Last LDL 50.  Refills as needed for this medical management.  Grieving - Wife died in 12/08/20.  His son Cody Thomas here with him.  Follow up: 1 year.  Medication Adjustments/Labs and Tests Ordered: Current medicines are reviewed at length with the patient today.  Concerns regarding medicines are outlined above.  Orders Placed This Encounter  Procedures   EKG 12-Lead   Meds ordered this encounter  Medications   metoprolol tartrate (LOPRESSOR) 25 MG tablet    Sig: TAKE 1 TABLET(25 MG) BY MOUTH TWICE  DAILY    Dispense:  180 tablet    Refill:  3   Patient Instructions  Medication Instructions:  The current medical regimen is effective;  continue present plan and medications.  *If you need a refill on your cardiac medications before your next appointment, please call your pharmacy*  Follow-Up: At [facility], you and your health needs are our priority.  As part of our continuing mission to provide you with exceptional heart care, we have created designated Provider Care Teams.  These Care Teams include your primary Cardiologist (physician) and Advanced Practice Providers (APPs -  Physician Assistants and Nurse Practitioners) who all work together to provide you with the care you need, when you  need it.  We recommend signing up for the patient portal called "MyChart".  Sign up information is provided on this After Visit Summary.  MyChart is used to connect with patients for Virtual Visits (Telemedicine).  Patients are able to view lab/test results, encounter notes, upcoming appointments, etc.  Non-urgent messages can be sent to your provider as well.   To learn more about what you can do with MyChart, go to [email or web address].    Your next appointment:   1 year(s)  The format for your next appointment:   In Person  Provider:   Candee Furbish, MD       Important Information About Sugar          I,Breanna Adamick,acting as a scribe for Candee Furbish, MD.,have documented all relevant documentation on the behalf of Candee Furbish, MD,as directed by  Candee Furbish, MD while in the presence of Candee Furbish, MD.   I, Candee Furbish, MD, have reviewed all documentation for this visit. The documentation on 11/14/21 for the exam, diagnosis, procedures, and orders are all accurate and complete.    Signed, Candee Furbish, MD  11/14/2021 10:26 AM    [facility]

## 2021-11-21 ENCOUNTER — Other Ambulatory Visit: Payer: Self-pay | Admitting: Cardiology

## 2022-01-02 DIAGNOSIS — L814 Other melanin hyperpigmentation: Secondary | ICD-10-CM | POA: Diagnosis not present

## 2022-01-02 DIAGNOSIS — L218 Other seborrheic dermatitis: Secondary | ICD-10-CM | POA: Diagnosis not present

## 2022-01-02 DIAGNOSIS — D1801 Hemangioma of skin and subcutaneous tissue: Secondary | ICD-10-CM | POA: Diagnosis not present

## 2022-01-02 DIAGNOSIS — L821 Other seborrheic keratosis: Secondary | ICD-10-CM | POA: Diagnosis not present

## 2022-01-08 DIAGNOSIS — Z23 Encounter for immunization: Secondary | ICD-10-CM | POA: Diagnosis not present

## 2022-01-19 ENCOUNTER — Encounter: Payer: Self-pay | Admitting: Podiatry

## 2022-01-19 ENCOUNTER — Ambulatory Visit: Payer: Medicare Other | Admitting: Podiatry

## 2022-01-19 DIAGNOSIS — E1121 Type 2 diabetes mellitus with diabetic nephropathy: Secondary | ICD-10-CM

## 2022-01-19 DIAGNOSIS — M79675 Pain in left toe(s): Secondary | ICD-10-CM

## 2022-01-19 DIAGNOSIS — M79674 Pain in right toe(s): Secondary | ICD-10-CM

## 2022-01-19 DIAGNOSIS — B351 Tinea unguium: Secondary | ICD-10-CM | POA: Diagnosis not present

## 2022-01-24 NOTE — Progress Notes (Signed)
  Subjective:  Patient ID: Cody Thomas, male    DOB: 1930/03/28,  MRN: [id number]  [place] presents to clinic today for at risk foot care. Pt has h/o NIDDM with chronic kidney disease and painful elongated mycotic toenails 1-5 bilaterally which are tender when wearing enclosed shoe gear. Pain is relieved with periodic professional debridement.  Chief Complaint  Patient presents with   Nail Problem    Diabetic foot care BS-did not check today A1C-"below 7" PCP-Wendy McNeil PCP VST-1 month ago   New problem(s): None.   PCP is Cari Caraway, MD.  Allergies  Allergen Reactions   Rosuvastatin     Nightmares in high dosage Other reaction(s): nightmares when taken in LARGE  doses Other reaction(s): nightmares when taken in LARGE  doses Other reaction(s): nightmares when taken in LARGE  doses   Sertraline Diarrhea    Other reaction(s): diarrhea   Simvastatin     Nightmares in high dosage Other reaction(s): nightmares when taken in LARGE  doses Other reaction(s): nightmares when taken in LARGE  doses Other reaction(s): nightmares when taken in LARGE  doses    Review of Systems: Negative except as noted in the HPI.  Objective: No changes noted in today's physical examination.  Camden E Knittel is a pleasant 88 y.o. male in NAD. AAO x 3.  Vascular Examination: CFT immediate b/l LE. Faintly palpable DP/PT pulses b/l LE. Digital hair sparse b/l. Skin temperature gradient WNL b/l. No pain with calf compression b/l. No edema noted b/l. No cyanosis or clubbing noted b/l LE.  Dermatological Examination: Pedal skin thin, shiny and atrophic b/l LE.  No open wounds b/l. No interdigital macerations b/l. Toenails 1-5 b/l elongated, thickened, discolored with subungual debris. +Tenderness with dorsal palpation of nailplates. No hyperkeratotic or porokeratotic lesions present.  Musculoskeletal Examination: Normal muscle strength 5/5 to all lower extremity muscle groups  bilaterally. HAV with bunion deformity noted b/l LE.Marland Kitchen No pain, crepitus or joint limitation noted with ROM b/l LE.  Patient ambulates independently without assistive aids.  Neurological Examination: Protective sensation intact 5/5 intact bilaterally with 10g monofilament b/l. Vibratory sensation intact b/l.  Assessment/Plan: 1. Pain due to onychomycosis of toenails of both feet   2. Diabetic nephropathy associated with type 2 diabetes mellitus ([place])     No orders of the defined types were placed in this encounter.   -Consent given for treatment as described below: -Examined patient. -Continue foot and shoe inspections daily. Monitor blood glucose per PCP/Endocrinologist's recommendations. -Mycotic toenails 1-5 bilaterally were debrided in length and girth with sterile nail nippers and dremel without incident. -Patient/POA to call should there be question/concern in the interim.   Return in about 3 months (around 04/21/2022).  Marzetta Board, DPM

## 2022-02-04 ENCOUNTER — Other Ambulatory Visit: Payer: Self-pay | Admitting: Endocrinology

## 2022-02-04 DIAGNOSIS — E1165 Type 2 diabetes mellitus with hyperglycemia: Secondary | ICD-10-CM

## 2022-02-06 DIAGNOSIS — L570 Actinic keratosis: Secondary | ICD-10-CM | POA: Diagnosis not present

## 2022-04-07 ENCOUNTER — Other Ambulatory Visit: Payer: Self-pay | Admitting: Endocrinology

## 2022-04-07 DIAGNOSIS — E1165 Type 2 diabetes mellitus with hyperglycemia: Secondary | ICD-10-CM

## 2022-04-09 ENCOUNTER — Other Ambulatory Visit (INDEPENDENT_AMBULATORY_CARE_PROVIDER_SITE_OTHER): Payer: Medicare Other

## 2022-04-09 DIAGNOSIS — E1165 Type 2 diabetes mellitus with hyperglycemia: Secondary | ICD-10-CM

## 2022-04-10 LAB — BASIC METABOLIC PANEL
BUN: 21 mg/dL (ref 6–23)
CO2: 28 mEq/L (ref 19–32)
Calcium: 9.5 mg/dL (ref 8.4–10.5)
Chloride: 100 mEq/L (ref 96–112)
Creatinine, Ser: 1.65 mg/dL — ABNORMAL HIGH (ref 0.40–1.50)
GFR: 36.59 mL/min — ABNORMAL LOW (ref >60.00)
Glucose, Bld: 92 mg/dL (ref 70–99)
Potassium: 4.1 mEq/L (ref 3.5–5.1)
Sodium: 138 mEq/L (ref 135–145)

## 2022-04-10 LAB — HEMOGLOBIN A1C: Hgb A1c MFr Bld: 6.2 % (ref 4.6–6.5)

## 2022-04-15 ENCOUNTER — Encounter: Payer: Self-pay | Admitting: Endocrinology

## 2022-04-15 ENCOUNTER — Ambulatory Visit (INDEPENDENT_AMBULATORY_CARE_PROVIDER_SITE_OTHER): Payer: Medicare Other | Admitting: Endocrinology

## 2022-04-15 VITALS — BP 148/70 | HR 50 | Ht 64.0 in | Wt 164.2 lb

## 2022-04-15 DIAGNOSIS — E1169 Type 2 diabetes mellitus with other specified complication: Secondary | ICD-10-CM | POA: Diagnosis not present

## 2022-04-15 DIAGNOSIS — E669 Obesity, unspecified: Secondary | ICD-10-CM | POA: Diagnosis not present

## 2022-04-15 NOTE — Progress Notes (Signed)
Patient ID: Cody Thomas, male   DOB: 06-Mar-1930, 89 y.o.   MRN: [id number]   Reason for Appointment: Diabetes follow-up   History of Present Illness   Diagnosis: Type 2 DIABETES MELITUS  He has had mild diabetes which has been previously erratic partly because of dietary inconsistency and difficulty with weight loss He has not been continued on metformin because of renal dysfunction Was given low-dose Januvia instead but this was too expensive for him even though it was helping his control In 2014 he was given low dose Amaryl instead which appears to be keeping his blood sugars controlled  His blood sugars were not well controlled when he was seen in 1/16 with A1c 7.8  Subsequently Amaryl was stopped partly because of mild hypoglycemia  RECENT history:  A1c is due compared to 7.3 previously  Previous range 6.2-8.0 .  Last seen in 09/2020  Current blood sugar patterns, problems and management: Today not able to communicate with him easily because of significant hearing loss Although his blood sugars previously had been significantly high and he was told to take Januvia and Prandin blood sugars are much better now He is only taking Prandin currently Has not been taking his metformin since about 3/22 because of diarrhea  He appears to have lost significant weight since last time Also generally doing better with diet with eating less sweets although usually eating out because of his being single now His nonfasting lab glucose was 92 Because of his knee problems he does not do much walking  Monitors blood glucose: Once a day or less .    Glucometer:   Accu-Chek          Blood Glucose readings    PRE-MEAL Fasting Lunch Dinner Bedtime Overall  Glucose range: 92-121  147    Mean/median:        POST-MEAL PC Breakfast PC Lunch PC Dinner  Glucose range:  175, 160   Mean/median:      Previous range 177-294, mostly postprandial   Dietitian consultation last done  several years ago  Breakfast meals: Sometimes Cheerios cereal, oatmeal, sometimes an egg, toast and apple butter May have cookies for snacks   Wt Readings from Last 3 Encounters:  11/14/21 164 lb 12.8 oz (74.8 kg)  10/02/20 172 lb 9.6 oz (78.3 kg)  07/22/20 172 lb (78 kg)   DM LABS:  Lab Results  Component Value Date   HGBA1C 6.2 04/09/2022   HGBA1C 7.3 (H) 09/24/2020   HGBA1C 7.3 (H) 06/18/2020   Lab Results  Component Value Date   MICROALBUR 2.3 (H) 06/18/2020   LDLCALC 50 08/21/2019   CREATININE 1.65 (H) 04/09/2022   Lab on 04/09/2022  Component Date Value Ref Range Status   Sodium 04/09/2022 138  135 - 145 mEq/L Final   Potassium 04/09/2022 4.1  3.5 - 5.1 mEq/L Final   Chloride 04/09/2022 100  96 - 112 mEq/L Final   CO2 04/09/2022 28  19 - 32 mEq/L Final   Glucose, Bld 04/09/2022 92  70 - 99 mg/dL Final   BUN 04/09/2022 21  6 - 23 mg/dL Final   Creatinine, Ser 04/09/2022 1.65 (H)  0.40 - 1.50 mg/dL Final   GFR 04/09/2022 36.59 (L)  >60.00 mL/min Final   Calculated using the CKD-EPI Creatinine Equation (2021)   Calcium 04/09/2022 9.5  8.4 - 10.5 mg/dL Final   Hgb A1c MFr Bld 04/09/2022 6.2  4.6 - 6.5 % Final   Glycemic Control Guidelines  for People with Diabetes:Non Diabetic:  <6%Goal of Therapy: <7%Additional Action Suggested:  >8%      Allergies as of 04/15/2022       Reactions   Rosuvastatin    Nightmares in high dosage Other reaction(s): nightmares when taken in LARGE  doses Other reaction(s): nightmares when taken in LARGE  doses Other reaction(s): nightmares when taken in LARGE  doses   Sertraline Diarrhea   Other reaction(s): diarrhea   Simvastatin    Nightmares in high dosage Other reaction(s): nightmares when taken in LARGE  doses Other reaction(s): nightmares when taken in LARGE  doses Other reaction(s): nightmares when taken in LARGE  doses        Medication List        Accurate as of April 15, 2022 10:04 AM. If you have any questions,  ask your nurse or doctor.          STOP taking these medications    sitaGLIPtin 100 MG tablet Commonly known as: Januvia Stopped by: Elayne Snare, MD       TAKE these medications    acetaminophen 500 MG tablet Commonly known as: TYLENOL Take by mouth.   azelastine 0.1 % nasal spray Commonly known as: ASTELIN Place 1 spray into both nostrils 2 (two) times daily.   Bayer Contour Link 2.4 w/Device Kit Check sugar once daily.   calcium carbonate 1250 (500 Ca) MG chewable tablet Commonly known as: OS-CAL Chew by mouth.   calcium carbonate 500 MG chewable tablet Commonly known as: TUMS - dosed in mg elemental calcium 2 tablets as needed for indegestion   Coenzyme Q10-Vitamin E 100-1 MG-UNT/5ML Syrp Take 100 mg by mouth daily. Take 2 tablespoons (100MG) Daily   Eliquis 5 MG Tabs tablet Generic drug: apixaban TAKE 1 TABLET(5 MG) BY MOUTH TWICE DAILY   escitalopram 5 MG tablet Commonly known as: LEXAPRO 0.5 tablets   Fish Oil 1200 MG Cpdr 2 capsules   fluorouracil 5 % cream Commonly known as: EFUDEX Apply topically 2 (two) times daily.   fluticasone 50 MCG/ACT nasal spray Commonly known as: FLONASE Place 1 spray into both nostrils daily as needed for allergies or rhinitis.   fluticasone 50 MCG/ACT nasal spray Commonly known as: FLONASE Place into the nose.   glucose blood test strip Use as instructed check sugar once daily.   ketoconazole 2 % cream Commonly known as: NIZORAL Apply topically.   losartan-hydrochlorothiazide 100-25 MG tablet Commonly known as: HYZAAR TAKE 1 TABLET BY MOUTH DAILY   metoprolol tartrate 25 MG tablet Commonly known as: LOPRESSOR TAKE 1 TABLET(25 MG) BY MOUTH TWICE DAILY   ondansetron 4 MG tablet Commonly known as: ZOFRAN Take by mouth.   repaglinide 0.5 MG tablet Commonly known as: PRANDIN TAKE 1 TABLET BY MOUTH EVERY MORNING BEFORE BREAKFAST AND BEFORE DINNER   rosuvastatin 20 MG tablet Commonly known as:  CRESTOR Take 20 mg by mouth See admin instructions. Take 1 tablet by mouth three times weekly.   traMADol 50 MG tablet Commonly known as: ULTRAM   traMADol 50 MG tablet Commonly known as: ULTRAM TAKE 1 TABLET BY MOUTH EVERY 6 HOURS AS NEEDED   traZODone 50 MG tablet Commonly known as: DESYREL Take 50 mg by mouth at bedtime as needed.   triamcinolone 0.025 % cream Commonly known as: KENALOG Apply topically.   Vitamin B Complex Tabs See admin instructions.   Zinc 100 MG Tabs 1 tablet        Allergies:  Allergies  Allergen Reactions  Rosuvastatin     Nightmares in high dosage Other reaction(s): nightmares when taken in LARGE  doses Other reaction(s): nightmares when taken in LARGE  doses Other reaction(s): nightmares when taken in LARGE  doses   Sertraline Diarrhea    Other reaction(s): diarrhea   Simvastatin     Nightmares in high dosage Other reaction(s): nightmares when taken in LARGE  doses Other reaction(s): nightmares when taken in LARGE  doses Other reaction(s): nightmares when taken in LARGE  doses    Past Medical History:  Diagnosis Date   Anxiety    Bunion 09/29/2011   Cancer (HCC)    nose/Dr. Albertini;skin   Chronic constipation    Chronic kidney disease    stage 111   Coronary atherosclerosis of native coronary artery    2009 LAD CIRC DES   Depression    doesn't take any meds for this   Diabetes mellitus    takes Januvia daily   Dizziness    Enlarged prostate    but doesn't require meds at present   GERD (gastroesophageal reflux disease)    TUMS prn   H/O hiatal hernia    Headache(784.0)    History of gout    doesn't require meds   Hx of cardiac cath    Hyperlipidemia    Crestor 3 x wk and Zetia daily   Hypertension    takes Hyzaar daily   Insomnia    doesn't require meds at present time   Joint pain    Onychomycosis 10/05/2012   x 10   PONV (postoperative nausea and vomiting)    pt states extremely sick   Shortness of breath     with exertion    Past Surgical History:  Procedure Laterality Date   bladder cancer     CARDIAC CATHETERIZATION  2013   cataracts removed     CHOLECYSTECTOMY     COLONOSCOPY     CORONARY ANGIOPLASTY     2 stents from 2009   [place]  10/29/2011   Procedure: CORONARY ARTERY BYPASS GRAFTING (CABG);  Surgeon: Ivin Poot, MD;  Location: [facility];  Service: Open Heart Surgery;  Laterality: N/A;   cyst removed from finger     right hand   EYE SURGERY     bilateral cataracts   HEMORRHOID SURGERY     HERNIA REPAIR     x 2;inguinal   KIDNEY STONE SURGERY     LEFT HEART CATHETERIZATION WITH CORONARY ANGIOGRAM N/A 10/21/2011   Procedure: LEFT HEART CATHETERIZATION WITH CORONARY ANGIOGRAM;  Surgeon: Candee Furbish, MD;  Location[facility] CATH LAB;  Service: Cardiovascular;  Laterality: N/A;   rotator cuff surgery     right    skin cancer removed      Family History  Problem Relation Age of Onset   Heart disease Mother    Stroke Father    Cancer Brother     Social History:  reports that he quit smoking about 38 years ago. His smoking use included cigarettes. He has a 45.00 pack-year smoking history. He has never used smokeless tobacco. He reports that he does not drink alcohol and does not use drugs.  Review of Systems:  HYPERTENSION:  followed by PCP and cardiologist and blood pressure is recently controlled  He checks his blood pressure at home regularly but not remembering the exact readings He thinks blood pressure readings are usually [id number] systolic  Although blood pressure was low on first measurement it was about normal  on second attempt by myself  BP Readings from Last 3 Encounters:  04/15/22 (!) 148/70  11/14/21 138/70  04/12/21 137/89    RENAL dysfunction: Has mild persistent increase in creatinine with some variability  Lab Results  Component Value Date   CREATININE 1.65 (H) 04/09/2022   CREATININE 1.40 11/28/2020   CREATININE 1.56 (H)  09/24/2020     HYPERLIPIDEMIA: The lipid abnormality consists of elevated LDL and low HDL treated with Crestor  Controlled taking Crestor 20 mg  Lipids followed by PCP and cardiologist  Last LDL 50   done by PCP in 2/22   Lab Results  Component Value Date   CHOL 126 08/21/2019   HDL 38.40 (L) 08/21/2019   LDLCALC 50 08/21/2019   TRIG 187.0 (H) 08/21/2019   CHOLHDL 3 08/21/2019    Thyroid nodule: This has been stable and small with previous evaluations  Had a follow-up up  ultrasound in December 2017 done by his PCP, this was stable He also has a 12 mm probably pseudo-nodule present  He has regular foot exams with the podiatrist     Examination:   BP (!) 148/70 (BP Location: Left Arm, Patient Position: Sitting, Cuff Size: Normal)   Pulse (!) 50   Ht 5' 4"$  (1.626 m)   SpO2 90%   BMI 28.29 kg/m   Body mass index is 28.29 kg/m.  .    ASSESSMENT/ PLAN:   Diabetes type 2 with mild obesity   See history of present illness for detailed discussion of current diabetes management, blood sugar patterns and problems identified  A1c is 6.2 and much better  Currently only on Prandin Likely with weight loss as well as improved diet overall and smaller portions he has not better controlled despite taking only mealtime Prandin Fasting blood sugars appear to be fairly good at home  No hypoglycemia also Generally not able to afford brand-name medications For now we will continue his same regimen  Follow-up in 4 months  There are no Patient Instructions on file for this visit.    Elayne Snare 04/15/2022, 10:04 AM

## 2022-04-30 ENCOUNTER — Other Ambulatory Visit: Payer: Self-pay | Admitting: Cardiology

## 2022-04-30 DIAGNOSIS — I480 Paroxysmal atrial fibrillation: Secondary | ICD-10-CM

## 2022-04-30 NOTE — Telephone Encounter (Signed)
Dose decreased to 2.'5mg'$  BID per Karren Cobble, PharmD.   Called pt and made him aware of dose change. Pt verbalized understanding.   Refill sent.

## 2022-04-30 NOTE — Telephone Encounter (Signed)
Prescription refill request for Eliquis received. Indication: Afib  Last office visit: 11/14/21 Marlou Porch)  Scr: 1.65 (04/09/22)  Age: 89 Weight: 74.5kg  Per dosing criteria, pt's current dose is not appropriate.   Will forward to pharmacy team to review.

## 2022-05-13 ENCOUNTER — Ambulatory Visit: Payer: Medicare Other | Admitting: Podiatry

## 2022-06-09 ENCOUNTER — Ambulatory Visit (INDEPENDENT_AMBULATORY_CARE_PROVIDER_SITE_OTHER): Payer: Medicare Other | Admitting: Podiatry

## 2022-06-09 ENCOUNTER — Encounter: Payer: Self-pay | Admitting: Podiatry

## 2022-06-09 DIAGNOSIS — B351 Tinea unguium: Secondary | ICD-10-CM | POA: Diagnosis not present

## 2022-06-09 DIAGNOSIS — M79674 Pain in right toe(s): Secondary | ICD-10-CM | POA: Diagnosis not present

## 2022-06-09 DIAGNOSIS — M79675 Pain in left toe(s): Secondary | ICD-10-CM | POA: Diagnosis not present

## 2022-06-09 DIAGNOSIS — E1121 Type 2 diabetes mellitus with diabetic nephropathy: Secondary | ICD-10-CM | POA: Diagnosis not present

## 2022-06-09 NOTE — Progress Notes (Signed)
This patient returns to my office for at risk foot care.  This patient requires this care by a professional since this patient will be at risk due to having diabetes.  This patient is unable to cut nails himself since the patient cannot reach his nails.These nails are painful walking and wearing shoes.  This patient presents for at risk foot care today.  General Appearance  Alert, conversant and in no acute stress.  Vascular  Dorsalis pedis and posterior tibial  pulses are weakly  palpable  bilaterally.  Capillary return is within normal limits  bilaterally. Temperature is within normal limits  bilaterally.  Neurologic  Senn-Weinstein monofilament wire test within normal limits  bilaterally. Muscle power within normal limits bilaterally.  Nails Thick disfigured discolored nails with subungual debris  from hallux to fifth toes bilaterally. No evidence of bacterial infection or drainage bilaterally.  Orthopedic  No limitations of motion  feet .  No crepitus or effusions noted.  No bony pathology or digital deformities noted. HAV  B/L.  Skin  normotropic skin with no porokeratosis noted bilaterally.  No signs of infections or ulcers noted.     Onychomycosis  Pain in right toes  Pain in left toes  Consent was obtained for treatment procedures.   Mechanical debridement of nails 1-5  bilaterally performed with a nail nipper.  Filed with dremel without incident.    Return office visit  3 months                   Told patient to return for periodic foot care and evaluation due to potential at risk complications.   Ercelle Winkles DPM  

## 2022-07-09 NOTE — Progress Notes (Deleted)
Cardiology Clinic Note   Patient Name: Cody Thomas Date of Encounter: 07/09/2022  Primary Care Provider:  Gweneth Dimitri, MD Primary Cardiologist:  Donato Schultz, MD  Patient Profile    89 year old male with known history of coronary artery disease, history of CABG in 2013, permanent atrial fibrillation, hyperlipidemia, Type II diabetes followed by Endocrinology, periods of dizziness..  Event monitor 05/26/2019 revealed multiple episodes of atrial flutter with variable conduction and multiple episodes of nonsustained ventricular tachycardia longest lasting 9 seconds along with a pause greater than 3 seconds.  He was evaluated by EP and was not found to need a pacemaker at that time.  Most recent echocardiogram January 2021 revealed normal LV systolic function EF of 60 to 16%, his son, is an EMT and accompanies him to his appointments.  Was last seen by Dr. Anne Fu on 11/14/2021 with no medication changes at that time or new testing  Past Medical History    Past Medical History:  Diagnosis Date   Anxiety    Bunion 09/29/2011   Cancer [facility])    nose/Dr. Albertini;skin   Chronic constipation    Chronic kidney disease    stage 111   Coronary atherosclerosis of native coronary artery    2009 LAD CIRC DES   Depression    doesn't take any meds for this   Diabetes mellitus    takes Januvia daily   Dizziness    Enlarged prostate    but doesn't require meds at present   GERD (gastroesophageal reflux disease)    TUMS prn   H/O hiatal hernia    Headache(784.0)    History of gout    doesn't require meds   Hx of cardiac cath    Hyperlipidemia    Crestor 3 x wk and Zetia daily   Hypertension    takes Hyzaar daily   Insomnia    doesn't require meds at present time   Joint pain    Onychomycosis 10/05/2012   x 10   PONV (postoperative nausea and vomiting)    pt states extremely sick   Shortness of breath    with exertion   Past Surgical History:  Procedure Laterality Date    bladder cancer     CARDIAC CATHETERIZATION  2013   cataracts removed     CHOLECYSTECTOMY     COLONOSCOPY     CORONARY ANGIOPLASTY     2 stents from 2009   CORONARY ARTERY BYPASS GRAFT  10/29/2011   Procedure: CORONARY ARTERY BYPASS GRAFTING (CABG);  Surgeon: Kerin Perna, MD;  Location: [facility] OR;  Service: Open Heart Surgery;  Laterality: N/A;   cyst removed from finger     right hand   EYE SURGERY     bilateral cataracts   HEMORRHOID SURGERY     HERNIA REPAIR     x 2;inguinal   KIDNEY STONE SURGERY     LEFT HEART CATHETERIZATION WITH CORONARY ANGIOGRAM N/A 10/21/2011   Procedure: LEFT HEART CATHETERIZATION WITH CORONARY ANGIOGRAM;  Surgeon: Donato Schultz, MD;  Location: [facility] CATH LAB;  Service: Cardiovascular;  Laterality: N/A;   rotator cuff surgery     right    skin cancer removed      Allergies  Allergies  Allergen Reactions   Rosuvastatin     Nightmares in high dosage Other reaction(s): nightmares when taken in LARGE  doses Other reaction(s): nightmares when taken in LARGE  doses Other reaction(s): nightmares when taken in LARGE  doses   Sertraline Diarrhea  Other reaction(s): diarrhea   Simvastatin     Nightmares in high dosage Other reaction(s): nightmares when taken in LARGE  doses Other reaction(s): nightmares when taken in LARGE  doses Other reaction(s): nightmares when taken in LARGE  doses    History of Present Illness    ***  Home Medications    Current Outpatient Medications  Medication Sig Dispense Refill   acetaminophen (TYLENOL) 500 MG tablet Take by mouth.     apixaban (ELIQUIS) 2.5 MG TABS tablet Take 1 tablet (2.5 mg total) by mouth 2 (two) times daily. 180 tablet 0   azelastine (ASTELIN) 0.1 % nasal spray Place 1 spray into both nostrils 2 (two) times daily.     B Complex Vitamins (VITAMIN B COMPLEX) TABS See admin instructions.     Blood Glucose Monitoring Suppl (BAYER CONTOUR LINK 2.4) w/Device KIT Check sugar once daily. 1 kit 0   calcium  carbonate (OS-CAL) 1250 (500 Ca) MG chewable tablet Chew by mouth.     calcium carbonate (TUMS - DOSED IN MG ELEMENTAL CALCIUM) 500 MG chewable tablet 2 tablets as needed for indegestion     Coenzyme Q10-Vitamin E 100-1 MG-UNT/5ML SYRP Take 100 mg by mouth daily. Take 2 tablespoons (100MG ) Daily     escitalopram (LEXAPRO) 5 MG tablet 0.5 tablets     fluorouracil (EFUDEX) 5 % cream Apply topically 2 (two) times daily.     fluticasone (FLONASE) 50 MCG/ACT nasal spray Place 1 spray into both nostrils daily as needed for allergies or rhinitis.     fluticasone (FLONASE) 50 MCG/ACT nasal spray Place into the nose.     glucose blood test strip Use as instructed check sugar once daily. 100 each 12   ketoconazole (NIZORAL) 2 % cream Apply topically.     losartan-hydrochlorothiazide (HYZAAR) 100-25 MG tablet TAKE 1 TABLET BY MOUTH DAILY 90 tablet 3   metoprolol tartrate (LOPRESSOR) 25 MG tablet TAKE 1 TABLET(25 MG) BY MOUTH TWICE DAILY 180 tablet 3   Omega-3 Fatty Acids (FISH OIL) 1200 MG CPDR 2 capsules     ondansetron (ZOFRAN) 4 MG tablet Take by mouth.     repaglinide (PRANDIN) 0.5 MG tablet TAKE 1 TABLET BY MOUTH EVERY MORNING BEFORE BREAKFAST AND BEFORE DINNER 60 tablet 3   rosuvastatin (CRESTOR) 20 MG tablet Take 20 mg by mouth See admin instructions. Take 1 tablet by mouth three times weekly.     traMADol (ULTRAM) 50 MG tablet TAKE 1 TABLET BY MOUTH EVERY 6 HOURS AS NEEDED 30 tablet 0   traMADol (ULTRAM) 50 MG tablet      traZODone (DESYREL) 50 MG tablet Take 50 mg by mouth at bedtime as needed.     triamcinolone (KENALOG) 0.025 % cream Apply topically.     Zinc 100 MG TABS 1 tablet     No current facility-administered medications for this visit.     Family History    Family History  Problem Relation Age of Onset   Heart disease Mother    Stroke Father    Cancer Brother    He indicated that his mother is deceased. He indicated that his father is deceased. He indicated that his brother is  deceased. He indicated that his maternal grandmother is deceased. He indicated that his maternal grandfather is deceased. He indicated that his paternal grandmother is deceased. He indicated that his paternal grandfather is deceased.  Social History    Social History   Socioeconomic History   Marital status: Married  Spouse name: Not on file   Number of children: 2   Years of education: HS   Highest education level: Not on file  Occupational History   Occupation: Retired  Tobacco Use   Smoking status: Former    Packs/day: 1.50    Years: 30.00    Additional pack years: 0.00    Total pack years: 45.00    Types: Cigarettes    Quit date: 03/02/1984    Years since quitting: 38.3   Smokeless tobacco: Never  Vaping Use   Vaping Use: Never used  Substance and Sexual Activity   Alcohol use: No    Alcohol/week: 0.0 standard drinks of alcohol   Drug use: No   Sexual activity: Not Currently  Other Topics Concern   Not on file  Social History Narrative   Lives at home with his wife and great-granddaughter.   Right-handed.   Occasional caffeine use.   Social Determinants of Health   Financial Resource Strain: Not on file  Food Insecurity: Not on file  Transportation Needs: Not on file  Physical Activity: Not on file  Stress: Not on file  Social Connections: Not on file  Intimate Partner Violence: Not on file     Review of Systems    General:  No chills, fever, night sweats or weight changes.  Cardiovascular:  No chest pain, dyspnea on exertion, edema, orthopnea, palpitations, paroxysmal nocturnal dyspnea. Dermatological: No rash, lesions/masses Respiratory: No cough, dyspnea Urologic: No hematuria, dysuria Abdominal:   No nausea, vomiting, diarrhea, bright red blood per rectum, melena, or hematemesis Neurologic:  No visual changes, wkns, changes in mental status. All other systems reviewed and are otherwise negative except as noted above.     Physical Exam    VS:   There were no vitals taken for this visit. , BMI There is no height or weight on file to calculate BMI.     GEN: Well nourished, well developed, in no acute distress. HEENT: normal. Neck: Supple, no JVD, carotid bruits, or masses. Cardiac: RRR, no murmurs, rubs, or gallops. No clubbing, cyanosis, edema.  Radials/DP/PT 2+ and equal bilaterally.  Respiratory:  Respirations regular and unlabored, clear to auscultation bilaterally. GI: Soft, nontender, nondistended, BS + x 4. MS: no deformity or atrophy. Skin: warm and dry, no rash. Neuro:  Strength and sensation are intact. Psych: Normal affect.  Accessory Clinical Findings    ECG personally reviewed by me today- *** - No acute changes  Lab Results  Component Value Date   WBC 7.1 03/20/2019   HGB 15.2 03/20/2019   HCT 43.9 03/20/2019   MCV 93.0 03/20/2019   PLT 169 03/20/2019   Lab Results  Component Value Date   CREATININE 1.65 (H) 04/09/2022   BUN 21 04/09/2022   NA 138 04/09/2022   K 4.1 04/09/2022   CL 100 04/09/2022   CO2 28 04/09/2022   Lab Results  Component Value Date   ALT 20 09/24/2020   AST 21 09/24/2020   ALKPHOS 35 (L) 09/24/2020   BILITOT 2.3 (H) 09/24/2020   Lab Results  Component Value Date   CHOL 126 08/21/2019   HDL 38.40 (L) 08/21/2019   LDLCALC 50 08/21/2019   TRIG 187.0 (H) 08/21/2019   CHOLHDL 3 08/21/2019    Lab Results  Component Value Date   HGBA1C 6.2 04/09/2022    Review of Prior Studies: Cardiac Monitor 05/2019: Multiple episodes atrial flutter with variable conduction Multiple episodes of non sustained ventricular tachycardia (longest 9 seconds) Pause  greater than 3 seconds (during conversion of atrial flutter to NSR)   Please refer to EP for evaluation of near syncope in the context of findings from monitor. He is on Eliquis.   Bilateral Carotid Duplex Doppler 03/20/2019: Summary:  Right Carotid: Velocities in the right ICA are consistent with a 1-39%  stenosis.                  No significant change since study done 10/2011.   Left Carotid: Velocities in the left ICA are consistent with a 1-39%  stenosis.                No significant change since study done 10/2011.   Vertebrals: Bilateral vertebral arteries demonstrate antegrade flow.     Echo 03/19/2019: 1. Left ventricular ejection fraction, by visual estimation, is 60 to  65%. The left ventricle has normal function. Left ventricular septal wall  thickness was mildly increased. There is mildly increased left ventricular  hypertrophy.   2. The left ventricle has no regional wall motion abnormalities.   3. Global right ventricle has mildly reduced systolic function.The right  ventricular size is mildly enlarged. No increase in right ventricular wall  thickness.   4. Left atrial size was normal.   5. Right atrial size was normal.   6. Mild mitral annular calcification.   7. Moderate calcification of the mitral valve leaflet(s).   8. Moderate thickening of the mitral valve leaflet(s).   9. The mitral valve is degenerative. Mild mitral valve regurgitation. No  evidence of mitral stenosis.  10. The tricuspid valve is normal in structure.  11. The aortic valve is tricuspid. Aortic valve regurgitation is trivial.  Mild aortic valve sclerosis without stenosis.  12. Pulmonic regurgitation is mild.  13. The pulmonic valve was not well visualized. Pulmonic valve  regurgitation is mild.  14. Normal pulmonary artery systolic pressure.  15. The inferior vena cava is normal in size with greater than 50%  respiratory variability, suggesting right atrial pressure of 3 mmHg.     CT Head 03/19/2019: FINDINGS: Brain: Mild atrophy and white matter changes progressed over the last 10 years. Basal ganglia are intact. No acute or focal cortical abnormalities are present. No acute infarct, hemorrhage, or mass lesion is present.   No significant extraaxial fluid collection is present. The ventricles are of normal size. The  brainstem and cerebellum are within normal limits.   Vascular: Extensive vascular calcifications are present. No hyperdense vessel is present.   Skull: Insert normal skull No significant extracranial soft tissue lesion is present.   Sinuses/Orbits: Mild mucosal thickening is present in the ethmoid air cells. The paranasal sinuses and mastoid air cells are otherwise clear. The globes and orbits are within normal limits.   IMPRESSION: 1. Progressive atrophy and white matter disease. This likely reflects the sequela of chronic microvascular ischemia. 2. No acute intracranial abnormality.   Assessment & Plan   1.  ***     {Are you ordering a CV Procedure (e.g. stress test, cath, DCCV, TEE, etc)?   Press F2        :[id number]}   Signed, Bettey Mare. Liborio Nixon, ANP, AACC   07/09/2022 12:58 PM      Office [phone] Fax [phone]  Notice: This dictation was prepared with Dragon dictation along with smaller phrase technology. Any transcriptional errors that result from this process are unintentional and may not be corrected upon review.

## 2022-07-10 ENCOUNTER — Other Ambulatory Visit: Payer: Self-pay

## 2022-07-10 ENCOUNTER — Inpatient Hospital Stay (HOSPITAL_COMMUNITY)
Admission: EM | Admit: 2022-07-10 | Discharge: 2022-07-14 | DRG: 291 | Disposition: A | Discharge status: Home Health | Payer: Medicare Other | Attending: Internal Medicine | Admitting: Internal Medicine

## 2022-07-10 ENCOUNTER — Ambulatory Visit: Payer: Medicare Other | Admitting: Adult Health

## 2022-07-10 ENCOUNTER — Observation Stay (HOSPITAL_COMMUNITY): Payer: Medicare Other

## 2022-07-10 ENCOUNTER — Encounter (HOSPITAL_COMMUNITY): Payer: Self-pay

## 2022-07-10 ENCOUNTER — Emergency Department (HOSPITAL_COMMUNITY): Payer: Medicare Other

## 2022-07-10 DIAGNOSIS — I340 Nonrheumatic mitral (valve) insufficiency: Secondary | ICD-10-CM | POA: Diagnosis present

## 2022-07-10 DIAGNOSIS — Z823 Family history of stroke: Secondary | ICD-10-CM

## 2022-07-10 DIAGNOSIS — Z955 Presence of coronary angioplasty implant and graft: Secondary | ICD-10-CM

## 2022-07-10 DIAGNOSIS — R001 Bradycardia, unspecified: Secondary | ICD-10-CM | POA: Diagnosis present

## 2022-07-10 DIAGNOSIS — D696 Thrombocytopenia, unspecified: Secondary | ICD-10-CM | POA: Diagnosis present

## 2022-07-10 DIAGNOSIS — Z951 Presence of aortocoronary bypass graft: Secondary | ICD-10-CM

## 2022-07-10 DIAGNOSIS — Z85828 Personal history of other malignant neoplasm of skin: Secondary | ICD-10-CM

## 2022-07-10 DIAGNOSIS — Z87442 Personal history of urinary calculi: Secondary | ICD-10-CM

## 2022-07-10 DIAGNOSIS — I4821 Permanent atrial fibrillation: Secondary | ICD-10-CM | POA: Diagnosis present

## 2022-07-10 DIAGNOSIS — E1122 Type 2 diabetes mellitus with diabetic chronic kidney disease: Secondary | ICD-10-CM | POA: Diagnosis present

## 2022-07-10 DIAGNOSIS — I5031 Acute diastolic (congestive) heart failure: Secondary | ICD-10-CM

## 2022-07-10 DIAGNOSIS — K5909 Other constipation: Secondary | ICD-10-CM | POA: Diagnosis present

## 2022-07-10 DIAGNOSIS — E876 Hypokalemia: Secondary | ICD-10-CM | POA: Diagnosis present

## 2022-07-10 DIAGNOSIS — I5033 Acute on chronic diastolic (congestive) heart failure: Secondary | ICD-10-CM

## 2022-07-10 DIAGNOSIS — Z87891 Personal history of nicotine dependence: Secondary | ICD-10-CM

## 2022-07-10 DIAGNOSIS — I10 Essential (primary) hypertension: Secondary | ICD-10-CM | POA: Diagnosis not present

## 2022-07-10 DIAGNOSIS — I130 Hypertensive heart and chronic kidney disease with heart failure and stage 1 through stage 4 chronic kidney disease, or unspecified chronic kidney disease: Secondary | ICD-10-CM | POA: Diagnosis not present

## 2022-07-10 DIAGNOSIS — E785 Hyperlipidemia, unspecified: Secondary | ICD-10-CM | POA: Diagnosis present

## 2022-07-10 DIAGNOSIS — G4733 Obstructive sleep apnea (adult) (pediatric): Secondary | ICD-10-CM | POA: Diagnosis present

## 2022-07-10 DIAGNOSIS — R0600 Dyspnea, unspecified: Principal | ICD-10-CM

## 2022-07-10 DIAGNOSIS — N184 Chronic kidney disease, stage 4 (severe): Secondary | ICD-10-CM | POA: Diagnosis present

## 2022-07-10 DIAGNOSIS — I2510 Atherosclerotic heart disease of native coronary artery without angina pectoris: Secondary | ICD-10-CM | POA: Diagnosis present

## 2022-07-10 DIAGNOSIS — Z8551 Personal history of malignant neoplasm of bladder: Secondary | ICD-10-CM

## 2022-07-10 DIAGNOSIS — Z9049 Acquired absence of other specified parts of digestive tract: Secondary | ICD-10-CM

## 2022-07-10 DIAGNOSIS — Z66 Do not resuscitate: Secondary | ICD-10-CM | POA: Diagnosis present

## 2022-07-10 DIAGNOSIS — Z888 Allergy status to other drugs, medicaments and biological substances status: Secondary | ICD-10-CM

## 2022-07-10 DIAGNOSIS — Z79899 Other long term (current) drug therapy: Secondary | ICD-10-CM

## 2022-07-10 DIAGNOSIS — I272 Pulmonary hypertension, unspecified: Secondary | ICD-10-CM | POA: Diagnosis present

## 2022-07-10 DIAGNOSIS — Z7901 Long term (current) use of anticoagulants: Secondary | ICD-10-CM

## 2022-07-10 DIAGNOSIS — I5043 Acute on chronic combined systolic (congestive) and diastolic (congestive) heart failure: Secondary | ICD-10-CM | POA: Diagnosis present

## 2022-07-10 DIAGNOSIS — Z8249 Family history of ischemic heart disease and other diseases of the circulatory system: Secondary | ICD-10-CM

## 2022-07-10 DIAGNOSIS — M109 Gout, unspecified: Secondary | ICD-10-CM | POA: Diagnosis present

## 2022-07-10 DIAGNOSIS — Z8701 Personal history of pneumonia (recurrent): Secondary | ICD-10-CM

## 2022-07-10 DIAGNOSIS — R7989 Other specified abnormal findings of blood chemistry: Secondary | ICD-10-CM | POA: Diagnosis present

## 2022-07-10 DIAGNOSIS — N400 Enlarged prostate without lower urinary tract symptoms: Secondary | ICD-10-CM | POA: Diagnosis present

## 2022-07-10 DIAGNOSIS — H9190 Unspecified hearing loss, unspecified ear: Secondary | ICD-10-CM | POA: Diagnosis present

## 2022-07-10 DIAGNOSIS — Z8616 Personal history of COVID-19: Secondary | ICD-10-CM

## 2022-07-10 DIAGNOSIS — K219 Gastro-esophageal reflux disease without esophagitis: Secondary | ICD-10-CM | POA: Diagnosis present

## 2022-07-10 DIAGNOSIS — J9601 Acute respiratory failure with hypoxia: Secondary | ICD-10-CM | POA: Diagnosis present

## 2022-07-10 DIAGNOSIS — R0602 Shortness of breath: Secondary | ICD-10-CM | POA: Diagnosis not present

## 2022-07-10 DIAGNOSIS — I5022 Chronic systolic (congestive) heart failure: Secondary | ICD-10-CM

## 2022-07-10 DIAGNOSIS — I509 Heart failure, unspecified: Secondary | ICD-10-CM

## 2022-07-10 DIAGNOSIS — N179 Acute kidney failure, unspecified: Secondary | ICD-10-CM | POA: Diagnosis present

## 2022-07-10 LAB — PROCALCITONIN: Procalcitonin: <0.1 ng/mL

## 2022-07-10 LAB — BASIC METABOLIC PANEL
Anion gap: 12 (ref 5–15)
BUN: 28 mg/dL — ABNORMAL HIGH (ref 8–23)
CO2: 23 mmol/L (ref 22–32)
Calcium: 9.1 mg/dL (ref 8.9–10.3)
Chloride: 101 mmol/L (ref 98–111)
Creatinine, Ser: 2.02 mg/dL — ABNORMAL HIGH (ref 0.61–1.24)
GFR, Estimated: 31 mL/min — ABNORMAL LOW (ref >60)
Glucose, Bld: 142 mg/dL — ABNORMAL HIGH (ref 70–99)
Potassium: 3.5 mmol/L (ref 3.5–5.1)
Sodium: 136 mmol/L (ref 135–145)

## 2022-07-10 LAB — BRAIN NATRIURETIC PEPTIDE: B Natriuretic Peptide: 3339.2 pg/mL — ABNORMAL HIGH (ref 0.0–100.0)

## 2022-07-10 LAB — LACTIC ACID, PLASMA
Lactic Acid, Venous: 1.9 mmol/L (ref 0.5–1.9)
Lactic Acid, Venous: 1.5 mmol/L (ref 0.5–1.9)

## 2022-07-10 LAB — ECHOCARDIOGRAM COMPLETE
Height: 64 in
S' Lateral: 2.9 cm
Weight: 2529.12 oz

## 2022-07-10 LAB — CBC
HCT: 47.8 % (ref 39.0–52.0)
Hemoglobin: 16.3 g/dL (ref 13.0–17.0)
MCH: 34.1 pg — ABNORMAL HIGH (ref 26.0–34.0)
MCHC: 34.1 g/dL (ref 30.0–36.0)
MCV: 100 fL (ref 80.0–100.0)
Platelets: 108 10*3/uL — ABNORMAL LOW (ref 150–400)
RBC: 4.78 MIL/uL (ref 4.22–5.81)
RDW: 13.7 % (ref 11.5–15.5)
WBC: 7.1 10*3/uL (ref 4.0–10.5)
nRBC: 0 % (ref 0.0–0.2)

## 2022-07-10 LAB — TROPONIN I (HIGH SENSITIVITY)
Troponin I (High Sensitivity): 33 ng/L — ABNORMAL HIGH (ref <18)
Troponin I (High Sensitivity): 32 ng/L — ABNORMAL HIGH (ref <18)

## 2022-07-10 MED ORDER — FUROSEMIDE 10 MG/ML IJ SOLN
40.0000 mg | Freq: Once | INTRAMUSCULAR | Status: AC
  Administered 2022-07-10: 40 mg via INTRAVENOUS
  Filled 2022-07-10: qty 4

## 2022-07-10 MED ORDER — TRAZODONE HCL 50 MG PO TABS
50.0000 mg | ORAL_TABLET | Freq: Every day | ORAL | Status: DC
  Administered 2022-07-10 – 2022-07-13 (×4): 50 mg via ORAL
  Filled 2022-07-10 (×4): qty 1

## 2022-07-10 MED ORDER — APIXABAN 2.5 MG PO TABS
2.5000 mg | ORAL_TABLET | Freq: Two times a day (BID) | ORAL | Status: DC
  Administered 2022-07-10 – 2022-07-14 (×8): 2.5 mg via ORAL
  Filled 2022-07-10 (×8): qty 1

## 2022-07-10 MED ORDER — IPRATROPIUM-ALBUTEROL 0.5-2.5 (3) MG/3ML IN SOLN
3.0000 mL | Freq: Four times a day (QID) | RESPIRATORY_TRACT | Status: DC
  Administered 2022-07-10: 3 mL via RESPIRATORY_TRACT
  Filled 2022-07-10 (×2): qty 3

## 2022-07-10 MED ORDER — FUROSEMIDE 10 MG/ML IJ SOLN
40.0000 mg | Freq: Two times a day (BID) | INTRAMUSCULAR | Status: DC
  Administered 2022-07-10 – 2022-07-12 (×4): 40 mg via INTRAVENOUS
  Filled 2022-07-10 (×6): qty 4

## 2022-07-10 MED ORDER — ONDANSETRON HCL 4 MG/2ML IJ SOLN: 4.0000 mg | Freq: Four times a day (QID) | INTRAMUSCULAR | Status: DC | PRN

## 2022-07-10 MED ORDER — ONDANSETRON HCL 4 MG PO TABS: 4.0000 mg | ORAL_TABLET | Freq: Four times a day (QID) | ORAL | Status: DC | PRN

## 2022-07-10 MED ORDER — ALBUTEROL SULFATE (2.5 MG/3ML) 0.083% IN NEBU
2.5000 mg | INHALATION_SOLUTION | RESPIRATORY_TRACT | Status: DC | PRN
  Administered 2022-07-12: 2.5 mg via RESPIRATORY_TRACT

## 2022-07-10 MED ORDER — HYDRALAZINE HCL 25 MG PO TABS
25.0000 mg | ORAL_TABLET | Freq: Three times a day (TID) | ORAL | Status: DC
  Administered 2022-07-10 – 2022-07-13 (×7): 25 mg via ORAL
  Filled 2022-07-10 (×10): qty 1

## 2022-07-10 MED ORDER — ACETAMINOPHEN 650 MG RE SUPP: 650.0000 mg | Freq: Four times a day (QID) | RECTAL | Status: DC | PRN

## 2022-07-10 MED ORDER — METOPROLOL TARTRATE 25 MG PO TABS: 25.0000 mg | ORAL_TABLET | Freq: Two times a day (BID) | ORAL | Status: DC

## 2022-07-10 MED ORDER — ACETAMINOPHEN 325 MG PO TABS: 650.0000 mg | ORAL_TABLET | Freq: Four times a day (QID) | ORAL | Status: DC | PRN

## 2022-07-10 MED ORDER — GUAIFENESIN ER 600 MG PO TB12
600.0000 mg | ORAL_TABLET | Freq: Two times a day (BID) | ORAL | Status: DC
  Administered 2022-07-10 – 2022-07-14 (×9): 600 mg via ORAL
  Filled 2022-07-10 (×9): qty 1

## 2022-07-10 MED ORDER — ESCITALOPRAM OXALATE 5 MG PO TABS
2.5000 mg | ORAL_TABLET | Freq: Every evening | ORAL | Status: DC
  Filled 2022-07-10: qty 1

## 2022-07-10 NOTE — Consult Note (Signed)
Cardiology Consultation:   Patient ID: Cody Thomas MRN: [id number]; DOB: 06-06-1930   Admission date: 07/10/2022  PCP:  Cody Dimitri, MD   [facility] Providers Cardiologist:  Cody Schultz, MD   Chief Complaint:  CHF  Patient Profile:   Cody Thomas is a 89 y.o. male with CAD s/p CABG, permanent Afib/flutter on beta-blocker and Eliquis, hypertension, hyperlipidemia, DM, CKD 3, OSA on CPAP who is being seen 07/10/2022 for the evaluation of CHF at the request for Dr. Hilliard Clark.  History of Present Illness:   Mr. Cody Thomas has a history of CAD with prior CABG in 2013.  He has atrial fibrillation now felt permanent. Coumadin was switched to eliquis. He had a near syncopal event while sitting in a chair. A heart monitor in 2021 showing episodes of atrial flutter with variable conduction and multiple episodes of NSVT with the longest being 9 seconds.  He also had a pause of greater than 3 seconds but the pause was during conversion of atrial flutter to normal sinus rhythm.  He was evaluated by EP for near syncope and declined loop recorder. He has no pacemaker needs. Echocardiogram in 2021 showed normal LVEF, mild MAC, mild MR, and mild pulmonic regurgitation.  He is anticoagulated with Eliquis 2.5 mg twice daily.  Hypertension is controlled with losartan-HCTZ, Lopressor 25 mg twice daily.  He is maintained on 20 mg Crestor.     Unfortunately his wife passed away 11/04/2022after 60 years of marriage.  He was seen by Dr. Anne Fu 11/14/2021.   He presented to [facility] with 2 day history of cough and shortness of breath. He was treated for PNA with PCP, but was later informed his BNP was n the 5000. Pt was exhibiting some new confusion per family and he was directed to the ER. On arrival, he was hypoxic at 85% corrected with 3L [place]. He also had chest pain yesterday that has resolved. He also had lower extremity edema and denied current chest pain in triage. Daughter at bedside has  helped with history.   BP 161/110, HR 60, RR 23 On 2L [place] sCr 2.02 - baseline around 1.4-1.6 BNP 3340 HS troponin 33 --> 32 Lactic acid 1.9 Telemetry with atrial fibrillation in the 50s with PVCs  Cardiology consulted for suspected new onset heart failure.  During my exam, daughter is bedside and helps with history. He developed lower extremity edema about 1 months ago and developed shortness of breath with exertion about 2 weeks ago. He denies orthopnea but hasn't slept well in the last 2 years since his wife died. He remains active, riding a mower, trimming shrubs at church. However, he eats fast food every day including bogangles and steak houses. He doesn't cook at home. He denies rapid heart rate recently, recent illness, and chest pain.   Has received 40 mg IV lasix with good output, per the patient. He is already felling better, but remains extremely volume up.    Past Medical History:  Diagnosis Date   Anxiety    Bunion 09/29/2011   Cancer [facility])    nose/Dr. Albertini;skin   Chronic constipation    Chronic kidney disease    stage 111   Coronary atherosclerosis of native coronary artery    2009 LAD CIRC DES   Depression    doesn't take any meds for this   Diabetes mellitus    takes Januvia daily   Dizziness    Enlarged prostate    but doesn't require  meds at present   GERD (gastroesophageal reflux disease)    TUMS prn   H/O hiatal hernia    Headache(784.0)    History of gout    doesn't require meds   Hx of cardiac cath    Hyperlipidemia    Crestor 3 x wk and Zetia daily   Hypertension    takes Hyzaar daily   Insomnia    doesn't require meds at present time   Joint pain    Onychomycosis 10/05/2012   x 10   PONV (postoperative nausea and vomiting)    pt states extremely sick   Shortness of breath    with exertion    Past Surgical History:  Procedure Laterality Date   bladder cancer     CARDIAC CATHETERIZATION  2013   cataracts removed     CHOLECYSTECTOMY      COLONOSCOPY     CORONARY ANGIOPLASTY     2 stents from 2009   CORONARY ARTERY BYPASS GRAFT  10/29/2011   Procedure: CORONARY ARTERY BYPASS GRAFTING (CABG);  Surgeon: Kerin Perna, MD;  Location: [facility] OR;  Service: Open Heart Surgery;  Laterality: N/A;   cyst removed from finger     right hand   EYE SURGERY     bilateral cataracts   HEMORRHOID SURGERY     HERNIA REPAIR     x 2;inguinal   KIDNEY STONE SURGERY     LEFT HEART CATHETERIZATION WITH CORONARY ANGIOGRAM N/A 10/21/2011   Procedure: LEFT HEART CATHETERIZATION WITH CORONARY ANGIOGRAM;  Surgeon: Cody Schultz, MD;  Location: [facility] CATH LAB;  Service: Cardiovascular;  Laterality: N/A;   rotator cuff surgery     right    skin cancer removed       Medications Prior to Admission: Prior to Admission medications   Medication Sig Start Date End Date Taking? Authorizing Provider  acetaminophen (TYLENOL) 500 MG tablet Take by mouth.    [provider]  apixaban (ELIQUIS) 2.5 MG TABS tablet Take 1 tablet (2.5 mg total) by mouth 2 (two) times daily. 04/30/22   Jake Bathe, MD  azelastine (ASTELIN) 0.1 % nasal spray Place 1 spray into both nostrils 2 (two) times daily. 02/26/21   [provider]  B Complex Vitamins (VITAMIN B COMPLEX) TABS See admin instructions.    [provider]  Blood Glucose Monitoring Suppl (BAYER CONTOUR LINK 2.4) w/Device KIT Check sugar once daily. 05/06/20   Reather Littler, MD  calcium carbonate (OS-CAL) 1250 (500 Ca) MG chewable tablet Chew by mouth.    [provider]  calcium carbonate (TUMS - DOSED IN MG ELEMENTAL CALCIUM) 500 MG chewable tablet 2 tablets as needed for indegestion    [provider]  Coenzyme Q10-Vitamin E 100-1 MG-UNT/5ML SYRP Take 100 mg by mouth daily. Take 2 tablespoons (100MG ) Daily    [provider]  escitalopram (LEXAPRO) 5 MG tablet 0.5 tablets 04/17/21   [provider]  fluorouracil (EFUDEX) 5 % cream Apply topically 2 (two)  times daily. 01/02/22   [provider]  fluticasone (FLONASE) 50 MCG/ACT nasal spray Place 1 spray into both nostrils daily as needed for allergies or rhinitis.    [provider]  fluticasone (FLONASE) 50 MCG/ACT nasal spray Place into the nose.    [provider]  glucose blood test strip Use as instructed check sugar once daily. 05/06/20   Reather Littler, MD  ketoconazole (NIZORAL) 2 % cream Apply topically. 01/02/22   [provider]  losartan-hydrochlorothiazide (  HYZAAR) 100-25 MG tablet TAKE 1 TABLET BY MOUTH DAILY 11/21/21   Jake Bathe, MD  metoprolol tartrate (LOPRESSOR) 25 MG tablet TAKE 1 TABLET(25 MG) BY MOUTH TWICE DAILY 11/14/21   Jake Bathe, MD  Omega-3 Fatty Acids (FISH OIL) 1200 MG CPDR 2 capsules    [provider]  ondansetron (ZOFRAN) 4 MG tablet Take by mouth.    [provider]  repaglinide (PRANDIN) 0.5 MG tablet TAKE 1 TABLET BY MOUTH EVERY MORNING BEFORE BREAKFAST AND BEFORE DINNER 09/12/21   Reather Littler, MD  rosuvastatin (CRESTOR) 20 MG tablet Take 20 mg by mouth See admin instructions. Take 1 tablet by mouth three times weekly.    [provider]  traMADol (ULTRAM) 50 MG tablet TAKE 1 TABLET BY MOUTH EVERY 6 HOURS AS NEEDED 09/01/17   Kerrin Champagne, MD  traMADol Janean Sark) 50 MG tablet     [provider]  traZODone (DESYREL) 50 MG tablet Take 50 mg by mouth at bedtime as needed. 08/22/20   [provider]  triamcinolone (KENALOG) 0.025 % cream Apply topically. 01/02/22   [provider]  Zinc 100 MG TABS 1 tablet    [provider]     Allergies:    Allergies  Allergen Reactions   Crestor [Rosuvastatin] Other (See Comments)    Nightmares    Zocor [Simvastatin] Other (See Comments)    Nightmares    Zoloft [Sertraline] Diarrhea    Social History:   Social History   Socioeconomic History   Marital status: Married    Spouse name: Not on file   Number of children: 2    Years of education: HS   Highest education level: Not on file  Occupational History   Occupation: Retired  Tobacco Use   Smoking status: Former    Packs/day: 1.50    Years: 30.00    Additional pack years: 0.00    Total pack years: 45.00    Types: Cigarettes    Quit date: 03/02/1984    Years since quitting: 38.3   Smokeless tobacco: Never  Vaping Use   Vaping Use: Never used  Substance and Sexual Activity   Alcohol use: No    Alcohol/week: 0.0 standard drinks of alcohol   Drug use: No   Sexual activity: Not Currently  Other Topics Concern   Not on file  Social History Narrative   Lives at home with his wife and great-granddaughter.   Right-handed.   Occasional caffeine use.   Social Determinants of Health   Financial Resource Strain: Not on file  Food Insecurity: No Food Insecurity (07/10/2022)   Hunger Vital Sign    Worried About Running Out of Food in the Last Year: Never true    Ran Out of Food in the Last Year: Never true  Transportation Needs: No Transportation Needs (07/10/2022)   PRAPARE - Administrator, Civil Service (Medical): No    Lack of Transportation (Non-Medical): No  Physical Activity: Not on file  Stress: Not on file  Social Connections: Not on file  Intimate Partner Violence: Not At Risk (07/10/2022)   Humiliation, Afraid, Rape, and Kick questionnaire    Fear of Current or Ex-Partner: No    Emotionally Abused: No    Physically Abused: No    Sexually Abused: No    Family History:   The patient's family history includes Cancer in his brother; Heart disease in his mother; Stroke in his father.    ROS:  Please  see the history of present illness.  All other ROS reviewed and negative.     Physical Exam/Data:   Vitals:   07/10/22 1430 07/10/22 1500 07/10/22 1536 07/10/22 1636  BP: (!) 174/90 (!) 160/106  (!) 168/108  Pulse: (!) 46 (!) 36  71  Resp: 20 (!) 25  20  Temp:   97.8 F (36.6 C) 97.8 F (36.6 C)  TempSrc:   Oral Oral  SpO2:  97% 97%    Weight:    71.7 kg  Height:    5\' 4"  (1.626 m)   No intake or output data in the 24 hours ending 07/10/22 1721    07/10/2022    4:36 PM 04/15/2022    9:48 AM 11/14/2021    9:20 AM  Last 3 Weights  Weight (lbs) 158 lb 1.1 oz 164 lb 3.2 oz 164 lb 12.8 oz  Weight (kg) 71.7 kg 74.481 kg 74.753 kg     Body mass index is 27.13 kg/m.  General:  elderly male in NAD, HOH HEENT: normal Neck: + JVD Vascular: No carotid bruits; Distal pulses 2+ bilaterally   Cardiac:  irregular rhythm regular rate Lungs:  clear to auscultation bilaterally, no wheezing, rhonchi or rales  Abd: soft, nontender, no hepatomegaly  Ext: 1-2+ B LE edema, edematous to his lower thighs Musculoskeletal:  No deformities, BUE and BLE strength normal and equal Skin: warm and dry  Neuro:  CNs 2-12 intact, no focal abnormalities noted Psych:  Normal affect    EKG:  The ECG that was done  was personally reviewed and demonstrates irregular rhythm without clear p waves, suspect Afib rate controlled at 76, prior anterior infarct  Relevant CV Studies:  Heart monitor 05/2019:  Multiple episodes atrial flutter with variable conduction  Multiple episodes of non sustained ventricular tachycardia (longest 9 seconds)  Pause greater than 3 seconds (during conversion of atrial flutter to NSR)   Please refer to EP for evaluation of near syncope in the context of findings from monitor. He is on Eliquis   Echo 03/2019: 1. Left ventricular ejection fraction, by visual estimation, is 60 to  65%. The left ventricle has normal function. Left ventricular septal wall  thickness was mildly increased. There is mildly increased left ventricular  hypertrophy.   2. The left ventricle has no regional wall motion abnormalities.   3. Global right ventricle has mildly reduced systolic function.The right  ventricular size is mildly enlarged. No increase in right ventricular wall  thickness.   4. Left atrial size was normal.   5.  Right atrial size was normal.   6. Mild mitral annular calcification.   7. Moderate calcification of the mitral valve leaflet(s).   8. Moderate thickening of the mitral valve leaflet(s).   9. The mitral valve is degenerative. Mild mitral valve regurgitation. No  evidence of mitral stenosis.  10. The tricuspid valve is normal in structure.  11. The aortic valve is tricuspid. Aortic valve regurgitation is trivial.  Mild aortic valve sclerosis without stenosis.  12. Pulmonic regurgitation is mild.  13. The pulmonic valve was not well visualized. Pulmonic valve  regurgitation is mild.  14. Normal pulmonary artery systolic pressure.  15. The inferior vena cava is normal in size with greater than 50%  respiratory variability, suggesting right atrial pressure of 3 mmHg.   Laboratory Data:  High Sensitivity Troponin:   Recent Labs  Lab 07/10/22 1129 07/10/22 1500  TROPONINIHS 33* 32*      Chemistry Recent Labs  Lab 07/10/22 1129  NA 136  K 3.5  CL 101  CO2 23  GLUCOSE 142*  BUN 28*  CREATININE 2.02*  CALCIUM 9.1  GFRNONAA 31*  ANIONGAP 12    No results for input(s): "PROT", "ALBUMIN", "AST", "ALT", "ALKPHOS", "BILITOT" in the last 168 hours. Lipids No results for input(s): "CHOL", "TRIG", "HDL", "LABVLDL", "LDLCALC", "CHOLHDL" in the last 168 hours. Hematology Recent Labs  Lab 07/10/22 1129  WBC 7.1  RBC 4.78  HGB 16.3  HCT 47.8  MCV 100.0  MCH 34.1*  MCHC 34.1  RDW 13.7  PLT 108*   Thyroid No results for input(s): "TSH", "FREET4" in the last 168 hours. BNP Recent Labs  Lab 07/10/22 1129  BNP 3,339.2*    DDimer No results for input(s): "DDIMER" in the last 168 hours.   Radiology/Studies:  DG Chest Port 1 View  Result Date: 07/10/2022 CLINICAL DATA:  Shortness of breath. EXAM: PORTABLE CHEST 1 VIEW COMPARISON:  07/08/2022 FINDINGS: The cardio pericardial silhouette is enlarged. Vascular congestion noted with diffuse interstitial opacity suggesting edema. No  substantial pleural effusion. Bones are diffusely demineralized. Telemetry leads overlie the chest. IMPRESSION: Enlargement of the cardiopericardial silhouette with vascular congestion and interstitial edema. Electronically Signed   By: Kennith Center M.D.   On: 07/10/2022 12:26     Assessment and Plan:   Acute congestive heart failure - elevated BNP, congestion on CXR, volume up on exam - has been started on lasix 40 mg IV BID - consider discontinuing BB until we better understand his LVEF - has received 40mg  IV lasix with good output per patient - echo pending - he eats a high sodium diet   CKD stage IIIb Baseline sCr 1.4-1.6 sCr 2.02, CO2 23 - need to follow renal function with GFR 31   Hypertension PTA: losartan-HCTZ - holding this for renal function and diuresis If BP control needed, consider hydralazine +/- imdur - BP currently 166/108 - will start low dose hydralazine   CAD s/p CABG 2013 HS troponin low and flat He denies chest pain to me Activity may be close to 4.0 METS for his age and functional ability   Hyperlipidemia with LDL goal < 70 Continue crestor Do not feel strongly about rechecking a lipid panel   Permanent atrial fibrillation Rate controlled - consider holding BB given unknown EF Would not recommend cardizem if rate control needed   Chronic anticoagulation Maintained on eliquis 2.5 mg BID    Risk Assessment/Risk Scores:   New York Heart Association (NYHA) Functional Class NYHA Class IV  CHA2DS2-VASc Score = 6   This indicates a 9.7% annual risk of stroke. The patient's score is based upon: CHF History: 1 HTN History: 1 Diabetes History: 1 Stroke History: 0 Vascular Disease History: 1 Age Score: 2 Gender Score: 0    For questions or updates, please contact [facility] Please consult [email or web address] for contact info under     Signed, Marcelino Duster, [place]  07/10/2022 5:21 PM

## 2022-07-10 NOTE — ED Notes (Signed)
ED TO INPATIENT HANDOFF REPORT  ED Nurse Name and Phone #: Josh  S Name/Age/Gender Cody Thomas 89 y.o. male Room/Bed: 034C/034C  Code Status   Code Status: DNR  Home/SNF/Other Home Patient oriented to: self, place, time, and situation Is this baseline? Yes   Triage Complete: Triage complete  Chief Complaint CHF exacerbation [facility]) [I50.9]  Triage Note PT arrives via POV from home. PT reports cough and sob since last Wednesday. Pt was treated for pneumonia. Family member states she was called and told that his bnp was in the 5,000s. Family reports some new confusion over the past couple of weeks. Patient's initial o2 was 85% on RA. Patient placed on 3LNC. Pt reports some chest pain yesterday that has resolved.    Allergies Allergies  Allergen Reactions   Rosuvastatin     Nightmares in high dosage Other reaction(s): nightmares when taken in LARGE  doses Other reaction(s): nightmares when taken in LARGE  doses Other reaction(s): nightmares when taken in LARGE  doses   Sertraline Diarrhea    Other reaction(s): diarrhea   Simvastatin     Nightmares in high dosage Other reaction(s): nightmares when taken in LARGE  doses Other reaction(s): nightmares when taken in LARGE  doses Other reaction(s): nightmares when taken in LARGE  doses    Level of Care/Admitting Diagnosis ED Disposition     ED Disposition  Admit   Condition  --   Comment  Hospital Area: [facility] [[id number]]  Level of Care: Telemetry Cardiac [[id number]]  May place patient in observation at [facility] or [facility] if equivalent level of care is available:: No  Covid Evaluation: Asymptomatic - no recent exposure (last 10 days) testing not required  Diagnosis: CHF exacerbation [facility]) [[id number]]  Admitting Physician: Glade Lloyd [[id number]]  Attending Physician: Glade Lloyd [[id number]]          B Medical/Surgery History Past Medical History:  Diagnosis Date   Anxiety    Bunion  09/29/2011   Cancer (HCC)    nose/Dr. Albertini;skin   Chronic constipation    Chronic kidney disease    stage 111   Coronary atherosclerosis of native coronary artery    2009 LAD CIRC DES   Depression    doesn't take any meds for this   Diabetes mellitus    takes Januvia daily   Dizziness    Enlarged prostate    but doesn't require meds at present   GERD (gastroesophageal reflux disease)    TUMS prn   H/O hiatal hernia    Headache(784.0)    History of gout    doesn't require meds   Hx of cardiac cath    Hyperlipidemia    Crestor 3 x wk and Zetia daily   Hypertension    takes Hyzaar daily   Insomnia    doesn't require meds at present time   Joint pain    Onychomycosis 10/05/2012   x 10   PONV (postoperative nausea and vomiting)    pt states extremely sick   Shortness of breath    with exertion   Past Surgical History:  Procedure Laterality Date   bladder cancer     CARDIAC CATHETERIZATION  2013   cataracts removed     CHOLECYSTECTOMY     COLONOSCOPY     CORONARY ANGIOPLASTY     2 stents from 2009   CORONARY ARTERY BYPASS GRAFT  10/29/2011   Procedure: CORONARY ARTERY BYPASS GRAFTING (CABG);  Surgeon: Kerin Perna, MD;  Location: [facility] OR;  Service: Open Heart Surgery;  Laterality: N/A;   cyst removed from finger     right hand   EYE SURGERY     bilateral cataracts   HEMORRHOID SURGERY     HERNIA REPAIR     x 2;inguinal   KIDNEY STONE SURGERY     LEFT HEART CATHETERIZATION WITH CORONARY ANGIOGRAM N/A 10/21/2011   Procedure: LEFT HEART CATHETERIZATION WITH CORONARY ANGIOGRAM;  Surgeon: Donato Schultz, MD;  Location: [facility] CATH LAB;  Service: Cardiovascular;  Laterality: N/A;   rotator cuff surgery     right    skin cancer removed       A IV Location/Drains/Wounds Patient Lines/Drains/Airways Status     Active Line/Drains/Airways     Name Placement date Placement time Site Days   Peripheral IV 07/10/22 20 G Right Antecubital 07/10/22  1252  Antecubital  less  than 1   Incision 10/29/11 Chest Other (Comment) 10/29/11  0936  -- 3907   Incision 10/29/11 Chest Other (Comment) 10/29/11  0936  -- 3907   Incision 10/29/11 Leg Right 10/29/11  0936  -- 3907   Incision 10/29/11 Groin Right 10/29/11  0936  -- 3907            Intake/Output Last 24 hours No intake or output data in the 24 hours ending 07/10/22 1504  Labs/Imaging Results for orders placed or performed during the hospital encounter of 07/10/22 (from the past 48 hour(s))  Basic metabolic panel     Status: Abnormal   Collection Time: 07/10/22 11:29 AM  Result Value Ref Range   Sodium 136 135 - 145 mmol/L   Potassium 3.5 3.5 - 5.1 mmol/L   Chloride 101 98 - 111 mmol/L   CO2 23 22 - 32 mmol/L   Glucose, Bld 142 (H) 70 - 99 mg/dL    Comment: Glucose reference range applies only to samples taken after fasting for at least 8 hours.   BUN 28 (H) 8 - 23 mg/dL   Creatinine, Ser 9.14 (H) 0.61 - 1.24 mg/dL   Calcium 9.1 8.9 - 78.2 mg/dL   GFR, Estimated 31 (L) >60 mL/min    Comment: (NOTE) Calculated using the CKD-EPI Creatinine Equation (2021)    Anion gap 12 5 - 15    Comment: Performed at [facility], [address], [place], [place] [address]  CBC     Status: Abnormal   Collection Time: 07/10/22 11:29 AM  Result Value Ref Range   WBC 7.1 4.0 - 10.5 K/uL   RBC 4.78 4.22 - 5.81 MIL/uL   Hemoglobin 16.3 13.0 - 17.0 g/dL   HCT 30.8 65.7 - 84.6 %   MCV 100.0 80.0 - 100.0 fL   MCH 34.1 (H) 26.0 - 34.0 pg   MCHC 34.1 30.0 - 36.0 g/dL   RDW 96.2 95.2 - 84.1 %   Platelets 108 (L) 150 - 400 K/uL    Comment: REPEATED TO VERIFY   nRBC 0.0 0.0 - 0.2 %    Comment: Performed at [facility], [address], [place], [place] [address]  Troponin I (High Sensitivity)     Status: Abnormal   Collection Time: 07/10/22 11:29 AM  Result Value Ref Range   Troponin I (High Sensitivity) 33 (H) <18 ng/L    Comment: (NOTE) Elevated high sensitivity troponin I (hsTnI) values and  significant  changes across serial measurements may suggest ACS but many other  chronic and acute conditions are known to  elevate hsTnI results.  Refer to the "Links" section for chest pain algorithms and additional  guidance. Performed at [facility], [address], [place], [place] [address]   Brain natriuretic peptide     Status: Abnormal   Collection Time: 07/10/22 11:29 AM  Result Value Ref Range   B Natriuretic Peptide 3,339.2 (H) 0.0 - 100.0 pg/mL    Comment: Performed at [facility], [address] [place], [place] [address]  Lactic acid, plasma     Status: None   Collection Time: 07/10/22 12:02 PM  Result Value Ref Range   Lactic Acid, Venous 1.9 0.5 - 1.9 mmol/L    Comment: Performed at [facility], [address], [place], [place] [address]   DG Chest Port 1 View  Result Date: 07/10/2022 CLINICAL DATA:  Shortness of breath. EXAM: PORTABLE CHEST 1 VIEW COMPARISON:  07/08/2022 FINDINGS: The cardio pericardial silhouette is enlarged. Vascular congestion noted with diffuse interstitial opacity suggesting edema. No substantial pleural effusion. Bones are diffusely demineralized. Telemetry leads overlie the chest. IMPRESSION: Enlargement of the cardiopericardial silhouette with vascular congestion and interstitial edema. Electronically Signed   By: Kennith Center M.D.   On: 07/10/2022 12:26    Pending Labs Unresulted Labs (From admission, onward)     Start     Ordered   07/11/22 0500  CBC  Tomorrow morning,   R        07/10/22 1356   07/11/22 0500  Comprehensive metabolic panel  Tomorrow morning,   R        07/10/22 1356   07/11/22 0500  Magnesium  Tomorrow morning,   R        07/10/22 1356   07/10/22 1425  Procalcitonin  Once,   R       References:    Procalcitonin Lower Respiratory Tract Infection AND Sepsis Procalcitonin Algorithm   07/10/22 1424   07/10/22 1152  Lactic acid, plasma  Now then every 2 hours,   R (with STAT occurrences)      07/10/22  1151   07/10/22 1152  Culture, blood (routine x 2)  BLOOD CULTURE X 2,   R (with STAT occurrences)      07/10/22 1151            Vitals/Pain Today's Vitals   07/10/22 1116 07/10/22 1119 07/10/22 1126  BP: 134/88    Pulse: 69    Resp: 20    Temp: 98.3 F (36.8 C)    SpO2: (!) 85% 94%   Height:   5\' 4"  (1.626 m)  PainSc:   0-No pain    Isolation Precautions No active isolations  Medications Medications  furosemide (LASIX) injection 40 mg (has no administration in time range)  acetaminophen (TYLENOL) tablet 650 mg (has no administration in time range)    Or  acetaminophen (TYLENOL) suppository 650 mg (has no administration in time range)  ondansetron (ZOFRAN) tablet 4 mg (has no administration in time range)    Or  ondansetron (ZOFRAN) injection 4 mg (has no administration in time range)  albuterol (PROVENTIL) (2.5 MG/3ML) 0.083% nebulizer solution 2.5 mg (has no administration in time range)  ipratropium-albuterol (DUONEB) 0.5-2.5 (3) MG/3ML nebulizer solution 3 mL (3 mLs Nebulization Given 07/10/22 1452)  guaiFENesin (MUCINEX) 12 hr tablet 600 mg (600 mg Oral Given 07/10/22 1452)  apixaban (ELIQUIS) tablet 2.5 mg (has no administration in time range)  furosemide (LASIX) injection 40 mg (40 mg Intravenous Given 07/10/22 1451)  Mobility walks with device     Focused Assessments     R Recommendations: See Admitting Provider Note  Report given to:   Additional Notes:

## 2022-07-10 NOTE — ED Triage Notes (Addendum)
PT arrives via POV from home. PT reports cough and sob since last Wednesday. Pt was treated for pneumonia. Family member states she was called and told that his bnp was in the 5,000s. Family reports some new confusion over the past couple of weeks. Patient's initial o2 was 85% on RA. Patient placed on 3LNC. Pt reports some chest pain yesterday that has resolved.

## 2022-07-10 NOTE — H&P (Signed)
History and Physical    Cody Thomas:[id number] DOB: 05/28/1930 DOA: 07/10/2022  PCP: Gweneth Dimitri, MD   Patient coming from: Home  I have personally briefly reviewed patient's old medical records in [facility] Link  Chief Complaint: Shortness of breath and cough  HPI: Cody Thomas is a 89 y.o. male with medical history significant of CAD status post CABG, status post DES LAD, permanent A-fib on Eliquis, diabetes mellitus type 2, GERD, gout, hyperlipidemia, hypertension, OSA on CPAP, COVID-19 pneumonia, CKD stage IIIb presented with worsening shortness of breath and cough for the last week.  Patient was recently seen by his PCP few days ago and started on Augmentin for possible pneumonia.  He was called by his PCP today because of significantly elevated BNP and was asked to present to the ED.  Patient is extremely hard of hearing and a poor historian.  Daughter present at bedside states that patient has had no fever, nausea, vomiting, chest pain, loss of consciousness, syncope, seizures, diarrhea or dysuria.  Patient denies any increase or decrease in urine output.  He complains of increasing lower extremity swelling.  ED Course: He was hypoxic on room air and required 2 L oxygen via nasal cannula.  Creatinine was 2.02, BNP 3339.2, troponin of 33.  Cardiology was consulted by ED provider.  He was given IV Lasix. Hospitalist service was called to evaluate the patient.  Review of Systems: As per HPI otherwise all other systems were reviewed and are negative.   Past Medical History:  Diagnosis Date   Anxiety    Bunion 09/29/2011   Cancer [facility])    nose/Dr. Albertini;skin   Chronic constipation    Chronic kidney disease    stage 111   Coronary atherosclerosis of native coronary artery    2009 LAD CIRC DES   Depression    doesn't take any meds for this   Diabetes mellitus    takes Januvia daily   Dizziness    Enlarged prostate    but doesn't require meds at present    GERD (gastroesophageal reflux disease)    TUMS prn   H/O hiatal hernia    Headache(784.0)    History of gout    doesn't require meds   Hx of cardiac cath    Hyperlipidemia    Crestor 3 x wk and Zetia daily   Hypertension    takes Hyzaar daily   Insomnia    doesn't require meds at present time   Joint pain    Onychomycosis 10/05/2012   x 10   PONV (postoperative nausea and vomiting)    pt states extremely sick   Shortness of breath    with exertion    Past Surgical History:  Procedure Laterality Date   bladder cancer     CARDIAC CATHETERIZATION  2013   cataracts removed     CHOLECYSTECTOMY     COLONOSCOPY     CORONARY ANGIOPLASTY     2 stents from 2009   CORONARY ARTERY BYPASS GRAFT  10/29/2011   Procedure: CORONARY ARTERY BYPASS GRAFTING (CABG);  Surgeon: Kerin Perna, MD;  Location: [facility] OR;  Service: Open Heart Surgery;  Laterality: N/A;   cyst removed from finger     right hand   EYE SURGERY     bilateral cataracts   HEMORRHOID SURGERY     HERNIA REPAIR     x 2;inguinal   KIDNEY STONE SURGERY     LEFT HEART CATHETERIZATION WITH CORONARY ANGIOGRAM N/A  10/21/2011   Procedure: LEFT HEART CATHETERIZATION WITH CORONARY ANGIOGRAM;  Surgeon: Donato Schultz, MD;  Location: [facility] CATH LAB;  Service: Cardiovascular;  Laterality: N/A;   rotator cuff surgery     right    skin cancer removed       reports that he quit smoking about 38 years ago. His smoking use included cigarettes. He has a 45.00 pack-year smoking history. He has never used smokeless tobacco. He reports that he does not drink alcohol and does not use drugs.  Allergies  Allergen Reactions   Rosuvastatin     Nightmares in high dosage Other reaction(s): nightmares when taken in LARGE  doses Other reaction(s): nightmares when taken in LARGE  doses Other reaction(s): nightmares when taken in LARGE  doses   Sertraline Diarrhea    Other reaction(s): diarrhea   Simvastatin     Nightmares in high dosage Other  reaction(s): nightmares when taken in LARGE  doses Other reaction(s): nightmares when taken in LARGE  doses Other reaction(s): nightmares when taken in LARGE  doses    Family History  Problem Relation Age of Onset   Heart disease Mother    Stroke Father    Cancer Brother     Prior to Admission medications   Medication Sig Start Date End Date Taking? Authorizing Provider  acetaminophen (TYLENOL) 500 MG tablet Take by mouth.    [provider]  apixaban (ELIQUIS) 2.5 MG TABS tablet Take 1 tablet (2.5 mg total) by mouth 2 (two) times daily. 04/30/22   Jake Bathe, MD  azelastine (ASTELIN) 0.1 % nasal spray Place 1 spray into both nostrils 2 (two) times daily. 02/26/21   [provider]  B Complex Vitamins (VITAMIN B COMPLEX) TABS See admin instructions.    [provider]  Blood Glucose Monitoring Suppl (BAYER CONTOUR LINK 2.4) w/Device KIT Check sugar once daily. 05/06/20   Reather Littler, MD  calcium carbonate (OS-CAL) 1250 (500 Ca) MG chewable tablet Chew by mouth.    [provider]  calcium carbonate (TUMS - DOSED IN MG ELEMENTAL CALCIUM) 500 MG chewable tablet 2 tablets as needed for indegestion    [provider]  Coenzyme Q10-Vitamin E 100-1 MG-UNT/5ML SYRP Take 100 mg by mouth daily. Take 2 tablespoons (100MG ) Daily    [provider]  escitalopram (LEXAPRO) 5 MG tablet 0.5 tablets 04/17/21   [provider]  fluorouracil (EFUDEX) 5 % cream Apply topically 2 (two) times daily. 01/02/22   [provider]  fluticasone (FLONASE) 50 MCG/ACT nasal spray Place 1 spray into both nostrils daily as needed for allergies or rhinitis.    [provider]  fluticasone (FLONASE) 50 MCG/ACT nasal spray Place into the nose.    [provider]  glucose blood test strip Use as instructed check sugar once daily. 05/06/20   Reather Littler, MD  ketoconazole (NIZORAL) 2 % cream Apply topically. 01/02/22   [provider]  losartan-hydrochlorothiazide (HYZAAR) 100-25 MG tablet TAKE 1 TABLET BY MOUTH DAILY 11/21/21   Jake Bathe, MD  metoprolol tartrate (LOPRESSOR) 25 MG tablet TAKE 1 TABLET(25 MG) BY MOUTH TWICE DAILY 11/14/21   Jake Bathe, MD  Omega-3 Fatty Acids (FISH OIL) 1200 MG CPDR 2 capsules    [provider]  ondansetron (ZOFRAN) 4 MG tablet Take by mouth.    [provider]  repaglinide (PRANDIN) 0.5 MG tablet TAKE 1 TABLET BY MOUTH EVERY MORNING BEFORE BREAKFAST AND BEFORE DINNER 09/12/21   Reather Littler,  MD  rosuvastatin (CRESTOR) 20 MG tablet Take 20 mg by mouth See admin instructions. Take 1 tablet by mouth three times weekly.    [provider]  traMADol (ULTRAM) 50 MG tablet TAKE 1 TABLET BY MOUTH EVERY 6 HOURS AS NEEDED 09/01/17   Kerrin Champagne, MD  traMADol Janean Sark) 50 MG tablet     [provider]  traZODone (DESYREL) 50 MG tablet Take 50 mg by mouth at bedtime as needed. 08/22/20   [provider]  triamcinolone (KENALOG) 0.025 % cream Apply topically. 01/02/22   [provider]  Zinc 100 MG TABS 1 tablet    [provider]    Physical Exam: Vitals:   07/10/22 1116 07/10/22 1119 07/10/22 1126  BP: 134/88    Pulse: 69    Resp: 20    Temp: 98.3 F (36.8 C)    SpO2: (!) 85% 94%   Height:   5\' 4"  (1.626 m)    Constitutional: NAD, calm, comfortable.  Currently on 3 L oxygen via nasal cannula.  Looks chronically ill and deconditioned.  Extremely hard of hearing. Vitals:   07/10/22 1116 07/10/22 1119 07/10/22 1126  BP: 134/88    Pulse: 69    Resp: 20    Temp: 98.3 F (36.8 C)    SpO2: (!) 85% 94%   Height:   5\' 4"  (1.626 m)   Eyes: PERRL, lids and conjunctivae normal ENMT: Mucous membranes are moist. Posterior pharynx clear of any exudate or lesions. Neck: normal, supple, no masses, no thyromegaly Respiratory: bilateral decreased breath sounds at bases, no wheezing, scattered crackles present.  Normal respiratory  effort. No accessory muscle use.  Cardiovascular: S1 S2 positive, rate controlled.  Bilateral lower extremity edema present.  2+ pedal pulses.  Abdomen: no tenderness, no masses palpated. No hepatosplenomegaly. Bowel sounds positive.  Musculoskeletal: no clubbing / cyanosis. No joint deformity upper and lower extremities.  Skin: no rashes, lesions, ulcers. No induration Neurologic: Extremely slow to respond.  Poor historian.  CN 2-12 grossly intact. Moving extremities. No focal neurologic deficits.  Psychiatric: Mostly flat affect.  Not agitated currently. Labs on Admission: I have personally reviewed following labs and imaging studies  CBC: Recent Labs  Lab 07/10/22 1129  WBC 7.1  HGB 16.3  HCT 47.8  MCV 100.0  PLT 108*   Basic Metabolic Panel: Recent Labs  Lab 07/10/22 1129  NA 136  K 3.5  CL 101  CO2 23  GLUCOSE 142*  BUN 28*  CREATININE 2.02*  CALCIUM 9.1   GFR: CrCl cannot be calculated (Unknown ideal weight.). Liver Function Tests: No results for input(s): "AST", "ALT", "ALKPHOS", "BILITOT", "PROT", "ALBUMIN" in the last 168 hours. No results for input(s): "LIPASE", "AMYLASE" in the last 168 hours. No results for input(s): "AMMONIA" in the last 168 hours. Coagulation Profile: No results for input(s): "INR", "PROTIME" in the last 168 hours. Cardiac Enzymes: No results for input(s): "CKTOTAL", "CKMB", "CKMBINDEX", "TROPONINI" in the last 168 hours. BNP (last 3 results) No results for input(s): "PROBNP" in the last 8760 hours. HbA1C: No results for input(s): "HGBA1C" in the last 72 hours. CBG: No results for input(s): "GLUCAP" in the last 168 hours. Lipid Profile: No results for input(s): "CHOL", "HDL", "LDLCALC", "TRIG", "CHOLHDL", "LDLDIRECT" in the last 72 hours. Thyroid Function Tests: No results for input(s): "TSH", "T4TOTAL", "FREET4", "T3FREE", "THYROIDAB" in the last 72 hours. Anemia Panel: No results for input(s): "VITAMINB12", "FOLATE", "FERRITIN",  "TIBC", "IRON", "RETICCTPCT" in the last 72 hours. Urine  analysis:    Component Value Date/Time   COLORURINE YELLOW 03/03/2016 0917   APPEARANCEUR CLEAR 03/03/2016 0917   LABSPEC 1.020 03/03/2016 0917   PHURINE 6.0 03/03/2016 0917   GLUCOSEU 100 (A) 03/03/2016 0917   HGBUR NEGATIVE 03/03/2016 0917   BILIRUBINUR NEGATIVE 03/03/2016 0917   KETONESUR NEGATIVE 03/03/2016 0917   PROTEINUR 30 (A) 03/27/2014 1418   UROBILINOGEN 0.2 03/03/2016 0917   NITRITE NEGATIVE 03/03/2016 0917   LEUKOCYTESUR NEGATIVE 03/03/2016 0917    Radiological Exams on Admission: DG Chest Port 1 View  Result Date: 07/10/2022 CLINICAL DATA:  Shortness of breath. EXAM: PORTABLE CHEST 1 VIEW COMPARISON:  07/08/2022 FINDINGS: The cardio pericardial silhouette is enlarged. Vascular congestion noted with diffuse interstitial opacity suggesting edema. No substantial pleural effusion. Bones are diffusely demineralized. Telemetry leads overlie the chest. IMPRESSION: Enlargement of the cardiopericardial silhouette with vascular congestion and interstitial edema. Electronically Signed   By: Kennith Center M.D.   On: 07/10/2022 12:26    EKG: Independently reviewed.  No ST elevations or depressions noted  Assessment/Plan  Probable acute diastolic heart failure Acute respiratory failure with hypoxia Positive troponins: Most likely from demand ischemia from heart failure -Presented with worsening shortness of breath and cough for the last week and has been on oral Augmentin as an outpatient for the last few days, prescribed by his PCP. -BNP more than 3000; troponins only mildly elevated. -Lasix 40 mg IV every 12 hours.  Strict input and output.  Daily weights.  Fluid restriction.  Follow cardiology recommendations.  2D echo. -Has been on oral Augmentin for the last few days.  Will hold off on any further antibiotic treatment.  Check procalcitonin.  Currently on 3 L oxygen via nasal cannula.  Wean off as able.  CAD with history  of CABG and stenting Hypertension Hyperlipidemia -Stable.  No chest pain.  Check another set of troponins.  Follow cardiology recommendations. -Resume home beta-blocker, statin and other medications were verified.  Monitor blood pressure  AKI on CKD stage IIIb -Unknown recent baseline creatinine; prior baseline around 1.4-1.6.  Creatinine 2.02 this morning.  Possibly from volume overload.  Continue diuresis as above.  Repeat a.m. labs.  Permanent A-fib -Currently rate controlled.  Continue Eliquis  Thrombocytopenia -Questionable cause.  No signs of bleeding.  Monitor  Diabetes mellitus type 2 -On Prandin at home.  Hold Prandin.  Check CBGs with SSI.  Physical deconditioning -PT eval.   DVT prophylaxis: Eliquis Code Status: DNR.  Verified with daughter at bedside Family Communication: Daughter at bedside Disposition Plan: Home in 1 to 2 days if clinically improves Consults called: Cardiology by ED provider Admission status: Telemetry/observation  Severity of Illness: The appropriate patient status for this patient is OBSERVATION. Observation status is judged to be reasonable and necessary in order to provide the required intensity of service to ensure the patient's safety. The patient's presenting symptoms, physical exam findings, and initial radiographic and laboratory data in the context of their medical condition is felt to place them at decreased risk for further clinical deterioration. Furthermore, it is anticipated that the patient will be medically stable for discharge from the hospital within 2 midnights of admission.     Glade Lloyd MD [facility]  07/10/2022, 2:08 PM

## 2022-07-10 NOTE — ED Provider Notes (Signed)
[place] EMERGENCY DEPARTMENT AT [facility] Provider Note   CSN: [id number] Arrival date & time: 07/10/22  1110     History  Chief Complaint  Patient presents with   Shortness of Breath   Cough    Cody Thomas is a 89 y.o. male.  89 year old male with prior medical history as detailed below presents for evaluation.  Patient was recently seen by his PCP and started on Augmentin for treatment of possible pneumonia.  Patient with increased cough and shortness of breath despite treatment with Augmentin.  Patient was called today by PCP and advised that his BNP was significantly elevated.  Patient was sent to the ED for evaluation of same.  In triage patient was noted to be hypoxic with room air sat of 85%.  On 3 L nasal cannula patient is improved with improved pulse ox.  Patient does complain of increased lower extremity edema.  He denies current chest pain.  He did endorse some chest discomfort yesterday.  The history is provided by the patient and medical records.       Home Medications Prior to Admission medications   Medication Sig Start Date End Date Taking? Authorizing Provider  acetaminophen (TYLENOL) 500 MG tablet Take by mouth.    [provider]  apixaban (ELIQUIS) 2.5 MG TABS tablet Take 1 tablet (2.5 mg total) by mouth 2 (two) times daily. 04/30/22   Jake Bathe, MD  azelastine (ASTELIN) 0.1 % nasal spray Place 1 spray into both nostrils 2 (two) times daily. 02/26/21   [provider]  B Complex Vitamins (VITAMIN B COMPLEX) TABS See admin instructions.    [provider]  Blood Glucose Monitoring Suppl (BAYER CONTOUR LINK 2.4) w/Device KIT Check sugar once daily. 05/06/20   Reather Littler, MD  calcium carbonate (OS-CAL) 1250 (500 Ca) MG chewable tablet Chew by mouth.    [provider]  calcium carbonate (TUMS - DOSED IN MG ELEMENTAL CALCIUM) 500 MG chewable tablet 2 tablets as needed for indegestion    [provider]  Coenzyme Q10-Vitamin E 100-1 MG-UNT/5ML SYRP Take 100 mg by mouth daily. Take 2 tablespoons (100MG ) Daily    [provider]  escitalopram (LEXAPRO) 5 MG tablet 0.5 tablets 04/17/21   [provider]  fluorouracil (EFUDEX) 5 % cream Apply topically 2 (two) times daily. 01/02/22   [provider]  fluticasone (FLONASE) 50 MCG/ACT nasal spray Place 1 spray into both nostrils daily as needed for allergies or rhinitis.    [provider]  fluticasone (FLONASE) 50 MCG/ACT nasal spray Place into the nose.    [provider]  glucose blood test strip Use as instructed check sugar once daily. 05/06/20   Reather Littler, MD  ketoconazole (NIZORAL) 2 % cream Apply topically. 01/02/22   [provider]  losartan-hydrochlorothiazide (HYZAAR) 100-25 MG tablet TAKE 1 TABLET BY MOUTH DAILY 11/21/21   Jake Bathe, MD  metoprolol tartrate (LOPRESSOR) 25 MG tablet TAKE 1 TABLET(25 MG) BY MOUTH TWICE DAILY 11/14/21   Jake Bathe, MD  Omega-3 Fatty Acids (FISH OIL) 1200 MG CPDR 2 capsules    [provider]  ondansetron (ZOFRAN) 4 MG tablet Take by mouth.    [provider]  repaglinide (PRANDIN) 0.5 MG tablet TAKE 1 TABLET BY MOUTH EVERY MORNING BEFORE BREAKFAST AND BEFORE DINNER 09/12/21   Reather Littler, MD  rosuvastatin (CRESTOR) 20 MG tablet Take 20 mg by mouth See admin instructions. Take 1 tablet by  mouth three times weekly.    [provider]  traMADol (ULTRAM) 50 MG tablet TAKE 1 TABLET BY MOUTH EVERY 6 HOURS AS NEEDED 09/01/17   Kerrin Champagne, MD  traMADol Janean Sark) 50 MG tablet     [provider]  traZODone (DESYREL) 50 MG tablet Take 50 mg by mouth at bedtime as needed. 08/22/20   [provider]  triamcinolone (KENALOG) 0.025 % cream Apply topically. 01/02/22   [provider]  Zinc 100 MG TABS 1 tablet    [provider]      Allergies    Rosuvastatin, Sertraline, and Simvastatin     Review of Systems   Review of Systems  All other systems reviewed and are negative.   Physical Exam Updated Vital Signs BP 134/88 (BP Location: Right Arm)   Pulse 69   Temp 98.3 F (36.8 C)   Resp 20   Ht 5\' 4"  (1.626 m)   SpO2 94%   BMI 28.18 kg/m  Physical Exam Vitals and nursing note reviewed.  Constitutional:      General: He is not in acute distress.    Appearance: Normal appearance. He is well-developed.  HENT:     Head: Normocephalic and atraumatic.  Eyes:     Conjunctiva/sclera: Conjunctivae normal.     Pupils: Pupils are equal, round, and reactive to light.  Cardiovascular:     Rate and Rhythm: Normal rate and regular rhythm.     Heart sounds: Normal heart sounds.  Pulmonary:     Effort: Pulmonary effort is normal. No respiratory distress.     Comments: Decreased breath sounds at bases Abdominal:     General: There is no distension.     Palpations: Abdomen is soft.     Tenderness: There is no abdominal tenderness.  Musculoskeletal:        General: No deformity. Normal range of motion.     Cervical back: Normal range of motion and neck supple.     Right lower leg: Edema present.     Left lower leg: Edema present.  Skin:    General: Skin is warm and dry.  Neurological:     General: No focal deficit present.     Mental Status: He is alert and oriented to person, place, and time.     ED Results / Procedures / Treatments   Labs (all labs ordered are listed, but only abnormal results are displayed) Labs Reviewed  CULTURE, BLOOD (ROUTINE X 2)  CULTURE, BLOOD (ROUTINE X 2)  BASIC METABOLIC PANEL  CBC  BRAIN NATRIURETIC PEPTIDE  LACTIC ACID, PLASMA  LACTIC ACID, PLASMA  TROPONIN I (HIGH SENSITIVITY)    EKG EKG Interpretation  Date/Time:  Friday Jul 10 2022 11:19:08 EDT Ventricular Rate:  78 PR Interval:    QRS Duration: 94 QT Interval:  432 QTC Calculation: 492 R Axis:   109 Text Interpretation: Undetermined rhythm Indeterminate axis  Anterior infarct , age undetermined Abnormal ECG When compared with ECG of 18-Mar-2019 18:12, PREVIOUS ECG IS PRESENT Confirmed by Kristine Royal [phone]) on 07/10/2022 11:50:20 AM  Radiology No results found.  Procedures Procedures    Medications Ordered in ED Medications - No data to display  ED Course/ Medical Decision Making/ A&P                             Medical Decision Making Amount and/or Complexity of Data Reviewed Labs: ordered. Radiology: ordered.  Risk Prescription  drug management. Decision regarding hospitalization.    Medical Screen Complete  This patient presented to the ED with complaint of shortness of breath.  This complaint involves an extensive number of treatment options. The initial differential diagnosis includes, but is not limited to, pneumonia, CHF, etc.  This presentation is: Acute, Chronic, Self-Limited, Previously Undiagnosed, Uncertain Prognosis, Complicated, Systemic Symptoms, and Threat to Life/Bodily Function  Patient presents for evaluation of shortness of breath and hypoxia.  Patient was treated with Augmentin earlier this week for possible pneumonia.  Patient presented today with worsening symptoms including hypoxia.  Exam and history are more consistent with likely CHF exacerbation.    Imaging does suggest CHF exacerbation.  Patient with elevated BNP.  Patient would benefit from admission for diuresis and additional workup.  Cardiology is aware case will consult.  Hospital service is aware case will evaluate for admission.  Additional history obtained:  Additional history obtained from [facility] External records from outside sources obtained and reviewed including prior ED visits and prior Inpatient records.    Lab Tests:  I ordered and personally interpreted labs.  The pertinent results include: CBC, BMP, troponin, lactic acid   Imaging Studies ordered:  I ordered imaging studies including chest x-ray I independently  visualized and interpreted obtained imaging which showed CHF exacerbation I agree with the radiologist interpretation.   Cardiac Monitoring:  The patient was maintained on a cardiac monitor.  I personally viewed and interpreted the cardiac monitor which showed an underlying rhythm of: NSR   Medicines ordered:  I ordered medication including Lasix for diuresis Reevaluation of the patient after these medicines showed that the patient: improved  Problem List / ED Course:  Dyspnea, suspected CHF exacerbation   Reevaluation:  After the interventions noted above, I reevaluated the patient and found that they have: improved  Disposition:  After consideration of the diagnostic results and the patients response to treatment, I feel that the patent would benefit from admission.          Final Clinical Impression(s) / ED Diagnoses Final diagnoses:  Dyspnea, unspecified type    Rx / DC Orders ED Discharge Orders     None         Wynetta Fines, MD 07/10/22 [phone]

## 2022-07-10 NOTE — ED Notes (Signed)
Bp 161/110  HR 60  RR23  [id number]

## 2022-07-10 NOTE — Progress Notes (Signed)
  Echocardiogram 2D Echocardiogram has been performed.  Delcie Roch 07/10/2022, 6:04 PM

## 2022-07-11 DIAGNOSIS — E1122 Type 2 diabetes mellitus with diabetic chronic kidney disease: Secondary | ICD-10-CM | POA: Diagnosis present

## 2022-07-11 DIAGNOSIS — R7989 Other specified abnormal findings of blood chemistry: Secondary | ICD-10-CM | POA: Diagnosis present

## 2022-07-11 DIAGNOSIS — R001 Bradycardia, unspecified: Secondary | ICD-10-CM | POA: Diagnosis present

## 2022-07-11 DIAGNOSIS — I130 Hypertensive heart and chronic kidney disease with heart failure and stage 1 through stage 4 chronic kidney disease, or unspecified chronic kidney disease: Secondary | ICD-10-CM | POA: Diagnosis present

## 2022-07-11 DIAGNOSIS — N184 Chronic kidney disease, stage 4 (severe): Secondary | ICD-10-CM | POA: Diagnosis present

## 2022-07-11 DIAGNOSIS — H9190 Unspecified hearing loss, unspecified ear: Secondary | ICD-10-CM | POA: Diagnosis present

## 2022-07-11 DIAGNOSIS — I5023 Acute on chronic systolic (congestive) heart failure: Secondary | ICD-10-CM | POA: Diagnosis not present

## 2022-07-11 DIAGNOSIS — I340 Nonrheumatic mitral (valve) insufficiency: Secondary | ICD-10-CM | POA: Diagnosis present

## 2022-07-11 DIAGNOSIS — J9601 Acute respiratory failure with hypoxia: Secondary | ICD-10-CM | POA: Diagnosis present

## 2022-07-11 DIAGNOSIS — M109 Gout, unspecified: Secondary | ICD-10-CM | POA: Diagnosis present

## 2022-07-11 DIAGNOSIS — I5043 Acute on chronic combined systolic (congestive) and diastolic (congestive) heart failure: Secondary | ICD-10-CM | POA: Diagnosis not present

## 2022-07-11 DIAGNOSIS — E785 Hyperlipidemia, unspecified: Secondary | ICD-10-CM | POA: Diagnosis present

## 2022-07-11 DIAGNOSIS — Z8616 Personal history of COVID-19: Secondary | ICD-10-CM | POA: Diagnosis not present

## 2022-07-11 DIAGNOSIS — G4733 Obstructive sleep apnea (adult) (pediatric): Secondary | ICD-10-CM | POA: Diagnosis present

## 2022-07-11 DIAGNOSIS — D696 Thrombocytopenia, unspecified: Secondary | ICD-10-CM | POA: Diagnosis present

## 2022-07-11 DIAGNOSIS — N400 Enlarged prostate without lower urinary tract symptoms: Secondary | ICD-10-CM | POA: Diagnosis present

## 2022-07-11 DIAGNOSIS — I2510 Atherosclerotic heart disease of native coronary artery without angina pectoris: Secondary | ICD-10-CM | POA: Diagnosis present

## 2022-07-11 DIAGNOSIS — I4821 Permanent atrial fibrillation: Secondary | ICD-10-CM | POA: Diagnosis present

## 2022-07-11 DIAGNOSIS — Z66 Do not resuscitate: Secondary | ICD-10-CM | POA: Diagnosis present

## 2022-07-11 DIAGNOSIS — K5909 Other constipation: Secondary | ICD-10-CM | POA: Diagnosis present

## 2022-07-11 DIAGNOSIS — E876 Hypokalemia: Secondary | ICD-10-CM | POA: Diagnosis present

## 2022-07-11 DIAGNOSIS — I25810 Atherosclerosis of coronary artery bypass graft(s) without angina pectoris: Secondary | ICD-10-CM | POA: Diagnosis not present

## 2022-07-11 DIAGNOSIS — I272 Pulmonary hypertension, unspecified: Secondary | ICD-10-CM | POA: Diagnosis present

## 2022-07-11 DIAGNOSIS — R0602 Shortness of breath: Secondary | ICD-10-CM | POA: Diagnosis present

## 2022-07-11 DIAGNOSIS — N179 Acute kidney failure, unspecified: Secondary | ICD-10-CM | POA: Diagnosis present

## 2022-07-11 DIAGNOSIS — K219 Gastro-esophageal reflux disease without esophagitis: Secondary | ICD-10-CM | POA: Diagnosis present

## 2022-07-11 LAB — COMPREHENSIVE METABOLIC PANEL
ALT: 15 U/L (ref 0–44)
AST: 23 U/L (ref 15–41)
Albumin: 3.1 g/dL — ABNORMAL LOW (ref 3.5–5.0)
Alkaline Phosphatase: 55 U/L (ref 38–126)
Anion gap: 11 (ref 5–15)
BUN: 26 mg/dL — ABNORMAL HIGH (ref 8–23)
CO2: 29 mmol/L (ref 22–32)
Calcium: 8.9 mg/dL (ref 8.9–10.3)
Chloride: 98 mmol/L (ref 98–111)
Creatinine, Ser: 2.06 mg/dL — ABNORMAL HIGH (ref 0.61–1.24)
GFR, Estimated: 30 mL/min — ABNORMAL LOW (ref >60)
Glucose, Bld: 159 mg/dL — ABNORMAL HIGH (ref 70–99)
Potassium: 2.9 mmol/L — ABNORMAL LOW (ref 3.5–5.1)
Sodium: 138 mmol/L (ref 135–145)
Total Bilirubin: 1.4 mg/dL — ABNORMAL HIGH (ref 0.3–1.2)
Total Protein: 6.1 g/dL — ABNORMAL LOW (ref 6.5–8.1)

## 2022-07-11 LAB — CBC
HCT: 46.9 % (ref 39.0–52.0)
Hemoglobin: 16 g/dL (ref 13.0–17.0)
MCH: 33.8 pg (ref 26.0–34.0)
MCHC: 34.1 g/dL (ref 30.0–36.0)
MCV: 98.9 fL (ref 80.0–100.0)
Platelets: 103 10*3/uL — ABNORMAL LOW (ref 150–400)
RBC: 4.74 MIL/uL (ref 4.22–5.81)
RDW: 13.6 % (ref 11.5–15.5)
WBC: 6.4 10*3/uL (ref 4.0–10.5)
nRBC: 0 % (ref 0.0–0.2)

## 2022-07-11 LAB — MAGNESIUM: Magnesium: 1.7 mg/dL (ref 1.7–2.4)

## 2022-07-11 MED ORDER — MAGNESIUM SULFATE 2 GM/50ML IV SOLN
2.0000 g | Freq: Once | INTRAVENOUS | Status: AC
  Administered 2022-07-11: 2 g via INTRAVENOUS
  Filled 2022-07-11: qty 50

## 2022-07-11 MED ORDER — POTASSIUM CHLORIDE 20 MEQ PO PACK
40.0000 meq | PACK | Freq: Two times a day (BID) | ORAL | Status: AC
  Administered 2022-07-11 – 2022-07-12 (×4): 40 meq via ORAL
  Filled 2022-07-11 (×4): qty 2

## 2022-07-11 NOTE — Progress Notes (Signed)
Rounding Note    Patient Name: Cody Thomas Date of Encounter: 07/11/2022  [facility] Cardiologist: Donato Schultz, MD   Subjective   89 yo with hx of acute on chronic CHF  Admitted with volume overload  Echo from May 10 shows  LVEF of 40-45% Indeterminate diastolic function  Moderately reduced RV function, with mod. RV enlargement  Severe pulmonary HTN with est. PA pressure of 63. Severe RAE, severe LAE  Moderate MR .   I/O are -3.8 liters so far this admission   Admits to eating lots of salt. Is breathing better     Inpatient Medications    Scheduled Meds:  apixaban  2.5 mg Oral BID   furosemide  40 mg Intravenous BID   guaiFENesin  600 mg Oral BID   hydrALAZINE  25 mg Oral Q8H   potassium chloride  40 mEq Oral BID   traZODone  50 mg Oral QHS   Continuous Infusions:  PRN Meds: acetaminophen **OR** acetaminophen, albuterol, ondansetron **OR** ondansetron (ZOFRAN) IV   Vital Signs    Vitals:   07/11/22 0700 07/11/22 0741 07/11/22 1105 07/11/22 1502  BP:  124/86 94/65 (!) 101/55  Pulse:  80 (!) 51   Resp:  17 17   Temp:  97.9 F (36.6 C) 97.9 F (36.6 C)   TempSrc:      SpO2:   96%   Weight: 72 kg     Height:        Intake/Output Summary (Last 24 hours) at 07/11/2022 1607 Last data filed at 07/11/2022 1300 Gross per 24 hour  Intake 720 ml  Output 4500 ml  Net -3780 ml      07/11/2022    7:00 AM 07/10/2022    4:36 PM 04/15/2022    9:48 AM  Last 3 Weights  Weight (lbs) 158 lb 11.7 oz 158 lb 1.1 oz 164 lb 3.2 oz  Weight (kg) 72 kg 71.7 kg 74.481 kg      Telemetry    Atrial fib  - Personally Reviewed  ECG     - Personally Reviewed  Physical Exam   GEN: No acute distress.   Neck: No JVD Cardiac: irreg. Irreg. , soft systolic murmur at LSB and L Ax line  Respiratory: Clear to auscultation bilaterally. GI: Soft, nontender, non-distended  MS: 1-2+ pitting edema No deformity. Neuro:  Nonfocal  Psych: Normal affect   Labs     High Sensitivity Troponin:   Recent Labs  Lab 07/10/22 1129 07/10/22 1500  TROPONINIHS 33* 32*     Chemistry Recent Labs  Lab 07/10/22 1129 07/11/22 0947  NA 136 138  K 3.5 2.9*  CL 101 98  CO2 23 29  GLUCOSE 142* 159*  BUN 28* 26*  CREATININE 2.02* 2.06*  CALCIUM 9.1 8.9  MG  --  1.7  PROT  --  6.1*  ALBUMIN  --  3.1*  AST  --  23  ALT  --  15  ALKPHOS  --  55  BILITOT  --  1.4*  GFRNONAA 31* 30*  ANIONGAP 12 11    Lipids No results for input(s): "CHOL", "TRIG", "HDL", "LABVLDL", "LDLCALC", "CHOLHDL" in the last 168 hours.  Hematology Recent Labs  Lab 07/10/22 1129 07/11/22 0947  WBC 7.1 6.4  RBC 4.78 4.74  HGB 16.3 16.0  HCT 47.8 46.9  MCV 100.0 98.9  MCH 34.1* 33.8  MCHC 34.1 34.1  RDW 13.7 13.6  PLT 108* 103*   Thyroid No  results for input(s): "TSH", "FREET4" in the last 168 hours.  BNP Recent Labs  Lab 07/10/22 1129  BNP 3,339.2*    DDimer No results for input(s): "DDIMER" in the last 168 hours.   Radiology    ECHOCARDIOGRAM COMPLETE  Result Date: 07/10/2022    ECHOCARDIOGRAM REPORT   Patient Name:   ZAHKI CYPERT Date of Exam: 07/10/2022 Medical Rec #:  [id number]          Height:       64.0 in Accession #:    [id number]         Weight:       164.2 lb Date of Birth:  11-Jul-1930           BSA:          1.799 m Patient Age:    89 years           BP:           168/108 mmHg Patient Gender: M                  HR:           80 bpm. Exam Location:  Inpatient Procedure: 2D Echo, Color Doppler and Cardiac Doppler Indications:    acute diastolic chf  History:        Patient has prior history of Echocardiogram examinations, most                 recent 03/19/2019. CHF, chronic kidney disease, Arrythmias:Atrial                 Fibrillation; Risk Factors:Hypertension, Sleep Apnea, Diabetes                 and Dyslipidemia.  Sonographer:    Delcie Roch RDCS Referring Phys: [id number] Glade Lloyd IMPRESSIONS  1. Left ventricular ejection fraction, by  estimation, is 40 to 45%. The left ventricle has mildly decreased function. The left ventricle demonstrates global hypokinesis. There is moderate asymmetric left ventricular hypertrophy of the basal-septal segment. Left ventricular diastolic parameters are indeterminate.  2. Right ventricular systolic function is moderately reduced. The right ventricular size is moderately enlarged. There is severely elevated pulmonary artery systolic pressure. The estimated right ventricular systolic pressure is 63.4 mmHg.  3. Left atrial size was severely dilated.  4. Right atrial size was severely dilated.  5. The mitral valve is normal in structure. Moderate mitral valve regurgitation.  6. Tricuspid valve regurgitation is moderate.  7. The aortic valve is tricuspid. Aortic valve regurgitation is trivial. Aortic valve sclerosis/calcification is present, without any evidence of aortic stenosis.  8. The inferior vena cava is dilated in size with <50% respiratory variability, suggesting right atrial pressure of 15 mmHg. FINDINGS  Left Ventricle: Left ventricular ejection fraction, by estimation, is 40 to 45%. The left ventricle has mildly decreased function. The left ventricle demonstrates global hypokinesis. The left ventricular internal cavity size was small. There is moderate  asymmetric left ventricular hypertrophy of the basal-septal segment. Left ventricular diastolic parameters are indeterminate. Right Ventricle: The right ventricular size is moderately enlarged. Right vetricular wall thickness was not well visualized. Right ventricular systolic function is moderately reduced. There is severely elevated pulmonary artery systolic pressure. The tricuspid regurgitant velocity is 3.48 m/s, and with an assumed right atrial pressure of 15 mmHg, the estimated right ventricular systolic pressure is 63.4 mmHg. Left Atrium: Left atrial size was severely dilated. Right Atrium: Right atrial size was severely dilated. Pericardium: There  is no evidence of pericardial effusion. Mitral Valve: The mitral valve is normal in structure. Moderate mitral valve regurgitation. Tricuspid Valve: The tricuspid valve is normal in structure. Tricuspid valve regurgitation is moderate. Aortic Valve: The aortic valve is tricuspid. Aortic valve regurgitation is trivial. Aortic valve sclerosis/calcification is present, without any evidence of aortic stenosis. Pulmonic Valve: The pulmonic valve was not well visualized. Pulmonic valve regurgitation is mild to moderate. Aorta: The aortic root is normal in size and structure. Venous: The inferior vena cava is dilated in size with less than 50% respiratory variability, suggesting right atrial pressure of 15 mmHg. IAS/Shunts: The interatrial septum was not well visualized.  LEFT VENTRICLE PLAX 2D LVIDd:         3.70 cm LVIDs:         2.90 cm LV PW:         1.30 cm LV IVS:        1.20 cm LVOT diam:     2.40 cm LVOT Area:     4.52 cm  RIGHT VENTRICLE          IVC RV Basal diam:  3.80 cm  IVC diam: 2.60 cm LEFT ATRIUM              Index        RIGHT ATRIUM           Index LA diam:        4.80 cm  2.67 cm/m   RA Area:     27.60 cm LA Vol (A2C):   103.0 ml 57.25 ml/m  RA Volume:   92.50 ml  51.42 ml/m LA Vol (A4C):   98.1 ml  54.53 ml/m LA Biplane Vol: 106.0 ml 58.92 ml/m   AORTA Ao Root diam: 3.60 cm Ao Asc diam:  3.30 cm TRICUSPID VALVE TR Peak grad:   48.4 mmHg TR Vmax:        348.00 cm/s  SHUNTS Systemic Diam: 2.40 cm Epifanio Lesches MD Electronically signed by Epifanio Lesches MD Signature Date/Time: 07/10/2022/6:39:54 PM    Final    DG Chest Port 1 View  Result Date: 07/10/2022 CLINICAL DATA:  Shortness of breath. EXAM: PORTABLE CHEST 1 VIEW COMPARISON:  07/08/2022 FINDINGS: The cardio pericardial silhouette is enlarged. Vascular congestion noted with diffuse interstitial opacity suggesting edema. No substantial pleural effusion. Bones are diffusely demineralized. Telemetry leads overlie the chest.  IMPRESSION: Enlargement of the cardiopericardial silhouette with vascular congestion and interstitial edema. Electronically Signed   By: Kennith Center M.D.   On: 07/10/2022 12:26    Cardiac Studies      Patient Profile     89 y.o. male    Assessment & Plan     1. Acute on chronic combined CHF:   EF is 40-45%.   Has severe MR and TR . Is feeling better after diuresis.  Cont current meds for now  He will need to greatly reduce his salt intake at discharge.   2. HTN:   BP has improved with diuresis ( actually a bit on the low side )  following .  Would consider reducing his lasix   if he drops his BP or if his creatinine increases significantly          For questions or updates, please contact [facility] Please consult [email or web address] for contact info under        Signed, Kristeen Miss, MD  07/11/2022, 4:07 PM

## 2022-07-11 NOTE — Progress Notes (Signed)
PROGRESS NOTE    Cody Thomas  ZOX:[id number] DOB: 23-Jan-1931 DOA: 07/10/2022 PCP: Gweneth Dimitri, MD   Brief Narrative:  Cody Thomas is a 89 y.o. male with medical history significant of CAD status post CABG, status post DES LAD, permanent A-fib on Eliquis, diabetes mellitus type 2, GERD, gout, hyperlipidemia, hypertension, OSA on CPAP, COVID-19 pneumonia, CKD stage IIIb presented with worsening shortness of breath and cough for the last week.  Patient was recently seen by his PCP few days ago and started on Augmentin for possible pneumonia.  He was called by his PCP today because of significantly elevated BNP and was asked to present to the ED.  Patient is extremely hard of hearing   ED Course: He was hypoxic on room air and required 2 L oxygen via nasal cannula.  Creatinine was 2.02, BNP 3339.2, troponin of 33.  Cardiology was consulted by ED provider.  He was given IV Lasix. Hospitalist service was called to evaluate the patient.  Assessment & Plan:   Acute hypoxemic respiratory failure in the setting of acute systolic congestive heart failure: -BNP more than 3000; troponins only mildly elevated. -Lasix 40 mg IV every 12 hours.  Strict input and output.  Daily weights.  Fluid restriction.  -Echo shows ejection fraction of 40 to 45%, diastolic parameters are indeterminate.  Moderate TR -Cardiology on board.  Appreciate help. -Will try to wean off of oxygen.  Still requiring 2 L of oxygen via nasal cannula   CAD with history of CABG and stenting Elevated troponin: -Stable.  No chest pain.  Hold losartan-HCTZ due to AKI.  Hold metoprolol due to bradycardia.  Hypertension: Stable.  Continue to hold on losartan-hydrochlorothiazide and metoprolol   AKI on CKD stage IIIb -Unknown recent baseline creatinine; prior baseline around 1.4-1.6.  Creatinine 2.06  -Continue diuresis.  Continue to monitor kidney function closely.  Hypokalemia: Hypomagnesemia: Replenished   Permanent  A-fib -Currently rate controlled.  Continue Eliquis.  Hold on metoprolol due to bradycardia.   Thrombocytopenia -Questionable cause.  No signs of bleeding.  Monitor   Diabetes mellitus type 2 -On Prandin at home.  Hold Prandin.  Check CBGs with SSI.   Physical deconditioning -PT/OT evaluation pending  Hyperlipidemia: Continue statin  DVT prophylaxis: Eliquis Code Status: DNR Family Communication:  None present at bedside.  Plan of care discussed with patient in length and he verbalized understanding and agreed with it. Disposition Plan: To be determined  Consultants:  Cardiology  Procedures:  None  Antimicrobials:  None  Status is: Observation    Subjective: Patient seen and examined.  Sitting comfortably on the breakfast and eating breakfast.  Reports improvement in breathing.  No chest pain, palpitation, wheezing, fever or chills.  No acute events overnight.  Objective: Vitals:   07/11/22 0500 07/11/22 0700 07/11/22 0741 07/11/22 1105  BP: 107/66  124/86 94/65  Pulse: 69  80 (!) 51  Resp: 18  17 17   Temp: 97.9 F (36.6 C)  97.9 F (36.6 C) 97.9 F (36.6 C)  TempSrc: Oral     SpO2: 95%   96%  Weight:  72 kg    Height:        Intake/Output Summary (Last 24 hours) at 07/11/2022 1353 Last data filed at 07/11/2022 1300 Gross per 24 hour  Intake 720 ml  Output 4500 ml  Net -3780 ml   Filed Weights   07/10/22 1636 07/11/22 0700  Weight: 71.7 kg 72 kg    Examination:  General exam:  Appears calm and comfortable, on nasal cannula, eating breakfast, hard of hearing, Respiratory system: Clear to auscultation. Respiratory effort normal. Cardiovascular system: S1 & S2 heard, RRR. No JVD, murmurs, rubs, gallops or clicks. No pedal edema. Gastrointestinal system: Abdomen is nondistended, soft and nontender. No organomegaly or masses felt. Normal bowel sounds heard. Central nervous system: Alert and oriented. No focal neurological deficits. Extremities: Symmetric  5 x 5 power. Skin: No rashes, lesions or ulcers Psychiatry: Judgement and insight appear normal. Mood & affect appropriate.    Data Reviewed: I have personally reviewed following labs and imaging studies  CBC: Recent Labs  Lab 07/10/22 1129 07/11/22 0947  WBC 7.1 6.4  HGB 16.3 16.0  HCT 47.8 46.9  MCV 100.0 98.9  PLT 108* 103*   Basic Metabolic Panel: Recent Labs  Lab 07/10/22 1129 07/11/22 0947  NA 136 138  K 3.5 2.9*  CL 101 98  CO2 23 29  GLUCOSE 142* 159*  BUN 28* 26*  CREATININE 2.02* 2.06*  CALCIUM 9.1 8.9  MG  --  1.7   GFR: Estimated Creatinine Clearance: 22.1 mL/min (A) (by C-G formula based on SCr of 2.06 mg/dL (H)). Liver Function Tests: Recent Labs  Lab 07/11/22 0947  AST 23  ALT 15  ALKPHOS 55  BILITOT 1.4*  PROT 6.1*  ALBUMIN 3.1*   No results for input(s): "LIPASE", "AMYLASE" in the last 168 hours. No results for input(s): "AMMONIA" in the last 168 hours. Coagulation Profile: No results for input(s): "INR", "PROTIME" in the last 168 hours. Cardiac Enzymes: No results for input(s): "CKTOTAL", "CKMB", "CKMBINDEX", "TROPONINI" in the last 168 hours. BNP (last 3 results) No results for input(s): "PROBNP" in the last 8760 hours. HbA1C: No results for input(s): "HGBA1C" in the last 72 hours. CBG: No results for input(s): "GLUCAP" in the last 168 hours. Lipid Profile: No results for input(s): "CHOL", "HDL", "LDLCALC", "TRIG", "CHOLHDL", "LDLDIRECT" in the last 72 hours. Thyroid Function Tests: No results for input(s): "TSH", "T4TOTAL", "FREET4", "T3FREE", "THYROIDAB" in the last 72 hours. Anemia Panel: No results for input(s): "VITAMINB12", "FOLATE", "FERRITIN", "TIBC", "IRON", "RETICCTPCT" in the last 72 hours. Sepsis Labs: Recent Labs  Lab 07/10/22 1202 07/10/22 1500  PROCALCITON  --  <0.10  LATICACIDVEN 1.9 1.5    No results found for this or any previous visit (from the past 240 hour(s)).    Radiology Studies: ECHOCARDIOGRAM  COMPLETE  Result Date: 07/10/2022    ECHOCARDIOGRAM REPORT   Patient Name:   Cody Thomas Date of Exam: 07/10/2022 Medical Rec #:  [id number]          Height:       64.0 in Accession #:    [id number]         Weight:       164.2 lb Date of Birth:  [birth year]           BSA:          1.799 m Patient Age:    89 years           BP:           168/108 mmHg Patient Gender: M                  HR:           80 bpm. Exam Location:  Inpatient Procedure: 2D Echo, Color Doppler and Cardiac Doppler Indications:    acute diastolic chf  History:        Patient has prior  history of Echocardiogram examinations, most                 recent 03/19/2019. CHF, chronic kidney disease, Arrythmias:Atrial                 Fibrillation; Risk Factors:Hypertension, Sleep Apnea, Diabetes                 and Dyslipidemia.  Sonographer:    Delcie Roch RDCS Referring Phys: [id number] Glade Lloyd IMPRESSIONS  1. Left ventricular ejection fraction, by estimation, is 40 to 45%. The left ventricle has mildly decreased function. The left ventricle demonstrates global hypokinesis. There is moderate asymmetric left ventricular hypertrophy of the basal-septal segment. Left ventricular diastolic parameters are indeterminate.  2. Right ventricular systolic function is moderately reduced. The right ventricular size is moderately enlarged. There is severely elevated pulmonary artery systolic pressure. The estimated right ventricular systolic pressure is 63.4 mmHg.  3. Left atrial size was severely dilated.  4. Right atrial size was severely dilated.  5. The mitral valve is normal in structure. Moderate mitral valve regurgitation.  6. Tricuspid valve regurgitation is moderate.  7. The aortic valve is tricuspid. Aortic valve regurgitation is trivial. Aortic valve sclerosis/calcification is present, without any evidence of aortic stenosis.  8. The inferior vena cava is dilated in size with <50% respiratory variability, suggesting right atrial pressure of 15  mmHg. FINDINGS  Left Ventricle: Left ventricular ejection fraction, by estimation, is 40 to 45%. The left ventricle has mildly decreased function. The left ventricle demonstrates global hypokinesis. The left ventricular internal cavity size was small. There is moderate  asymmetric left ventricular hypertrophy of the basal-septal segment. Left ventricular diastolic parameters are indeterminate. Right Ventricle: The right ventricular size is moderately enlarged. Right vetricular wall thickness was not well visualized. Right ventricular systolic function is moderately reduced. There is severely elevated pulmonary artery systolic pressure. The tricuspid regurgitant velocity is 3.48 m/s, and with an assumed right atrial pressure of 15 mmHg, the estimated right ventricular systolic pressure is 63.4 mmHg. Left Atrium: Left atrial size was severely dilated. Right Atrium: Right atrial size was severely dilated. Pericardium: There is no evidence of pericardial effusion. Mitral Valve: The mitral valve is normal in structure. Moderate mitral valve regurgitation. Tricuspid Valve: The tricuspid valve is normal in structure. Tricuspid valve regurgitation is moderate. Aortic Valve: The aortic valve is tricuspid. Aortic valve regurgitation is trivial. Aortic valve sclerosis/calcification is present, without any evidence of aortic stenosis. Pulmonic Valve: The pulmonic valve was not well visualized. Pulmonic valve regurgitation is mild to moderate. Aorta: The aortic root is normal in size and structure. Venous: The inferior vena cava is dilated in size with less than 50% respiratory variability, suggesting right atrial pressure of 15 mmHg. IAS/Shunts: The interatrial septum was not well visualized.  LEFT VENTRICLE PLAX 2D LVIDd:         3.70 cm LVIDs:         2.90 cm LV PW:         1.30 cm LV IVS:        1.20 cm LVOT diam:     2.40 cm LVOT Area:     4.52 cm  RIGHT VENTRICLE          IVC RV Basal diam:  3.80 cm  IVC diam: 2.60 cm  LEFT ATRIUM              Index        RIGHT ATRIUM  Index LA diam:        4.80 cm  2.67 cm/m   RA Area:     27.60 cm LA Vol (A2C):   103.0 ml 57.25 ml/m  RA Volume:   92.50 ml  51.42 ml/m LA Vol (A4C):   98.1 ml  54.53 ml/m LA Biplane Vol: 106.0 ml 58.92 ml/m   AORTA Ao Root diam: 3.60 cm Ao Asc diam:  3.30 cm TRICUSPID VALVE TR Peak grad:   48.4 mmHg TR Vmax:        348.00 cm/s  SHUNTS Systemic Diam: 2.40 cm Epifanio Lesches MD Electronically signed by Epifanio Lesches MD Signature Date/Time: 07/10/2022/6:39:54 PM    Final    DG Chest Port 1 View  Result Date: 07/10/2022 CLINICAL DATA:  Shortness of breath. EXAM: PORTABLE CHEST 1 VIEW COMPARISON:  07/08/2022 FINDINGS: The cardio pericardial silhouette is enlarged. Vascular congestion noted with diffuse interstitial opacity suggesting edema. No substantial pleural effusion. Bones are diffusely demineralized. Telemetry leads overlie the chest. IMPRESSION: Enlargement of the cardiopericardial silhouette with vascular congestion and interstitial edema. Electronically Signed   By: Kennith Center M.D.   On: 07/10/2022 12:26    Scheduled Meds:  apixaban  2.5 mg Oral BID   furosemide  40 mg Intravenous BID   guaiFENesin  600 mg Oral BID   hydrALAZINE  25 mg Oral Q8H   traZODone  50 mg Oral QHS   Continuous Infusions:   LOS: 0 days   Time spent: 35 minutes   Rosia Syme Estill Cotta, MD [facility]  If 7PM-7AM, please contact night-coverage [email or web address] 07/11/2022, 1:53 PM

## 2022-07-11 NOTE — Evaluation (Signed)
Physical Therapy Evaluation Patient Details Name: Cody Thomas MRN: [id number] DOB: November 26, 1930 Today's Date: 07/11/2022  History of Present Illness  Pt is an 89 y.o. male who presented 07/10/22 with SOB and cough. Admitted with acute hypoxemic respiratory failure in the setting of acute systolic congestive heart failure. PMH: CAD s/p CABG, s/p DES LAD, permanent A-fib on Eliquis, DM2, GERD, gout, HLD, HTN, OSA on CPAP, CKD stage IIIb, cancer   Clinical Impression  Pt presents with condition above and deficits mentioned below, see PT Problem List. PTA, he was mod I holding onto furniture for support as needed for functional mobility, living with his great granddaughter in a 1-level house with 3 STE. Pt reports chronic L knee issues, resulting in his mildly antalgic gait pattern and likely contributing to his R lateral sway when ambulating. He demonstrates deficits in gross strength, endurance, and balance, but reports this is close to his baseline. Recommended pt utilize one of the canes or walkers he has at home due to his risk for falls. Pt verbalized understanding. Discussed PT options at d/c with pt and pt reports he would rather d/c without PT follow-up but will call his PCP for a PT referral later if needed. See General Comments below in regards to vitals. Will continue to follow acutely.     Recommendations for follow up therapy are one component of a multi-disciplinary discharge planning process, led by the attending physician.  Recommendations may be updated based on patient status, additional functional criteria and insurance authorization.  Follow Up Recommendations       Assistance Recommended at Discharge Intermittent Supervision/Assistance  Patient can return home with the following  A little help with walking and/or transfers;A little help with bathing/dressing/bathroom;Assistance with cooking/housework;Direct supervision/assist for financial management;Direct supervision/assist  for medications management;Assist for transportation;Help with stairs or ramp for entrance    Equipment Recommendations None recommended by PT  Recommendations for Other Services       Functional Status Assessment Patient has had a recent decline in their functional status and demonstrates the ability to make significant improvements in function in a reasonable and predictable amount of time.     Precautions / Restrictions Precautions Precautions: Fall;Other (comment) Precaution Comments: watch SpO2 Restrictions Weight Bearing Restrictions: No      Mobility  Bed Mobility               General bed mobility comments: Pt sitting in recliner upon arrival.    Transfers Overall transfer level: Needs assistance Equipment used: None Transfers: Sit to/from Stand Sit to Stand: Min guard           General transfer comment: Min guard assist for safety with pt pushing up from recliner armrests to stand, no LOB    Ambulation/Gait Ambulation/Gait assistance: Min guard, Min assist Gait Distance (Feet): 180 Feet Assistive device: None Gait Pattern/deviations: Step-through pattern, Decreased stride length, Antalgic, Drifts right/left, Decreased dorsiflexion - right, Decreased dorsiflexion - left Gait velocity: reduced Gait velocity interpretation: <1.31 ft/sec, indicative of household ambulator   General Gait Details: Pt ambulates slowly with a mild antalgic gait pattern due to a chronic "bad" L knee. Pt tends to sway to the R, likely as a result of the "bad" L knee. Min guard assist majoity of time but intermittent minA when he would sway to the R.  Stairs            Wheelchair Mobility    Modified Rankin (Stroke Patients Only)       Balance  Overall balance assessment: Mild deficits observed, not formally tested                                           Pertinent Vitals/Pain Pain Assessment Pain Assessment: Faces Faces Pain Scale: Hurts a  little bit Pain Location: chronic L knee Pain Descriptors / Indicators: Discomfort, Guarding Pain Intervention(s): Monitored during session, Limited activity within patient's tolerance    Home Living Family/patient expects to be discharged to:: Private residence Living Arrangements: Other relatives (great granddaughter) Available Help at Discharge: Family;Available 24 hours/day Type of Home: House Home Access: Stairs to enter Entrance Stairs-Rails: Right Entrance Stairs-Number of Steps: 3   Home Layout: One level Home Equipment: Rollator (4 wheels);Other (comment);Cane - quad;Cane - single point (upright 4-wheeled walker; adjustable bed) Additional Comments: No O2 at baseline    Prior Function Prior Level of Function : Needs assist             Mobility Comments: Mod I, furniture surfs ADLs Comments: Does light household chores and cooking only; daughter assists in managing finances; pt manages his meds, but reports he misses some intermittently; drives; independent with all other ADLs     Hand Dominance        Extremity/Trunk Assessment   Upper Extremity Assessment Upper Extremity Assessment: Defer to OT evaluation    Lower Extremity Assessment Lower Extremity Assessment: Generalized weakness;LLE deficits/detail LLE Deficits / Details: hx of a "bad" knee    Cervical / Trunk Assessment Cervical / Trunk Assessment: Kyphotic  Communication   Communication: HOH  Cognition Arousal/Alertness: Awake/alert Behavior During Therapy: WFL for tasks assessed/performed Overall Cognitive Status: Within Functional Limits for tasks assessed                                          General Comments General comments (skin integrity, edema, etc.): Educated pt on his risk for falls and recs to use a cane or walker, he verbalized understanding; discussed PT options at d/c with pt and pt reports he would rather d/c without PT follow-up but will call his PCP for a PT  referral later if needed; SpO2 >/= 97% on RA at rest at start of session, but then at end it would read as low as 84% at rest on RA but could be unreliable as his pulse ox was adjusted his SpO2 returned to 91% on RA at rest, >/= 89% on RA when ambulating; BP 101/55 (71) sitting, 113/76 (87) standing    Exercises     Assessment/Plan    PT Assessment Patient needs continued PT services  PT Problem List Decreased strength;Decreased balance;Decreased activity tolerance;Decreased mobility;Cardiopulmonary status limiting activity       PT Treatment Interventions DME instruction;Gait training;Stair training;Functional mobility training;Therapeutic activities;Therapeutic exercise;Balance training;Neuromuscular re-education;Patient/family education    PT Goals (Current goals can be found in the Care Plan section)  Acute Rehab PT Goals Patient Stated Goal: to improve and go home PT Goal Formulation: With patient Time For Goal Achievement: 07/25/22 Potential to Achieve Goals: Good    Frequency Min 1X/week     Co-evaluation               AM-PAC PT "6 Clicks" Mobility  Outcome Measure Help needed turning from your back to your side while in a flat  bed without using bedrails?: A Little Help needed moving from lying on your back to sitting on the side of a flat bed without using bedrails?: A Little Help needed moving to and from a bed to a chair (including a wheelchair)?: A Little Help needed standing up from a chair using your arms (e.g., wheelchair or bedside chair)?: A Little Help needed to walk in hospital room?: A Little Help needed climbing 3-5 steps with a railing? : A Little 6 Click Score: 18    End of Session Equipment Utilized During Treatment: Gait belt;Oxygen Activity Tolerance: Patient tolerated treatment well Patient left: in chair;with call bell/phone within reach;with chair alarm set Nurse Communication: Mobility status;Other (comment) (sats) PT Visit Diagnosis:  Unsteadiness on feet (R26.81);Other abnormalities of gait and mobility (R26.89);Muscle weakness (generalized) (M62.81);Difficulty in walking, not elsewhere classified (R26.2)    Time: 4098-1191 PT Time Calculation (min) (ACUTE ONLY): 29 min   Charges:   PT Evaluation $PT Eval Moderate Complexity: 1 Mod PT Treatments $Therapeutic Activity: 8-22 mins        Raymond Gurney, PT, DPT Acute Rehabilitation Services  Office: [phone]   Jewel Baize 07/11/2022, 4:29 PM

## 2022-07-12 DIAGNOSIS — I5022 Chronic systolic (congestive) heart failure: Secondary | ICD-10-CM

## 2022-07-12 DIAGNOSIS — I5043 Acute on chronic combined systolic (congestive) and diastolic (congestive) heart failure: Secondary | ICD-10-CM | POA: Diagnosis not present

## 2022-07-12 HISTORY — DX: Chronic systolic (congestive) heart failure: I50.22

## 2022-07-12 LAB — BASIC METABOLIC PANEL
Anion gap: 8 (ref 5–15)
BUN: 29 mg/dL — ABNORMAL HIGH (ref 8–23)
CO2: 30 mmol/L (ref 22–32)
Calcium: 9.1 mg/dL (ref 8.9–10.3)
Chloride: 98 mmol/L (ref 98–111)
Creatinine, Ser: 1.98 mg/dL — ABNORMAL HIGH (ref 0.61–1.24)
GFR, Estimated: 32 mL/min — ABNORMAL LOW (ref >60)
Glucose, Bld: 109 mg/dL — ABNORMAL HIGH (ref 70–99)
Potassium: 4.1 mmol/L (ref 3.5–5.1)
Sodium: 136 mmol/L (ref 135–145)

## 2022-07-12 LAB — CBC
HCT: 45.9 % (ref 39.0–52.0)
Hemoglobin: 15.8 g/dL (ref 13.0–17.0)
MCH: 34.2 pg — ABNORMAL HIGH (ref 26.0–34.0)
MCHC: 34.4 g/dL (ref 30.0–36.0)
MCV: 99.4 fL (ref 80.0–100.0)
Platelets: 98 10*3/uL — ABNORMAL LOW (ref 150–400)
RBC: 4.62 MIL/uL (ref 4.22–5.81)
RDW: 13.5 % (ref 11.5–15.5)
WBC: 6 10*3/uL (ref 4.0–10.5)
nRBC: 0 % (ref 0.0–0.2)

## 2022-07-12 LAB — MAGNESIUM: Magnesium: 2.1 mg/dL (ref 1.7–2.4)

## 2022-07-12 NOTE — Progress Notes (Addendum)
PROGRESS NOTE    Cody Thomas  UVO:[id number] DOB: 1930/08/06 DOA: 07/10/2022 PCP: Gweneth Dimitri, MD   Brief Narrative:  [id number] with CAD, CABG, followed by DES to LAD, permanent A-fib on Eliquis, diabetes mellitus type 2, GERD, gout, hyperlipidemia, hypertension, OSA on CPAP, COVID-19 pneumonia, CKD stage IIIb presented with worsening shortness of breath and cough for the last week.  -In the ED he was hypoxic, BNP> 3K, creatinine 2.0, troponin 33, chest x-ray with pulmonary vascular congestion and interstitial edema  Assessment & Plan:   Acute hypoxemic respiratory failure Acute on chronic systolic CHF, moderate MR Severe pulmonary hypertension -Echo this admit noted EF of 40-45%, moderately reduced RV, moderate MR, severely elevated PA systolic pressures -Cards following, diuresing well, continue IV Lasix 40 Mg twice daily, he is 5.5 L negative -GDMT limited by CKD -Increase activity, PT eval, BMP in a.m.   CAD with history of CABG and stenting Elevated troponin: -Stable.  No chest pain.  Hold losartan-HCTZ due to AKI.  -Metoprolol on hold  Hypertension: Stable.   -Continue to hold losartan-hydrochlorothiazide and metoprolol   AKI on CKD stage IIIb -Unknown recent baseline creatinine; prior baseline around 1.4-1.6.  Creatinine 2.06  -Continue diuresis.  BMP in a.m.  Hypokalemia: Hypomagnesemia: Replenished   Permanent A-fib -Currently rate controlled.  Continue Eliquis.  Hold on metoprolol due to bradycardia.   Thrombocytopenia -Mild, monitor  Diabetes mellitus type 2 -On Prandin at home.  Hold Prandin.  Check CBGs with SSI.   Physical deconditioning -PT/OT evaluation pending  Hyperlipidemia: Continue statin  DVT prophylaxis: Eliquis Code Status: DNR Family Communication: Discussed with patient in detail, no family at bedside Disposition Plan: To be determined home, home likely 48 hours  Consultants:  Cardiology     Subjective: -Feels better overall,  breathing is improving  Objective: Vitals:   07/11/22 1955 07/11/22 2114 07/12/22 0035 07/12/22 0500  BP: 111/61 131/86 113/76 106/63  Pulse: 68  93   Resp: 18  18 20   Temp: 98.2 F (36.8 C)  98.3 F (36.8 C) 97.9 F (36.6 C)  TempSrc: Oral  Axillary Axillary  SpO2: 95%  97% 94%  Weight:      Height:        Intake/Output Summary (Last 24 hours) at 07/12/2022 0901 Last data filed at 07/11/2022 2238 Gross per 24 hour  Intake 650 ml  Output 2850 ml  Net -2200 ml   Filed Weights   07/10/22 1636 07/11/22 0700  Weight: 71.7 kg 72 kg    Examination:  General exam: Elderly chronically ill male, laying in bed, AAO x 2, mild cognitive deficits, very hard of hearing HEENT: Neck obese unable to assess JVD CVS: S1-S2, irregular rhythm, soft systolic murmur Lungs: Poor air movement bilaterally, basilar rales Abdomen: Soft, nontender, bowel sounds present Extremities: 1+ edema Psych: Flat affect, poor insight and judgment   Data Reviewed: I have personally reviewed following labs and imaging studies  CBC: Recent Labs  Lab 07/10/22 1129 07/11/22 0947 07/12/22 0103  WBC 7.1 6.4 6.0  HGB 16.3 16.0 15.8  HCT 47.8 46.9 45.9  MCV 100.0 98.9 99.4  PLT 108* 103* 98*   Basic Metabolic Panel: Recent Labs  Lab 07/10/22 1129 07/11/22 0947 07/12/22 0103  NA 136 138 136  K 3.5 2.9* 4.1  CL 101 98 98  CO2 23 29 30   GLUCOSE 142* 159* 109*  BUN 28* 26* 29*  CREATININE 2.02* 2.06* 1.98*  CALCIUM 9.1 8.9 9.1  MG  --  1.7 2.1   GFR: Estimated Creatinine Clearance: 23 mL/min (A) (by C-G formula based on SCr of 1.98 mg/dL (H)). Liver Function Tests: Recent Labs  Lab 07/11/22 0947  AST 23  ALT 15  ALKPHOS 55  BILITOT 1.4*  PROT 6.1*  ALBUMIN 3.1*   No results for input(s): "LIPASE", "AMYLASE" in the last 168 hours. No results for input(s): "AMMONIA" in the last 168 hours. Coagulation Profile: No results for input(s): "INR", "PROTIME" in the last 168 hours. Cardiac  Enzymes: No results for input(s): "CKTOTAL", "CKMB", "CKMBINDEX", "TROPONINI" in the last 168 hours. BNP (last 3 results) No results for input(s): "PROBNP" in the last 8760 hours. HbA1C: No results for input(s): "HGBA1C" in the last 72 hours. CBG: No results for input(s): "GLUCAP" in the last 168 hours. Lipid Profile: No results for input(s): "CHOL", "HDL", "LDLCALC", "TRIG", "CHOLHDL", "LDLDIRECT" in the last 72 hours. Thyroid Function Tests: No results for input(s): "TSH", "T4TOTAL", "FREET4", "T3FREE", "THYROIDAB" in the last 72 hours. Anemia Panel: No results for input(s): "VITAMINB12", "FOLATE", "FERRITIN", "TIBC", "IRON", "RETICCTPCT" in the last 72 hours. Sepsis Labs: Recent Labs  Lab 07/10/22 1202 07/10/22 1500  PROCALCITON  --  <0.10  LATICACIDVEN 1.9 1.5    Recent Results (from the past 240 hour(s))  Culture, blood (routine x 2)     Status: None (Preliminary result)   Collection Time: 07/10/22 11:52 AM   Specimen: BLOOD  Result Value Ref Range Status   Specimen Description BLOOD BLOOD RIGHT FOREARM  Final   Special Requests   Final    BOTTLES DRAWN AEROBIC AND ANAEROBIC Blood Culture results may not be optimal due to an excessive volume of blood received in culture bottles   Culture   Final    NO GROWTH 1 DAY Performed at [facility], [address] [place], [place] [address]    Report Status PENDING  Incomplete      Radiology Studies: ECHOCARDIOGRAM COMPLETE  Result Date: 07/10/2022    ECHOCARDIOGRAM REPORT   Patient Name:   Cody Thomas Date of Exam: 07/10/2022 Medical Rec #:  [id number]          Height:       64.0 in Accession #:    [id number]         Weight:       164.2 lb Date of Birth:  1931-02-25           BSA:          1.799 m Patient Age:    89 years           BP:           168/108 mmHg Patient Gender: M                  HR:           80 bpm. Exam Location:  Inpatient Procedure: 2D Echo, Color Doppler and Cardiac Doppler Indications:    acute  diastolic chf  History:        Patient has prior history of Echocardiogram examinations, most                 recent 03/19/2019. CHF, chronic kidney disease, Arrythmias:Atrial                 Fibrillation; Risk Factors:Hypertension, Sleep Apnea, Diabetes                 and Dyslipidemia.  Sonographer:    Delcie Roch RDCS  Referring Phys: [id number] [facility] IMPRESSIONS  1. Left ventricular ejection fraction, by estimation, is 40 to 45%. The left ventricle has mildly decreased function. The left ventricle demonstrates global hypokinesis. There is moderate asymmetric left ventricular hypertrophy of the basal-septal segment. Left ventricular diastolic parameters are indeterminate.  2. Right ventricular systolic function is moderately reduced. The right ventricular size is moderately enlarged. There is severely elevated pulmonary artery systolic pressure. The estimated right ventricular systolic pressure is 63.4 mmHg.  3. Left atrial size was severely dilated.  4. Right atrial size was severely dilated.  5. The mitral valve is normal in structure. Moderate mitral valve regurgitation.  6. Tricuspid valve regurgitation is moderate.  7. The aortic valve is tricuspid. Aortic valve regurgitation is trivial. Aortic valve sclerosis/calcification is present, without any evidence of aortic stenosis.  8. The inferior vena cava is dilated in size with <50% respiratory variability, suggesting right atrial pressure of 15 mmHg. FINDINGS  Left Ventricle: Left ventricular ejection fraction, by estimation, is 40 to 45%. The left ventricle has mildly decreased function. The left ventricle demonstrates global hypokinesis. The left ventricular internal cavity size was small. There is moderate  asymmetric left ventricular hypertrophy of the basal-septal segment. Left ventricular diastolic parameters are indeterminate. Right Ventricle: The right ventricular size is moderately enlarged. Right vetricular wall thickness was not well  visualized. Right ventricular systolic function is moderately reduced. There is severely elevated pulmonary artery systolic pressure. The tricuspid regurgitant velocity is 3.48 m/s, and with an assumed right atrial pressure of 15 mmHg, the estimated right ventricular systolic pressure is 63.4 mmHg. Left Atrium: Left atrial size was severely dilated. Right Atrium: Right atrial size was severely dilated. Pericardium: There is no evidence of pericardial effusion. Mitral Valve: The mitral valve is normal in structure. Moderate mitral valve regurgitation. Tricuspid Valve: The tricuspid valve is normal in structure. Tricuspid valve regurgitation is moderate. Aortic Valve: The aortic valve is tricuspid. Aortic valve regurgitation is trivial. Aortic valve sclerosis/calcification is present, without any evidence of aortic stenosis. Pulmonic Valve: The pulmonic valve was not well visualized. Pulmonic valve regurgitation is mild to moderate. Aorta: The aortic root is normal in size and structure. Venous: The inferior vena cava is dilated in size with less than 50% respiratory variability, suggesting right atrial pressure of 15 mmHg. IAS/Shunts: The interatrial septum was not well visualized.  LEFT VENTRICLE PLAX 2D LVIDd:         3.70 cm LVIDs:         2.90 cm LV PW:         1.30 cm LV IVS:        1.20 cm LVOT diam:     2.40 cm LVOT Area:     4.52 cm  RIGHT VENTRICLE          IVC RV Basal diam:  3.80 cm  IVC diam: 2.60 cm LEFT ATRIUM              Index        RIGHT ATRIUM           Index LA diam:        4.80 cm  2.67 cm/m   RA Area:     27.60 cm LA Vol (A2C):   103.0 ml 57.25 ml/m  RA Volume:   92.50 ml  51.42 ml/m LA Vol (A4C):   98.1 ml  54.53 ml/m LA Biplane Vol: 106.0 ml 58.92 ml/m   AORTA Ao Root diam: 3.60 cm Ao Asc diam:  3.30 cm TRICUSPID VALVE TR Peak grad:   48.4 mmHg TR Vmax:        348.00 cm/s  SHUNTS Systemic Diam: 2.40 cm Epifanio Lesches MD Electronically signed by Epifanio Lesches MD Signature  Date/Time: 07/10/2022/6:39:54 PM    Final    DG Chest Port 1 View  Result Date: 07/10/2022 CLINICAL DATA:  Shortness of breath. EXAM: PORTABLE CHEST 1 VIEW COMPARISON:  07/08/2022 FINDINGS: The cardio pericardial silhouette is enlarged. Vascular congestion noted with diffuse interstitial opacity suggesting edema. No substantial pleural effusion. Bones are diffusely demineralized. Telemetry leads overlie the chest. IMPRESSION: Enlargement of the cardiopericardial silhouette with vascular congestion and interstitial edema. Electronically Signed   By: Kennith Center M.D.   On: 07/10/2022 12:26    Scheduled Meds:  apixaban  2.5 mg Oral BID   furosemide  40 mg Intravenous BID   guaiFENesin  600 mg Oral BID   hydrALAZINE  25 mg Oral Q8H   potassium chloride  40 mEq Oral BID   traZODone  50 mg Oral QHS   Continuous Infusions:   LOS: 1 day   Time spent: 35 minutes   Zannie Cove, MD [facility]  If 7PM-7AM, please contact night-coverage [email or web address] 07/12/2022, 9:01 AM

## 2022-07-12 NOTE — Plan of Care (Signed)
?  Problem: Education: ?Goal: Ability to demonstrate management of disease process will improve ?Outcome: Progressing ?Goal: Ability to verbalize understanding of medication therapies will improve ?Outcome: Progressing ?  ?Problem: Activity: ?Goal: Capacity to carry out activities will improve ?Outcome: Progressing ?  ?Problem: Cardiac: ?Goal: Ability to achieve and maintain adequate cardiopulmonary perfusion will improve ?Outcome: Progressing ?  ?Problem: Education: ?Goal: Knowledge of General Education information will improve ?Description: Including pain rating scale, medication(s)/side effects and non-pharmacologic comfort measures ?Outcome: Progressing ?  ?Problem: Health Behavior/Discharge Planning: ?Goal: Ability to manage health-related needs will improve ?Outcome: Progressing ?  ?Problem: Clinical Measurements: ?Goal: Ability to maintain clinical measurements within normal limits will improve ?Outcome: Progressing ?Goal: Will remain free from infection ?Outcome: Progressing ?Goal: Diagnostic test results will improve ?Outcome: Progressing ?Goal: Respiratory complications will improve ?Outcome: Progressing ?Goal: Cardiovascular complication will be avoided ?Outcome: Progressing ?  ?Problem: Activity: ?Goal: Risk for activity intolerance will decrease ?Outcome: Progressing ?  ?

## 2022-07-12 NOTE — Plan of Care (Signed)
  Problem: Education: Goal: Ability to demonstrate management of disease process will improve 07/12/2022 1708 by Mathis Fare, RN Outcome: Progressing 07/12/2022 1701 by Mathis Fare, RN Outcome: Progressing Goal: Ability to verbalize understanding of medication therapies will improve 07/12/2022 1708 by Mathis Fare, RN Outcome: Progressing 07/12/2022 1701 by Mathis Fare, RN Outcome: Progressing   Problem: Activity: Goal: Capacity to carry out activities will improve 07/12/2022 1708 by Mathis Fare, RN Outcome: Progressing 07/12/2022 1701 by Mathis Fare, RN Outcome: Progressing   Problem: Cardiac: Goal: Ability to achieve and maintain adequate cardiopulmonary perfusion will improve 07/12/2022 1708 by Mathis Fare, RN Outcome: Progressing 07/12/2022 1701 by Mathis Fare, RN Outcome: Progressing   Problem: Education: Goal: Knowledge of General Education information will improve Description: Including pain rating scale, medication(s)/side effects and non-pharmacologic comfort measures 07/12/2022 1708 by Mathis Fare, RN Outcome: Progressing 07/12/2022 1701 by Mathis Fare, RN Outcome: Progressing   Problem: Health Behavior/Discharge Planning: Goal: Ability to manage health-related needs will improve 07/12/2022 1708 by Mathis Fare, RN Outcome: Progressing 07/12/2022 1701 by Mathis Fare, RN Outcome: Progressing   Problem: Clinical Measurements: Goal: Ability to maintain clinical measurements within normal limits will improve 07/12/2022 1708 by Mathis Fare, RN Outcome: Progressing 07/12/2022 1701 by Mathis Fare, RN Outcome: Progressing Goal: Will remain free from infection 07/12/2022 1708 by Mathis Fare, RN Outcome: Progressing 07/12/2022 1701 by Mathis Fare, RN Outcome: Progressing Goal: Diagnostic test results will improve 07/12/2022 1708 by Mathis Fare, RN Outcome: Progressing 07/12/2022 1701 by Mathis Fare, RN Outcome: Progressing Goal: Respiratory complications will improve 07/12/2022 1708 by Mathis Fare, RN Outcome: Progressing 07/12/2022 1701 by Mathis Fare, RN Outcome: Progressing Goal: Cardiovascular complication will be avoided 07/12/2022 1708 by Mathis Fare, RN Outcome: Progressing 07/12/2022 1701 by Mathis Fare, RN Outcome: Progressing   Problem: Activity: Goal: Risk for activity intolerance will decrease 07/12/2022 1708 by Mathis Fare, RN Outcome: Progressing 07/12/2022 1701 by Mathis Fare, RN Outcome: Progressing   Problem: Nutrition: Goal: Adequate nutrition will be maintained Outcome: Progressing

## 2022-07-12 NOTE — Progress Notes (Signed)
Rounding Note    Patient Name: MCKAY FITZNER Date of Encounter: 07/12/2022  [facility] Cardiologist: Donato Schultz, MD   Subjective   89 yo with hx of acute on chronic CHF  Admitted with volume overload  Echo from May 10 shows  LVEF of 40-45% Indeterminate diastolic function  Moderately reduced RV function, with mod. RV enlargement  Severe pulmonary HTN with est. PA pressure of 63. Severe RAE, severe LAE  Moderate MR .   I/O are -6.6 so far this admission    Admits to eating lots of salt. Is breathing better     Inpatient Medications    Scheduled Meds:  apixaban  2.5 mg Oral BID   furosemide  40 mg Intravenous BID   guaiFENesin  600 mg Oral BID   hydrALAZINE  25 mg Oral Q8H   potassium chloride  40 mEq Oral BID   traZODone  50 mg Oral QHS   Continuous Infusions:  PRN Meds: acetaminophen **OR** acetaminophen, albuterol, ondansetron **OR** ondansetron (ZOFRAN) IV   Vital Signs    Vitals:   07/12/22 0035 07/12/22 0500 07/12/22 0900 07/12/22 1205  BP: 113/76 106/63 111/69 127/66  Pulse: 93  88 81  Resp: 18 20 20 20   Temp: 98.3 F (36.8 C) 97.9 F (36.6 C) 97.6 F (36.4 C) (!) 97.4 F (36.3 C)  TempSrc: Axillary Axillary Axillary Oral  SpO2: 97% 94% 92% 92%  Weight:      Height:        Intake/Output Summary (Last 24 hours) at 07/12/2022 1224 Last data filed at 07/12/2022 0933 Gross per 24 hour  Intake 410 ml  Output 3150 ml  Net -2740 ml       07/11/2022    7:00 AM 07/10/2022    4:36 PM 04/15/2022    9:48 AM  Last 3 Weights  Weight (lbs) 158 lb 11.7 oz 158 lb 1.1 oz 164 lb 3.2 oz  Weight (kg) 72 kg 71.7 kg 74.481 kg      Telemetry    Atrial fib  - Personally Reviewed  ECG     - Personally Reviewed  Physical Exam   Physical Exam: Blood pressure 127/66, pulse 81, temperature (!) 97.4 F (36.3 C), temperature source Oral, resp. rate 20, height 5\' 4"  (1.626 m), weight 72 kg, SpO2 92 %.       GEN:  elderly male,  in no  acute distress HEENT: Normal NECK: No JVD; No carotid bruits LYMPHATICS: No lymphadenopathy CARDIAC: irreg. Irreg.  Soft systolic murmur  RESPIRATORY:  Clear to auscultation without rales, wheezing or rhonchi  ABDOMEN: Soft, non-tender, non-distended MUSCULOSKELETAL:  No edema; No deformity  SKIN: Warm and dry NEUROLOGIC:  Alert and oriented x 3   Labs    High Sensitivity Troponin:   Recent Labs  Lab 07/10/22 1129 07/10/22 1500  TROPONINIHS 33* 32*      Chemistry Recent Labs  Lab 07/10/22 1129 07/11/22 0947 07/12/22 0103  NA 136 138 136  K 3.5 2.9* 4.1  CL 101 98 98  CO2 23 29 30   GLUCOSE 142* 159* 109*  BUN 28* 26* 29*  CREATININE 2.02* 2.06* 1.98*  CALCIUM 9.1 8.9 9.1  MG  --  1.7 2.1  PROT  --  6.1*  --   ALBUMIN  --  3.1*  --   AST  --  23  --   ALT  --  15  --   ALKPHOS  --  55  --  BILITOT  --  1.4*  --   GFRNONAA 31* 30* 32*  ANIONGAP 12 11 8      Lipids No results for input(s): "CHOL", "TRIG", "HDL", "LABVLDL", "LDLCALC", "CHOLHDL" in the last 168 hours.  Hematology Recent Labs  Lab 07/10/22 1129 07/11/22 0947 07/12/22 0103  WBC 7.1 6.4 6.0  RBC 4.78 4.74 4.62  HGB 16.3 16.0 15.8  HCT 47.8 46.9 45.9  MCV 100.0 98.9 99.4  MCH 34.1* 33.8 34.2*  MCHC 34.1 34.1 34.4  RDW 13.7 13.6 13.5  PLT 108* 103* 98*    Thyroid No results for input(s): "TSH", "FREET4" in the last 168 hours.  BNP Recent Labs  Lab 07/10/22 1129  BNP 3,339.2*     DDimer No results for input(s): "DDIMER" in the last 168 hours.   Radiology    ECHOCARDIOGRAM COMPLETE  Result Date: 07/10/2022    ECHOCARDIOGRAM REPORT   Patient Name:   EASTON PFUHL Date of Exam: 07/10/2022 Medical Rec #:  [id number]          Height:       64.0 in Accession #:    [id number]         Weight:       164.2 lb Date of Birth:  1930/10/11           BSA:          1.799 m Patient Age:    89 years           BP:           168/108 mmHg Patient Gender: M                  HR:           80 bpm. Exam  Location:  Inpatient Procedure: 2D Echo, Color Doppler and Cardiac Doppler Indications:    acute diastolic chf  History:        Patient has prior history of Echocardiogram examinations, most                 recent 03/19/2019. CHF, chronic kidney disease, Arrythmias:Atrial                 Fibrillation; Risk Factors:Hypertension, Sleep Apnea, Diabetes                 and Dyslipidemia.  Sonographer:    Delcie Roch RDCS Referring Phys: [id number] [facility] IMPRESSIONS  1. Left ventricular ejection fraction, by estimation, is 40 to 45%. The left ventricle has mildly decreased function. The left ventricle demonstrates global hypokinesis. There is moderate asymmetric left ventricular hypertrophy of the basal-septal segment. Left ventricular diastolic parameters are indeterminate.  2. Right ventricular systolic function is moderately reduced. The right ventricular size is moderately enlarged. There is severely elevated pulmonary artery systolic pressure. The estimated right ventricular systolic pressure is 63.4 mmHg.  3. Left atrial size was severely dilated.  4. Right atrial size was severely dilated.  5. The mitral valve is normal in structure. Moderate mitral valve regurgitation.  6. Tricuspid valve regurgitation is moderate.  7. The aortic valve is tricuspid. Aortic valve regurgitation is trivial. Aortic valve sclerosis/calcification is present, without any evidence of aortic stenosis.  8. The inferior vena cava is dilated in size with <50% respiratory variability, suggesting right atrial pressure of 15 mmHg. FINDINGS  Left Ventricle: Left ventricular ejection fraction, by estimation, is 40 to 45%. The left ventricle has mildly decreased function. The left ventricle demonstrates global hypokinesis. The left  ventricular internal cavity size was small. There is moderate  asymmetric left ventricular hypertrophy of the basal-septal segment. Left ventricular diastolic parameters are indeterminate. Right Ventricle: The  right ventricular size is moderately enlarged. Right vetricular wall thickness was not well visualized. Right ventricular systolic function is moderately reduced. There is severely elevated pulmonary artery systolic pressure. The tricuspid regurgitant velocity is 3.48 m/s, and with an assumed right atrial pressure of 15 mmHg, the estimated right ventricular systolic pressure is 63.4 mmHg. Left Atrium: Left atrial size was severely dilated. Right Atrium: Right atrial size was severely dilated. Pericardium: There is no evidence of pericardial effusion. Mitral Valve: The mitral valve is normal in structure. Moderate mitral valve regurgitation. Tricuspid Valve: The tricuspid valve is normal in structure. Tricuspid valve regurgitation is moderate. Aortic Valve: The aortic valve is tricuspid. Aortic valve regurgitation is trivial. Aortic valve sclerosis/calcification is present, without any evidence of aortic stenosis. Pulmonic Valve: The pulmonic valve was not well visualized. Pulmonic valve regurgitation is mild to moderate. Aorta: The aortic root is normal in size and structure. Venous: The inferior vena cava is dilated in size with less than 50% respiratory variability, suggesting right atrial pressure of 15 mmHg. IAS/Shunts: The interatrial septum was not well visualized.  LEFT VENTRICLE PLAX 2D LVIDd:         3.70 cm LVIDs:         2.90 cm LV PW:         1.30 cm LV IVS:        1.20 cm LVOT diam:     2.40 cm LVOT Area:     4.52 cm  RIGHT VENTRICLE          IVC RV Basal diam:  3.80 cm  IVC diam: 2.60 cm LEFT ATRIUM              Index        RIGHT ATRIUM           Index LA diam:        4.80 cm  2.67 cm/m   RA Area:     27.60 cm LA Vol (A2C):   103.0 ml 57.25 ml/m  RA Volume:   92.50 ml  51.42 ml/m LA Vol (A4C):   98.1 ml  54.53 ml/m LA Biplane Vol: 106.0 ml 58.92 ml/m   AORTA Ao Root diam: 3.60 cm Ao Asc diam:  3.30 cm TRICUSPID VALVE TR Peak grad:   48.4 mmHg TR Vmax:        348.00 cm/s  SHUNTS Systemic Diam:  2.40 cm Epifanio Lesches MD Electronically signed by Epifanio Lesches MD Signature Date/Time: 07/10/2022/6:39:54 PM    Final     Cardiac Studies      Patient Profile     89 y.o. male    Assessment & Plan     1. Acute on chronic combined CHF:   EF is 40-45%.   Has severe MR and TR .  Will change lasix to Torsemide 40 mg a day Has kdur written  I suspect he will need Kdur 10 BID at DC   He says he is close to baseline .  He should be able to go home soon      2. HTN:   BP has improved with diuresis ( actually a bit on the low side )  following .    3.  Atrial fib :  cont eliquis ,  HR is well controlled.  For questions or updates, please contact [facility] Please consult [email or web address] for contact info under        Signed, Kristeen Miss, MD  07/12/2022, 12:24 PM

## 2022-07-12 NOTE — Evaluation (Signed)
Occupational Therapy Evaluation Patient Details Name: Cody Thomas MRN: [id number] DOB: 1930-12-11 Today's Date: 07/12/2022   History of Present Illness Pt is an 89 y.o. male who presented 07/10/22 with SOB and cough. Admitted with acute hypoxemic respiratory failure in the setting of acute systolic congestive heart failure. PMH: CAD s/p CABG, s/p DES LAD, permanent A-fib on Eliquis, DM2, GERD, gout, HLD, HTN, OSA on CPAP, CKD stage IIIb, cancer   Clinical Impression   PTA, pt was living his great granddaughter and performing ADLs and light IADLs; enjoys bowling. Daughter close by and assists with IADLs and medication management. Pt demonstrating decreased balance and activity tolerance compared to PLOF. Very agreeable to therapy and motivated to return home and reach PLOF. SpO2 dropping to 88% on RA and requiring 2L O2 to maintain SpO2 90-93% with activity. Pt would benefit from further acute OT to facilitate safe dc. Recommend dc to home with HHOT for further OT to optimize safety, independence with ADLs, and return to PLOF.       Recommendations for follow up therapy are one component of a multi-disciplinary discharge planning process, led by the attending physician.  Recommendations may be updated based on patient status, additional functional criteria and insurance authorization.   Assistance Recommended at Discharge Frequent or constant Supervision/Assistance  Patient can return home with the following A little help with walking and/or transfers;A little help with bathing/dressing/bathroom;Assistance with cooking/housework    Functional Status Assessment  Patient has had a recent decline in their functional status and demonstrates the ability to make significant improvements in function in a reasonable and predictable amount of time.  Equipment Recommendations  None recommended by OT (Recommending a shower seat vs tub bench; daughter planning on researching prices and sizes to fit his  bathroom)    Recommendations for Other Services PT consult     Precautions / Restrictions Precautions Precautions: Fall;Other (comment) Precaution Comments: watch SpO2 Restrictions Weight Bearing Restrictions: No      Mobility Bed Mobility               General bed mobility comments: Pt sitting in recliner upon arrival.    Transfers Overall transfer level: Needs assistance Equipment used: None Transfers: Sit to/from Stand Sit to Stand: Min guard           General transfer comment: Min guard assist for safety with pt pushing up from recliner armrests to stand, no LOB      Balance Overall balance assessment: Mild deficits observed, not formally tested                                         ADL either performed or assessed with clinical judgement   ADL Overall ADL's : Needs assistance/impaired Eating/Feeding: Set up;Sitting   Grooming: Set up;Supervision/safety;Standing   Upper Body Bathing: Set up;Supervision/ safety;Sitting   Lower Body Bathing: Min guard;Sit to/from stand   Upper Body Dressing : Set up;Supervision/safety;Sitting   Lower Body Dressing: Min guard;Sit to/from stand   Toilet Transfer: Min guard;Ambulation           Functional mobility during ADLs: Min guard;Rolling walker (2 wheels) General ADL Comments: Pt performing ADLs and functional mobiltiy at [employer] A level     Vision Baseline Vision/History: 1 Wears glasses       Perception     Praxis      Pertinent Vitals/Pain Pain Assessment Pain  Assessment: Faces Faces Pain Scale: Hurts a little bit Pain Location: chronic L knee Pain Descriptors / Indicators: Discomfort, Guarding     Hand Dominance     Extremity/Trunk Assessment Upper Extremity Assessment Upper Extremity Assessment: Overall WFL for tasks assessed   Lower Extremity Assessment Lower Extremity Assessment: Defer to PT evaluation LLE Deficits / Details: hx of a "bad" knee    Cervical / Trunk Assessment Cervical / Trunk Assessment: Kyphotic   Communication Communication Communication: HOH   Cognition Arousal/Alertness: Awake/alert Behavior During Therapy: WFL for tasks assessed/performed Overall Cognitive Status: Within Functional Limits for tasks assessed                                       General Comments  Providing education on use of shower seated. Daughter plans on checking size of bathroom for possible use of tub transfer bench. Patient reports he thinks the bathroom may be too small for a bench    Exercises     Shoulder Instructions      Home Living Family/patient expects to be discharged to:: Private residence Living Arrangements: Other relatives (great granddaughter) Available Help at Discharge: Family;Available 24 hours/day Type of Home: House Home Access: Stairs to enter [employer] of Steps: 3 Entrance Stairs-Rails: Right Home Layout: One level     Bathroom Shower/Tub: Chief Strategy Officer:  (comfort height)     Home Equipment: Rollator (4 wheels);Other (comment);Cane - quad;Cane - single point (upright 4-wheeled walker; adjustable bed)   Additional Comments: No O2 at baseline      Prior Functioning/Environment Prior Level of Function : Needs assist             Mobility Comments: Mod I, furniture surfs ADLs Comments: Does light household chores and cooking only; daughter assists in managing finances; pt manages his meds, but reports he misses some intermittently; drives; independent with all other ADLs        OT Problem List: Decreased strength;Decreased range of motion;Decreased activity tolerance;Impaired balance (sitting and/or standing);Decreased knowledge of use of DME or AE;Decreased knowledge of precautions      OT Treatment/Interventions: Self-care/ADL training;Therapeutic exercise;Energy conservation;DME and/or AE instruction;Therapeutic activities;Patient/family  education;Balance training    OT Goals(Current goals can be found in the care plan section) Acute Rehab OT Goals Patient Stated Goal: Go home OT Goal Formulation: With patient/family Time For Goal Achievement: 07/26/22 Potential to Achieve Goals: Good  OT Frequency: Min 3X/week    Co-evaluation              AM-PAC OT "6 Clicks" Daily Activity     Outcome Measure Help from another person eating meals?: None Help from another person taking care of personal grooming?: A Little Help from another person toileting, which includes using toliet, bedpan, or urinal?: A Little Help from another person bathing (including washing, rinsing, drying)?: A Little Help from another person to put on and taking off regular upper body clothing?: A Little Help from another person to put on and taking off regular lower body clothing?: A Little 6 Click Score: 19   End of Session Equipment Utilized During Treatment: Gait belt;Rolling walker (2 wheels);Oxygen (2L) Nurse Communication: Mobility status  Activity Tolerance: Patient tolerated treatment well Patient left: in chair;with call bell/phone within reach;with family/visitor present  OT Visit Diagnosis: Unsteadiness on feet (R26.81);Other abnormalities of gait and mobility (R26.89);Muscle weakness (generalized) (M62.81)  Time: 1610-9604 OT Time Calculation (min): 30 min Charges:  OT General Charges $OT Visit: 1 Visit OT Evaluation $OT Eval Moderate Complexity: 1 Mod OT Treatments $Self Care/Home Management : 8-22 mins  Cody Thomas MSOT, OTR/L Acute Rehab Office: [phone]   Theodoro Grist Miliana Gangwer 07/12/2022, 2:49 PM

## 2022-07-12 NOTE — Plan of Care (Signed)

## 2022-07-13 ENCOUNTER — Other Ambulatory Visit (HOSPITAL_COMMUNITY): Payer: Self-pay

## 2022-07-13 ENCOUNTER — Encounter (HOSPITAL_COMMUNITY): Payer: Self-pay | Admitting: Internal Medicine

## 2022-07-13 DIAGNOSIS — I25810 Atherosclerosis of coronary artery bypass graft(s) without angina pectoris: Secondary | ICD-10-CM

## 2022-07-13 DIAGNOSIS — I5043 Acute on chronic combined systolic (congestive) and diastolic (congestive) heart failure: Secondary | ICD-10-CM | POA: Diagnosis not present

## 2022-07-13 DIAGNOSIS — I5023 Acute on chronic systolic (congestive) heart failure: Secondary | ICD-10-CM

## 2022-07-13 MED ORDER — ISOSORBIDE MONONITRATE ER 30 MG PO TB24
30.0000 mg | ORAL_TABLET | Freq: Every day | ORAL | Status: DC
  Administered 2022-07-14: 30 mg via ORAL
  Filled 2022-07-13 (×2): qty 1

## 2022-07-13 MED ORDER — FUROSEMIDE 40 MG PO TABS
40.0000 mg | ORAL_TABLET | Freq: Every day | ORAL | Status: DC
  Administered 2022-07-13 – 2022-07-14 (×2): 40 mg via ORAL
  Filled 2022-07-13 (×2): qty 1

## 2022-07-13 NOTE — Progress Notes (Addendum)
Rounding Note    Patient Name: Cody Thomas Date of Encounter: 07/13/2022  [facility] Cardiologist: Donato Schultz, MD   Subjective   Breathing comfortably.  No residual edema.  Creatinine stable, but above baseline  Inpatient Medications    Scheduled Meds:  apixaban  2.5 mg Oral BID   furosemide  40 mg Intravenous BID   guaiFENesin  600 mg Oral BID   hydrALAZINE  25 mg Oral Q8H   traZODone  50 mg Oral QHS   Continuous Infusions:  PRN Meds: acetaminophen **OR** acetaminophen, albuterol, ondansetron **OR** ondansetron (ZOFRAN) IV   Vital Signs    Vitals:   07/13/22 0040 07/13/22 0500 07/13/22 0613 07/13/22 0717  BP: 104/62  116/73 96/61  Pulse: 78  93 75  Resp: 18  16 18   Temp: 98.7 F (37.1 C)  98.5 F (36.9 C) 98 F (36.7 C)  TempSrc: Oral  Oral Oral  SpO2: 96%  97% 92%  Weight: 68.4 kg 68.4 kg    Height:        Intake/Output Summary (Last 24 hours) at 07/13/2022 0859 Last data filed at 07/13/2022 0825 Gross per 24 hour  Intake 1260 ml  Output 2500 ml  Net -1240 ml      07/13/2022    5:00 AM 07/13/2022   12:40 AM 07/11/2022    7:00 AM  Last 3 Weights  Weight (lbs) 150 lb 12.7 oz 150 lb 12.7 oz 158 lb 11.7 oz  Weight (kg) 68.4 kg 68.4 kg 72 kg      Telemetry    Rate controlled atrial fibrillation with occasional PVCs- Personally Reviewed  ECG    No new tracing- Personally Reviewed  Physical Exam  Very hard of hearing, but appears alert and oriented GEN: No acute distress.   Neck: No JVD Cardiac: Irregular, 2/6 holosystolic murmur heard best at the left lower sternal border,, less obvious at the apex, no diastolic murmurs, rubs, or gallops.  Respiratory: Clear to auscultation bilaterally. GI: Soft, nontender, non-distended  MS: No edema; No deformity. Neuro:  Nonfocal  Psych: Normal affect   Labs    High Sensitivity Troponin:   Recent Labs  Lab 07/10/22 1129 07/10/22 1500  TROPONINIHS 33* 32*     Chemistry Recent  Labs  Lab 07/10/22 1129 07/11/22 0947 07/12/22 0103  NA 136 138 136  K 3.5 2.9* 4.1  CL 101 98 98  CO2 23 29 30   GLUCOSE 142* 159* 109*  BUN 28* 26* 29*  CREATININE 2.02* 2.06* 1.98*  CALCIUM 9.1 8.9 9.1  MG  --  1.7 2.1  PROT  --  6.1*  --   ALBUMIN  --  3.1*  --   AST  --  23  --   ALT  --  15  --   ALKPHOS  --  55  --   BILITOT  --  1.4*  --   GFRNONAA 31* 30* 32*  ANIONGAP 12 11 8     Lipids No results for input(s): "CHOL", "TRIG", "HDL", "LABVLDL", "LDLCALC", "CHOLHDL" in the last 168 hours.  Hematology Recent Labs  Lab 07/10/22 1129 07/11/22 0947 07/12/22 0103  WBC 7.1 6.4 6.0  RBC 4.78 4.74 4.62  HGB 16.3 16.0 15.8  HCT 47.8 46.9 45.9  MCV 100.0 98.9 99.4  MCH 34.1* 33.8 34.2*  MCHC 34.1 34.1 34.4  RDW 13.7 13.6 13.5  PLT 108* 103* 98*   Thyroid No results for input(s): "TSH", "FREET4" in the last 168 hours.  BNP Recent Labs  Lab 07/10/22 1129  BNP 3,339.2*    DDimer No results for input(s): "DDIMER" in the last 168 hours.   Radiology    No results found.  Cardiac Studies  Echocardiogram 07/10/2022   1. Left ventricular ejection fraction, by estimation, is 40 to 45%. The  left ventricle has mildly decreased function. The left ventricle  demonstrates global hypokinesis. There is moderate asymmetric left  ventricular hypertrophy of the basal-septal  segment. Left ventricular diastolic parameters are indeterminate.   2. Right ventricular systolic function is moderately reduced. The right  ventricular size is moderately enlarged. There is severely elevated  pulmonary artery systolic pressure. The estimated right ventricular  systolic pressure is 63.4 mmHg.   3. Left atrial size was severely dilated.   4. Right atrial size was severely dilated.   5. The mitral valve is normal in structure. Moderate mitral valve  regurgitation.   6. Tricuspid valve regurgitation is moderate.   7. The aortic valve is tricuspid. Aortic valve regurgitation is  trivial.  Aortic valve sclerosis/calcification is present, without any evidence of  aortic stenosis.   8. The inferior vena cava is dilated in size with <50% respiratory  variability, suggesting right atrial pressure of 15 mmHg.    Patient Profile     89 y.o. male with acute on chronic systolic heart failure with biventricular (right greater than left) manifestations, spontaneously rate controlled permanent atrial fibrillation, frequent PVCs, previous CABG for CAD in 2013  Assessment & Plan    Appears clinically euvolemic.  Net diuresis of 7 L during this admission.  Slight acute kidney injury, but stable over the last 3 days, no labs available today. On chronic anticoagulation with apixaban, appropriately adjusted for age and renal dysfunction.  Appropriately rate controlled.  History of some problems with bradycardia/pauses so would avoid any additional beta-blocker.  Receiving angiotensin receptor blocker.  Would not plan to transition to [facility] or start aldosterone antagonist, until renal function has stabilized.  Similarly would hold off on SGLT2 inhibitor until outpatient follow-up.  Add low-dose long-acting nitrate for balanced vasodilation with hydralazine. Intolerant to statins (has tried atorvastatin, simvastatin, rosuvastatin) and ezetimibe.  Will start PCSK9 inhibitor as an outpatient.   [facility] will sign off.   Medication Recommendations:   Apixaban 2.5 mg twice daily Losartan-HCTZ 100-25 mg once daily Metoprolol 25 mg twice daily Hydralazine 25 mg 3 times daily Isosorbide mononitrate 30 mg once daily Other recommendations (labs, testing, etc):  Will need early repeat basic metabolic panel in 1 week.  Sodium restricted diet.  Daily weight measurements, call if gains weight 3 pounds in 24 hours or 5 pounds in 1 week. Follow up as an outpatient: Will need early heart failure transition of care follow-up, preferably in less than 2 weeks.  For questions or  updates, please contact [facility] Please consult [email or web address] for contact info under        Signed, Thurmon Fair, MD  07/13/2022, 8:59 AM

## 2022-07-13 NOTE — Progress Notes (Signed)
   Heart Failure Stewardship Pharmacist Progress Note   PCP: Gweneth Dimitri, MD PCP-Cardiologist: Donato Schultz, MD    HPI:  89 yo M with PMH of CAD s/p CABG, afib, T2DM, GERD, gout, HLD, HTN, OSA on CPAP, and CKD IIIb.   Presented to the ED on 5/10 with shortness of breath and cough. He was recently seen by his PCP and was started on augmentin for possible PNA. Advised to go to the ED when his BNP was elevated. Patient also complains of increased LE edema. CXR with cardiac enlargement and vascular congestion with interstitial edema. ECHO 5/10 showed LVEF 40-45%, global hypokinesis, moderate LVH, RV moderately reduced, severely elevated PAP, and moderate MVR. Admits to dietary noncompliance.   Current HF Medications: Diuretic: furosemide 40 mg PO daily Other: hydralazine 25 mg TID + Imdur 30 mg daily  Prior to admission HF Medications: Beta blocker: metoprolol tartrate 25 mg BID ACE/ARB/ARNI: losartan 100 mg daily  Pertinent Lab Values: Serum creatinine 1.98, BUN 29, Potassium 4.1, Sodium 136, BNP 3339.2, Magnesium 2.1, A1c 6.2   Vital Signs: Weight: 150 lbs (admission weight: 158 lbs) Blood pressure: 90-100/60s  Heart rate: 70-80s  I/O: -1.5L yesterday; net -6.8L  Medication Assistance / Insurance Benefits Check: Does the patient have prescription insurance?  Yes  Type of insurance plan: [facility] commercial insurance  Outpatient Pharmacy:  Prior to admission [facility] Is the patient willing to use [facility] pharmacy at discharge? Yes Is the patient willing to transition their outpatient pharmacy to utilize a [facility] outpatient pharmacy?   No    Assessment: 1. Acute on chronic systolic and diastolic CHF (LVEF 40-45%). NYHA class II symptoms. - Off IV lasix, continue furosemide 40 mg PO daily. Strict I/Os and daily weights. Keep K>4 and Mg>2. - Consider restarting metoprolol tartrate 25 mg BID for afib at follow up - has had bradycardia with pauses this admission,  holding off for now. - Holding ARB with low BP - No MRA with CKD IIIb. CrCl <30.  - No SGLT2i at this time with unstable renal function and  - Continue hydralazine 25 mg TID + Imdur 30 mg daily   Plan: 1) Medication changes recommended at this time: - Restart losartan if BP improves  2) Patient assistance: - Still has active Nurse, learning disability through previous employer - States his insurance will be changing soon - Farxiga copay at this time $60 - copay card lowers to $0 - Jardiance copay at this time $60 - copay card lowers to $10   3)  Education  - Patient has been educated on current HF medications and potential additions to HF medication regimen - Patient verbalizes understanding that over the next few months, these medication doses may change and more medications may be added to optimize HF regimen - Patient has been educated on basic disease state pathophysiology and goals of therapy   Sharen Hones, PharmD, BCPS Heart Failure Engineer, building services Phone [phone]

## 2022-07-13 NOTE — Progress Notes (Signed)
Physical Therapy Treatment Patient Details Name: Cody Thomas MRN: [id number] DOB: 02/22/1931 Today's Date: 07/13/2022   History of Present Illness Pt is an 89 y.o. male who presented 07/10/22 with SOB and cough. Admitted with acute hypoxemic respiratory failure in the setting of acute systolic congestive heart failure. PMH: CAD s/p CABG, s/p DES LAD, permanent A-fib on Eliquis, DM2, GERD, gout, HLD, HTN, OSA on CPAP, CKD stage IIIb, cancer    PT Comments    Pt tolerated treatment well today. Pt was able to ambulate in hallway with RW and navigate 3 stairs at min guard level. Pt received on 1L at 93%. Pt dropped to 87% on RA during ambulation requiring 2L to recover to 91%. Pt left on 1L at 91%. Pt typically ambulates with no AD at baseline however educated that pt needs RW at current level. Pt now agreeable to HHPT after initially refusing further PT from previous PT. Pt and family anticipate DC home tomorrow.  Recommendations for follow up therapy are one component of a multi-disciplinary discharge planning process, led by the attending physician.  Recommendations may be updated based on patient status, additional functional criteria and insurance authorization.  Follow Up Recommendations       Assistance Recommended at Discharge Intermittent Supervision/Assistance  Patient can return home with the following A little help with walking and/or transfers;A little help with bathing/dressing/bathroom;Assistance with cooking/housework;Direct supervision/assist for financial management;Direct supervision/assist for medications management;Assist for transportation;Help with stairs or ramp for entrance   Equipment Recommendations  None recommended by PT    Recommendations for Other Services       Precautions / Restrictions Precautions Precautions: Fall;Other (comment) Precaution Comments: watch SpO2 Restrictions Weight Bearing Restrictions: No     Mobility  Bed Mobility                General bed mobility comments: Pt sitting in recliner upon arrival.    Transfers Overall transfer level: Needs assistance Equipment used: None Transfers: Sit to/from Stand Sit to Stand: Supervision                Ambulation/Gait Ambulation/Gait assistance: Min guard Gait Distance (Feet): 200 Feet Assistive device: None, Rolling walker (2 wheels) Gait Pattern/deviations: Step-through pattern, Decreased stride length, Antalgic, Drifts right/left, Decreased dorsiflexion - right, Decreased dorsiflexion - left Gait velocity: reduced     General Gait Details: Pt ambulates with slow antalgic gait initally without AD however transitioned to RW and demonstrated much smoother and steadier gait pattern. 1 LOB requiring Min A to correct.   Stairs Stairs: Yes Stairs assistance: Min guard Stair Management: One rail Right, Alternating pattern, Forwards Number of Stairs: 3 General stair comments: no LOB noted.   Wheelchair Mobility    Modified Rankin (Stroke Patients Only)       Balance Overall balance assessment: Mild deficits observed, not formally tested                                          Cognition Arousal/Alertness: Awake/alert Behavior During Therapy: WFL for tasks assessed/performed Overall Cognitive Status: Within Functional Limits for tasks assessed                                          Exercises      General Comments General comments (skin  integrity, edema, etc.): Pt received on 1L at 93%. Pt dropped to 87% on RA during ambulation requiring 2L to recover to 91%. Pt left on 1L at 91%.      Pertinent Vitals/Pain Pain Assessment Pain Assessment: No/denies pain    Home Living                          Prior Function            PT Goals (current goals can now be found in the care plan section) Progress towards PT goals: Progressing toward goals    Frequency    Min 1X/week      PT Plan  Discharge plan needs to be updated    Co-evaluation              AM-PAC PT "6 Clicks" Mobility   Outcome Measure  Help needed turning from your back to your side while in a flat bed without using bedrails?: None Help needed moving from lying on your back to sitting on the side of a flat bed without using bedrails?: None Help needed moving to and from a bed to a chair (including a wheelchair)?: A Little Help needed standing up from a chair using your arms (e.g., wheelchair or bedside chair)?: A Little Help needed to walk in hospital room?: A Little Help needed climbing 3-5 steps with a railing? : A Little 6 Click Score: 20    End of Session Equipment Utilized During Treatment: Gait belt;Oxygen Activity Tolerance: Patient tolerated treatment well Patient left: in chair;with call bell/phone within reach;with chair alarm set;with family/visitor present Nurse Communication: Mobility status;Other (comment) (O2 sats) PT Visit Diagnosis: Unsteadiness on feet (R26.81);Other abnormalities of gait and mobility (R26.89);Muscle weakness (generalized) (M62.81);Difficulty in walking, not elsewhere classified (R26.2)     Time: 1610-9604 PT Time Calculation (min) (ACUTE ONLY): 16 min  Charges:  $Gait Training: 8-22 mins                     Shela Nevin, PT, DPT Acute Rehab Services [phone]    Gladys Damme 07/13/2022, 3:50 PM

## 2022-07-13 NOTE — Progress Notes (Signed)
PROGRESS NOTE    Cody Thomas  WGN:[id number] DOB: 1930-07-10 DOA: 07/10/2022 PCP: Gweneth Dimitri, MD   Brief Narrative:  [phone] with CAD, CABG, followed by DES to LAD, permanent A-fib on Eliquis, diabetes mellitus type 2, GERD, gout, hyperlipidemia, hypertension, OSA on CPAP, COVID-19 pneumonia, CKD stage IIIb presented with worsening shortness of breath and cough for the last week.  -In the ED he was hypoxic, BNP> 3K, creatinine 2.0, troponin 33, chest x-ray with pulmonary vascular congestion and interstitial edema  Assessment & Plan:   Acute hypoxemic respiratory failure Acute on chronic systolic CHF, moderate MR Severe pulmonary hypertension -Echo this admit noted EF of 40-45%, moderately reduced RV, moderate MR, severely elevated PA systolic pressures -Cards following, diuresed well on IV Lasix he is 7 L negative, changed to oral Lasix today, creatinine stable 1.9-2 range -Restart metoprolol when blood pressure is more stable -Attempt to wean off O2 today -GDMT limited by CKD -Discharge planning, home tomorrow if stable   CAD with history of CABG and stenting Elevated troponin: -Stable.  No chest pain.  Held losartan-HCTZ w/ AKI.  -Metoprolol resumed  Hypertension: Stable.   -Continue to hold losartan-hydrochlorothiazide  -Holding metoprolol with low BP   AKI on CKD stage IIIb -Unknown recent baseline creatinine; prior baseline around 1.4-1.6.  Creatinine 2.06  -Continue diuresis.  BMP in a.m.  Hypokalemia: Hypomagnesemia: Replenished   Permanent A-fib -Currently rate controlled.  Continue Eliquis.  Hold on metoprolol due to bradycardia.   Thrombocytopenia -Mild, monitor  Diabetes mellitus type 2 -On Prandin at home.  Hold Prandin.  Check CBGs with SSI.   Physical deconditioning -PT/OT evaluation pending  Hyperlipidemia: Continue statin  DVT prophylaxis: Eliquis Code Status: DNR Family Communication: Discussed with patient in detail, no family at  bedside Disposition Plan: Home tomorrow  Consultants:  Cardiology     Subjective: -Feels better overall, breathing is improving  Objective: Vitals:   07/13/22 0613 07/13/22 0717 07/13/22 0922 07/13/22 0929  BP: 116/73 96/61 (!) 94/45 90/60  Pulse: 93 75 74 80  Resp: 16 18    Temp: 98.5 F (36.9 C) 98 F (36.7 C)    TempSrc: Oral Oral    SpO2: 97% 92% 94%   Weight:      Height:        Intake/Output Summary (Last 24 hours) at 07/13/2022 1022 Last data filed at 07/13/2022 0825 Gross per 24 hour  Intake 1020 ml  Output 1400 ml  Net -380 ml   Filed Weights   07/11/22 0700 07/13/22 0040 07/13/22 0500  Weight: 72 kg 68.4 kg 68.4 kg    Examination:  General exam: Pleasant elderly male sitting up in bed, AAO x 3, mild cognitive deficits, hard of hearing HEENT: Neck obese unable to assess JVD CVS: S1-S2, irregular rhythm, soft systolic murmur  Lungs: Poor air movement bilaterally otherwise clear Abdomen: Soft, nontender, bowel sounds present Extremities: Trace edema Psych: Flat affect, poor insight and judgment   Data Reviewed: I have personally reviewed following labs and imaging studies  CBC: Recent Labs  Lab 07/10/22 1129 07/11/22 0947 07/12/22 0103  WBC 7.1 6.4 6.0  HGB 16.3 16.0 15.8  HCT 47.8 46.9 45.9  MCV 100.0 98.9 99.4  PLT 108* 103* 98*   Basic Metabolic Panel: Recent Labs  Lab 07/10/22 1129 07/11/22 0947 07/12/22 0103  NA 136 138 136  K 3.5 2.9* 4.1  CL 101 98 98  CO2 23 29 30   GLUCOSE 142* 159* 109*  BUN 28* 26*  29*  CREATININE 2.02* 2.06* 1.98*  CALCIUM 9.1 8.9 9.1  MG  --  1.7 2.1   GFR: Estimated Creatinine Clearance: 21.2 mL/min (A) (by C-G formula based on SCr of 1.98 mg/dL (H)). Liver Function Tests: Recent Labs  Lab 07/11/22 0947  AST 23  ALT 15  ALKPHOS 55  BILITOT 1.4*  PROT 6.1*  ALBUMIN 3.1*   No results for input(s): "LIPASE", "AMYLASE" in the last 168 hours. No results for input(s): "AMMONIA" in the last 168  hours. Coagulation Profile: No results for input(s): "INR", "PROTIME" in the last 168 hours. Cardiac Enzymes: No results for input(s): "CKTOTAL", "CKMB", "CKMBINDEX", "TROPONINI" in the last 168 hours. BNP (last 3 results) No results for input(s): "PROBNP" in the last 8760 hours. HbA1C: No results for input(s): "HGBA1C" in the last 72 hours. CBG: No results for input(s): "GLUCAP" in the last 168 hours. Lipid Profile: No results for input(s): "CHOL", "HDL", "LDLCALC", "TRIG", "CHOLHDL", "LDLDIRECT" in the last 72 hours. Thyroid Function Tests: No results for input(s): "TSH", "T4TOTAL", "FREET4", "T3FREE", "THYROIDAB" in the last 72 hours. Anemia Panel: No results for input(s): "VITAMINB12", "FOLATE", "FERRITIN", "TIBC", "IRON", "RETICCTPCT" in the last 72 hours. Sepsis Labs: Recent Labs  Lab 07/10/22 1202 07/10/22 1500  PROCALCITON  --  <0.10  LATICACIDVEN 1.9 1.5    Recent Results (from the past 240 hour(s))  Culture, blood (routine x 2)     Status: None (Preliminary result)   Collection Time: 07/10/22 11:52 AM   Specimen: BLOOD  Result Value Ref Range Status   Specimen Description BLOOD BLOOD RIGHT FOREARM  Final   Special Requests   Final    BOTTLES DRAWN AEROBIC AND ANAEROBIC Blood Culture results may not be optimal due to an excessive volume of blood received in culture bottles   Culture   Final    NO GROWTH 3 DAYS Performed at [facility], [address] [place], [place] [address]    Report Status PENDING  Incomplete      Radiology Studies: No results found.  Scheduled Meds:  apixaban  2.5 mg Oral BID   furosemide  40 mg Intravenous BID   guaiFENesin  600 mg Oral BID   hydrALAZINE  25 mg Oral Q8H   isosorbide mononitrate  30 mg Oral Daily   traZODone  50 mg Oral QHS   Continuous Infusions:   LOS: 2 days   Time spent: 35 minutes   Zannie Cove, MD [facility]  If 7PM-7AM, please contact night-coverage [email or web address] 07/13/2022, 10:22  AM

## 2022-07-13 NOTE — Progress Notes (Signed)
Heart Failure Nurse Navigator Progress Note  PCP: Gweneth Dimitri, MD PCP-Cardiologist: Anne Fu Admission Diagnosis: Dyspnea Admitted from: Home  Presentation:   Cody Thomas presented with cough and shortness of breath for 5 days, Recently treated for pneumonia, called by PCP that his BNP was elevated and should go to the ED. Upon arrival found to be hypoxic with says in the mid 80's on RA, placed O2 on 3 L with nasal cannula. Complaints of BLE edema, BNP 3,339, CXR with cardiac enlargement and vascular congestion with interstitial edema.   Patient was educated on the sign and symptoms of heart failure, daily weights, when to call his doctor or go to the ED. Diet/ fluid restrictions ( patient reported that he lives with his [place] daughter and he eats fast food every day for meals, usually [employer] or [employer] ) Educated on taking all medications as prescribed and attending all medical appointments, patient verbalized his understanding. A HF TOC appointment was scheduled for 07/30/2022 @ 3 pm.   ECHO/ LVEF: 40-45%  Clinical Course:  Past Medical History:  Diagnosis Date   Anxiety    Bunion 09/29/2011   Cancer (HCC)    nose/Dr. Albertini;skin   Chronic constipation    Chronic kidney disease    stage 111   Coronary atherosclerosis of native coronary artery    2009 LAD CIRC DES   Depression    doesn't take any meds for this   Diabetes mellitus    takes Januvia daily   Dizziness    Enlarged prostate    but doesn't require meds at present   GERD (gastroesophageal reflux disease)    TUMS prn   H/O hiatal hernia    Headache(784.0)    History of gout    doesn't require meds   Hx of cardiac cath    Hyperlipidemia    Crestor 3 x wk and Zetia daily   Hypertension    takes Hyzaar daily   Insomnia    doesn't require meds at present time   Joint pain    Onychomycosis 10/05/2012   x 10   PONV (postoperative nausea and vomiting)    pt states extremely sick    Shortness of breath    with exertion     Social History   Socioeconomic History   Marital status: Widowed    Spouse name: Not on file   Number of children: 2   Years of education: HS   Highest education level: Not on file  Occupational History   Occupation: Retired  Tobacco Use   Smoking status: Former    Packs/day: 1.50    Years: 30.00    Additional pack years: 0.00    Total pack years: 45.00    Types: Cigarettes    Quit date: 03/02/1984    Years since quitting: 38.3   Smokeless tobacco: Never  Vaping Use   Vaping Use: Never used  Substance and Sexual Activity   Alcohol use: No    Alcohol/week: 0.0 standard drinks of alcohol   Drug use: No   Sexual activity: Not Currently  Other Topics Concern   Not on file  Social History Narrative   Lives at home with his wife and great-granddaughter.   Right-handed.   Occasional caffeine use.   Social Determinants of Health   Financial Resource Strain: Low Risk  (07/13/2022)   Overall Financial Resource Strain (CARDIA)    Difficulty of Paying Living Expenses: Not hard at all  Food Insecurity: No Food Insecurity (  07/10/2022)   Hunger Vital Sign    Worried About Running Out of Food in the Last Year: Never true    Ran Out of Food in the Last Year: Never true  Transportation Needs: No Transportation Needs (07/13/2022)   PRAPARE - Administrator, Civil Service (Medical): No    Lack of Transportation (Non-Medical): No  Physical Activity: Not on file  Stress: Not on file  Social Connections: Not on file   Education Assessment and Provision:  Detailed education and instructions provided on heart failure disease management including the following:  Signs and symptoms of Heart Failure When to call the physician Importance of daily weights Low sodium diet Fluid restriction Medication management Anticipated future follow-up appointments  Patient education given on each of the above topics.  Patient acknowledges  understanding via teach back method and acceptance of all instructions.  Education Materials:  "Living Better With Heart Failure" Booklet, HF zone tool, & Daily Weight Tracker Tool.  Patient has scale at home: Yes Patient has pill box at home: NA    High Risk Criteria for Readmission and/or Poor Patient Outcomes: Heart failure hospital admissions (last 6 months): 0  No Show rate: 0 Difficult social situation: No, lives with [place] Granddaughter Demonstrates medication adherence: yes Primary Language: English Literacy level: reading, writing, and comprehension  Barriers of Care:   Diet/ fluid restrictions ( Fast food everyday)  Daily weights   Considerations/Referrals:   Referral made to Heart Failure Pharmacist Stewardship: Yes Referral made to Heart Failure CSW/NCM TOC: No Referral made to Heart & Vascular TOC clinic: Yes, 07/30/2022 @ 3 pm.   Items for Follow-up on DC/TOC: Diet/ fluid restrictions ( eats fast food every day)  Daily weights  Continued HF Education   Rhae Hammock, BSN, RN Heart Failure Teacher, adult education Only

## 2022-07-13 NOTE — Progress Notes (Addendum)
SATURATION QUALIFICATIONS: (This note is used to comply with regulatory documentation for home oxygen)  Patient Saturations on Room Air at Rest = 92%  Patient Saturations on Room Air while Ambulating = 87%  Patient Saturations on 2 Liters of oxygen while Ambulating = 91%  Please briefly explain why patient needs home oxygen: 

## 2022-07-14 ENCOUNTER — Other Ambulatory Visit (HOSPITAL_COMMUNITY): Payer: Self-pay

## 2022-07-14 DIAGNOSIS — I5043 Acute on chronic combined systolic (congestive) and diastolic (congestive) heart failure: Secondary | ICD-10-CM | POA: Diagnosis not present

## 2022-07-14 LAB — BASIC METABOLIC PANEL
Anion gap: 7 (ref 5–15)
BUN: 30 mg/dL — ABNORMAL HIGH (ref 8–23)
CO2: 29 mmol/L (ref 22–32)
Calcium: 9.1 mg/dL (ref 8.9–10.3)
Chloride: 97 mmol/L — ABNORMAL LOW (ref 98–111)
Creatinine, Ser: 2.05 mg/dL — ABNORMAL HIGH (ref 0.61–1.24)
GFR, Estimated: 30 mL/min — ABNORMAL LOW (ref >60)
Glucose, Bld: 100 mg/dL — ABNORMAL HIGH (ref 70–99)
Potassium: 4.2 mmol/L (ref 3.5–5.1)
Sodium: 133 mmol/L — ABNORMAL LOW (ref 135–145)

## 2022-07-14 MED ORDER — HYDRALAZINE HCL 25 MG PO TABS
25.0000 mg | ORAL_TABLET | Freq: Three times a day (TID) | ORAL | 0 refills | Status: DC
  Filled 2022-07-14: qty 90, 30d supply, fill #0

## 2022-07-14 MED ORDER — ISOSORBIDE MONONITRATE ER 30 MG PO TB24
30.0000 mg | ORAL_TABLET | Freq: Every day | ORAL | 0 refills | Status: DC
  Filled 2022-07-14: qty 30, 30d supply, fill #0

## 2022-07-14 MED ORDER — METOPROLOL TARTRATE 25 MG PO TABS
25.0000 mg | ORAL_TABLET | Freq: Two times a day (BID) | ORAL | Status: DC
  Administered 2022-07-14: 25 mg via ORAL
  Filled 2022-07-14: qty 1

## 2022-07-14 MED ORDER — FUROSEMIDE 40 MG PO TABS
40.0000 mg | ORAL_TABLET | Freq: Every day | ORAL | 0 refills | Status: DC
  Filled 2022-07-14: qty 30, 30d supply, fill #0

## 2022-07-14 NOTE — Progress Notes (Signed)
   Heart Failure Stewardship Pharmacist Progress Note   PCP: Gweneth Dimitri, MD PCP-Cardiologist: Donato Schultz, MD    HPI:  89 yo M with PMH of CAD s/p CABG, afib, T2DM, GERD, gout, HLD, HTN, OSA on CPAP, and CKD IIIb.   Presented to the ED on 5/10 with shortness of breath and cough. He was recently seen by his PCP and was started on augmentin for possible PNA. Advised to go to the ED when his BNP was elevated. Patient also complains of increased LE edema. CXR with cardiac enlargement and vascular congestion with interstitial edema. ECHO 5/10 showed LVEF 40-45%, global hypokinesis, moderate LVH, RV moderately reduced, severely elevated PAP, and moderate MVR. Admits to dietary noncompliance.   Discharge HF Medications: Diuretic: furosemide 40 mg PO daily Beta Blocker: metoprolol tartrate 25 mg BID Other: hydralazine 25 mg TID + Imdur 30 mg daily  Prior to admission HF Medications: Beta blocker: metoprolol tartrate 25 mg BID ACE/ARB/ARNI: losartan 100 mg daily  Pertinent Lab Values: Serum creatinine 2.05, BUN 30, Potassium 4.2, Sodium 133, BNP 3339.2, Magnesium 2.1, A1c 6.2   Vital Signs: Weight: 152 lbs (admission weight: 158 lbs) Blood pressure: 100-120/60s  Heart rate: 70s  I/O: net -7.1L  Medication Assistance / Insurance Benefits Check: Does the patient have prescription insurance?  Yes  Type of insurance plan: [facility] commercial insurance  Outpatient Pharmacy:  [facility] Is the patient willing to use [facility] TOC pharmacy at discharge? Yes Is the patient willing to transition their outpatient pharmacy to utilize a [facility] outpatient pharmacy?   No    Assessment: 1. Acute on chronic systolic and diastolic CHF (LVEF 40-45%). NYHA class II symptoms. - Off IV lasix, continue furosemide 40 mg PO daily. Strict I/Os and daily weights. Keep K>4 and Mg>2. - Agree with restarting metoprolol tartrate 25 mg BID - has had bradycardia with pauses this  admission, monitor. - Holding ARB with low BP - No MRA with CKD IIIb. CrCl <30.  - No SGLT2i at this time with unstable renal function  - Continue hydralazine 25 mg TID + Imdur 30 mg daily - Stop Prandin at discharge   Plan: 1) Medication changes recommended at this time: - Agree with changes, discharge today  2) Patient assistance: - Still has active commercial insurance through previous employer - States his insurance will be changing soon - Farxiga copay at this time $60 - copay card lowers to $0 - Jardiance copay at this time $60 - copay card lowers to $10   3)  Education  - Patient has been educated on current HF medications and potential additions to HF medication regimen - Patient verbalizes understanding that over the next few months, these medication doses may change and more medications may be added to optimize HF regimen - Patient has been educated on basic disease state pathophysiology and goals of therapy   Sharen Hones, PharmD, BCPS Heart Failure Engineer, building services Phone [phone]

## 2022-07-14 NOTE — TOC Transition Note (Signed)
Transition of Care [facility]) - CM/SW Discharge Note   Patient Details  Name: Cody Thomas MRN: [id number] Date of Birth: 1930-09-22  Transition of Care [facility]) CM/SW Contact:  Tom-Johnson, Hershal Coria, RN Phone Number: 07/14/2022, 12:16 PM   Clinical Narrative:     Patient is scheduled for discharge today.  Readmission Risk Assessment done. Home health info, outpatient f/u, hospital f/u and discharge instructions on AVS. Oxygen order called in to [employer] and Jermaine voiced acceptance, portable tank delivered to patient at bedside. Daughter has no preference.  Prescriptions sent to [facility] pharmacy and meds will be delivered to patient at bedside prior discharge. Daughter, Babette Relic to transport at discharge.  No further TOC needs noted.  Final next level of care: Home w Home Health Services Barriers to Discharge: Barriers Resolved   Patient Goals and CMS Choice CMS Medicare.gov Compare Post Acute Care list provided to:: Patient Choice offered to / list presented to : Patient, Adult Children (Daughter, Tammy)  Discharge Placement                  Patient to be transferred to facility by: Daughter Name of family member notified: Tammy    Discharge Plan and Services Additional resources added to the After Visit Summary for                  DME Arranged: Oxygen DME Agency: [employer] Date DME Agency Contacted: 07/14/22 Time DME Agency Contacted: 1008 Representative spoke with at DME Agency: Vaughan Basta HH Arranged: PT, RN, Disease Management HH Agency: [facility] Date [facility] Agency Contacted: 07/14/22 Time HH Agency Contacted: 1200 Representative spoke with at [facility] Agency: Becky Sax  Social Determinants of Health (SDOH) Interventions SDOH Screenings   Food Insecurity: No Food Insecurity (07/10/2022)  Housing: Low Risk  (07/13/2022)  Transportation Needs: No Transportation Needs (07/13/2022)  Utilities: Not At Risk (07/10/2022)  Alcohol Screen:  Low Risk  (07/13/2022)  Financial Resource Strain: Low Risk  (07/13/2022)  Tobacco Use: Medium Risk (07/13/2022)     Readmission Risk Interventions    07/14/2022   12:14 PM  Readmission Risk Prevention Plan  Transportation Screening Complete  PCP or Specialist Appt within 5-7 Days Complete  Home Care Screening Complete  Medication Review (RN CM) Referral to Pharmacy

## 2022-07-14 NOTE — Discharge Summary (Signed)
Physician Discharge Summary  Cody Thomas ZOX:[id number] DOB: August 15, 1930 DOA: 07/10/2022  PCP: Gweneth Dimitri, MD  Admit date: 07/10/2022 Discharge date: 07/14/2022  Time spent: 45 minutes  Recommendations for Outpatient Follow-up:  PCP in 1 week CHF TOC clinic on 5/30 Home health PT and RN   Discharge Diagnoses:  Acute hypoxic respiratory failure Acute on chronic systolic CHF Moderate mitral regurgitation Severe pulmonary hypertension AKI on CKD 3b   Essential hypertension   Permanent atrial fibrillation (HCC) Type 2 diabetes mellitus, diet controlled  Discharge Condition: Improved  Diet recommendation: Low-sodium  Filed Weights   07/13/22 0040 07/13/22 0500 07/14/22 0500  Weight: 68.4 kg 68.4 kg 69.1 kg    History of present illness:  26M with CAD, CABG, followed by DES to LAD, permanent A-fib on Eliquis, diabetes mellitus type 2, GERD, gout, hyperlipidemia, hypertension, OSA on CPAP, COVID-19 pneumonia, CKD stage IIIb presented with worsening shortness of breath and cough for the last week.  -In the ED he was hypoxic, BNP> 3K, creatinine 2.0, troponin 33, chest x-ray with pulmonary vascular congestion and interstitial edema  Hospital Course:   Acute hypoxemic respiratory failure Acute on chronic systolic CHF, moderate MR Severe pulmonary hypertension -Echo this admit noted EF of 40-45%, moderately reduced RV, moderate MR, severely elevated PA systolic pressures -Cards following, recommended conservative management with advanced age and frailty, diuresed well on IV Lasix, he is down 8 LB, creatinine stable at 2.0 -Started hydralazine and Imdur, transition to oral Lasix 40 Mg daily -Restarted metoprolol, did desat with activity, set up with 2 L home O2 at discharge -GDMT limited by CKD 4 -Discharged home in stable condition, educated on needed dietary changes, he was eating fast food almost every day prior to admission -CHF TOC clinic follow-up arranged    CAD  with history of CABG and stenting Elevated troponin: -Stable.  No chest pain.  Discontinued losartan HCTZ -Metoprolol resumed   AKI on CKD stage IIIb -Unknown recent baseline creatinine; prior baseline around 1.4-1.6.  Creatinine now 2.06  -Lasix 40 Mg daily resumed at discharge, losartan HCTZ discontinued   Hypokalemia: Hypomagnesemia: Replenished   Permanent A-fib -Currently rate controlled.  Continue Eliquis.  Resumed metoprolol    Thrombocytopenia -Mild, monitor   Diabetes mellitus type 2 -On Prandin at home.  Discontinued prandin -CBG in the 80-100 range this admission, does not require diabetic meds at this time   Physical deconditioning -PT/OT evaluation pending  Discharge Exam: Vitals:   07/14/22 0441 07/14/22 0838  BP: (!) 105/48 124/66  Pulse: 77 75  Resp: 16 18  Temp: (!) 97.4 F (36.3 C) 98.6 F (37 C)  SpO2: 93% 93%   General exam: Pleasant elderly male sitting up in bed, AAO x 3, mild cognitive deficits, hard of hearing HEENT: Neck obese unable to assess JVD CVS: S1-S2, irregular rhythm, soft systolic murmur  Lungs: Poor air movement bilaterally otherwise clear Abdomen: Soft, nontender, bowel sounds present Extremities: Trace edema Psych: Flat affect, poor insight and judgment   Discharge Instructions   Discharge Instructions     Diet - low sodium heart healthy   Complete by: As directed    Increase activity slowly   Complete by: As directed       Allergies as of 07/14/2022       Reactions   Crestor [rosuvastatin] Other (See Comments)   Nightmares    Zocor [simvastatin] Other (See Comments)   Nightmares    Zoloft [sertraline] Diarrhea  Medication List     STOP taking these medications    amoxicillin-clavulanate 875-125 MG tablet Commonly known as: AUGMENTIN   azithromycin 250 MG tablet Commonly known as: ZITHROMAX   losartan-hydrochlorothiazide 100-25 MG tablet Commonly known as: HYZAAR   repaglinide 0.5 MG  tablet Commonly known as: PRANDIN       TAKE these medications    apixaban 2.5 MG Tabs tablet Commonly known as: ELIQUIS Take 1 tablet (2.5 mg total) by mouth 2 (two) times daily.   COQ10 PO Take 100 mg by mouth every evening. Co Q 10 100mg /65mL   escitalopram 5 MG tablet Commonly known as: LEXAPRO Take 2.5 mg by mouth every evening.   FISH OIL PO Take 2 capsules by mouth every evening.   furosemide 40 MG tablet Commonly known as: LASIX Take 1 tablet (40 mg total) by mouth daily. Start taking on: Jul 15, 2022   hydrALAZINE 25 MG tablet Commonly known as: APRESOLINE Take 1 tablet (25 mg total) by mouth 3 (three) times daily.   isosorbide mononitrate 30 MG 24 hr tablet Commonly known as: IMDUR Take 1 tablet (30 mg total) by mouth daily. Start taking on: Jul 15, 2022   metoprolol tartrate 25 MG tablet Commonly known as: LOPRESSOR TAKE 1 TABLET(25 MG) BY MOUTH TWICE DAILY What changed:  how much to take how to take this when to take this additional instructions   traZODone 50 MG tablet Commonly known as: DESYREL Take 50 mg by mouth at bedtime.               Durable Medical Equipment  (From admission, onward)           Start     Ordered   07/14/22 1023  For home use only DME oxygen  Once       Question Answer Comment  Length of Need 6 Months   Mode or (Route) Nasal cannula   Liters per Minute 2   Frequency Continuous (stationary and portable oxygen unit needed)   Oxygen conserving device Yes   Oxygen delivery system Gas      07/14/22 1023           Allergies  Allergen Reactions   Crestor [Rosuvastatin] Other (See Comments)    Nightmares    Zocor [Simvastatin] Other (See Comments)    Nightmares    Zoloft [Sertraline] Diarrhea    Follow-up Information     [facility]. Go in 16 day(s).   Specialty: Cardiology Why: Hosptal follow up 07/30/2022 @ 3 pm PLEASE bring a current medication list  to appointment FREE valet parking, Entrance C, off [employer] information: [address] [id number] mc [place] [address] [phone]        Gweneth Dimitri, MD. Go on 07/23/2022.   Specialty: Family Medicine Why: @11 :15am Contact information: [address] [place] [address] [phone]                  The results of significant diagnostics from this hospitalization (including imaging, microbiology, ancillary and laboratory) are listed below for reference.    Significant Diagnostic Studies: ECHOCARDIOGRAM COMPLETE  Result Date: 07/10/2022    ECHOCARDIOGRAM REPORT   Patient Name:   Cody Thomas Date of Exam: 07/10/2022 Medical Rec #:  [id number]          Height:       64.0 in Accession #:    [id number]  Weight:       164.2 lb Date of Birth:  04/16/1930           BSA:          1.799 m Patient Age:    89 years           BP:           168/108 mmHg Patient Gender: M                  HR:           80 bpm. Exam Location:  Inpatient Procedure: 2D Echo, Color Doppler and Cardiac Doppler Indications:    acute diastolic chf  History:        Patient has prior history of Echocardiogram examinations, most                 recent 03/19/2019. CHF, chronic kidney disease, Arrythmias:Atrial                 Fibrillation; Risk Factors:Hypertension, Sleep Apnea, Diabetes                 and Dyslipidemia.  Sonographer:    Delcie Roch RDCS Referring Phys: [id number] Glade Lloyd IMPRESSIONS  1. Left ventricular ejection fraction, by estimation, is 40 to 45%. The left ventricle has mildly decreased function. The left ventricle demonstrates global hypokinesis. There is moderate asymmetric left ventricular hypertrophy of the basal-septal segment. Left ventricular diastolic parameters are indeterminate.  2. Right ventricular systolic function is moderately reduced. The right ventricular size is moderately enlarged. There is severely elevated pulmonary  artery systolic pressure. The estimated right ventricular systolic pressure is 63.4 mmHg.  3. Left atrial size was severely dilated.  4. Right atrial size was severely dilated.  5. The mitral valve is normal in structure. Moderate mitral valve regurgitation.  6. Tricuspid valve regurgitation is moderate.  7. The aortic valve is tricuspid. Aortic valve regurgitation is trivial. Aortic valve sclerosis/calcification is present, without any evidence of aortic stenosis.  8. The inferior vena cava is dilated in size with <50% respiratory variability, suggesting right atrial pressure of 15 mmHg. FINDINGS  Left Ventricle: Left ventricular ejection fraction, by estimation, is 40 to 45%. The left ventricle has mildly decreased function. The left ventricle demonstrates global hypokinesis. The left ventricular internal cavity size was small. There is moderate  asymmetric left ventricular hypertrophy of the basal-septal segment. Left ventricular diastolic parameters are indeterminate. Right Ventricle: The right ventricular size is moderately enlarged. Right vetricular wall thickness was not well visualized. Right ventricular systolic function is moderately reduced. There is severely elevated pulmonary artery systolic pressure. The tricuspid regurgitant velocity is 3.48 m/s, and with an assumed right atrial pressure of 15 mmHg, the estimated right ventricular systolic pressure is 63.4 mmHg. Left Atrium: Left atrial size was severely dilated. Right Atrium: Right atrial size was severely dilated. Pericardium: There is no evidence of pericardial effusion. Mitral Valve: The mitral valve is normal in structure. Moderate mitral valve regurgitation. Tricuspid Valve: The tricuspid valve is normal in structure. Tricuspid valve regurgitation is moderate. Aortic Valve: The aortic valve is tricuspid. Aortic valve regurgitation is trivial. Aortic valve sclerosis/calcification is present, without any evidence of aortic stenosis. Pulmonic  Valve: The pulmonic valve was not well visualized. Pulmonic valve regurgitation is mild to moderate. Aorta: The aortic root is normal in size and structure. Venous: The inferior vena cava is dilated in size with less than 50% respiratory variability, suggesting right atrial pressure of  15 mmHg. IAS/Shunts: The interatrial septum was not well visualized.  LEFT VENTRICLE PLAX 2D LVIDd:         3.70 cm LVIDs:         2.90 cm LV PW:         1.30 cm LV IVS:        1.20 cm LVOT diam:     2.40 cm LVOT Area:     4.52 cm  RIGHT VENTRICLE          IVC RV Basal diam:  3.80 cm  IVC diam: 2.60 cm LEFT ATRIUM              Index        RIGHT ATRIUM           Index LA diam:        4.80 cm  2.67 cm/m   RA Area:     27.60 cm LA Vol (A2C):   103.0 ml 57.25 ml/m  RA Volume:   92.50 ml  51.42 ml/m LA Vol (A4C):   98.1 ml  54.53 ml/m LA Biplane Vol: 106.0 ml 58.92 ml/m   AORTA Ao Root diam: 3.60 cm Ao Asc diam:  3.30 cm TRICUSPID VALVE TR Peak grad:   48.4 mmHg TR Vmax:        348.00 cm/s  SHUNTS Systemic Diam: 2.40 cm Epifanio Lesches MD Electronically signed by Epifanio Lesches MD Signature Date/Time: 07/10/2022/6:39:54 PM    Final    DG Chest Port 1 View  Result Date: 07/10/2022 CLINICAL DATA:  Shortness of breath. EXAM: PORTABLE CHEST 1 VIEW COMPARISON:  07/08/2022 FINDINGS: The cardio pericardial silhouette is enlarged. Vascular congestion noted with diffuse interstitial opacity suggesting edema. No substantial pleural effusion. Bones are diffusely demineralized. Telemetry leads overlie the chest. IMPRESSION: Enlargement of the cardiopericardial silhouette with vascular congestion and interstitial edema. Electronically Signed   By: Kennith Center M.D.   On: 07/10/2022 12:26    Microbiology: Recent Results (from the past 240 hour(s))  Culture, blood (routine x 2)     Status: None (Preliminary result)   Collection Time: 07/10/22 11:52 AM   Specimen: BLOOD  Result Value Ref Range Status   Specimen Description  BLOOD BLOOD RIGHT FOREARM  Final   Special Requests   Final    BOTTLES DRAWN AEROBIC AND ANAEROBIC Blood Culture results may not be optimal due to an excessive volume of blood received in culture bottles   Culture   Final    NO GROWTH 4 DAYS Performed at [facility], [address], [place], [place] [address]    Report Status PENDING  Incomplete     Labs: Basic Metabolic Panel: Recent Labs  Lab 07/10/22 1129 07/11/22 0947 07/12/22 0103 07/14/22 0042  NA 136 138 136 133*  K 3.5 2.9* 4.1 4.2  CL 101 98 98 97*  CO2 23 29 30 29   GLUCOSE 142* 159* 109* 100*  BUN 28* 26* 29* 30*  CREATININE 2.02* 2.06* 1.98* 2.05*  CALCIUM 9.1 8.9 9.1 9.1  MG  --  1.7 2.1  --    Liver Function Tests: Recent Labs  Lab 07/11/22 0947  AST 23  ALT 15  ALKPHOS 55  BILITOT 1.4*  PROT 6.1*  ALBUMIN 3.1*   No results for input(s): "LIPASE", "AMYLASE" in the last 168 hours. No results for input(s): "AMMONIA" in the last 168 hours. CBC: Recent Labs  Lab 07/10/22 1129 07/11/22 0947 07/12/22 0103  WBC 7.1 6.4 6.0  HGB 16.3 16.0 15.8  HCT 47.8 46.9 45.9  MCV 100.0 98.9 99.4  PLT 108* 103* 98*   Cardiac Enzymes: No results for input(s): "CKTOTAL", "CKMB", "CKMBINDEX", "TROPONINI" in the last 168 hours. BNP: BNP (last 3 results) Recent Labs    07/10/22 1129  BNP 3,339.2*    ProBNP (last 3 results) No results for input(s): "PROBNP" in the last 8760 hours.  CBG: No results for input(s): "GLUCAP" in the last 168 hours.     Signed:  Zannie Cove MD.  [facility] 07/14/2022, 11:59 AM

## 2022-07-14 NOTE — Care Management Important Message (Signed)
Important Message  Patient Details  Name: Cody Thomas MRN: [id number] Date of Birth: 21-Jan-1931   Medicare Important Message Given:  Yes     Renie Ora 07/14/2022, 8:28 AM

## 2022-07-14 NOTE — Progress Notes (Signed)
Nurse requested Mobility Specialist to perform oxygen saturation test with pt which includes removing pt from oxygen both at rest and while ambulating.  Below are the results from that testing.     Patient Saturations on Room Air at Rest = spO2 87%  Patient Saturations on Room Air while Ambulating = sp02 N/A% .   Patient Saturations on 2 Liters of oxygen while Ambulating = sp02 92%  At end of testing pt left in room on 2  Liters of oxygen.  Reported results to nurse.  Addison Lank Mobility Specialist Please contact via SecureChat or  Rehab office at [phone]

## 2022-07-14 NOTE — Progress Notes (Signed)
Mobility Specialist Progress Note:   07/14/22 1000  Mobility  Activity Ambulated with assistance in hallway  Level of Assistance Contact guard assist, steadying assist  Assistive Device Front wheel walker  Distance Ambulated (ft) 300 ft  Activity Response Tolerated well  Mobility Referral Yes  $Mobility charge 1 Mobility  Mobility Specialist Start Time (ACUTE ONLY) 0955  Mobility Specialist Stop Time (ACUTE ONLY) 1008  Mobility Specialist Time Calculation (min) (ACUTE ONLY) 13 min   Pt received on RA, SpO2 87%. Placed on 2LO2, SpO2 WFL throughout ambulation. Back in chair with all needs met, eager for d/c.  Addison Lank Mobility Specialist Please contact via SecureChat or  Rehab office at [phone]

## 2022-07-14 NOTE — Progress Notes (Signed)
Patient is requiring oxygen and is not discharge lounge appropriate.   Orvan Seen SWOT RN

## 2022-07-14 NOTE — Progress Notes (Signed)
Successful contact with TOC pharmacy. Was informed medications will be ready for pickup in 25 minutes.

## 2022-07-14 NOTE — Progress Notes (Addendum)
   07/14/22 1039  AVS Discharge Documentation  AVS Discharge Instructions Including Medications Provided to patient/caregiver  Name of Person Receiving AVS Discharge Instructions Including Medications Cody Thomas  Name of Clinician That Reviewed AVS Discharge Instructions Including Medications Anne Shutter, RN   TOC delivered medications to bedside. AVS instructions also given verbally to patient's daughter over the phone. She stated she will be here in about an hour.

## 2022-07-14 NOTE — Progress Notes (Signed)
MD at bedside. Requesting for oxygen walk test to be done again.

## 2022-07-15 LAB — CULTURE, BLOOD (ROUTINE X 2): Culture: NO GROWTH

## 2022-07-24 NOTE — Progress Notes (Signed)
HEART & VASCULAR TRANSITION OF CARE CONSULT NOTE     Referring Physician: Dr. Jomarie Longs Primary Care: Gweneth Dimitri, MD Primary Cardiologist: Dr. Anne Fu  HPI: Referred to clinic by Dr. Jomarie Longs for heart failure consultation.   Cody Thomas is a 89 y.o. with past medication history of CAD (s/p CABG), atrial fibrillation, DM 2, CKD III, OSA and new diagnosis of HFmrEF.  Admitted 5/24 with new HFmrEF. Diuresed with IV lasix. Echo showed EF 40-45%, RV moderately reduced, moderate MR. GDMT titrated, but beta blockade limited by hx of bradycardia/pauses. He was discharged home with cardiology follow up, weight 152 lbs.  Today he presents to [facility] for post hospital HF follow up. Overall feeling fine. Denies increasing SOB, CP, dizziness, edema, or PND/Orthopnea. Appetite ok. No fever or chills. Weight at home 170 pounds. Taking all medications.      Cardiac Testing  - Echo (5/24): EF 40-45%, moderate asymmetric LVH, RV moderately reduced, moderate MR and TR  - Event monitor (3/21): showed multiple episodes of AFL with variable conduction and multiple episodes of NSVT longest was 9 seconds. He also had a pause of greater than 3 seconds. This pause was during conversion of atrial flutter to normal sinus rhythm   - Echo (1/21): EF 60-65%, RV mildly reduced Review of Systems: [y] = yes, [ ]  = no   General: Weight gain [ ] ; Weight loss [ ] ; Anorexia [ ] ; Fatigue [ ] ; Fever [ ] ; Chills [ ] ; Weakness [ ]   Cardiac: Chest pain/pressure [ ] ; Resting SOB [ ] ; Exertional SOB [ ] ; Orthopnea [ ] ; Pedal Edema [ ] ; Palpitations [ ] ; Syncope [ ] ; Presyncope [ ] ; Paroxysmal nocturnal dyspnea[ ]   Pulmonary: Cough [ ] ; Wheezing[ ] ; Hemoptysis[ ] ; Sputum [ ] ; Snoring [ ]   GI: Vomiting[ ] ; Dysphagia[ ] ; Melena[ ] ; Hematochezia [ ] ; Heartburn[ ] ; Abdominal pain [ ] ; Constipation [ ] ; Diarrhea [ ] ; BRBPR [ ]   GU: Hematuria[ ] ; Dysuria [ ] ; Nocturia[ ]   Vascular: Pain in legs with walking [ ] ; Pain in feet  with lying flat [ ] ; Non-healing sores [ ] ; Stroke [ ] ; TIA [ ] ; Slurred speech [ ] ;  Neuro: Headaches[ ] ; Vertigo[ ] ; Seizures[ ] ; Paresthesias[ ] ;Blurred vision [ ] ; Diplopia [ ] ; Vision changes [ ]   Ortho/Skin: Arthritis [ ] ; Joint pain [ ] ; Muscle pain [ ] ; Joint swelling [ ] ; Back Pain [ ] ; Rash [ ]   Psych: Depression[ ] ; Anxiety[ ]   Heme: Bleeding problems [ ] ; Clotting disorders [ ] ; Anemia [ ]   Endocrine: Diabetes [ ] ; Thyroid dysfunction[ ]    Past Medical History:  Diagnosis Date   Anxiety    Bunion 09/29/2011   Cancer (HCC)    nose/Dr. Albertini;skin   Chronic constipation    Chronic kidney disease    stage 111   Coronary atherosclerosis of native coronary artery    2009 LAD CIRC DES   Depression    doesn't take any meds for this   Diabetes mellitus    takes Januvia daily   Dizziness    Enlarged prostate    but doesn't require meds at present   GERD (gastroesophageal reflux disease)    TUMS prn   H/O hiatal hernia    Headache(784.0)    History of gout    doesn't require meds   Hx of cardiac cath    Hyperlipidemia    Crestor 3 x wk and Zetia daily   Hypertension    takes Hyzaar daily  Insomnia    doesn't require meds at present time   Joint pain    Onychomycosis 10/05/2012   x 10   PONV (postoperative nausea and vomiting)    pt states extremely sick   Shortness of breath    with exertion   Current Outpatient Medications  Medication Sig Dispense Refill   apixaban (ELIQUIS) 2.5 MG TABS tablet Take 1 tablet (2.5 mg total) by mouth 2 (two) times daily. 180 tablet 0   Coenzyme Q10 (COQ10 PO) Take 100 mg by mouth every evening. Co Q 10 100mg /28mL     escitalopram (LEXAPRO) 5 MG tablet Take 2.5 mg by mouth every evening.     furosemide (LASIX) 40 MG tablet Take 1 tablet (40 mg total) by mouth daily. 30 tablet 0   hydrALAZINE (APRESOLINE) 25 MG tablet Take 1 tablet (25 mg total) by mouth 3 (three) times daily. 90 tablet 0   isosorbide mononitrate (IMDUR) 30 MG  24 hr tablet Take 1 tablet (30 mg total) by mouth daily. 30 tablet 0   metoprolol tartrate (LOPRESSOR) 25 MG tablet TAKE 1 TABLET(25 MG) BY MOUTH TWICE DAILY (Patient taking differently: Take 25 mg by mouth 2 (two) times daily.) 180 tablet 3   Omega-3 Fatty Acids (FISH OIL PO) Take 2 capsules by mouth every evening.     traZODone (DESYREL) 50 MG tablet Take 50 mg by mouth at bedtime.     No current facility-administered medications for this visit.   Allergies  Allergen Reactions   Crestor [Rosuvastatin] Other (See Comments)    Nightmares    Zocor [Simvastatin] Other (See Comments)    Nightmares    Zoloft [Sertraline] Diarrhea   Social History   Socioeconomic History   Marital status: Widowed    Spouse name: Not on file   Number of children: 2   Years of education: HS   Highest education level: Not on file  Occupational History   Occupation: Retired  Tobacco Use   Smoking status: Former    Packs/day: 1.50    Years: 30.00    Additional pack years: 0.00    Total pack years: 45.00    Types: Cigarettes    Quit date: 03/02/1984    Years since quitting: 38.4   Smokeless tobacco: Never  Vaping Use   Vaping Use: Never used  Substance and Sexual Activity   Alcohol use: No    Alcohol/week: 0.0 standard drinks of alcohol   Drug use: No   Sexual activity: Not Currently  Other Topics Concern   Not on file  Social History Narrative   Lives at home with his wife and great-granddaughter.   Right-handed.   Occasional caffeine use.   Social Determinants of Health   Financial Resource Strain: Low Risk  (07/13/2022)   Overall Financial Resource Strain (CARDIA)    Difficulty of Paying Living Expenses: Not hard at all  Food Insecurity: No Food Insecurity (07/10/2022)   Hunger Vital Sign    Worried About Running Out of Food in the Last Year: Never true    Ran Out of Food in the Last Year: Never true  Transportation Needs: No Transportation Needs (07/13/2022)   PRAPARE - Therapist, art (Medical): No    Lack of Transportation (Non-Medical): No  Physical Activity: Not on file  Stress: Not on file  Social Connections: Not on file  Intimate Partner Violence: Not At Risk (07/10/2022)   Humiliation, Afraid, Rape, and Kick questionnaire    Fear  of Current or Ex-Partner: No    Emotionally Abused: No    Physically Abused: No    Sexually Abused: No   Family History  Problem Relation Age of Onset   Heart disease Mother    Stroke Father    Cancer Brother     There were no vitals filed for this visit.  PHYSICAL EXAM: General:  Well appearing. No respiratory difficulty HEENT: normal Neck: supple. no JVD. Carotids 2+ bilat; no bruits. No lymphadenopathy or thryomegaly appreciated. Cor: PMI nondisplaced. Regular rate & rhythm. No rubs, gallops or murmurs. Lungs: clear Abdomen: soft, nontender, nondistended. No hepatosplenomegaly. No bruits or masses. Good bowel sounds. Extremities: no cyanosis, clubbing, rash, edema Neuro: alert & oriented x 3, cranial nerves grossly intact. moves all 4 extremities w/o difficulty. Affect pleasant.  ECG (personally reviewed)   ASSESSMENT & PLAN: HFmrEF - Echo (1/21): EF 60-65%, RV mildly reduced. - Echo (5/24): EF 40-45%, RV moderately reduced. - NYHA ***, volume ok - Start losartan 12.5 mg daily - Consider adding Jardiance next - Continue Lasix 40 mg daily - Continue hydralazine 25 mg tid + Imdur 30 mg daily - Continue Toprol XL 25 mg daily. - Labs today, repeat BMET in 10-14 days.  Chronic AF - Rate controlled - Continue Eliquis - Continue Toprol  3. CAD - s/p CABG (2013) - No chest pain - Continue beta blocker - Continue statin - No ASA with need for warfarin  4. HTN - BP controlled - GDMT as above  5. DM2 - A1c 6.2 - On insulin  6. CKD3 - Baseline SCr 1.4-1.6 - Plan to add Jardiance soon  NYHA *** GDMT  Diuretic: BB: Ace/ARB/ARNI: MRA: SGLT2i:  Referred to HFSW (PCP,  Medications, Transportation, ETOH Abuse, Drug Abuse, Insurance, Financial ): Yes or No Refer to Pharmacy: Yes or No Refer to Home Health: Yes on No Refer to Advanced Heart Failure Clinic: Yes or no  Refer to General Cardiology: Yes or No  Follow up in TOC in 3-4 weeks for further med titration, then back with general cardiology, as scheduled.  Prince Rome, FNP-BC 07/24/22

## 2022-07-30 ENCOUNTER — Encounter (HOSPITAL_COMMUNITY): Payer: Self-pay

## 2022-07-30 ENCOUNTER — Ambulatory Visit (HOSPITAL_COMMUNITY)
Admit: 2022-07-30 | Discharge: 2022-07-30 | Disposition: A | Discharge status: Home / Self Care | Payer: Medicare Other | Attending: Cardiology | Admitting: Cardiology

## 2022-07-30 VITALS — BP 120/60 | HR 74 | Wt 157.0 lb

## 2022-07-30 DIAGNOSIS — Z7901 Long term (current) use of anticoagulants: Secondary | ICD-10-CM | POA: Insufficient documentation

## 2022-07-30 DIAGNOSIS — I482 Chronic atrial fibrillation, unspecified: Secondary | ICD-10-CM | POA: Insufficient documentation

## 2022-07-30 DIAGNOSIS — Z79899 Other long term (current) drug therapy: Secondary | ICD-10-CM | POA: Diagnosis not present

## 2022-07-30 DIAGNOSIS — I4892 Unspecified atrial flutter: Secondary | ICD-10-CM | POA: Diagnosis not present

## 2022-07-30 DIAGNOSIS — Z87891 Personal history of nicotine dependence: Secondary | ICD-10-CM | POA: Insufficient documentation

## 2022-07-30 DIAGNOSIS — N183 Chronic kidney disease, stage 3 unspecified: Secondary | ICD-10-CM | POA: Insufficient documentation

## 2022-07-30 DIAGNOSIS — F32A Depression, unspecified: Secondary | ICD-10-CM | POA: Diagnosis not present

## 2022-07-30 DIAGNOSIS — F419 Anxiety disorder, unspecified: Secondary | ICD-10-CM | POA: Insufficient documentation

## 2022-07-30 DIAGNOSIS — E785 Hyperlipidemia, unspecified: Secondary | ICD-10-CM | POA: Diagnosis not present

## 2022-07-30 DIAGNOSIS — E1122 Type 2 diabetes mellitus with diabetic chronic kidney disease: Secondary | ICD-10-CM | POA: Insufficient documentation

## 2022-07-30 DIAGNOSIS — R42 Dizziness and giddiness: Secondary | ICD-10-CM | POA: Diagnosis not present

## 2022-07-30 DIAGNOSIS — G4700 Insomnia, unspecified: Secondary | ICD-10-CM | POA: Insufficient documentation

## 2022-07-30 DIAGNOSIS — I4821 Permanent atrial fibrillation: Secondary | ICD-10-CM

## 2022-07-30 DIAGNOSIS — I2510 Atherosclerotic heart disease of native coronary artery without angina pectoris: Secondary | ICD-10-CM | POA: Insufficient documentation

## 2022-07-30 DIAGNOSIS — Z794 Long term (current) use of insulin: Secondary | ICD-10-CM | POA: Insufficient documentation

## 2022-07-30 DIAGNOSIS — I130 Hypertensive heart and chronic kidney disease with heart failure and stage 1 through stage 4 chronic kidney disease, or unspecified chronic kidney disease: Secondary | ICD-10-CM | POA: Insufficient documentation

## 2022-07-30 DIAGNOSIS — I5022 Chronic systolic (congestive) heart failure: Secondary | ICD-10-CM

## 2022-07-30 DIAGNOSIS — I340 Nonrheumatic mitral (valve) insufficiency: Secondary | ICD-10-CM

## 2022-07-30 DIAGNOSIS — I509 Heart failure, unspecified: Secondary | ICD-10-CM | POA: Insufficient documentation

## 2022-07-30 DIAGNOSIS — I472 Ventricular tachycardia, unspecified: Secondary | ICD-10-CM | POA: Insufficient documentation

## 2022-07-30 DIAGNOSIS — Z951 Presence of aortocoronary bypass graft: Secondary | ICD-10-CM | POA: Diagnosis not present

## 2022-07-30 DIAGNOSIS — G4733 Obstructive sleep apnea (adult) (pediatric): Secondary | ICD-10-CM | POA: Diagnosis not present

## 2022-07-30 LAB — BASIC METABOLIC PANEL
Anion gap: 12 (ref 5–15)
BUN: 21 mg/dL (ref 8–23)
CO2: 27 mmol/L (ref 22–32)
Calcium: 9 mg/dL (ref 8.9–10.3)
Chloride: 96 mmol/L — ABNORMAL LOW (ref 98–111)
Creatinine, Ser: 1.69 mg/dL — ABNORMAL HIGH (ref 0.61–1.24)
GFR, Estimated: 38 mL/min — ABNORMAL LOW (ref >60)
Glucose, Bld: 204 mg/dL — ABNORMAL HIGH (ref 70–99)
Potassium: 3.1 mmol/L — ABNORMAL LOW (ref 3.5–5.1)
Sodium: 135 mmol/L (ref 135–145)

## 2022-07-30 LAB — BRAIN NATRIURETIC PEPTIDE: B Natriuretic Peptide: 1747.2 pg/mL — ABNORMAL HIGH (ref 0.0–100.0)

## 2022-07-30 NOTE — Patient Instructions (Signed)
PLEASE take a dose of Lasix 20 mg today.  Labs done today, your results will be available in MyChart, we will contact you for abnormal readings.  Thank you for allowing [place] to provider your heart failure care after your recent hospitalization. Please follow-up with Dr. Anne Fu in 3 weeks. His office should be calling you to arrange your appointment.  PLEASE WEAR YOUR COMPRESSION SOCKS.  If you have any questions, issues, or concerns before your next appointment please call our office at [phone], opt. [phone] and leave a message for the triage nurse.

## 2022-07-31 ENCOUNTER — Telehealth (HOSPITAL_COMMUNITY): Payer: Self-pay | Admitting: *Deleted

## 2022-07-31 DIAGNOSIS — I5022 Chronic systolic (congestive) heart failure: Secondary | ICD-10-CM

## 2022-07-31 MED ORDER — POTASSIUM CHLORIDE CRYS ER 20 MEQ PO TBCR: 40.0000 meq | EXTENDED_RELEASE_TABLET | Freq: Every day | ORAL | 3 refills | Status: DC

## 2022-07-31 MED ORDER — FUROSEMIDE 40 MG PO TABS: ORAL_TABLET | ORAL | 1 refills | Status: DC

## 2022-07-31 NOTE — Telephone Encounter (Signed)
  Geroge Baseman [place], RN 07/31/2022  5:01 PM EDT Back to Top    Spoke w/pt's son, Aurther Loft, he is aware, agreeable, and verbalized understanding, rx for kcl sent in, repeat labs sch 6/6   Faythe Casa, [facility] 07/31/2022  2:41 PM EDT     Called both numbers in patient's chart and was unable to leave a voice message.

## 2022-07-31 NOTE — Telephone Encounter (Signed)
-----   Message from Allayne Butcher, [place] sent at 07/31/2022  8:52 AM EDT ----- Fluid marker still elevated. Increase lasix to 40 mg qam and 20 mg qpm every day. Also add KCl 40 meQ daily. Needs f/u BMP and BNP in 1 wk

## 2022-08-04 ENCOUNTER — Other Ambulatory Visit: Payer: Self-pay | Admitting: Cardiology

## 2022-08-04 DIAGNOSIS — I480 Paroxysmal atrial fibrillation: Secondary | ICD-10-CM

## 2022-08-05 NOTE — Telephone Encounter (Signed)
Prescription refill request for Eliquis received. Indication: AF Last office visit: 07/30/22  B Simmons PA-C Scr: 1.69 on 07/30/22  Epic Age: 89 Weight: 71.2kg  Based on above findings Eliquis 2.5mg  twice daily is the appropriate dose.  Refill approved.

## 2022-08-06 ENCOUNTER — Ambulatory Visit (HOSPITAL_COMMUNITY)
Admission: RE | Admit: 2022-08-06 | Discharge: 2022-08-06 | Disposition: A | Discharge status: Home / Self Care | Payer: Medicare Other | Source: Ambulatory Visit | Attending: Internal Medicine | Admitting: Internal Medicine

## 2022-08-06 DIAGNOSIS — I5022 Chronic systolic (congestive) heart failure: Secondary | ICD-10-CM | POA: Diagnosis present

## 2022-08-06 LAB — BRAIN NATRIURETIC PEPTIDE: B Natriuretic Peptide: 1577.8 pg/mL — ABNORMAL HIGH (ref 0.0–100.0)

## 2022-08-06 LAB — BASIC METABOLIC PANEL
Anion gap: 9 (ref 5–15)
BUN: 26 mg/dL — ABNORMAL HIGH (ref 8–23)
CO2: 30 mmol/L (ref 22–32)
Calcium: 9.5 mg/dL (ref 8.9–10.3)
Chloride: 98 mmol/L (ref 98–111)
Creatinine, Ser: 1.69 mg/dL — ABNORMAL HIGH (ref 0.61–1.24)
GFR, Estimated: 38 mL/min — ABNORMAL LOW (ref >60)
Glucose, Bld: 101 mg/dL — ABNORMAL HIGH (ref 70–99)
Potassium: 4.4 mmol/L (ref 3.5–5.1)
Sodium: 137 mmol/L (ref 135–145)

## 2022-08-10 ENCOUNTER — Other Ambulatory Visit (INDEPENDENT_AMBULATORY_CARE_PROVIDER_SITE_OTHER): Payer: Medicare Other

## 2022-08-10 ENCOUNTER — Other Ambulatory Visit (HOSPITAL_COMMUNITY): Payer: Self-pay

## 2022-08-10 DIAGNOSIS — E669 Obesity, unspecified: Secondary | ICD-10-CM | POA: Diagnosis not present

## 2022-08-10 DIAGNOSIS — E1169 Type 2 diabetes mellitus with other specified complication: Secondary | ICD-10-CM | POA: Diagnosis not present

## 2022-08-10 LAB — HEMOGLOBIN A1C: Hgb A1c MFr Bld: 6.2 % (ref 4.6–6.5)

## 2022-08-10 LAB — BASIC METABOLIC PANEL
BUN: 34 mg/dL — ABNORMAL HIGH (ref 6–23)
CO2: 30 mEq/L (ref 19–32)
Calcium: 9.7 mg/dL (ref 8.4–10.5)
Chloride: 94 mEq/L — ABNORMAL LOW (ref 96–112)
Creatinine, Ser: 1.79 mg/dL — ABNORMAL HIGH (ref 0.40–1.50)
GFR: 33.1 mL/min — ABNORMAL LOW (ref >60.00)
Glucose, Bld: 104 mg/dL — ABNORMAL HIGH (ref 70–99)
Potassium: 4.3 mEq/L (ref 3.5–5.1)
Sodium: 135 mEq/L (ref 135–145)

## 2022-08-10 LAB — MICROALBUMIN / CREATININE URINE RATIO
Creatinine,U: 33.4 mg/dL
Microalb Creat Ratio: 2.1 mg/g (ref 0.0–30.0)
Microalb, Ur: <0.7 mg/dL (ref 0.0–1.9)

## 2022-08-12 ENCOUNTER — Encounter: Payer: Self-pay | Admitting: Cardiology

## 2022-08-12 ENCOUNTER — Telehealth: Payer: Self-pay | Admitting: Cardiology

## 2022-08-12 ENCOUNTER — Ambulatory Visit (INDEPENDENT_AMBULATORY_CARE_PROVIDER_SITE_OTHER): Payer: Medicare Other | Admitting: Endocrinology

## 2022-08-12 VITALS — BP 110/80 | HR 48 | Ht 63.75 in | Wt 149.2 lb

## 2022-08-12 DIAGNOSIS — E041 Nontoxic single thyroid nodule: Secondary | ICD-10-CM

## 2022-08-12 DIAGNOSIS — E119 Type 2 diabetes mellitus without complications: Secondary | ICD-10-CM

## 2022-08-12 NOTE — Patient Instructions (Signed)
Check blood sugars on waking up  weekly  Also check blood sugars about 2 hours after meals and do this after different meals by rotation  Recommended blood sugar levels on waking up are 90-130 and about 2 hours after meal is 130-180

## 2022-08-12 NOTE — Telephone Encounter (Signed)
-----   Message from Linda Hedges, RN sent at 07/30/2022  3:39 PM EDT ----- Will need a post hospital follow up with a provider in about 3 weeks.  Was seen at the today at the heart failure [facility] clinic today, we are not keeping him. Dr. Anne Fu is his cardiologist.

## 2022-08-12 NOTE — Telephone Encounter (Signed)
Called and LVM with patient 3x with no success in scheduling. Will send reminder letter to patient.

## 2022-08-12 NOTE — Progress Notes (Signed)
Patient ID: Cody Thomas, male   DOB: 23-Feb-1931, 89 y.o.   MRN: [id number]   Reason for Appointment: Diabetes follow-up   History of Present Illness   Diagnosis: Type 2 DIABETES MELITUS  He has had mild diabetes which has been previously erratic partly because of dietary inconsistency and difficulty with weight loss He has not been continued on metformin because of renal dysfunction Was given low-dose Januvia instead but this was too expensive for him even though it was helping his control In 2014 he was given low dose Amaryl instead which appears to be keeping his blood sugars controlled  His blood sugars were not well controlled when he was seen in 1/16 with A1c 7.8  Subsequently Amaryl was stopped partly because of mild hypoglycemia  RECENT history:  A1c is 6.2, not changed  Previous range 6.2-8.0  Current diabetes treatment: NONE  Current blood sugar patterns, problems and management: Again not able to communicate with him easily because of significant hearing loss and interpretation was done through family member He was supposed to be taking Prandin before meals as of the last visit but this was stopped during his admission about a month ago Right after discharge his blood sugars were over 200 even with not being on prednisone as seen on his discharge summary He is also checking blood sugars very sporadically and has done only 3 readings in the last 10 days Lab glucose was 104 and a few days ago 101 Home readings recently range from 110, 125 with high reading after lunch Mostly eating 2 meals a day and he thinks his appetite is fairly good However has overall lost weight with his intercurrent illness  Monitors blood glucose: Once a day or less .    Glucometer:   CONTOUR          Blood Glucose readings as above  Previously:   PRE-MEAL Fasting Lunch Dinner Bedtime Overall  Glucose range: 92-121  147    Mean/median:        POST-MEAL PC Breakfast PC  Lunch PC Dinner  Glucose range:  175, 160   Mean/median:       Dietitian consultation last done several years ago  Breakfast meals: Sometimes Cheerios cereal, oatmeal, sometimes an egg, toast and apple butter May have cookies for snacks   Wt Readings from Last 3 Encounters:  08/12/22 149 lb 3.2 oz (67.7 kg)  07/30/22 157 lb (71.2 kg)  07/14/22 152 lb 6.4 oz (69.1 kg)   DM LABS:  Lab Results  Component Value Date   HGBA1C 6.2 08/10/2022   HGBA1C 6.2 04/09/2022   HGBA1C 7.3 (H) 09/24/2020   Lab Results  Component Value Date   MICROALBUR <0.7 08/10/2022   LDLCALC 50 08/21/2019   CREATININE 1.79 (H) 08/10/2022   Lab on 08/10/2022  Component Date Value Ref Range Status   Microalb, Ur 08/10/2022 <0.7  0.0 - 1.9 mg/dL Final   Creatinine,U 65/78/4696 33.4  mg/dL Final   Microalb Creat Ratio 08/10/2022 2.1  0.0 - 30.0 mg/g Final   Sodium 08/10/2022 135  135 - 145 mEq/L Final   Potassium 08/10/2022 4.3  3.5 - 5.1 mEq/L Final   Chloride 08/10/2022 94 (L)  96 - 112 mEq/L Final   CO2 08/10/2022 30  19 - 32 mEq/L Final   Glucose, Bld 08/10/2022 104 (H)  70 - 99 mg/dL Final   BUN 29/52/8413 34 (H)  6 - 23 mg/dL Final   Creatinine, Ser 08/10/2022 1.79 (H)  0.40 - 1.50 mg/dL Final   GFR 16/12/9602 33.10 (L)  >60.00 mL/min Final   Calculated using the CKD-EPI Creatinine Equation (2021)   Calcium 08/10/2022 9.7  8.4 - 10.5 mg/dL Final   Hgb V4U MFr Bld 08/10/2022 6.2  4.6 - 6.5 % Final   Glycemic Control Guidelines for People with Diabetes:Non Diabetic:  <6%Goal of Therapy: <7%Additional Action Suggested:  >8%   Hospital Outpatient Visit on 08/06/2022  Component Date Value Ref Range Status   B Natriuretic Peptide 08/06/2022 1,577.8 (H)  0.0 - 100.0 pg/mL Final   Performed at [facility], [address], [place], [place] [address]   Sodium 08/06/2022 137  135 - 145 mmol/L Final   Potassium 08/06/2022 4.4  3.5 - 5.1 mmol/L Final   Chloride 08/06/2022 98  98 - 111 mmol/L Final    CO2 08/06/2022 30  22 - 32 mmol/L Final   Glucose, Bld 08/06/2022 101 (H)  70 - 99 mg/dL Final   Glucose reference range applies only to samples taken after fasting for at least 8 hours.   BUN 08/06/2022 26 (H)  8 - 23 mg/dL Final   Creatinine, Ser 08/06/2022 1.69 (H)  0.61 - 1.24 mg/dL Final   Calcium 14/78/2956 9.5  8.9 - 10.3 mg/dL Final   GFR, Estimated 08/06/2022 38 (L)  >60 mL/min Final   Comment: (NOTE) Calculated using the CKD-EPI Creatinine Equation (2021)    Anion gap 08/06/2022 9  5 - 15 Final   Performed at [facility], [address], [place], [place] [address]     Allergies as of 08/12/2022       Reactions   Crestor [rosuvastatin] Other (See Comments)   Nightmares    Sertraline Diarrhea   Zocor [simvastatin] Other (See Comments)   Nightmares         Medication List        Accurate as of August 12, 2022 10:34 AM. If you have any questions, ask your nurse or doctor.          COQ10 PO Take 100 mg by mouth every evening. Co Q 10 100mg /68mL   Eliquis 2.5 MG Tabs tablet Generic drug: apixaban TAKE 1 TABLET BY MOUTH TWICE A DAY   escitalopram 5 MG tablet Commonly known as: LEXAPRO Take 2.5 mg by mouth every evening.   FISH OIL PO Take 2 capsules by mouth every evening.   furosemide 40 MG tablet Commonly known as: LASIX Take 1 tablet (40 mg total) by mouth every morning AND 0.5 tablets (20 mg total) every evening.   hydrALAZINE 25 MG tablet Commonly known as: APRESOLINE Take 1 tablet (25 mg total) by mouth 3 (three) times daily. What changed: when to take this   isosorbide mononitrate 30 MG 24 hr tablet Commonly known as: IMDUR Take 1 tablet (30 mg total) by mouth daily.   metoprolol tartrate 25 MG tablet Commonly known as: LOPRESSOR TAKE 1 TABLET(25 MG) BY MOUTH TWICE DAILY What changed:  how much to take how to take this when to take this additional instructions   potassium chloride SA 20 MEQ tablet Commonly known as: KLOR-CON  M Take 2 tablets (40 mEq total) by mouth daily.   traZODone 50 MG tablet Commonly known as: DESYREL Take 50 mg by mouth at bedtime.        Allergies:  Allergies  Allergen Reactions   Crestor [Rosuvastatin] Other (See Comments)    Nightmares    Sertraline Diarrhea  Zocor [Simvastatin] Other (See Comments)    Nightmares     Past Medical History:  Diagnosis Date   Anxiety    Bunion 09/29/2011   Cancer (HCC)    nose/Dr. Albertini;skin   Chronic constipation    Chronic kidney disease    stage 111   Coronary atherosclerosis of native coronary artery    2009 LAD CIRC DES   Depression    doesn't take any meds for this   Diabetes mellitus    takes Januvia daily   Dizziness    Enlarged prostate    but doesn't require meds at present   GERD (gastroesophageal reflux disease)    TUMS prn   H/O hiatal hernia    Headache(784.0)    History of gout    doesn't require meds   Hx of cardiac cath    Hyperlipidemia    Crestor 3 x wk and Zetia daily   Hypertension    takes Hyzaar daily   Insomnia    doesn't require meds at present time   Joint pain    Onychomycosis 10/05/2012   x 10   PONV (postoperative nausea and vomiting)    pt states extremely sick   Shortness of breath    with exertion    Past Surgical History:  Procedure Laterality Date   bladder cancer     CARDIAC CATHETERIZATION  2013   cataracts removed     CHOLECYSTECTOMY     COLONOSCOPY     CORONARY ANGIOPLASTY     2 stents from 2009   CORONARY ARTERY BYPASS GRAFT  10/29/2011   Procedure: CORONARY ARTERY BYPASS GRAFTING (CABG);  Surgeon: Kerin Perna, MD;  Location: [facility] OR;  Service: Open Heart Surgery;  Laterality: N/A;   cyst removed from finger     right hand   EYE SURGERY     bilateral cataracts   HEMORRHOID SURGERY     HERNIA REPAIR     x 2;inguinal   KIDNEY STONE SURGERY     LEFT HEART CATHETERIZATION WITH CORONARY ANGIOGRAM N/A 10/21/2011   Procedure: LEFT HEART CATHETERIZATION WITH  CORONARY ANGIOGRAM;  Surgeon: Donato Schultz, MD;  Location: [facility] CATH LAB;  Service: Cardiovascular;  Laterality: N/A;   rotator cuff surgery     right    skin cancer removed      Family History  Problem Relation Age of Onset   Heart disease Mother    Stroke Father    Cancer Brother     Social History:  reports that he quit smoking about 38 years ago. His smoking use included cigarettes. He has a 45.00 pack-year smoking history. He has never used smokeless tobacco. He reports that he does not drink alcohol and does not use drugs.  Review of Systems:  HYPERTENSION:  followed by PCP and cardiologist and blood pressure is recently controlled  Currently only on Apresoline  BP Readings from Last 3 Encounters:  08/12/22 110/80  07/30/22 120/60  07/14/22 124/66    RENAL dysfunction: Has mild persistent increase in creatinine with some variability  Lab Results  Component Value Date   CREATININE 1.79 (H) 08/10/2022   CREATININE 1.69 (H) 08/06/2022   CREATININE 1.69 (H) 07/30/2022     HYPERLIPIDEMIA: Previously on Crestor, recently has not been taking this after discharge  Lipids followed by PCP and cardiologist Most recent LDL was 57 as of 05/04/2022  Lab Results  Component Value Date   CHOL 126 08/21/2019   HDL 38.40 (L) 08/21/2019  LDLCALC 50 08/21/2019   TRIG 187.0 (H) 08/21/2019   CHOLHDL 3 08/21/2019    Thyroid nodule: Longstanding history of small nodule  Lab Results  Component Value Date   TSH 1.70 06/18/2020     Had a follow-up up  ultrasound in December 2017 done by his PCP, this was stable He also has a 12 mm probably pseudo-nodule present  He has regular foot exams with the podiatrist     Examination:   BP 110/80 (BP Location: Right Arm, Patient Position: Sitting, Cuff Size: Normal)   Pulse (!) 48   Ht 5' 3.75" (1.619 m)   Wt 149 lb 3.2 oz (67.7 kg)   SpO2 92%   BMI 25.81 kg/m   Body mass index is 25.81 kg/m.  Marland Kitchen No thyroid nodule  palpable   ASSESSMENT/ PLAN:   Diabetes type 2 with mild obesity   See history of present illness for detailed discussion of current diabetes management, blood sugar patterns and problems identified  A1c is 6.2 and stable  This is despite not being on Prandin for at least a month He has not checked his blood sugars after meals much but does not appear to have any high readings this month  Given his comorbid conditions and relatively poor prognosis will not treat him with pharmacological agents He does need to periodically check his blood sugars including after meals and let us know if they are consistently high  He will follow-up with his cardiologist regarding his starting back on Crestor  Prior history of small thyroid nodule: Not palpable today  Follow-up as needed  There are no Patient Instructions on file for this visit.    Reather Littler 08/12/2022, 10:34 AM

## 2022-08-17 ENCOUNTER — Other Ambulatory Visit (HOSPITAL_COMMUNITY): Payer: Self-pay | Admitting: Cardiology

## 2022-08-17 MED ORDER — HYDRALAZINE HCL 25 MG PO TABS: 25.0000 mg | ORAL_TABLET | Freq: Three times a day (TID) | ORAL | 0 refills | Status: DC

## 2022-08-17 NOTE — Telephone Encounter (Signed)
Meds refilled until pt is seen by Dr Anne Fu (hydralazine)  Pts daughter aware add'l cardiology refills will need to come from Dr Anne Fu

## 2022-08-31 NOTE — Progress Notes (Unsigned)
Cardiology Office Note:    Date:  09/01/2022  ID:  Cody Thomas, DOB 1930/12/28, MRN [id number] PCP: Gweneth Dimitri, MD  [facility] Providers Cardiologist:  Donato Schultz, MD       Patient Profile:      Coronary artery disease  History of PCI in 2009 S/p CABG 10/2011 Permanent Atrial fibrillation  Monitor 05/2019: Atrial flutter with variable conduction, multiple episodes NSVT (longest 9 seconds); postconversion pauses up to 3 seconds Seen by EP (Dr. Graciela Husbands) - watchful waiting vs ILR >> Pt opted for [facility] HFmrEF (heart failure with mildly reduced ejection fraction)  TTE 03/2019: EF 60-65 TTE 07/10/2022: EF 40-45, global HK, moderate LVH, moderately reduced RVSF, severe pulmonary hypertension, RVSP 63.4, severe BAE, moderate MR, moderate TR, trivial AI, AV sclerosis, RAP 15 Mitral regurgitation  Pulmonary hypertension  Chronic kidney disease  Hypertension  Diabetes mellitus   Carotid stenosis Carotid US 03/20/2019: Bilateral ICA 1-39 OSA  Bradycardia  No beta-blocker; hx of pauses Former smoker      History of Present Illness:   Cody Thomas is a 89 y.o. male returns for posthospitalization follow-up.  He was last seen by Dr. Anne Fu in September 2023.  He was admitted to [facility] 5/10-5/14 with acute heart failure.  Echocardiogram demonstrated newly reduced ejection fraction at 40-45%.  He was followed by cardiology.  He was diuresed with IV Lasix.  GDMT included hydralazine, nitrates, metoprolol.  His ARB was discontinued in the setting of elevated creatinine.  He had follow-up in the heart failure clinic.  He is here today with his son.  Since discharge from the hospital, he has been on continuous O2.  Overall, he feels that his breathing is much better.  His weights have been stable.  He had 1 episode of chest discomfort a couple of weeks ago that was consistent with acid reflux.  He has not had exertional chest pain.  He has not had syncope, orthopnea.  He is wearing  compression hose and has minimal edema.  He had dizziness on 1 occasion with standing.  He recently had skin cancer removed from his head.  Review of Systems  Gastrointestinal:  Negative for hematochezia and melena.  Genitourinary:  Negative for hematuria.   See HPI    Studies Reviewed:       Risk Assessment/Calculations:   CHA2DS2-VASc Score = 6   This indicates a 9.7% annual risk of stroke. The patient's score is based upon: CHF History: 1 HTN History: 1 Diabetes History: 1 Stroke History: 0 Vascular Disease History: 1 Age Score: 2 Gender Score: 0            Physical Exam:   VS:  BP 110/60   Pulse 64   Ht 5\' 3"  (1.6 m)   Wt 147 lb (66.7 kg)   SpO2 96%   BMI 26.04 kg/m    Wt Readings from Last 3 Encounters:  09/01/22 147 lb (66.7 kg)  08/12/22 149 lb 3.2 oz (67.7 kg)  07/30/22 157 lb (71.2 kg)    Constitutional:      Appearance: Healthy appearance. Not in distress.  Neck:     Vascular: JVD normal.  Pulmonary:     Breath sounds: Normal breath sounds. No wheezing. No rales.  Cardiovascular:     Normal rate. Irregularly irregular rhythm.     Murmurs: There is no murmur.  Edema:    Peripheral edema (compression hose in place) absent.  Abdominal:     Palpations: Abdomen is  soft.      ASSESSMENT AND PLAN:   Heart failure with mildly reduced ejection fraction (HFmrEF) (HCC) EF 40-45 by echocardiogram in May 2024 during an admission for acute heart failure.  EF was previously normal.  Echo demonstrated global hypokinesis, severe pulm hypertension with RVSP 63.4 and moderate MR.  Given his advanced age, chronic kidney disease he is not likely an ideal candidate for invasive cardiac evaluation.  I would continue with medical management.  His GFR currently is 36.  Technically, he could be placed on spironolactone, Entresto or SGLT2 inhibitor.  However, I am hesitant to do this given his advanced age.  He seems to be tolerating his current medications well.  Continue  furosemide 40 mg in the morning, 20 mg in the afternoon, hydralazine 25 mg 3 times a day, Imdur 30 mg daily, metoprolol to tartrate 25 mg twice daily, K+ 40 mEq daily.  I will reassess his renal function with a repeat BMET today.  Arrange follow-up limited echocardiogram to reassess LV function in the next 4 to 6 weeks.  Keep follow-up with Dr. Anne Fu in October as planned.  He still has home health coming out.  I will arrange a walk test to see if he still needs oxygen.  CAD (coronary artery disease) History of PCI in 2009 and CABG in 2013.  He has not had any chest discomfort to suggest angina.  He is not on antiplatelet therapy as he is on Eliquis.  He has been intolerant to statins.  Labs from primary care reviewed.  LDL in March 2024 was near ideal at 32.  At this point, I do not think we need to refer him for PCSK9 inhibitor.  Permanent atrial fibrillation (HCC) Rate is controlled.  He has had a history of postconversion pauses in the past and was seen by EP.  He was back on low-dose beta-blocker for advancement of GDMT for heart failure.  He seems to be tolerating this well without any recurrent symptoms of syncope or near syncope.  Continue Eliquis 2.5 mg twice daily (age, creatinine).  Essential hypertension Blood pressure is controlled.  Continue hydralazine 25 mg 3 times a day, Imdur 30 mg daily, metoprolol tartrate 25 mg twice daily.   CKD (chronic kidney disease) stage 3, GFR 30-59 ml/min Repeat BMET today.    Dispo:  Return in about 3 months (around 12/02/2022) for Scheduled Follow Up with Dr. Anne Fu.  Signed, Tereso Newcomer, PA-C

## 2022-09-01 ENCOUNTER — Ambulatory Visit: Payer: Medicare Other | Attending: Physician Assistant | Admitting: Physician Assistant

## 2022-09-01 ENCOUNTER — Encounter: Payer: Self-pay | Admitting: Physician Assistant

## 2022-09-01 VITALS — BP 110/60 | HR 64 | Ht 63.0 in | Wt 147.0 lb

## 2022-09-01 DIAGNOSIS — I5022 Chronic systolic (congestive) heart failure: Secondary | ICD-10-CM

## 2022-09-01 DIAGNOSIS — N1832 Chronic kidney disease, stage 3b: Secondary | ICD-10-CM

## 2022-09-01 DIAGNOSIS — I2510 Atherosclerotic heart disease of native coronary artery without angina pectoris: Secondary | ICD-10-CM | POA: Diagnosis not present

## 2022-09-01 DIAGNOSIS — I4821 Permanent atrial fibrillation: Secondary | ICD-10-CM

## 2022-09-01 DIAGNOSIS — I10 Essential (primary) hypertension: Secondary | ICD-10-CM | POA: Diagnosis not present

## 2022-09-01 LAB — BASIC METABOLIC PANEL
BUN/Creatinine Ratio: 17 (ref 10–24)
BUN: 30 mg/dL — ABNORMAL HIGH (ref 8–27)
CO2: 26 mmol/L (ref 20–29)
Calcium: 9.9 mg/dL (ref 8.6–10.2)
Chloride: 95 mmol/L — ABNORMAL LOW (ref 96–106)
Creatinine, Ser: 1.81 mg/dL — ABNORMAL HIGH (ref 0.76–1.27)
Glucose: 111 mg/dL — ABNORMAL HIGH (ref 70–99)
Potassium: 4.4 mmol/L (ref 3.5–5.2)
Sodium: 134 mmol/L (ref 134–144)
eGFR: 35 mL/min/{1.73_m2} — ABNORMAL LOW (ref >59)

## 2022-09-01 NOTE — Patient Instructions (Signed)
Medication Instructions:  Your physician recommends that you continue on your current medications as directed. Please refer to the Current Medication list given to you today.  *If you need a refill on your cardiac medications before your next appointment, please call your pharmacy*   Lab Work: BMET today.  If you have labs (blood work) drawn today and your tests are completely normal, you will receive your results only by: MyChart Message (if you have MyChart) OR A paper copy in the mail If you have any lab test that is abnormal or we need to change your treatment, we will call you to review the results.   Testing/Procedures: Your physician has requested that you have an limited echocardiogram in August 2024. Echocardiography is a painless test that uses sound waves to create images of your heart. It provides your doctor with information about the size and shape of your heart and how well your heart's chambers and valves are working. This procedure takes approximately one hour. There are no restrictions for this procedure. Please do NOT wear cologne, perfume, aftershave, or lotions (deodorant is allowed). Please arrive 15 minutes prior to your appointment time.    Follow-Up: At [facility], you and your health needs are our priority.  As part of our continuing mission to provide you with exceptional heart care, we have created designated Provider Care Teams.  These Care Teams include your primary Cardiologist (physician) and Advanced Practice Providers (APPs -  Physician Assistants and Nurse Practitioners) who all work together to provide you with the care you need, when you need it.  We recommend signing up for the patient portal called "MyChart".  Sign up information is provided on this After Visit Summary.  MyChart is used to connect with patients for Virtual Visits (Telemedicine).  Patients are able to view lab/test results, encounter notes, upcoming appointments, etc.   Non-urgent messages can be sent to your provider as well.   To learn more about what you can do with MyChart, go to [email or web address].    Your next appointment:   As scheduled   Provider:   Donato Schultz, MD     Other Instructions Please call [phone] when you get home with home health agency information.

## 2022-09-01 NOTE — Assessment & Plan Note (Signed)
Blood pressure is controlled.  Continue hydralazine 25 mg 3 times a day, Imdur 30 mg daily, metoprolol tartrate 25 mg twice daily.

## 2022-09-01 NOTE — Assessment & Plan Note (Signed)
Repeat BMET today.  

## 2022-09-01 NOTE — Assessment & Plan Note (Signed)
History of PCI in 2009 and CABG in 2013.  He has not had any chest discomfort to suggest angina.  He is not on antiplatelet therapy as he is on Eliquis.  He has been intolerant to statins.  Labs from primary care reviewed.  LDL in March 2024 was near ideal at 41.  At this point, I do not think we need to refer him for PCSK9 inhibitor.

## 2022-09-01 NOTE — Assessment & Plan Note (Signed)
Rate is controlled.  He has had a history of postconversion pauses in the past and was seen by EP.  He was back on low-dose beta-blocker for advancement of GDMT for heart failure.  He seems to be tolerating this well without any recurrent symptoms of syncope or near syncope.  Continue Eliquis 2.5 mg twice daily (age, creatinine).

## 2022-09-01 NOTE — Assessment & Plan Note (Addendum)
EF 40-45 by echocardiogram in May 2024 during an admission for acute heart failure.  EF was previously normal.  Echo demonstrated global hypokinesis, severe pulm hypertension with RVSP 63.4 and moderate MR.  Given his advanced age, chronic kidney disease he is not likely an ideal candidate for invasive cardiac evaluation.  I would continue with medical management.  His GFR currently is 36.  Technically, he could be placed on spironolactone, Entresto or SGLT2 inhibitor.  However, I am hesitant to do this given his advanced age.  He seems to be tolerating his current medications well.  Continue furosemide 40 mg in the morning, 20 mg in the afternoon, hydralazine 25 mg 3 times a day, Imdur 30 mg daily, metoprolol to tartrate 25 mg twice daily, K+ 40 mEq daily.  I will reassess his renal function with a repeat BMET today.  Arrange follow-up limited echocardiogram to reassess LV function in the next 4 to 6 weeks.  Keep follow-up with Dr. Anne Fu in October as planned.  He still has home health coming out.  I will arrange a walk test to see if he still needs oxygen.

## 2022-09-02 ENCOUNTER — Telehealth: Payer: Self-pay

## 2022-09-02 NOTE — Telephone Encounter (Signed)
Called to review labs with patinet. Patient verbalizes understanding that Renal function is stable.  K+ normal. Patient agrees to continue current medications/treatment plan and follow up as scheduled.

## 2022-09-02 NOTE — Telephone Encounter (Signed)
-----   Message from Beatrice Lecher, [place] sent at 09/02/2022  4:48 PM EDT ----- Renal function stable.  K+ normal. PLAN:  - Continue current medications/treatment plan and follow up as scheduled.  Tereso Newcomer, PA-C    09/02/2022 4:48 PM

## 2022-09-08 ENCOUNTER — Other Ambulatory Visit (HOSPITAL_COMMUNITY): Payer: Self-pay | Admitting: Cardiology

## 2022-09-12 ENCOUNTER — Other Ambulatory Visit (HOSPITAL_COMMUNITY): Payer: Self-pay | Admitting: Cardiology

## 2022-09-13 ENCOUNTER — Other Ambulatory Visit: Payer: Self-pay | Admitting: Cardiology

## 2022-09-14 ENCOUNTER — Other Ambulatory Visit: Payer: Self-pay | Admitting: *Deleted

## 2022-09-14 MED ORDER — ISOSORBIDE MONONITRATE ER 30 MG PO TB24: 30.0000 mg | ORAL_TABLET | Freq: Every day | ORAL | 3 refills | Status: DC

## 2022-09-19 ENCOUNTER — Other Ambulatory Visit (HOSPITAL_COMMUNITY): Payer: Self-pay | Admitting: Cardiology

## 2022-10-02 ENCOUNTER — Ambulatory Visit (HOSPITAL_COMMUNITY): Payer: Medicare Other | Attending: Physician Assistant

## 2022-10-02 DIAGNOSIS — I4821 Permanent atrial fibrillation: Secondary | ICD-10-CM | POA: Insufficient documentation

## 2022-10-02 DIAGNOSIS — I2510 Atherosclerotic heart disease of native coronary artery without angina pectoris: Secondary | ICD-10-CM | POA: Insufficient documentation

## 2022-10-02 DIAGNOSIS — I5022 Chronic systolic (congestive) heart failure: Secondary | ICD-10-CM | POA: Insufficient documentation

## 2022-10-02 LAB — ECHOCARDIOGRAM LIMITED
MV M vel: 4.54 m/s
MV Peak grad: 82.4 mmHg
P 1/2 time: 682 msec
S' Lateral: 2.9 cm

## 2022-10-04 ENCOUNTER — Encounter: Payer: Self-pay | Admitting: Physician Assistant

## 2022-10-09 ENCOUNTER — Telehealth: Payer: Self-pay | Admitting: Endocrinology

## 2022-10-09 ENCOUNTER — Other Ambulatory Visit: Payer: Self-pay

## 2022-10-09 DIAGNOSIS — E1165 Type 2 diabetes mellitus with hyperglycemia: Secondary | ICD-10-CM

## 2022-10-09 MED ORDER — BLOOD GLUCOSE TEST STRIPS 333 VI STRP
1.0000 | ORAL_STRIP | Freq: Three times a day (TID) | 0 refills | Status: DC
Start: 2022-10-09 — End: 2022-10-09

## 2022-10-09 MED ORDER — BLOOD GLUCOSE TEST STRIPS 333 VI STRP
1.0000 | ORAL_STRIP | Freq: Three times a day (TID) | 0 refills | Status: AC
Start: 2022-10-09 — End: ?

## 2022-10-09 NOTE — Telephone Encounter (Signed)
MEDICATION:  glucose blood test strip PHARMACY:    HAS THE PATIENT CONTACTED THEIR PHARMACY?    [id number] - [place], [place] - [address] (Ph: [phone])    IS THIS A 90 DAY SUPPLY : Yes  IS PATIENT OUT OF MEDICATION: No  IF NOT; HOW MUCH IS LEFT: A few  LAST APPOINTMENT DATE: @6 /01/2023  NEXT APPOINTMENT DATE:@Visit  date not found  DO WE HAVE YOUR PERMISSION TO LEAVE A DETAILED MESSAGE?:  Yes  OTHER COMMENTS:    **Let patient know to contact pharmacy at the end of the day to make sure medication is ready. **  ** Please notify patient to allow 48-72 hours to process**  **Encourage patient to contact the pharmacy for refills or they can request refills through [facility]**

## 2022-10-09 NOTE — Telephone Encounter (Signed)
[facility]

## 2022-10-14 ENCOUNTER — Encounter: Payer: Self-pay | Admitting: Podiatry

## 2022-10-14 ENCOUNTER — Other Ambulatory Visit: Payer: Self-pay | Admitting: Cardiology

## 2022-10-14 ENCOUNTER — Ambulatory Visit (INDEPENDENT_AMBULATORY_CARE_PROVIDER_SITE_OTHER): Payer: Medicare Other | Admitting: Podiatry

## 2022-10-14 DIAGNOSIS — M79675 Pain in left toe(s): Secondary | ICD-10-CM

## 2022-10-14 DIAGNOSIS — B351 Tinea unguium: Secondary | ICD-10-CM

## 2022-10-14 DIAGNOSIS — B353 Tinea pedis: Secondary | ICD-10-CM | POA: Diagnosis not present

## 2022-10-14 DIAGNOSIS — E1121 Type 2 diabetes mellitus with diabetic nephropathy: Secondary | ICD-10-CM

## 2022-10-14 DIAGNOSIS — M79674 Pain in right toe(s): Secondary | ICD-10-CM | POA: Diagnosis not present

## 2022-10-14 MED ORDER — MICONAZOLE NITRATE 2 % EX AERO: INHALATION_SPRAY | CUTANEOUS | 0 refills | Status: DC

## 2022-10-19 NOTE — Progress Notes (Signed)
  Subjective:  Patient ID: Cody Thomas, male    DOB: [birth year],  MRN: [id number]  Cody Thomas presents to clinic today for at risk foot care. Pt has h/o NIDDM with chronic kidney disease and painful thick toenails that are difficult to trim. Pain interferes with ambulation. Aggravating factors include wearing enclosed shoe gear. Pain is relieved with periodic professional debridement.   New problem(s): None.   PCP is Gweneth Dimitri, MD.  Allergies  Allergen Reactions   Crestor [Rosuvastatin] Other (See Comments)    Nightmares    Sertraline Diarrhea   Zocor [Simvastatin] Other (See Comments)    Nightmares     Review of Systems: Negative except as noted in the HPI.  Objective: No changes noted in today's physical examination. There were no vitals filed for this visit. Cody Thomas is a pleasant 89 y.o. male in NAD. AAO x 3.  Vascular Examination: CFT immediate b/l LE. Faintly palpable DP/PT pulses b/l LE. Digital hair sparse b/l. Skin temperature gradient WNL b/l. No pain with calf compression b/l. No edema noted b/l. No cyanosis or clubbing noted b/l LE.  Dermatological Examination: Pedal skin thin, shiny and atrophic b/l LE.  No open wounds b/l. Toenails 1-5 b/l elongated, thickened, discolored with subungual debris. +Tenderness with dorsal palpation of nailplates. No hyperkeratotic or porokeratotic lesions present.  Interdigital maceration noted bilateral 1st-4th bilateral webspace(s). No blistering, no weeping, no open wounds.  Musculoskeletal Examination: Normal muscle strength 5/5 to all lower extremity muscle groups bilaterally. HAV with bunion deformity noted b/l LE.Marland Kitchen No pain, crepitus or joint limitation noted with ROM b/l LE.  Patient ambulates independently without assistive aids.  Neurological Examination: Protective sensation intact 5/5 intact bilaterally with 10g monofilament b/l. Vibratory sensation intact b/l.  Assessment/Plan: 1. Pain due to  onychomycosis of toenails of both feet   2. Diabetic nephropathy associated with type 2 diabetes mellitus (HCC)     Meds ordered this encounter  Medications   Miconazole Nitrate 2 % AERO    Sig: Spray between toes once daily.    Dispense:  150 g    Refill:  0    -Consent given for treatment as described below: -Examined patient. -Continue foot and shoe inspections daily. Monitor blood glucose per PCP/Endocrinologist's recommendations. -Patient to continue soft, supportive shoe gear daily. -Toenails 1-5 b/l were debrided in length and girth with sterile nail nippers and dremel without iatrogenic bleeding.  -Rx written for Miconazole Spray Powder 2% antifungal spray powder. Spray between toes once daily. Dry well between toes and under toes after bath/shower. -Patient/POA to call should there be question/concern in the interim.   Return in about 3 months (around 01/14/2023).  Cody Thomas, DPM

## 2022-11-26 ENCOUNTER — Telehealth: Payer: Self-pay | Admitting: Endocrinology

## 2022-11-26 NOTE — Telephone Encounter (Signed)
MEDICATION: Contour Meter  PHARMACY:  [id number] - [place], [place] - [address] (Ph: [phone])   HAS THE PATIENT CONTACTED THEIR PHARMACY?  Yes  IS THIS A 90 DAY SUPPLY : Yes  IS PATIENT OUT OF MEDICATION: No  IF NOT; HOW MUCH IS LEFT:   LAST APPOINTMENT DATE: @6 /01/2023  NEXT APPOINTMENT DATE:@Visit  date not found  DO WE HAVE YOUR PERMISSION TO LEAVE A DETAILED MESSAGE?:Yes  OTHER COMMENTS: [facility] in order to use the [employer] Test strips that he would need a new meter.   **Let patient know to contact pharmacy at the end of the day to make sure medication is ready. **  ** Please notify patient to allow 48-72 hours to process**  **Encourage patient to contact the pharmacy for refills or they can request refills through [facility]**

## 2022-11-27 ENCOUNTER — Other Ambulatory Visit: Payer: Self-pay

## 2022-11-27 DIAGNOSIS — Z794 Long term (current) use of insulin: Secondary | ICD-10-CM

## 2022-11-27 MED ORDER — CONTOUR NEXT GEN MONITOR W/DEVICE KIT
1.0000 | PACK | Freq: Once | 0 refills | Status: AC
Start: 2022-11-27 — End: 2022-11-27

## 2022-11-27 NOTE — Telephone Encounter (Signed)
Contour Meter Kit [facility]

## 2022-12-10 ENCOUNTER — Encounter: Payer: Self-pay | Admitting: Cardiology

## 2022-12-10 ENCOUNTER — Ambulatory Visit: Payer: Medicare Other | Attending: Cardiology | Admitting: Cardiology

## 2022-12-10 VITALS — BP 102/56 | HR 61 | Ht 63.0 in | Wt 149.6 lb

## 2022-12-10 DIAGNOSIS — I5022 Chronic systolic (congestive) heart failure: Secondary | ICD-10-CM | POA: Diagnosis not present

## 2022-12-10 DIAGNOSIS — N1832 Chronic kidney disease, stage 3b: Secondary | ICD-10-CM | POA: Diagnosis not present

## 2022-12-10 DIAGNOSIS — I4821 Permanent atrial fibrillation: Secondary | ICD-10-CM | POA: Diagnosis not present

## 2022-12-10 DIAGNOSIS — I2510 Atherosclerotic heart disease of native coronary artery without angina pectoris: Secondary | ICD-10-CM

## 2022-12-10 DIAGNOSIS — Z7901 Long term (current) use of anticoagulants: Secondary | ICD-10-CM

## 2022-12-10 MED ORDER — POTASSIUM CHLORIDE CRYS ER 20 MEQ PO TBCR: 20.0000 meq | EXTENDED_RELEASE_TABLET | Freq: Every day | ORAL | 3 refills | Status: AC

## 2022-12-10 NOTE — Patient Instructions (Signed)
Medication Instructions:  Please decrease your Potassium to 20 mEq daily (1 tablet) Continue all other medications as listed.  *If you need a refill on your cardiac medications before your next appointment, please call your pharmacy*  Follow-Up: At [facility] you and your health needs are our priority.  As part of our continuing mission to provide you with exceptional heart care, we have created designated Provider Care Teams.  These Care Teams include your primary Cardiologist (physician) and Advanced Practice Providers (APPs -  Physician Assistants and Nurse Practitioners) who all work together to provide you with the care you need, when you need it.  We recommend signing up for the patient portal called "MyChart".  Sign up information is provided on this After Visit Summary.  MyChart is used to connect with patients for Virtual Visits (Telemedicine).  Patients are able to view lab/test results, encounter notes, upcoming appointments, etc.  Non-urgent messages can be sent to your provider as well.   To learn more about what you can do with MyChart, go to [email or web address].    Your next appointment:   6 month(s)  Provider:   Tereso Newcomer, PA-C     Then, Donato Schultz, MD will plan to see you again in 1 year(s).

## 2022-12-10 NOTE — Progress Notes (Signed)
Cardiology Office Note:  .   Date:  12/10/2022  ID:  Cody Thomas, DOB 11/08/1930, MRN [id number] PCP: Gweneth Dimitri, MD  [facility] Providers Cardiologist:  Donato Schultz, MD     History of Present Illness: .   Cody Thomas is a 89 y.o. male Discussed with the use of AI scribe   History of Present Illness   The patient, a 89 year old with a history of chronic systolic heart failure (EF 40-45%), coronary artery disease (history of PCI in 2009 and CABG in 2013), permanent atrial fibrillation, and chronic kidney disease (GFR around 57), presents for a follow-up visit. He is on goal-directed medical therapy including hydralazine, nitrates, metoprolol, furosemide, isosorbide, metoprolol, and potassium supplementation. An ARB was previously discontinued due to elevated creatinine. The patient also has a history of skin cancer, which was removed from his head.  The patient's heart failure is managed with medications, and he has been seen by the heart failure clinic in the past. He also has pulmonary hypertension with an RVSP of 63 and moderate mitral regurgitation. Due to his GFR, there is hesitancy to utilize Entresto, SGLT2 inhibitor, or spironolactone. The patient's atrial fibrillation is rate-controlled, and he is on Eliquis for chronic anticoagulation.  The patient's last echocardiogram showed an EF of 45-50%, a mild improvement from prior, and both atria are severely dilated.          ROS: Recent fall over O2 hose, easy bruising  Studies Reviewed: .        Results LABS LDL: 57 (05/2022) Potassium: 4.4  DIAGNOSTIC Echocardiogram: EF 45-50%, severely dilated atria (10/02/2022)  Risk Assessment/Calculations:            Physical Exam:   VS:  BP (!) 102/56   Pulse 61   Ht 5\' 3"  (1.6 m)   Wt 149 lb 9.6 oz (67.9 kg)   SpO2 94%   BMI 26.50 kg/m    Wt Readings from Last 3 Encounters:  12/10/22 149 lb 9.6 oz (67.9 kg)  09/01/22 147 lb (66.7 kg)  08/12/22  149 lb 3.2 oz (67.7 kg)    GEN: Well nourished, well developed in no acute distress, Cane NECK: No JVD; No carotid bruits CARDIAC: IRRR, no murmurs, no rubs, no gallops RESPIRATORY:  Clear to auscultation without rales, wheezing or rhonchi  ABDOMEN: Soft, non-tender, non-distended EXTREMITIES:  No edema; No deformity, bruises  ASSESSMENT AND PLAN: .       Chronic Systolic Heart Failure EF 45-50%, improved from prior. Tolerating current regimen of hydralazine 25mg  TID, isosorbide 30mg  daily, metoprolol 25mg  BID, and furosemide 40mg  AM and 20mg  PM. -Continue current regimen.  Hypokalemia Potassium level at 4.4, previously as low as 2.9. Currently on potassium supplementation daily. -Reduce potassium supplementation to daily.  Coronary Artery Disease History of PCI in 2009 and CABG in 2013. No current anginal symptoms. Intolerant to statins. LDL 57 in March 2024. -Continue current management.  Atrial Fibrillation Long-standing persistent, rate-controlled. On Eliquis 2.5mg  BID for chronic anticoagulation. -Continue Eliquis 2.5mg  BID.  Chronic Kidney Disease Stage 3, GFR around 36. -Continue current management.  Follow-up in 6 months with [name].              Signed, Donato Schultz, MD

## 2022-12-11 ENCOUNTER — Other Ambulatory Visit: Payer: Self-pay | Admitting: Physician Assistant

## 2022-12-17 ENCOUNTER — Emergency Department (HOSPITAL_COMMUNITY)
Admission: EM | Admit: 2022-12-17 | Discharge: 2022-12-17 | Disposition: A | Discharge status: Home / Self Care | Payer: Medicare Other | Attending: Emergency Medicine | Admitting: Emergency Medicine

## 2022-12-17 ENCOUNTER — Other Ambulatory Visit: Payer: Self-pay

## 2022-12-17 ENCOUNTER — Emergency Department (HOSPITAL_COMMUNITY): Payer: Medicare Other

## 2022-12-17 ENCOUNTER — Encounter (HOSPITAL_COMMUNITY): Payer: Self-pay

## 2022-12-17 DIAGNOSIS — W19XXXA Unspecified fall, initial encounter: Secondary | ICD-10-CM

## 2022-12-17 DIAGNOSIS — Z20822 Contact with and (suspected) exposure to covid-19: Secondary | ICD-10-CM | POA: Diagnosis not present

## 2022-12-17 DIAGNOSIS — I2510 Atherosclerotic heart disease of native coronary artery without angina pectoris: Secondary | ICD-10-CM | POA: Diagnosis not present

## 2022-12-17 DIAGNOSIS — N189 Chronic kidney disease, unspecified: Secondary | ICD-10-CM | POA: Diagnosis not present

## 2022-12-17 DIAGNOSIS — E1122 Type 2 diabetes mellitus with diabetic chronic kidney disease: Secondary | ICD-10-CM | POA: Insufficient documentation

## 2022-12-17 DIAGNOSIS — Z79899 Other long term (current) drug therapy: Secondary | ICD-10-CM | POA: Diagnosis not present

## 2022-12-17 DIAGNOSIS — I130 Hypertensive heart and chronic kidney disease with heart failure and stage 1 through stage 4 chronic kidney disease, or unspecified chronic kidney disease: Secondary | ICD-10-CM | POA: Insufficient documentation

## 2022-12-17 DIAGNOSIS — Z7901 Long term (current) use of anticoagulants: Secondary | ICD-10-CM | POA: Insufficient documentation

## 2022-12-17 DIAGNOSIS — I509 Heart failure, unspecified: Secondary | ICD-10-CM | POA: Insufficient documentation

## 2022-12-17 DIAGNOSIS — Z23 Encounter for immunization: Secondary | ICD-10-CM | POA: Diagnosis not present

## 2022-12-17 DIAGNOSIS — R55 Syncope and collapse: Secondary | ICD-10-CM | POA: Diagnosis not present

## 2022-12-17 DIAGNOSIS — R42 Dizziness and giddiness: Secondary | ICD-10-CM | POA: Insufficient documentation

## 2022-12-17 LAB — URINALYSIS, W/ REFLEX TO CULTURE (INFECTION SUSPECTED)
Bilirubin Urine: NEGATIVE
Glucose, UA: NEGATIVE mg/dL
Hgb urine dipstick: NEGATIVE
Ketones, ur: NEGATIVE mg/dL
Leukocytes,Ua: NEGATIVE
Nitrite: NEGATIVE
Protein, ur: NEGATIVE mg/dL
Specific Gravity, Urine: 1.014 (ref 1.005–1.030)
pH: 6 (ref 5.0–8.0)

## 2022-12-17 LAB — CBC WITH DIFFERENTIAL/PLATELET
Abs Immature Granulocytes: 0.05 10*3/uL (ref 0.00–0.07)
Basophils Absolute: 0 10*3/uL (ref 0.0–0.1)
Basophils Relative: 0 %
Eosinophils Absolute: 0 10*3/uL (ref 0.0–0.5)
Eosinophils Relative: 0 %
HCT: 41.3 % (ref 39.0–52.0)
Hemoglobin: 13.8 g/dL (ref 13.0–17.0)
Immature Granulocytes: 0 %
Lymphocytes Relative: 5 %
Lymphs Abs: 0.6 10*3/uL — ABNORMAL LOW (ref 0.7–4.0)
MCH: 33.8 pg (ref 26.0–34.0)
MCHC: 33.4 g/dL (ref 30.0–36.0)
MCV: 101.2 fL — ABNORMAL HIGH (ref 80.0–100.0)
Monocytes Absolute: 0.8 10*3/uL (ref 0.1–1.0)
Monocytes Relative: 7 %
Neutro Abs: 9.9 10*3/uL — ABNORMAL HIGH (ref 1.7–7.7)
Neutrophils Relative %: 88 %
Platelets: 152 10*3/uL (ref 150–400)
RBC: 4.08 MIL/uL — ABNORMAL LOW (ref 4.22–5.81)
RDW: 12.4 % (ref 11.5–15.5)
WBC: 11.4 10*3/uL — ABNORMAL HIGH (ref 4.0–10.5)
nRBC: 0 % (ref 0.0–0.2)

## 2022-12-17 LAB — COMPREHENSIVE METABOLIC PANEL
ALT: 14 U/L (ref 0–44)
AST: 24 U/L (ref 15–41)
Albumin: 3.4 g/dL — ABNORMAL LOW (ref 3.5–5.0)
Alkaline Phosphatase: 48 U/L (ref 38–126)
Anion gap: 11 (ref 5–15)
BUN: 22 mg/dL (ref 8–23)
CO2: 24 mmol/L (ref 22–32)
Calcium: 9 mg/dL (ref 8.9–10.3)
Chloride: 101 mmol/L (ref 98–111)
Creatinine, Ser: 1.55 mg/dL — ABNORMAL HIGH (ref 0.61–1.24)
GFR, Estimated: 42 mL/min — ABNORMAL LOW (ref >60)
Glucose, Bld: 144 mg/dL — ABNORMAL HIGH (ref 70–99)
Potassium: 3.7 mmol/L (ref 3.5–5.1)
Sodium: 136 mmol/L (ref 135–145)
Total Bilirubin: 1.4 mg/dL — ABNORMAL HIGH (ref 0.3–1.2)
Total Protein: 6.5 g/dL (ref 6.5–8.1)

## 2022-12-17 LAB — RESP PANEL BY RT-PCR (RSV, FLU A&B, COVID)  RVPGX2
Influenza A by PCR: NEGATIVE
Influenza B by PCR: NEGATIVE
Resp Syncytial Virus by PCR: NEGATIVE
SARS Coronavirus 2 by RT PCR: NEGATIVE

## 2022-12-17 LAB — BRAIN NATRIURETIC PEPTIDE: B Natriuretic Peptide: 768.9 pg/mL — ABNORMAL HIGH (ref 0.0–100.0)

## 2022-12-17 LAB — MAGNESIUM: Magnesium: 1.9 mg/dL (ref 1.7–2.4)

## 2022-12-17 LAB — TSH: TSH: 1.145 u[IU]/mL (ref 0.350–4.500)

## 2022-12-17 LAB — TROPONIN I (HIGH SENSITIVITY)
Troponin I (High Sensitivity): 22 ng/L — ABNORMAL HIGH (ref <18)
Troponin I (High Sensitivity): 21 ng/L — ABNORMAL HIGH (ref <18)

## 2022-12-17 MED ORDER — TETANUS-DIPHTH-ACELL PERTUSSIS 5-2.5-18.5 LF-MCG/0.5 IM SUSY
0.5000 mL | PREFILLED_SYRINGE | Freq: Once | INTRAMUSCULAR | Status: AC
  Administered 2022-12-17: 0.5 mL via INTRAMUSCULAR
  Filled 2022-12-17: qty 0.5

## 2022-12-17 NOTE — ED Provider Notes (Signed)
[place] EMERGENCY DEPARTMENT AT [facility] Provider Note   CSN: [id number] Arrival date & time: 12/17/22  1459     History  Chief Complaint  Patient presents with   Fall    On thinners    Cody Thomas is a 89 y.o. male.  The history is provided by the patient and medical records. No language interpreter was used.  Fall This is a new problem. The current episode started 3 to 5 hours ago. The problem occurs rarely. The problem has not changed since onset.Pertinent negatives include no chest pain, no abdominal pain, no headaches and no shortness of breath. Nothing aggravates the symptoms. Nothing relieves the symptoms. He has tried nothing for the symptoms. The treatment provided no relief.       Home Medications Prior to Admission medications   Medication Sig Start Date End Date Taking? Authorizing Provider  apixaban (ELIQUIS) 2.5 MG TABS tablet TAKE 1 TABLET BY MOUTH TWICE A DAY 08/05/22   Jake Bathe, MD  Coenzyme Q10 (COQ10 PO) Take 100 mg by mouth every evening. Co Q 10 100mg /77mL    [provider]  escitalopram (LEXAPRO) 5 MG tablet Take 2.5 mg by mouth every evening. 04/17/21   [provider]  furosemide (LASIX) 40 MG tablet TAKE 1 TABLET (40 MG TOTAL) BY MOUTH EVERY MORNING AND 0.5 TABLETS (20 MG TOTAL) EVERY EVENING. 09/21/22   Jake Bathe, MD  Glucose Blood (BLOOD GLUCOSE TEST STRIPS 333) STRP 1 strip by In Vitro route 3 (three) times daily. 10/09/22   Reather Littler, MD  hydrALAZINE (APRESOLINE) 25 MG tablet TAKE 1 TABLET BY MOUTH THREE TIMES A DAY 09/08/22   Laurey Morale, MD  isosorbide mononitrate (IMDUR) 30 MG 24 hr tablet TAKE 1 TABLET BY MOUTH EVERY DAY 12/11/22   Tereso Newcomer T, PA-C  metoprolol tartrate (LOPRESSOR) 25 MG tablet TAKE 1 TABLET TWICE DAILY 09/14/22   Jake Bathe, MD  Miconazole Nitrate 2 % AERO Spray between toes once daily. 10/14/22   Freddie Breech, DPM  Omega-3 Fatty Acids (FISH OIL PO) Take 2 capsules  by mouth every evening.    [provider]  potassium chloride SA (KLOR-CON M20) 20 MEQ tablet Take 1 tablet (20 mEq total) by mouth daily. 12/10/22   Jake Bathe, MD  traZODone (DESYREL) 50 MG tablet Take 50 mg by mouth at bedtime. 08/22/20   [provider]      Allergies    Crestor [rosuvastatin], Sertraline, and Zocor [simvastatin]    Review of Systems   Review of Systems  Constitutional:  Positive for fatigue. Negative for chills, diaphoresis and fever.  HENT:  Negative for congestion.   Respiratory:  Positive for cough. Negative for chest tightness, shortness of breath and wheezing.   Cardiovascular:  Negative for chest pain, palpitations and leg swelling (chronic and reportedly unchanged).  Gastrointestinal:  Positive for constipation (chronci per pt). Negative for abdominal pain, diarrhea, nausea and vomiting.  Genitourinary:  Negative for dysuria and frequency.  Musculoskeletal:  Negative for back pain (mild and chronic/unchanged frm baseline), neck pain and neck stiffness.  Skin:  Positive for wound.  Neurological:  Positive for light-headedness. Negative for dizziness, syncope, weakness, numbness and headaches.  Psychiatric/Behavioral:  Negative for agitation.   All other systems reviewed and are negative.   Physical Exam Updated Vital Signs There were no vitals taken for this visit. Physical Exam Vitals and nursing note reviewed.  Constitutional:  General: He is not in acute distress.    Appearance: He is well-developed. He is not ill-appearing, toxic-appearing or diaphoretic.  HENT:     Head: Normocephalic and atraumatic.     Nose: No congestion or rhinorrhea.     Mouth/Throat:     Mouth: Mucous membranes are moist.  Eyes:     Extraocular Movements: Extraocular movements intact.     Conjunctiva/sclera: Conjunctivae normal.     Pupils: Pupils are equal, round, and reactive to light.  Cardiovascular:     Rate and Rhythm: Normal rate and  regular rhythm.     Heart sounds: No murmur heard. Pulmonary:     Effort: Pulmonary effort is normal. No respiratory distress.     Breath sounds: Normal breath sounds. No wheezing, rhonchi or rales.  Chest:     Chest wall: No tenderness.  Abdominal:     General: Abdomen is flat.     Palpations: Abdomen is soft.     Tenderness: There is no abdominal tenderness. There is no right CVA tenderness, left CVA tenderness, guarding or rebound.  Musculoskeletal:        General: Signs of injury present. No swelling or tenderness.     Cervical back: Neck supple. No tenderness.     Right lower leg: No edema.     Left lower leg: No edema.  Skin:    General: Skin is warm and dry.     Capillary Refill: Capillary refill takes less than 2 seconds.     Findings: No erythema or rash.  Neurological:     General: No focal deficit present.     Mental Status: He is alert.     Sensory: No sensory deficit.     Motor: No weakness.  Psychiatric:        Mood and Affect: Mood normal.     ED Results / Procedures / Treatments   Labs (all labs ordered are listed, but only abnormal results are displayed) Labs Reviewed  CBC WITH DIFFERENTIAL/PLATELET - Abnormal; Notable for the following components:      Result Value   WBC 11.4 (*)    RBC 4.08 (*)    MCV 101.2 (*)    Neutro Abs 9.9 (*)    Lymphs Abs 0.6 (*)    All other components within normal limits  COMPREHENSIVE METABOLIC PANEL - Abnormal; Notable for the following components:   Glucose, Bld 144 (*)    Creatinine, Ser 1.55 (*)    Albumin 3.4 (*)    Total Bilirubin 1.4 (*)    GFR, Estimated 42 (*)    All other components within normal limits  BRAIN NATRIURETIC PEPTIDE - Abnormal; Notable for the following components:   B Natriuretic Peptide 768.9 (*)    All other components within normal limits  URINALYSIS, W/ REFLEX TO CULTURE (INFECTION SUSPECTED) - Abnormal; Notable for the following components:   Bacteria, UA RARE (*)    All other  components within normal limits  TROPONIN I (HIGH SENSITIVITY) - Abnormal; Notable for the following components:   Troponin I (High Sensitivity) 22 (*)    All other components within normal limits  TROPONIN I (HIGH SENSITIVITY) - Abnormal; Notable for the following components:   Troponin I (High Sensitivity) 21 (*)    All other components within normal limits  RESP PANEL BY RT-PCR (RSV, FLU A&B, COVID)  RVPGX2  TSH  MAGNESIUM    EKG None  Radiology CT Head Wo Contrast  Result Date: 12/17/2022 CLINICAL DATA:  Head trauma, minor (Age >= 65y); Neck trauma (Age >= 65y). EXAM: CT HEAD WITHOUT CONTRAST CT CERVICAL SPINE WITHOUT CONTRAST TECHNIQUE: Multidetector CT imaging of the head and cervical spine was performed following the standard protocol without intravenous contrast. Multiplanar CT image reconstructions of the cervical spine were also generated. RADIATION DOSE REDUCTION: This exam was performed according to the departmental dose-optimization program which includes automated exposure control, adjustment of the mA and/or kV according to patient size and/or use of iterative reconstruction technique. COMPARISON:  CT scan head from 03/19/2019. FINDINGS: CT HEAD FINDINGS Brain: No evidence of acute infarction, hemorrhage, hydrocephalus, extra-axial collection or mass lesion/mass effect. There is bilateral periventricular hypodensity, which is non-specific but most likely seen in the settings of microvascular ischemic changes. Mild in extent. Otherwise normal appearance of brain parenchyma. Cerebral volume loss with enlargement of the ventricles. Vascular: No hyperdense vessel or unexpected calcification. Intracranial arteriosclerosis. Skull: Normal. Negative for fracture or focal lesion. Sinuses/Orbits: No acute finding. Other: Visualized mastoid air cells are unremarkable. No mastoid effusion. CT CERVICAL SPINE FINDINGS Alignment: Normal. This examination does not assess for ligamentous injury or  stability. Skull base and vertebrae: No acute fracture. No primary bone lesion or focal pathologic process. Soft tissues and spinal canal: No prevertebral fluid or swelling. No visible canal hematoma. Disc levels: Moderate multilevel degenerative changes in the form of reduced intervertebral disc height, endplate sclerosis/irregularity, facet arthropathy and marginal osteophyte formation. Upper chest: Centrilobular emphysematous changes noted in the visualized lung apices. No mass or pleural effusion. Other: There is a hypoattenuating 2.1 x 2.8 cm nodule in the left thyroid lobe, incompletely characterized on the current examination. Nodule meets the criteria for follow-up nonemergent evaluation with thyroid ultrasound. Please refer to ultrasound thyroid report from February 03, 2016 for details. IMPRESSION: CT HEAD: 1. No acute intracranial abnormality. 2. Mild chronic microvascular ischemic changes and cerebral volume loss. CT CERVICAL SPINE: 1. No acute fracture or traumatic subluxation. 2. Moderate multilevel degenerative changes of the cervical spine. 3. A 2.8 cm hypoattenuating nodule in the left thyroid lobe, incompletely characterized on the current examination. Nodule meets the criteria for follow-up nonemergent evaluation with thyroid ultrasound. Emphysema (ICD10-J43.9). Electronically Signed   By: Jules Schick M.D.   On: 12/17/2022 16:39   CT Cervical Spine Wo Contrast  Result Date: 12/17/2022 CLINICAL DATA:  Head trauma, minor (Age >= 65y); Neck trauma (Age >= 65y). EXAM: CT HEAD WITHOUT CONTRAST CT CERVICAL SPINE WITHOUT CONTRAST TECHNIQUE: Multidetector CT imaging of the head and cervical spine was performed following the standard protocol without intravenous contrast. Multiplanar CT image reconstructions of the cervical spine were also generated. RADIATION DOSE REDUCTION: This exam was performed according to the departmental dose-optimization program which includes automated exposure control,  adjustment of the mA and/or kV according to patient size and/or use of iterative reconstruction technique. COMPARISON:  CT scan head from 03/19/2019. FINDINGS: CT HEAD FINDINGS Brain: No evidence of acute infarction, hemorrhage, hydrocephalus, extra-axial collection or mass lesion/mass effect. There is bilateral periventricular hypodensity, which is non-specific but most likely seen in the settings of microvascular ischemic changes. Mild in extent. Otherwise normal appearance of brain parenchyma. Cerebral volume loss with enlargement of the ventricles. Vascular: No hyperdense vessel or unexpected calcification. Intracranial arteriosclerosis. Skull: Normal. Negative for fracture or focal lesion. Sinuses/Orbits: No acute finding. Other: Visualized mastoid air cells are unremarkable. No mastoid effusion. CT CERVICAL SPINE FINDINGS Alignment: Normal. This examination does not assess for ligamentous injury or stability. Skull base and vertebrae: No acute  fracture. No primary bone lesion or focal pathologic process. Soft tissues and spinal canal: No prevertebral fluid or swelling. No visible canal hematoma. Disc levels: Moderate multilevel degenerative changes in the form of reduced intervertebral disc height, endplate sclerosis/irregularity, facet arthropathy and marginal osteophyte formation. Upper chest: Centrilobular emphysematous changes noted in the visualized lung apices. No mass or pleural effusion. Other: There is a hypoattenuating 2.1 x 2.8 cm nodule in the left thyroid lobe, incompletely characterized on the current examination. Nodule meets the criteria for follow-up nonemergent evaluation with thyroid ultrasound. Please refer to ultrasound thyroid report from February 03, 2016 for details. IMPRESSION: CT HEAD: 1. No acute intracranial abnormality. 2. Mild chronic microvascular ischemic changes and cerebral volume loss. CT CERVICAL SPINE: 1. No acute fracture or traumatic subluxation. 2. Moderate multilevel  degenerative changes of the cervical spine. 3. A 2.8 cm hypoattenuating nodule in the left thyroid lobe, incompletely characterized on the current examination. Nodule meets the criteria for follow-up nonemergent evaluation with thyroid ultrasound. Emphysema (ICD10-J43.9). Electronically Signed   By: Jules Schick M.D.   On: 12/17/2022 16:39   DG Hip Unilat W or Wo Pelvis 2-3 Views Left  Result Date: 12/17/2022 CLINICAL DATA:  near syncope for last few days, fall on eliquis. Left hip pain. EXAM: DG HIP (WITH OR WITHOUT PELVIS) 2-3V LEFT COMPARISON:  None Available. FINDINGS: Pelvis is intact with normal and symmetric sacroiliac joints. No acute fracture or dislocation. No aggressive osseous lesion. Visualized sacral arcuate lines are unremarkable. There are changes of chronic pubic symphisitis. There are mild degenerative changes of bilateral hip joints characterized by joint space narrowing and osteophytosis of the superior acetabulum. No radiopaque foreign bodies. IMPRESSION: 1. Mild degenerative change. No acute fracture or dislocation. Electronically Signed   By: Jules Schick M.D.   On: 12/17/2022 16:31   DG Chest Portable 1 View  Result Date: 12/17/2022 CLINICAL DATA:  near syncope for last few days, fall on eliquis. EXAM: PORTABLE CHEST 1 VIEW COMPARISON:  07/10/2022. FINDINGS: Increased interstitial markings with hilar predominance. Bilateral lung fields are otherwise clear. No acute consolidation or lung collapse. Bilateral costophrenic angles are clear. Stable moderately enlarged cardio-mediastinal silhouette. There are surgical staples along the heart border and sternotomy wires, status post CABG (coronary artery bypass graft). No acute osseous abnormalities. The soft tissues are within normal limits. IMPRESSION: 1. Mild pulmonary edema. 2. Stable cardiomegaly. Electronically Signed   By: Jules Schick M.D.   On: 12/17/2022 16:30    Procedures Procedures    Medications Ordered in  ED Medications  Tdap (BOOSTRIX) injection 0.5 mL (0.5 mLs Intramuscular Given 12/17/22 1827)    ED Course/ Medical Decision Making/ A&P                                 Medical Decision Making Amount and/or Complexity of Data Reviewed Labs: ordered. Radiology: ordered.  Risk Prescription drug management.    Cody Thomas is a 89 y.o. male with a past medical history significant for CAD, hypertension, hyperlipidemia, A-fib on Eliquis therapy, CKD, CHF, diabetes, insomnia, and depression who presents as a level 2 trauma for fall as well as recent near syncopal episodes and lightheadedness.  According to patient, for the last 2 days he has had episodes of lightheadedness and near syncope as well as some dizziness.  He said that he has taken his pulse with his finger and it is said it was very bradycardic with  reported rates in the 30s 40s and 50s when he was feeling lightheaded.  This morning he reports he was trying to get his pants on on the side of his bed when he felt lightheaded and then tumbled to the ground.  He is not sure if he hit his head but denies significant headache.  He is on Eliquis and the level 2 trauma was activated.  He is denying any other nausea, vomiting, vision changes and denies any other neurologic complaints at this time.  He reports minimal edema in his legs that is more chronic.  He reports chronic left hip pain that is not seen to be worse.  He denies any new back pain.  He denies any chest pain, palpitations, shortness of breath.  Reports some dry cough but no fevers or chills.  He does report recent URI exposure to a grandchild.  Denies any diarrhea but has chronic constipation.  Denies any urinary changes.  Reports she does not feel lightheaded now.  On exam, lungs clear.  Chest nontender.  Abdomen nontender.  Patient has skin tears to his left wrist and left elbow but has no significant tenderness.  No crepitance.  Intact sensation strength and pulses in  extremities.  No snuffbox tenderness or hand tenderness.  Back nontender.  Flanks nontender.  No evidence of acute head trauma.  Neck nontender.  Patient resting comfortably without acute distress.  Legs have very minimal edema.  Clinically I do feel need to rule out traumatic injuries given his level 2 trauma status.  Will get CT head and neck and will get chest x-ray and pelvis/hip x-ray.  His other extremities were not significantly tender and have low suspicion for bony injuries.  I am also concerned about this reported bradycardic episode and the several days of fatigue and her syncopal episodes.  Will get chest x-ray with his cough, COVID test given your exposure, and other lab workup given his cardiac history and this reported bradycardia.  Will update tetanus shot as appears out of date with his skin tears.  Anticipate reassessment after workup to determine disposition.   8:06 PM Workup returned surprisingly reassuring.  Troponin appears stable.  BNP improved from prior.  Urinalysis did not show infection.  COVID and flu negative.  Patient otherwise resting and passed a p.o. challenge.  Imaging overall reassuring and we discussed with family.  They agree with discharge home.  Will discharge for outpatient follow-up.  His heart rate has been in the 70s here and have not seen significant bradycardia.  He will call his cardiology team and PCP team for outpatient follow-up for the lightheadedness and fatigue.  They agreed with plan of care and patient discharged in good condition for outpatient follow-up        Final Clinical Impression(s) / ED Diagnoses Final diagnoses:  Fall, initial encounter  Intermittent lightheadedness    Rx / DC Orders ED Discharge Orders     None       Clinical Impression: 1. Fall, initial encounter   2. Intermittent lightheadedness     Disposition: Discharge  Condition: Good  I have discussed the results, Dx and Tx plan with the pt(& family if  present). He/she/they expressed understanding and agree(s) with the plan. Discharge instructions discussed at great length. Strict return precautions discussed and pt &/or family have verbalized understanding of the instructions. No further questions at time of discharge.    New Prescriptions   No medications on file    Follow Up:  Gweneth Dimitri, MD [address] [place] [address] [phone]     your cardiologist         Marabelle Cushman, Canary Brim, MD 12/17/22 2008

## 2022-12-17 NOTE — Discharge Instructions (Signed)
Your history, exam, workup today did not reveal significant traumatic injuries from the fall and your workup otherwise appeared similar or improved from prior.  We had a shared decision-making conversation and I agree with discharge home to call your primary doctor and your cardiologist to discuss further management and follow-up.  If any symptoms change or worsen acutely, please return to the nearest emergency department.

## 2022-12-17 NOTE — ED Triage Notes (Signed)
PT BIB EMS for a fall at 1000 am this morning, was dizzy and fell, unsure if he hit his head.  Pt takes eliquis.  Skin tear to left arm, left hip pain, and chronic left knee pain.   126/59 Afib 60-100 2L [place] 97% CBG 183 18 g L AC

## 2022-12-18 ENCOUNTER — Encounter (HOSPITAL_COMMUNITY): Payer: Self-pay

## 2022-12-20 NOTE — Plan of Care (Signed)
[facility] Tonsillectomy/Adenoidectomy, Postoperative PEDS care plan entered in error.

## 2022-12-26 ENCOUNTER — Other Ambulatory Visit: Payer: Self-pay | Admitting: Cardiology

## 2022-12-26 DIAGNOSIS — I480 Paroxysmal atrial fibrillation: Secondary | ICD-10-CM

## 2022-12-28 NOTE — Telephone Encounter (Signed)
Prescription refill request for Eliquis received. Indication:afib Last office visit:10/24 Scr:1.55  10/24 Age: 89 Weight:67.9  kg  Prescription refilled

## 2023-01-18 ENCOUNTER — Encounter: Payer: Self-pay | Admitting: Podiatry

## 2023-01-18 ENCOUNTER — Ambulatory Visit (INDEPENDENT_AMBULATORY_CARE_PROVIDER_SITE_OTHER): Payer: Medicare Other | Admitting: Podiatry

## 2023-01-18 DIAGNOSIS — M79675 Pain in left toe(s): Secondary | ICD-10-CM | POA: Diagnosis not present

## 2023-01-18 DIAGNOSIS — B351 Tinea unguium: Secondary | ICD-10-CM | POA: Diagnosis not present

## 2023-01-18 DIAGNOSIS — E1121 Type 2 diabetes mellitus with diabetic nephropathy: Secondary | ICD-10-CM

## 2023-01-18 DIAGNOSIS — M79674 Pain in right toe(s): Secondary | ICD-10-CM | POA: Diagnosis not present

## 2023-01-18 MED ORDER — MICONAZOLE NITRATE 2 % EX AERP: 1.0000 | INHALATION_SPRAY | Freq: Every day | CUTANEOUS | 0 refills | Status: AC

## 2023-01-18 NOTE — Progress Notes (Signed)
Subjective:  Patient ID: Cody Thomas, male    DOB: Sep 16, 1930,   MRN: [id number]  No chief complaint on file.   89 y.o. male presents for concern of thickened elongated and painful nails that are difficult to trim. Requesting to have them trimmed today. Relates burning and tingling in their feet. Patient is diabetic and last A1c was  Lab Results  Component Value Date   HGBA1C 6.2 08/10/2022   .   PCP:  Gweneth Dimitri, MD    . Denies any other pedal complaints. Denies n/v/f/c.   Past Medical History:  Diagnosis Date   Anxiety    Bunion 09/29/2011   Cancer (HCC)    nose/Dr. Albertini;skin   Chronic constipation    Chronic kidney disease    stage 111   Coronary atherosclerosis of native coronary artery    2009 LAD CIRC DES   Depression    doesn't take any meds for this   Diabetes mellitus    takes Januvia daily   Dizziness    Enlarged prostate    but doesn't require meds at present   GERD (gastroesophageal reflux disease)    TUMS prn   H/O hiatal hernia    Headache(784.0)    Heart failure with mildly reduced ejection fraction (HFmrEF) (HCC) 07/12/2022   - TTE 07/10/2022: EF 40-45, global HK, moderate LVH, moderately reduced RVSF, severe pulmonary hypertension, RVSP 63.4, severe BAE, moderate MR, moderate TR, trivial AI, AV sclerosis, RAP 15    - TTE 10/02/22: EF 45-50, global HK, mod LVH, severe BAE, mod MR, mod TR, mild AI, AV sclerosis, mod pulm HTN, RVSP 54.6, RAP 3      History of gout    doesn't require meds   Hx of cardiac cath    Hyperlipidemia    Crestor 3 x wk and Zetia daily   Hypertension    takes Hyzaar daily   Insomnia    doesn't require meds at present time   Joint pain    Onychomycosis 10/05/2012   x 10   PONV (postoperative nausea and vomiting)    pt states extremely sick   Shortness of breath    with exertion    Objective:  Physical Exam: Vascular: DP/PT pulses 2/4 bilateral. CFT <3 seconds. Absent hair growth on digits. Edema noted to  bilateral lower extremities. Xerosis noted bilaterally.  Skin. No lacerations or abrasions bilateral feet. Nails 1-5 bilateral  are thickened discolored and elongated with subungual debris.  Musculoskeletal: MMT 5/5 bilateral lower extremities in DF, PF, Inversion and Eversion. Deceased ROM in DF of ankle joint.  Neurological: Sensation intact to light touch. Protective sensation diminished bilateral.     Assessment:   1. Pain due to onychomycosis of toenails of both feet   2. Diabetic nephropathy associated with type 2 diabetes mellitus (HCC)      Plan:  Patient was evaluated and treated and all questions answered. -Discussed and educated patient on diabetic foot care, especially with  regards to the vascular, neurological and musculoskeletal systems.  -Stressed the importance of good glycemic control and the detriment of not  controlling glucose levels in relation to the foot. -Discussed supportive shoes at all times and checking feet regularly.  -Mechanically debrided all nails 1-5 bilateral using sterile nail nipper and filed with dremel without incident  -Answered all patient questions -Patient to return  in 3 months for at risk foot care -Patient advised to call the office if any problems or questions arise in the meantime.  Louann Sjogren, DPM

## 2023-03-28 ENCOUNTER — Other Ambulatory Visit (HOSPITAL_COMMUNITY): Payer: Self-pay | Admitting: Cardiology

## 2023-04-03 ENCOUNTER — Other Ambulatory Visit: Payer: Self-pay | Admitting: Cardiology

## 2023-04-27 ENCOUNTER — Ambulatory Visit (INDEPENDENT_AMBULATORY_CARE_PROVIDER_SITE_OTHER): Payer: Medicare Other | Admitting: Podiatry

## 2023-04-27 ENCOUNTER — Encounter: Payer: Self-pay | Admitting: Podiatry

## 2023-04-27 VITALS — Ht 63.0 in | Wt 149.6 lb

## 2023-04-27 DIAGNOSIS — E1121 Type 2 diabetes mellitus with diabetic nephropathy: Secondary | ICD-10-CM

## 2023-04-27 DIAGNOSIS — M79675 Pain in left toe(s): Secondary | ICD-10-CM | POA: Diagnosis not present

## 2023-04-27 DIAGNOSIS — M79674 Pain in right toe(s): Secondary | ICD-10-CM | POA: Diagnosis not present

## 2023-04-27 DIAGNOSIS — B351 Tinea unguium: Secondary | ICD-10-CM | POA: Diagnosis not present

## 2023-04-30 NOTE — Progress Notes (Signed)
  Subjective:  Patient ID: Cody Thomas, male    DOB: November 03, 1930,  MRN: [id number]  89 y.o. male presents with at risk foot care. Pt has h/o NIDDM with chronic kidney disease and painful mycotic toenails x 10 which interfere with daily activities. Pain is relieved with periodic professional debridement. Chief Complaint  Patient presents with   Nail Problem    Pt is here for [facility] unsure of last A1C PCP is Dr Uvaldo Rising and LOV was in yesterday.     PCP: Gweneth Dimitri, MD.  New problem(s): None.   Review of Systems: Negative except as noted in the HPI.   Allergies  Allergen Reactions   Crestor [Rosuvastatin] Other (See Comments)    Nightmares    Sertraline Diarrhea   Zocor [Simvastatin] Other (See Comments)    Nightmares     Objective:  There were no vitals filed for this visit. Constitutional Patient is a pleasant 89 y.o. male in NAD. AAO x 3.  Vascular Capillary fill time to digits <3 seconds.  DP/PT pulse(s) are faintly palpable b/l lower extremities. Pedal hair absent b/l. Lower extremity skin temperature gradient warm to cool b/l. No pain with calf compression b/l. No cyanosis or clubbing noted. No ischemia nor gangrene noted b/l.   Neurologic Protective sensation intact 5/5 intact bilaterally with 10g monofilament b/l. Vibratory sensation intact b/l. No clonus b/l.   Dermatologic Pedal skin is thin, shiny and atrophic b/l.  No open wounds b/l lower extremities. No interdigital macerations b/l lower extremities. Toenails 1-5 b/l elongated, discolored, dystrophic, thickened, crumbly with subungual debris and tenderness to dorsal palpation. No corns, calluses nor porokeratotic lesions noted.  Orthopedic: Normal muscle strength 5/5 to all lower extremity muscle groups bilaterally. HAV with bunion deformity noted b/l LE.   Last HgA1c:     Latest Ref Rng & Units 08/10/2022   10:45 AM  Hemoglobin A1C  Hemoglobin-A1c 4.6 - 6.5 % 6.2      Assessment:   1. Pain due to  onychomycosis of toenails of both feet   2. Diabetic nephropathy associated with type 2 diabetes mellitus (HCC)    Plan:  Patient was evaluated and treated. All patient's and/or POA's questions/concerns addressed on today's visit. Mycotic toenails 1-5 debrided in length and girth without incident.  Continue daily foot inspections and monitor blood glucose per PCP/Endocrinologist's recommendations.Continue soft, supportive shoe gear daily. Report any pedal injuries to medical professional. Call office if there are any quesitons/concerns. -Patient/POA to call should there be question/concern in the interim.  Return in about 3 months (around 07/25/2023).  Freddie Breech, DPM      [facility] LOCATION: [address], [place] [address]                   Office [phone]   [facility] LOCATION: [address] [place], [place] [address] Office [phone]

## 2023-06-01 ENCOUNTER — Other Ambulatory Visit: Payer: Self-pay | Admitting: Physician Assistant

## 2023-06-08 ENCOUNTER — Encounter: Payer: Self-pay | Admitting: Physician Assistant

## 2023-06-08 ENCOUNTER — Ambulatory Visit: Payer: Medicare Other | Attending: Physician Assistant | Admitting: Physician Assistant

## 2023-06-08 VITALS — BP 132/68 | HR 63 | Ht 63.0 in | Wt 150.8 lb

## 2023-06-08 DIAGNOSIS — I5022 Chronic systolic (congestive) heart failure: Secondary | ICD-10-CM | POA: Diagnosis not present

## 2023-06-08 DIAGNOSIS — I2510 Atherosclerotic heart disease of native coronary artery without angina pectoris: Secondary | ICD-10-CM

## 2023-06-08 DIAGNOSIS — I4821 Permanent atrial fibrillation: Secondary | ICD-10-CM

## 2023-06-08 DIAGNOSIS — N1832 Chronic kidney disease, stage 3b: Secondary | ICD-10-CM | POA: Diagnosis not present

## 2023-06-08 DIAGNOSIS — I5020 Unspecified systolic (congestive) heart failure: Secondary | ICD-10-CM

## 2023-06-08 DIAGNOSIS — I340 Nonrheumatic mitral (valve) insufficiency: Secondary | ICD-10-CM

## 2023-06-08 NOTE — Assessment & Plan Note (Signed)
 Rate remains controlled.  He is appropriate for dose adjustment of Eliquis based upon age and renal function. He has had post termination pauses in the past. He has not had symptoms of syncope, near syncope.  - Continue Eliquis 2.5 mg twice daily - Continue Metoprolol tartrate 25 mg twice daily

## 2023-06-08 NOTE — Assessment & Plan Note (Signed)
 Stable renal function with creatinine 1.73 mg/dL (February 4098).

## 2023-06-08 NOTE — Progress Notes (Signed)
 Cardiology Office Note:    Date:  06/08/2023  ID:  Cody Thomas, DOB 06-May-1930, MRN [id number] PCP: Gweneth Dimitri, MD  [facility] Providers Cardiologist:  Donato Schultz, MD       Patient Profile:      Coronary artery disease  History of PCI in 2009 S/p CABG 10/2011 Permanent Atrial fibrillation  Monitor 05/2019: Atrial flutter with variable conduction, multiple episodes NSVT (longest 9 seconds); postconversion pauses up to 3 seconds Seen by EP (Dr. Graciela Husbands) - watchful waiting vs ILR >> Pt opted for [facility] HFmrEF (heart failure with mildly reduced ejection fraction)  TTE 03/2019: EF 60-65 TTE 07/10/2022: EF 40-45, global HK, moderate LVH, moderately reduced RVSF, severe pulmonary hypertension, RVSP 63.4, severe BAE, moderate MR, moderate TR, trivial AI, AV sclerosis, RAP 15 Limited TTE 10/02/2022: EF 45-50, global HK, moderate LVH, severe BAE, moderate MR, moderate TR, mild AI, AV sclerosis, moderate pulmonary hypertension, RVSP 54.6 Mitral regurgitation  Pulmonary hypertension  Chronic kidney disease  Hypertension  Diabetes mellitus   Carotid stenosis Carotid US 03/20/2019: Bilateral ICA 1-39 OSA  Bradycardia  No beta-blocker; hx of pauses Former smoker         Discussed the use of AI scribe software for clinical note transcription with the patient, who gave verbal consent to proceed.  History of Present Illness Cody Thomas is a 89 y.o. male who returns for follow-up of CAD, A-fib, CHF.  He was last seen in clinic by Dr. Anne Fu 12/10/2022.  He experiences occasional chest pains lasting about a minute, occurring without any specific activity. No chest heaviness or tightness with exertion is reported. He experiences occasional shortness of breath but can perform daily activities without significant limitation and can lay flat in bed without difficulty. No significant leg swelling is noted, though his L leg is always larger.    Review of Systems  Gastrointestinal:   Negative for hematochezia and melena.  Genitourinary:  Negative for hematuria.  -See HPI    Studies Reviewed:       Results Labs-chart review 12/17/2022: K 3.7, creatinine 1.55, ALT 14, Hgb 13.8, TSH 1.145  LABS - Reviewed from primary care  Creatinine: 1.73 (04/26/2023) Potassium: 4.1 (04/26/2023) ALT: 9 (04/26/2023)   Risk Assessment/Calculations:    CHA2DS2-VASc Score = 6   This indicates a 9.7% annual risk of stroke. The patient's score is based upon: CHF History: 1 HTN History: 1 Diabetes History: 1 Stroke History: 0 Vascular Disease History: 1 Age Score: 2 Gender Score: 0            Physical Exam:   VS:  BP 132/68   Pulse 63   Ht 5\' 3"  (1.6 m)   Wt 150 lb 12.8 oz (68.4 kg)   SpO2 92%   BMI 26.71 kg/m    Wt Readings from Last 3 Encounters:  06/08/23 150 lb 12.8 oz (68.4 kg)  04/27/23 149 lb 9.6 oz (67.9 kg)  12/17/22 149 lb 9.6 oz (67.9 kg)    Constitutional:      Appearance: Not in distress.  Neck:     Vascular: No JVR.  Pulmonary:     Breath sounds: No wheezing. No rales.  Cardiovascular:     Normal rate. Irregularly irregular rhythm.     Murmurs: There is a grade 2/6 systolic murmur at the LLSB.  Edema:    Pretibial: trace edema of the right pretibial area.     Assessment and Plan:   Assessment & Plan Heart failure with  mildly reduced ejection fraction (HFmrEF) (HCC) Ejection fraction improved to 45-50% (August 2024). Moderate mitral regurgitation and pulmonary hypertension (RVSP 54.6 mmHg).  Volume status stable. - Continue Lasix 40 mg in the morning and 20 mg in the afternoon - Continue Hydralazine 25 mg twice daily - Continue Imdur 30 mg daily - Continue Metoprolol tartrate 25 mg twice daily - Continue K+ 20 mEq daily Permanent atrial fibrillation (HCC) Rate remains controlled.  He is appropriate for dose adjustment of Eliquis based upon age and renal function. He has had post termination pauses in the past. He has not had symptoms of  syncope, near syncope.  - Continue Eliquis 2.5 mg twice daily - Continue Metoprolol tartrate 25 mg twice daily CAD in native artery S/p PCI (2009) and CABG (August 2013). Currently asymptomatic w/o chest pain to suggest angina.  He has occasional non-exertional left chest pain lasting about a minute that seems to be noncardiac.  He is not on aspirin as he is on anticoagulation with apixaban. - Continue Metoprolol tartrate 25 mg twice daily - Continue Imdur 30 mg daily Stage 3b chronic kidney disease (HCC) Stable renal function with creatinine 1.73 mg/dL (February 1610).  Nonrheumatic mitral valve regurgitation Moderate mitral regurgitation by echocardiogram August 2024.  Continue clinical follow-up to determine if repeat echocardiogram needed.      Dispo:  Return in about 6 months (around 12/08/2023) for Routine Follow Up, w/ Dr. Anne Fu.  Signed, Tereso Newcomer, PA-C

## 2023-06-08 NOTE — Assessment & Plan Note (Signed)
 Ejection fraction improved to 45-50% (August 2024). Moderate mitral regurgitation and pulmonary hypertension (RVSP 54.6 mmHg).  Volume status stable. - Continue Lasix 40 mg in the morning and 20 mg in the afternoon - Continue Hydralazine 25 mg twice daily - Continue Imdur 30 mg daily - Continue Metoprolol tartrate 25 mg twice daily - Continue K+ 20 mEq daily

## 2023-06-08 NOTE — Patient Instructions (Signed)
 Medication Instructions:  Your physician recommends that you continue on your current medications as directed. Please refer to the Current Medication list given to you today.  *If you need a refill on your cardiac medications before your next appointment, please call your pharmacy*  Lab Work: None ordered  If you have labs (blood work) drawn today and your tests are completely normal, you will receive your results only by: MyChart Message (if you have MyChart) OR A paper copy in the mail If you have any lab test that is abnormal or we need to change your treatment, we will call you to review the results.  Testing/Procedures: None ordered  Follow-Up: At [facility], you and your health needs are our priority.  As part of our continuing mission to provide you with exceptional heart care, our providers are all part of one team.  This team includes your primary Cardiologist (physician) and Advanced Practice Providers or APPs (Physician Assistants and Nurse Practitioners) who all work together to provide you with the care you need, when you need it.  Your next appointment:   6 month(s)  Provider:   Donato Schultz, MD     We recommend signing up for the patient portal called "MyChart".  Sign up information is provided on this After Visit Summary.  MyChart is used to connect with patients for Virtual Visits (Telemedicine).  Patients are able to view lab/test results, encounter notes, upcoming appointments, etc.  Non-urgent messages can be sent to your provider as well.   To learn more about what you can do with MyChart, go to [email or web address].   Other Instructions       1st Floor: - Lobby - Registration  - Pharmacy  - Lab - Cafe  2nd Floor: - PV Lab - Diagnostic Testing (echo, CT, nuclear med)  3rd Floor: - Vacant  4th Floor: - TCTS (cardiothoracic surgery) - AFib Clinic - Structural Heart Clinic - Vascular Surgery  - Vascular Ultrasound  5th Floor: -  HeartCare Cardiology (general and EP) - Clinical Pharmacy for coumadin, hypertension, lipid, weight-loss medications, and med management appointments    Valet parking services will be available as well.

## 2023-07-22 ENCOUNTER — Ambulatory Visit (INDEPENDENT_AMBULATORY_CARE_PROVIDER_SITE_OTHER): Payer: Medicare Other | Admitting: Podiatry

## 2023-07-22 ENCOUNTER — Encounter: Payer: Self-pay | Admitting: Podiatry

## 2023-07-22 DIAGNOSIS — M79675 Pain in left toe(s): Secondary | ICD-10-CM

## 2023-07-22 DIAGNOSIS — E1121 Type 2 diabetes mellitus with diabetic nephropathy: Secondary | ICD-10-CM

## 2023-07-22 DIAGNOSIS — M79674 Pain in right toe(s): Secondary | ICD-10-CM

## 2023-07-22 DIAGNOSIS — B351 Tinea unguium: Secondary | ICD-10-CM

## 2023-07-22 NOTE — Progress Notes (Signed)
This patient returns to my office for at risk foot care.  This patient requires this care by a professional since this patient will be at risk due to having diabetes.  This patient is unable to cut nails himself since the patient cannot reach his nails.These nails are painful walking and wearing shoes.  This patient presents for at risk foot care today.  General Appearance  Alert, conversant and in no acute stress.  Vascular  Dorsalis pedis and posterior tibial  pulses are weakly  palpable  bilaterally.  Capillary return is within normal limits  bilaterally. Temperature is within normal limits  bilaterally.  Neurologic  Senn-Weinstein monofilament wire test within normal limits  bilaterally. Muscle power within normal limits bilaterally.  Nails Thick disfigured discolored nails with subungual debris  from hallux to fifth toes bilaterally. No evidence of bacterial infection or drainage bilaterally.  Orthopedic  No limitations of motion  feet .  No crepitus or effusions noted.  No bony pathology or digital deformities noted. HAV  B/L.  Skin  normotropic skin with no porokeratosis noted bilaterally.  No signs of infections or ulcers noted.     Onychomycosis  Pain in right toes  Pain in left toes  Consent was obtained for treatment procedures.   Mechanical debridement of nails 1-5  bilaterally performed with a nail nipper.  Filed with dremel without incident.    Return office visit  3 months                   Told patient to return for periodic foot care and evaluation due to potential at risk complications.   Ercelle Winkles DPM  

## 2023-08-07 ENCOUNTER — Other Ambulatory Visit (HOSPITAL_COMMUNITY): Payer: Self-pay | Admitting: Cardiology

## 2023-08-10 ENCOUNTER — Telehealth: Payer: Self-pay | Admitting: Cardiology

## 2023-08-10 DIAGNOSIS — I480 Paroxysmal atrial fibrillation: Secondary | ICD-10-CM

## 2023-08-10 MED ORDER — APIXABAN 2.5 MG PO TABS: 2.5000 mg | ORAL_TABLET | Freq: Two times a day (BID) | ORAL | 1 refills | Status: DC

## 2023-08-10 NOTE — Telephone Encounter (Signed)
 Prescription refill request for Eliquis  received. Indication: Afib  Last office visit: 06/08/23 Reyne Cave)  Scr: 1.55 (12/17/22)  Age: 89 Weight: 68.4kg  Appropriate dose. Refill sent.

## 2023-08-10 NOTE — Telephone Encounter (Signed)
*  STAT* If patient is at the pharmacy, call can be transferred to refill team.   1. Which medications need to be refilled? (please list name of each medication and dose if known) ELIQUIS  2.5 MG TABS tablet   2. Which pharmacy/location (including street and city if local pharmacy) is medication to be sent to? [id number] - [place], [place] - [address] [phone]   3. Do they need a 30 day or 90 day supply? 90

## 2023-08-25 ENCOUNTER — Other Ambulatory Visit (HOSPITAL_COMMUNITY): Payer: Self-pay | Admitting: Cardiology

## 2023-10-26 ENCOUNTER — Emergency Department (HOSPITAL_COMMUNITY)

## 2023-10-26 ENCOUNTER — Ambulatory Visit (INDEPENDENT_AMBULATORY_CARE_PROVIDER_SITE_OTHER): Payer: Medicare Other | Admitting: Podiatry

## 2023-10-26 ENCOUNTER — Encounter (HOSPITAL_COMMUNITY): Payer: Self-pay | Admitting: Emergency Medicine

## 2023-10-26 ENCOUNTER — Inpatient Hospital Stay (HOSPITAL_COMMUNITY)
Admission: EM | Admit: 2023-10-26 | Discharge: 2023-10-29 | DRG: 291 | Disposition: A | Discharge status: Home / Self Care | Source: Ambulatory Visit | Attending: Internal Medicine | Admitting: Internal Medicine

## 2023-10-26 ENCOUNTER — Other Ambulatory Visit: Payer: Self-pay

## 2023-10-26 DIAGNOSIS — I130 Hypertensive heart and chronic kidney disease with heart failure and stage 1 through stage 4 chronic kidney disease, or unspecified chronic kidney disease: Secondary | ICD-10-CM | POA: Diagnosis present

## 2023-10-26 DIAGNOSIS — G4733 Obstructive sleep apnea (adult) (pediatric): Secondary | ICD-10-CM | POA: Diagnosis present

## 2023-10-26 DIAGNOSIS — J9621 Acute and chronic respiratory failure with hypoxia: Secondary | ICD-10-CM | POA: Diagnosis present

## 2023-10-26 DIAGNOSIS — E119 Type 2 diabetes mellitus without complications: Secondary | ICD-10-CM

## 2023-10-26 DIAGNOSIS — R6510 Systemic inflammatory response syndrome (SIRS) of non-infectious origin without acute organ dysfunction: Secondary | ICD-10-CM | POA: Diagnosis present

## 2023-10-26 DIAGNOSIS — I2489 Other forms of acute ischemic heart disease: Secondary | ICD-10-CM | POA: Diagnosis present

## 2023-10-26 DIAGNOSIS — E871 Hypo-osmolality and hyponatremia: Secondary | ICD-10-CM | POA: Diagnosis present

## 2023-10-26 DIAGNOSIS — I5023 Acute on chronic systolic (congestive) heart failure: Secondary | ICD-10-CM | POA: Diagnosis not present

## 2023-10-26 DIAGNOSIS — Z8551 Personal history of malignant neoplasm of bladder: Secondary | ICD-10-CM

## 2023-10-26 DIAGNOSIS — J40 Bronchitis, not specified as acute or chronic: Secondary | ICD-10-CM | POA: Diagnosis present

## 2023-10-26 DIAGNOSIS — R0602 Shortness of breath: Secondary | ICD-10-CM | POA: Diagnosis present

## 2023-10-26 DIAGNOSIS — Z9981 Dependence on supplemental oxygen: Secondary | ICD-10-CM | POA: Diagnosis not present

## 2023-10-26 DIAGNOSIS — I4821 Permanent atrial fibrillation: Secondary | ICD-10-CM | POA: Diagnosis present

## 2023-10-26 DIAGNOSIS — Z9049 Acquired absence of other specified parts of digestive tract: Secondary | ICD-10-CM

## 2023-10-26 DIAGNOSIS — Z1152 Encounter for screening for COVID-19: Secondary | ICD-10-CM

## 2023-10-26 DIAGNOSIS — N1832 Chronic kidney disease, stage 3b: Secondary | ICD-10-CM | POA: Diagnosis present

## 2023-10-26 DIAGNOSIS — I480 Paroxysmal atrial fibrillation: Secondary | ICD-10-CM | POA: Diagnosis present

## 2023-10-26 DIAGNOSIS — F411 Generalized anxiety disorder: Secondary | ICD-10-CM | POA: Diagnosis present

## 2023-10-26 DIAGNOSIS — Z823 Family history of stroke: Secondary | ICD-10-CM

## 2023-10-26 DIAGNOSIS — R7989 Other specified abnormal findings of blood chemistry: Secondary | ICD-10-CM | POA: Diagnosis present

## 2023-10-26 DIAGNOSIS — Z91198 Patient's noncompliance with other medical treatment and regimen for other reason: Secondary | ICD-10-CM

## 2023-10-26 DIAGNOSIS — E876 Hypokalemia: Secondary | ICD-10-CM | POA: Diagnosis present

## 2023-10-26 DIAGNOSIS — D696 Thrombocytopenia, unspecified: Secondary | ICD-10-CM | POA: Diagnosis present

## 2023-10-26 DIAGNOSIS — J810 Acute pulmonary edema: Secondary | ICD-10-CM

## 2023-10-26 DIAGNOSIS — N400 Enlarged prostate without lower urinary tract symptoms: Secondary | ICD-10-CM | POA: Diagnosis present

## 2023-10-26 DIAGNOSIS — Z79899 Other long term (current) drug therapy: Secondary | ICD-10-CM

## 2023-10-26 DIAGNOSIS — I5043 Acute on chronic combined systolic (congestive) and diastolic (congestive) heart failure: Secondary | ICD-10-CM | POA: Diagnosis present

## 2023-10-26 DIAGNOSIS — Z7901 Long term (current) use of anticoagulants: Secondary | ICD-10-CM

## 2023-10-26 DIAGNOSIS — I2510 Atherosclerotic heart disease of native coronary artery without angina pectoris: Secondary | ICD-10-CM | POA: Diagnosis present

## 2023-10-26 DIAGNOSIS — I509 Heart failure, unspecified: Principal | ICD-10-CM

## 2023-10-26 DIAGNOSIS — E1122 Type 2 diabetes mellitus with diabetic chronic kidney disease: Secondary | ICD-10-CM | POA: Diagnosis present

## 2023-10-26 DIAGNOSIS — Z87442 Personal history of urinary calculi: Secondary | ICD-10-CM

## 2023-10-26 DIAGNOSIS — Z87891 Personal history of nicotine dependence: Secondary | ICD-10-CM

## 2023-10-26 DIAGNOSIS — F32A Depression, unspecified: Secondary | ICD-10-CM | POA: Diagnosis present

## 2023-10-26 DIAGNOSIS — R112 Nausea with vomiting, unspecified: Secondary | ICD-10-CM | POA: Diagnosis present

## 2023-10-26 DIAGNOSIS — Z951 Presence of aortocoronary bypass graft: Secondary | ICD-10-CM

## 2023-10-26 DIAGNOSIS — Z66 Do not resuscitate: Secondary | ICD-10-CM | POA: Diagnosis present

## 2023-10-26 DIAGNOSIS — N179 Acute kidney failure, unspecified: Secondary | ICD-10-CM | POA: Diagnosis present

## 2023-10-26 DIAGNOSIS — Z85828 Personal history of other malignant neoplasm of skin: Secondary | ICD-10-CM

## 2023-10-26 DIAGNOSIS — N183 Chronic kidney disease, stage 3 unspecified: Secondary | ICD-10-CM | POA: Diagnosis present

## 2023-10-26 DIAGNOSIS — Z8249 Family history of ischemic heart disease and other diseases of the circulatory system: Secondary | ICD-10-CM

## 2023-10-26 DIAGNOSIS — E785 Hyperlipidemia, unspecified: Secondary | ICD-10-CM | POA: Diagnosis present

## 2023-10-26 DIAGNOSIS — Z888 Allergy status to other drugs, medicaments and biological substances status: Secondary | ICD-10-CM

## 2023-10-26 DIAGNOSIS — F339 Major depressive disorder, recurrent, unspecified: Secondary | ICD-10-CM | POA: Diagnosis present

## 2023-10-26 DIAGNOSIS — R197 Diarrhea, unspecified: Secondary | ICD-10-CM | POA: Diagnosis present

## 2023-10-26 DIAGNOSIS — Z955 Presence of coronary angioplasty implant and graft: Secondary | ICD-10-CM

## 2023-10-26 LAB — COMPREHENSIVE METABOLIC PANEL WITH GFR
ALT: 20 U/L (ref 0–44)
AST: 41 U/L (ref 15–41)
Albumin: 3.6 g/dL (ref 3.5–5.0)
Alkaline Phosphatase: 53 U/L (ref 38–126)
Anion gap: 14 (ref 5–15)
BUN: 28 mg/dL — ABNORMAL HIGH (ref 8–23)
CO2: 24 mmol/L (ref 22–32)
Calcium: 8.8 mg/dL — ABNORMAL LOW (ref 8.9–10.3)
Chloride: 90 mmol/L — ABNORMAL LOW (ref 98–111)
Creatinine, Ser: 1.91 mg/dL — ABNORMAL HIGH (ref 0.61–1.24)
GFR, Estimated: 33 mL/min — ABNORMAL LOW (ref >60)
Glucose, Bld: 120 mg/dL — ABNORMAL HIGH (ref 70–99)
Potassium: 3.3 mmol/L — ABNORMAL LOW (ref 3.5–5.1)
Sodium: 128 mmol/L — ABNORMAL LOW (ref 135–145)
Total Bilirubin: 2.1 mg/dL — ABNORMAL HIGH (ref 0.0–1.2)
Total Protein: 6.9 g/dL (ref 6.5–8.1)

## 2023-10-26 LAB — CBC WITH DIFFERENTIAL/PLATELET
Abs Immature Granulocytes: 0.05 K/uL (ref 0.00–0.07)
Basophils Absolute: 0 K/uL (ref 0.0–0.1)
Basophils Relative: 0 %
Eosinophils Absolute: 0 K/uL (ref 0.0–0.5)
Eosinophils Relative: 0 %
HCT: 40.7 % (ref 39.0–52.0)
Hemoglobin: 13.8 g/dL (ref 13.0–17.0)
Immature Granulocytes: 0 %
Lymphocytes Relative: 3 %
Lymphs Abs: 0.3 K/uL — ABNORMAL LOW (ref 0.7–4.0)
MCH: 33.9 pg (ref 26.0–34.0)
MCHC: 33.9 g/dL (ref 30.0–36.0)
MCV: 100 fL (ref 80.0–100.0)
Monocytes Absolute: 1.1 K/uL — ABNORMAL HIGH (ref 0.1–1.0)
Monocytes Relative: 9 %
Neutro Abs: 10.6 K/uL — ABNORMAL HIGH (ref 1.7–7.7)
Neutrophils Relative %: 88 %
Platelets: 101 K/uL — ABNORMAL LOW (ref 150–400)
RBC: 4.07 MIL/uL — ABNORMAL LOW (ref 4.22–5.81)
RDW: 13.9 % (ref 11.5–15.5)
WBC: 12.1 K/uL — ABNORMAL HIGH (ref 4.0–10.5)
nRBC: 0 % (ref 0.0–0.2)

## 2023-10-26 LAB — I-STAT CG4 LACTIC ACID, ED
Lactic Acid, Venous: 2.9 mmol/L (ref 0.5–1.9)
Lactic Acid, Venous: 1.2 mmol/L (ref 0.5–1.9)

## 2023-10-26 LAB — RESP PANEL BY RT-PCR (RSV, FLU A&B, COVID)  RVPGX2
Influenza A by PCR: NEGATIVE
Influenza B by PCR: NEGATIVE
Resp Syncytial Virus by PCR: NEGATIVE
SARS Coronavirus 2 by RT PCR: NEGATIVE

## 2023-10-26 LAB — TROPONIN I (HIGH SENSITIVITY): Troponin I (High Sensitivity): 145 ng/L (ref <18)

## 2023-10-26 LAB — BRAIN NATRIURETIC PEPTIDE: B Natriuretic Peptide: 2349.2 pg/mL — ABNORMAL HIGH (ref 0.0–100.0)

## 2023-10-26 MED ORDER — SODIUM CHLORIDE 0.9 % IV SOLN
1.0000 g | Freq: Once | INTRAVENOUS | Status: AC
  Administered 2023-10-26: 1 g via INTRAVENOUS
  Filled 2023-10-26: qty 10

## 2023-10-26 MED ORDER — ACETAMINOPHEN 325 MG PO TABS
650.0000 mg | ORAL_TABLET | Freq: Once | ORAL | Status: AC
  Administered 2023-10-26: 650 mg via ORAL
  Filled 2023-10-26: qty 2

## 2023-10-26 MED ORDER — FUROSEMIDE 10 MG/ML IJ SOLN
40.0000 mg | Freq: Once | INTRAMUSCULAR | Status: AC
  Administered 2023-10-26: 40 mg via INTRAVENOUS
  Filled 2023-10-26: qty 4

## 2023-10-26 MED ORDER — SODIUM CHLORIDE 0.9 % IV SOLN
500.0000 mg | Freq: Once | INTRAVENOUS | Status: AC
  Administered 2023-10-26: 500 mg via INTRAVENOUS
  Filled 2023-10-26: qty 5

## 2023-10-26 NOTE — ED Triage Notes (Signed)
 Pt is been sent to ER by pcp from his office for low SPO2 on 2L [place] 80% and increase level of BNP >2000. During triage he is only 84% on his home O2 at 2L. Pt denies any pain at this time.

## 2023-10-26 NOTE — ED Triage Notes (Signed)
 The patient has been brought in by his granddaughter who reports his PCP sent him here to the ER for hypoxia and anemia. He is on 2L [place] at baseline. Sats were 72% per grand daughter. Denies pain but he does have shortness of breath.

## 2023-10-26 NOTE — ED Notes (Signed)
 CRITICAL VALUE STICKER  CRITICAL VALUE: Troponin 145  RECEIVER (on-site recipient of call): Tawni Pepper  DATE & TIME NOTIFIED:  9:53 PM 10/26/23   MESSENGER (representative from lab): MARLA Bertrand  MD NOTIFIED: JINNY Moulder  TIME OF NOTIFICATION: 9:53 PM  RESPONSE:  Awaiting orders

## 2023-10-26 NOTE — Progress Notes (Signed)
 1. Failure to attend appointment with reason given    Appointment canceled by patient.

## 2023-10-26 NOTE — ED Provider Notes (Incomplete)
 [place] EMERGENCY DEPARTMENT AT [facility] Provider Note   CSN: [id number] Arrival date & time: 10/26/23  8070     Patient presents with: Shortness of Breath   Cody Thomas is a 89 y.o. male.  {Add pertinent medical, surgical, social history, OB history to HPI:32947}  Shortness of Breath      Prior to Admission medications   Medication Sig Start Date End Date Taking? Authorizing Provider  acetaminophen  (TYLENOL ) 650 MG CR tablet Take 650 mg by mouth every 8 (eight) hours as needed for pain.    [provider]  apixaban  (ELIQUIS ) 2.5 MG TABS tablet Take 1 tablet (2.5 mg total) by mouth 2 (two) times daily. 08/10/23   Jeffrie Oneil BROCKS, MD  escitalopram  (LEXAPRO ) 5 MG tablet Take 2.5 mg by mouth every evening. 04/17/21   [provider]  furosemide  (LASIX ) 40 MG tablet TAKE 1 TABLET (40 MG TOTAL) BY MOUTH EVERY MORNING AND 0.5 TABLETS (20 MG TOTAL) EVERY EVENING. 03/30/23   Jeffrie Oneil BROCKS, MD  Glucose Blood (BLOOD GLUCOSE TEST STRIPS 333) STRP 1 strip by In Vitro route 3 (three) times daily. 10/09/22   [place], MD  hydrALAZINE  (APRESOLINE ) 25 MG tablet TAKE 1 TABLET BY MOUTH THREE TIMES A DAY 08/30/23   Lelon Hamilton T, PA-C  isosorbide  mononitrate (IMDUR ) 30 MG 24 hr tablet TAKE 1 TABLET BY MOUTH EVERY DAY 06/01/23   Jeffrie Oneil BROCKS, MD  metoprolol  tartrate (LOPRESSOR ) 25 MG tablet TAKE 1 TABLET BY MOUTH TWICE A DAY 04/05/23   Patwardhan, Manish J, MD  potassium chloride  SA (KLOR-CON  M20) 20 MEQ tablet Take 1 tablet (20 mEq total) by mouth daily. 12/10/22   Jeffrie Oneil BROCKS, MD  traZODone  (DESYREL ) 50 MG tablet Take 50 mg by mouth at bedtime. 08/22/20   [provider]    Allergies: Crestor [rosuvastatin], Sertraline, and Zocor [simvastatin]    Review of Systems  Respiratory:  Positive for shortness of breath.     Updated Vital Signs BP 115/80 (BP Location: Right Arm)   Pulse 85   Temp (!) 101.4 F (38.6 C) (Oral)   Resp (!) 25   Ht 5' 3  (1.6 m)   Wt 69 kg   SpO2 (!) 83%   BMI 26.95 kg/m   Physical Exam  (all labs ordered are listed, but only abnormal results are displayed) Labs Reviewed  I-STAT CG4 LACTIC ACID, ED - Abnormal; Notable for the following components:      Result Value   Lactic Acid, Venous 2.9 (*)    All other components within normal limits  CULTURE, BLOOD (ROUTINE X 2)  CULTURE, BLOOD (ROUTINE X 2)  RESP PANEL BY RT-PCR (RSV, FLU A&B, COVID)  RVPGX2  COMPREHENSIVE METABOLIC PANEL WITH GFR  CBC WITH DIFFERENTIAL/PLATELET  URINALYSIS, W/ REFLEX TO CULTURE (INFECTION SUSPECTED)  BRAIN NATRIURETIC PEPTIDE    EKG: None  Radiology: No results found.  {Document cardiac monitor, telemetry assessment procedure when appropriate:32947} Procedures   Medications Ordered in the ED - No data to display    {Click here for ABCD2, HEART and other calculators REFRESH Note before signing:1}                              Medical Decision Making  ***  {Document critical care time when appropriate  Document review of labs and clinical decision tools ie CHADS2VASC2, etc  Document your independent review of radiology images and  any outside records  Document your discussion with family members, caretakers and with consultants  Document social determinants of health affecting pt's care  Document your decision making why or why not admission, treatments were needed:32947:::1}   Final diagnoses:  None    ED Discharge Orders     None

## 2023-10-26 NOTE — ED Provider Triage Note (Signed)
 Emergency Medicine Provider Triage Evaluation Note  Cody Thomas , a 89 y.o. male  was evaluated in triage.  Pt complains of sob. Increased sob despite using his home O2.  Also having n/v/d for the past few days.  Occasional productive cough.  Denies pain  Review of Systems  Positive: As above Negative: As above  Physical Exam  BP 115/80 (BP Location: Right Arm)   Pulse 85   Temp (!) 101.4 F (38.6 C) (Oral)   Resp (!) 25   Ht 5' 3 (1.6 m)   Wt 69 kg   SpO2 (!) 83%   BMI 26.95 kg/m  Gen:   Awake, no distress   Resp:  Normal effort  MSK:   Moves extremities without difficulty  Other:    Medical Decision Making  Medically screening exam initiated at 7:57 PM.  Appropriate orders placed.  Cody Thomas was informed that the remainder of the evaluation will be completed by another provider, this initial triage assessment does not replace that evaluation, and the importance of remaining in the ED until their evaluation is complete.  Meet sepsis criteria. Request next available room   Cody Thomas, Cody Thomas 10/26/23 1958

## 2023-10-27 DIAGNOSIS — F411 Generalized anxiety disorder: Secondary | ICD-10-CM | POA: Diagnosis not present

## 2023-10-27 DIAGNOSIS — R6510 Systemic inflammatory response syndrome (SIRS) of non-infectious origin without acute organ dysfunction: Secondary | ICD-10-CM

## 2023-10-27 DIAGNOSIS — R7989 Other specified abnormal findings of blood chemistry: Secondary | ICD-10-CM | POA: Diagnosis present

## 2023-10-27 DIAGNOSIS — E119 Type 2 diabetes mellitus without complications: Secondary | ICD-10-CM

## 2023-10-27 DIAGNOSIS — G4733 Obstructive sleep apnea (adult) (pediatric): Secondary | ICD-10-CM

## 2023-10-27 DIAGNOSIS — I5023 Acute on chronic systolic (congestive) heart failure: Secondary | ICD-10-CM

## 2023-10-27 DIAGNOSIS — F339 Major depressive disorder, recurrent, unspecified: Secondary | ICD-10-CM

## 2023-10-27 DIAGNOSIS — E876 Hypokalemia: Secondary | ICD-10-CM

## 2023-10-27 DIAGNOSIS — J9621 Acute and chronic respiratory failure with hypoxia: Secondary | ICD-10-CM | POA: Diagnosis not present

## 2023-10-27 DIAGNOSIS — D696 Thrombocytopenia, unspecified: Secondary | ICD-10-CM

## 2023-10-27 DIAGNOSIS — N1832 Chronic kidney disease, stage 3b: Secondary | ICD-10-CM

## 2023-10-27 DIAGNOSIS — I480 Paroxysmal atrial fibrillation: Secondary | ICD-10-CM

## 2023-10-27 DIAGNOSIS — I2510 Atherosclerotic heart disease of native coronary artery without angina pectoris: Secondary | ICD-10-CM

## 2023-10-27 LAB — RESPIRATORY PANEL BY PCR

## 2023-10-27 LAB — URINALYSIS, W/ REFLEX TO CULTURE (INFECTION SUSPECTED)
Bacteria, UA: NONE SEEN
Bilirubin Urine: NEGATIVE
Glucose, UA: NEGATIVE mg/dL
Hgb urine dipstick: NEGATIVE
Ketones, ur: NEGATIVE mg/dL
Leukocytes,Ua: NEGATIVE
Nitrite: NEGATIVE
Protein, ur: NEGATIVE mg/dL
Specific Gravity, Urine: 1.009 (ref 1.005–1.030)
pH: 5 (ref 5.0–8.0)

## 2023-10-27 LAB — CBC
HCT: 33.5 % — ABNORMAL LOW (ref 39.0–52.0)
Hemoglobin: 11.7 g/dL — ABNORMAL LOW (ref 13.0–17.0)
MCH: 33.7 pg (ref 26.0–34.0)
MCHC: 34.9 g/dL (ref 30.0–36.0)
MCV: 96.5 fL (ref 80.0–100.0)
Platelets: 87 K/uL — ABNORMAL LOW (ref 150–400)
RBC: 3.47 MIL/uL — ABNORMAL LOW (ref 4.22–5.81)
RDW: 13.8 % (ref 11.5–15.5)
WBC: 7.9 K/uL (ref 4.0–10.5)
nRBC: 0 % (ref 0.0–0.2)

## 2023-10-27 LAB — GLUCOSE, CAPILLARY
Glucose-Capillary: 93 mg/dL (ref 70–99)
Glucose-Capillary: 110 mg/dL — ABNORMAL HIGH (ref 70–99)
Glucose-Capillary: 121 mg/dL — ABNORMAL HIGH (ref 70–99)
Glucose-Capillary: 116 mg/dL — ABNORMAL HIGH (ref 70–99)

## 2023-10-27 LAB — BASIC METABOLIC PANEL WITH GFR
Anion gap: 9 (ref 5–15)
BUN: 28 mg/dL — ABNORMAL HIGH (ref 8–23)
CO2: 28 mmol/L (ref 22–32)
Calcium: 8.4 mg/dL — ABNORMAL LOW (ref 8.9–10.3)
Chloride: 93 mmol/L — ABNORMAL LOW (ref 98–111)
Creatinine, Ser: 1.77 mg/dL — ABNORMAL HIGH (ref 0.61–1.24)
GFR, Estimated: 36 mL/min — ABNORMAL LOW (ref >60)
Glucose, Bld: 94 mg/dL (ref 70–99)
Potassium: 2.8 mmol/L — ABNORMAL LOW (ref 3.5–5.1)
Sodium: 130 mmol/L — ABNORMAL LOW (ref 135–145)

## 2023-10-27 LAB — PROCALCITONIN: Procalcitonin: 1.03 ng/mL

## 2023-10-27 LAB — TROPONIN I (HIGH SENSITIVITY): Troponin I (High Sensitivity): 154 ng/L (ref <18)

## 2023-10-27 LAB — MAGNESIUM: Magnesium: 1.7 mg/dL (ref 1.7–2.4)

## 2023-10-27 MED ORDER — APIXABAN 2.5 MG PO TABS
2.5000 mg | ORAL_TABLET | Freq: Two times a day (BID) | ORAL | Status: DC
  Administered 2023-10-27 – 2023-10-29 (×6): 2.5 mg via ORAL
  Filled 2023-10-27 (×6): qty 1

## 2023-10-27 MED ORDER — POTASSIUM CHLORIDE CRYS ER 20 MEQ PO TBCR: 20.0000 meq | EXTENDED_RELEASE_TABLET | Freq: Every day | ORAL | Status: DC

## 2023-10-27 MED ORDER — METRONIDAZOLE 500 MG/100ML IV SOLN
500.0000 mg | Freq: Two times a day (BID) | INTRAVENOUS | Status: DC
  Administered 2023-10-27: 500 mg via INTRAVENOUS
  Filled 2023-10-27: qty 100

## 2023-10-27 MED ORDER — VANCOMYCIN HCL 1500 MG/300ML IV SOLN
1500.0000 mg | Freq: Once | INTRAVENOUS | Status: DC
  Administered 2023-10-27: 1500 mg via INTRAVENOUS
  Filled 2023-10-27 (×3): qty 300

## 2023-10-27 MED ORDER — SODIUM CHLORIDE 0.9 % IV SOLN
2.0000 g | INTRAVENOUS | Status: DC
  Administered 2023-10-27 – 2023-10-28 (×2): 2 g via INTRAVENOUS
  Filled 2023-10-27 (×2): qty 12.5

## 2023-10-27 MED ORDER — TRAZODONE HCL 50 MG PO TABS
50.0000 mg | ORAL_TABLET | Freq: Every day | ORAL | Status: DC
  Administered 2023-10-27 – 2023-10-28 (×2): 50 mg via ORAL
  Filled 2023-10-27 (×2): qty 1

## 2023-10-27 MED ORDER — PROCHLORPERAZINE EDISYLATE 10 MG/2ML IJ SOLN: 5.0000 mg | Freq: Four times a day (QID) | INTRAMUSCULAR | Status: DC | PRN

## 2023-10-27 MED ORDER — POTASSIUM CHLORIDE CRYS ER 20 MEQ PO TBCR
20.0000 meq | EXTENDED_RELEASE_TABLET | Freq: Two times a day (BID) | ORAL | Status: DC
  Administered 2023-10-27: 20 meq via ORAL
  Filled 2023-10-27: qty 1

## 2023-10-27 MED ORDER — INSULIN ASPART 100 UNIT/ML IJ SOLN
0.0000 [IU] | Freq: Three times a day (TID) | INTRAMUSCULAR | Status: DC
  Administered 2023-10-28: 1 [IU] via SUBCUTANEOUS

## 2023-10-27 MED ORDER — POTASSIUM CHLORIDE CRYS ER 20 MEQ PO TBCR
40.0000 meq | EXTENDED_RELEASE_TABLET | Freq: Three times a day (TID) | ORAL | Status: AC
Start: 2023-10-27 — End: 2023-10-27
  Administered 2023-10-27 (×3): 40 meq via ORAL
  Filled 2023-10-27 (×3): qty 2

## 2023-10-27 MED ORDER — ACETAMINOPHEN 325 MG PO TABS: 650.0000 mg | ORAL_TABLET | Freq: Four times a day (QID) | ORAL | Status: DC | PRN

## 2023-10-27 MED ORDER — ISOSORBIDE MONONITRATE ER 30 MG PO TB24
30.0000 mg | ORAL_TABLET | Freq: Every day | ORAL | Status: DC
Start: 2023-10-27 — End: 2023-10-29
  Administered 2023-10-27 – 2023-10-29 (×3): 30 mg via ORAL
  Filled 2023-10-27 (×3): qty 1

## 2023-10-27 MED ORDER — INSULIN ASPART 100 UNIT/ML IJ SOLN: 0.0000 [IU] | Freq: Every day | INTRAMUSCULAR | Status: DC

## 2023-10-27 MED ORDER — ESCITALOPRAM OXALATE 5 MG PO TABS
2.5000 mg | ORAL_TABLET | Freq: Every evening | ORAL | Status: DC
  Administered 2023-10-27: 2.5 mg via ORAL
  Filled 2023-10-27: qty 0.5
  Filled 2023-10-27: qty 1

## 2023-10-27 MED ORDER — FUROSEMIDE 10 MG/ML IJ SOLN
40.0000 mg | Freq: Two times a day (BID) | INTRAMUSCULAR | Status: DC
  Administered 2023-10-27 – 2023-10-28 (×3): 40 mg via INTRAVENOUS
  Filled 2023-10-27 (×3): qty 4

## 2023-10-27 MED ORDER — SODIUM CHLORIDE 0.9% FLUSH
3.0000 mL | Freq: Two times a day (BID) | INTRAVENOUS | Status: DC
  Administered 2023-10-27 – 2023-10-29 (×5): 3 mL via INTRAVENOUS

## 2023-10-27 MED ORDER — METOPROLOL TARTRATE 25 MG PO TABS
25.0000 mg | ORAL_TABLET | Freq: Two times a day (BID) | ORAL | Status: DC
  Administered 2023-10-27 – 2023-10-28 (×4): 25 mg via ORAL
  Filled 2023-10-27 (×5): qty 1

## 2023-10-27 MED ORDER — VANCOMYCIN HCL IN DEXTROSE 1-5 GM/200ML-% IV SOLN: 1000.0000 mg | INTRAVENOUS | Status: DC

## 2023-10-27 MED ORDER — ACETAMINOPHEN 650 MG RE SUPP: 650.0000 mg | Freq: Four times a day (QID) | RECTAL | Status: DC | PRN

## 2023-10-27 NOTE — Progress Notes (Signed)
 PROGRESS NOTE    Cody Thomas  FMW:[id number] DOB: Oct 12, 1930 DOA: 10/26/2023 PCP: Aisha Harvey, MD  89/M with history of chronic combined CHF, paroxysmal A-fib on Eliquis , CKD 3B, hypertension, depression, anxiety, OSA, chronic respiratory failure on 2 L home O2 presented to the ED with weakness, worsening shortness of breath, nausea and swelling In the ER he was noted to be febrile to 101, more hypoxic than baseline placed on 4 L O2, labs noted sodium 128, potassium 3.3, creatinine 1.9, lactate 2.9, troponin 145, BNP 2349, chest x-ray noted cardiomegaly and pulmonary edema - Admitted, started on broad-spectrum antibiotics and diuretics  Subjective: - Feels a little better, hard of hearing  Assessment and Plan:  Acute on chronic HFmrEF; moderate mitral regurgitation Acute on chronic hypoxic respiratory failure  - EF was 45-50% with global hypokinesis, moderate MR, moderate TR, and increased PA pressures on TTE from August 2024  - Continue IV Lasix  today 40 mg twice daily -Continue Toprol  and Imdur  -Add GDMT if kidney function continues to improve   SIRS  - Febrile and tachypneic with leukocytosis on presentation,?  Viral - Started on broad-spectrum antibiotics on admission, follow cultures, lactate has improved -Will discontinue vancomycin , continue cefepime  -Check respiratory virus panel   Elevated troponin  - Patient denies chest discomfort, troponin is elevated but flat, and this likely reflects demand ischemia in setting of acute CHF and hypoxia  - Continue Toprol  and Imdur , treat underlying illness     Paroxysmal atrial fibrillation   - Rate controlled, continue Eliquis , metoprolol     CKD 3B  -Baseline creatinine appears to be 1.6-2 - Renally-dose medications, monitor      Thrombocytopenia  - Appears chronic, there is no bleeding    Type II DM  - A2c was 6.2% in June 2025  - Check CBGs and use low-intensity SSI for now      Depression, anxiety  - Lexapro ,  trazodone      OSA  - CPAP while sleeping     Hyponatremia  - Monitor closely while diuresing     DVT prophylaxis: Eliquis   Code Status: DNR per discussion with admitting MD Family Communication: No family at bedside today Disposition: Home in 2-3 days if stable  Consultants:    Procedures:   Antimicrobials:    Objective: Vitals:   10/27/23 0715 10/27/23 0730 10/27/23 0815 10/27/23 0828  BP:    121/84  Pulse: (!) 37 65  74  Resp: (!) 23 (!) 23    Temp:   97.7 F (36.5 C)   TempSrc:   Oral   SpO2: 94% 96%    Weight:      Height:        Intake/Output Summary (Last 24 hours) at 10/27/2023 1034 Last data filed at 10/27/2023 0840 Gross per 24 hour  Intake 696 ml  Output 1250 ml  Net -554 ml   Filed Weights   10/26/23 1951  Weight: 69 kg    Examination:  General exam: Elderly pleasant chronically ill male laying in bed, hard of hearing, AAO x 2 HEENT: Positive JVD CVS: S1-S2, irregular rhythm Lungs: Decreased breath sounds at the bases, few scattered rhonchi Abdomen: Soft, nontender, bowel sounds present Extremities: 1+ edema    Data Reviewed:   CBC: Recent Labs  Lab 10/26/23 2007 10/27/23 0633  WBC 12.1* 7.9  NEUTROABS 10.6*  --   HGB 13.8 11.7*  HCT 40.7 33.5*  MCV 100.0 96.5  PLT 101* 87*   Basic Metabolic Panel: Recent Labs  Lab 10/26/23 2007 10/27/23 0633  NA 128* 130*  K 3.3* 2.8*  CL 90* 93*  CO2 24 28  GLUCOSE 120* 94  BUN 28* 28*  CREATININE 1.91* 1.77*  CALCIUM  8.8* 8.4*  MG  --  1.7   GFR: Estimated Creatinine Clearance: 23.7 mL/min (A) (by C-G formula based on SCr of 1.77 mg/dL (H)). Liver Function Tests: Recent Labs  Lab 10/26/23 2007  AST 41  ALT 20  ALKPHOS 53  BILITOT 2.1*  PROT 6.9  ALBUMIN  3.6   No results for input(s): LIPASE, AMYLASE in the last 168 hours. No results for input(s): AMMONIA in the last 168 hours. Coagulation Profile: No results for input(s): INR, PROTIME in the last 168  hours. Cardiac Enzymes: No results for input(s): CKTOTAL, CKMB, CKMBINDEX, TROPONINI in the last 168 hours. BNP (last 3 results) No results for input(s): PROBNP in the last 8760 hours. HbA1C: No results for input(s): HGBA1C in the last 72 hours. CBG: Recent Labs  Lab 10/27/23 0837  GLUCAP 93   Lipid Profile: No results for input(s): CHOL, HDL, LDLCALC, TRIG, CHOLHDL, LDLDIRECT in the last 72 hours. Thyroid  Function Tests: No results for input(s): TSH, T4TOTAL, FREET4, T3FREE, THYROIDAB in the last 72 hours. Anemia Panel: No results for input(s): VITAMINB12, FOLATE, FERRITIN, TIBC, IRON, RETICCTPCT in the last 72 hours. Urine analysis:    Component Value Date/Time   COLORURINE YELLOW 10/26/2023 2100   APPEARANCEUR CLEAR 10/26/2023 2100   LABSPEC 1.009 10/26/2023 2100   PHURINE 5.0 10/26/2023 2100   GLUCOSEU NEGATIVE 10/26/2023 2100   GLUCOSEU 100 (A) 03/03/2016 0917   HGBUR NEGATIVE 10/26/2023 2100   BILIRUBINUR NEGATIVE 10/26/2023 2100   KETONESUR NEGATIVE 10/26/2023 2100   PROTEINUR NEGATIVE 10/26/2023 2100   UROBILINOGEN 0.2 03/03/2016 0917   NITRITE NEGATIVE 10/26/2023 2100   LEUKOCYTESUR NEGATIVE 10/26/2023 2100   Sepsis Labs: @LABRCNTIP (procalcitonin:4,lacticidven:4)  ) Recent Results (from the past 240 hours)  Resp panel by RT-PCR (RSV, Flu A&B, Covid) Anterior Nasal Swab     Status: None   Collection Time: 10/26/23  8:05 PM   Specimen: Anterior Nasal Swab  Result Value Ref Range Status   SARS Coronavirus 2 by RT PCR NEGATIVE NEGATIVE Final   Influenza A by PCR NEGATIVE NEGATIVE Final   Influenza B by PCR NEGATIVE NEGATIVE Final    Comment: (NOTE) The Xpert Xpress SARS-CoV-2/FLU/RSV plus assay is intended as an aid in the diagnosis of influenza from Nasopharyngeal swab specimens and should not be used as a sole basis for treatment. Nasal washings and aspirates are unacceptable for Xpert Xpress  SARS-CoV-2/FLU/RSV testing.  Fact Sheet for Patients: BloggerCourse.com  Fact Sheet for Healthcare Providers: SeriousBroker.it  This test is not yet approved or cleared by the [place]  FDA and has been authorized for detection and/or diagnosis of SARS-CoV-2 by FDA under an Emergency Use Authorization (EUA). This EUA will remain in effect (meaning this test can be used) for the duration of the COVID-19 declaration under Section 564(b)(1) of the Act, 21 U.S.C. section 360bbb-3(b)(1), unless the authorization is terminated or revoked.     Resp Syncytial Virus by PCR NEGATIVE NEGATIVE Final    Comment: (NOTE) Fact Sheet for Patients: BloggerCourse.com  Fact Sheet for Healthcare Providers: SeriousBroker.it  This test is not yet approved or cleared by the [place]  FDA and has been authorized for detection and/or diagnosis of SARS-CoV-2 by FDA under an Emergency Use Authorization (EUA). This EUA will remain in effect (meaning this test can be used) for  the duration of the COVID-19 declaration under Section 564(b)(1) of the Act, 21 U.S.C. section 360bbb-3(b)(1), unless the authorization is terminated or revoked.  Performed at [facility], [address] [place], [place] [address]   Blood culture (routine x 2)     Status: None (Preliminary result)   Collection Time: 10/26/23  8:07 PM   Specimen: BLOOD LEFT ARM  Result Value Ref Range Status   Specimen Description BLOOD LEFT ARM  Final   Special Requests   Final    BOTTLES DRAWN AEROBIC AND ANAEROBIC Blood Culture adequate volume   Culture   Final    NO GROWTH < 12 HOURS Performed at [facility], [address] [place], [place] [address]    Report Status PENDING  Incomplete  Blood culture (routine x 2)     Status: None (Preliminary result)   Collection Time: 10/26/23  9:00 PM   Specimen: BLOOD  Result  Value Ref Range Status   Specimen Description BLOOD BLOOD RIGHT ARM  Final   Special Requests   Final    BOTTLES DRAWN AEROBIC AND ANAEROBIC Blood Culture adequate volume   Culture   Final    NO GROWTH < 12 HOURS Performed at [facility], [address], [place], [place] [address]    Report Status PENDING  Incomplete     Radiology Studies: DG Chest 2 View Result Date: 10/26/2023 CLINICAL DATA:  [id number] SOB (shortness of breath) [id number] EXAM: CHEST - 2 VIEW COMPARISON:  Chest x-ray 12/17/2022, CT chest 10/29/2016 FINDINGS: The heart and mediastinal contours are unchanged. Atherosclerotic plaque. Prominent hilar vasculature. Coronary artery stent. No focal consolidation. Increased interstitial markings. No pleural effusion. No pneumothorax. No acute osseous abnormality.  Intact sternotomy wires. IMPRESSION: Cardiomegaly with pulmonary edema. Electronically Signed   By: Morgane  Naveau M.D.   On: 10/26/2023 20:43     Scheduled Meds:  apixaban   2.5 mg Oral BID   escitalopram   2.5 mg Oral QPM   furosemide   40 mg Intravenous Q12H   insulin  aspart  0-5 Units Subcutaneous QHS   insulin  aspart  0-6 Units Subcutaneous TID WC   isosorbide  mononitrate  30 mg Oral Daily   metoprolol  tartrate  25 mg Oral BID   potassium chloride   40 mEq Oral TID   sodium chloride  flush  3 mL Intravenous Q12H   traZODone   50 mg Oral QHS   Continuous Infusions:  ceFEPime  (MAXIPIME ) IV Stopped (10/27/23 0423)   metronidazole  500 mg (10/27/23 0328)     LOS: 1 day    Time spent:    Sigurd Pac, MD [facility]   10/27/2023, 10:34 AM

## 2023-10-27 NOTE — Progress Notes (Signed)
 Pharmacy Antibiotic Note  Cody Thomas is a 89 y.o. male admitted on 10/26/2023 with sepsis.  Pharmacy has been consulted for Vancomycin  dosing. WBC mildly elevated, noted renal dysfunction.   Plan: Vancomycin  1500 mg IV x 1, then 1000 mg IV q48h >>>Estimated AUC: 474 Cefepime  per MD Trend WBC, temp, renal function  F/U infectious work-up Drug levels as indicated   Height: 5' 3 (160 cm) Weight: 69 kg (152 lb 1.9 oz) IBW/kg (Calculated) : 56.9  Temp (24hrs), Avg:99.9 F (37.7 C), Min:98.2 F (36.8 C), Max:101.4 F (38.6 C)  Recent Labs  Lab 10/26/23 2007 10/26/23 2010 10/26/23 2248  WBC 12.1*  --   --   CREATININE 1.91*  --   --   LATICACIDVEN  --  2.9* 1.2    Estimated Creatinine Clearance: 22 mL/min (A) (by C-G formula based on SCr of 1.91 mg/dL (H)).    Allergies  Allergen Reactions   Crestor [Rosuvastatin] Other (See Comments)    Nightmares    Sertraline Diarrhea   Zocor [Simvastatin] Other (See Comments)    Nightmares     Lynwood Mckusick, PharmD, BCPS Clinical Pharmacist Phone: [phone]

## 2023-10-27 NOTE — Evaluation (Signed)
 Clinical/Bedside Swallow Evaluation Patient Details  Name: Cody Thomas MRN: [id number] Date of Birth: 1930-09-03  Today's Date: 10/27/2023 Time: SLP Start Time (ACUTE ONLY): 0830 SLP Stop Time (ACUTE ONLY): 0850 SLP Time Calculation (min) (ACUTE ONLY): 20 min  Past Medical History:  Past Medical History:  Diagnosis Date   Anxiety    Bunion 09/29/2011   Cancer (HCC)    nose/Dr. Albertini;skin   Chronic constipation    Chronic kidney disease    stage 111   Coronary atherosclerosis of native coronary artery    2009 LAD CIRC DES   Depression    doesn't take any meds for this   Diabetes mellitus    takes Januvia  daily   Dizziness    Enlarged prostate    but doesn't require meds at present   GERD (gastroesophageal reflux disease)    TUMS prn   H/O hiatal hernia    Headache(784.0)    Heart failure with mildly reduced ejection fraction (HFmrEF) (HCC) 07/12/2022   - TTE 07/10/2022: EF 40-45, global HK, moderate LVH, moderately reduced RVSF, severe pulmonary hypertension, RVSP 63.4, severe BAE, moderate MR, moderate TR, trivial AI, AV sclerosis, RAP 15    - TTE 10/02/22: EF 45-50, global HK, mod LVH, severe BAE, mod MR, mod TR, mild AI, AV sclerosis, mod pulm HTN, RVSP 54.6, RAP 3      History of gout    doesn't require meds   Hx of cardiac cath    Hyperlipidemia    Crestor 3 x wk and Zetia  daily   Hypertension    takes Hyzaar daily   Insomnia    doesn't require meds at present time   Joint pain    Onychomycosis 10/05/2012   x 10   PONV (postoperative nausea and vomiting)    pt states extremely sick   Shortness of breath    with exertion   Past Surgical History:  Past Surgical History:  Procedure Laterality Date   bladder cancer     CARDIAC CATHETERIZATION  2013   cataracts removed     CHOLECYSTECTOMY     COLONOSCOPY     CORONARY ANGIOPLASTY     2 stents from 2009   CORONARY ARTERY BYPASS GRAFT  10/29/2011   Procedure: CORONARY ARTERY BYPASS GRAFTING (CABG);   Surgeon: Maude Fleeta Ochoa, MD;  Location: [facility] OR;  Service: Open Heart Surgery;  Laterality: N/A;   cyst removed from finger     right hand   EYE SURGERY     bilateral cataracts   HEMORRHOID SURGERY     HERNIA REPAIR     x 2;inguinal   KIDNEY STONE SURGERY     LEFT HEART CATHETERIZATION WITH CORONARY ANGIOGRAM N/A 10/21/2011   Procedure: LEFT HEART CATHETERIZATION WITH CORONARY ANGIOGRAM;  Surgeon: Oneil Parchment, MD;  Location: [facility] CATH LAB;  Service: Cardiovascular;  Laterality: N/A;   rotator cuff surgery     right    skin cancer removed     HPI:  Cody Thomas is a 89 y.o. male with medical history significant for hypertension, CAD, HFmrEF, atrial fibrillation on Eliquis , CKD 3B, depression, and anxiety, OSA, and chronic hypoxic respiratory failure who presents with increased shortness of breath, weakness, leg swelling, nausea, and diarrhea.     Patient's shortness of breath, leg swelling, and oxygen saturations have all worsened over the past 2 weeks per report of family.  Patient also complains of progressive fatigue over this interval and has more recently developed nausea without vomiting.  He was having loose stools yesterday which he states improved after Pepto-Bismol.  There is no abdominal pain associated with this and he denies chest pain.  He had not been experiencing any subjective fever or chills.    Assessment / Plan / Recommendation  Clinical Impression  A bedside swallow evaluation was completed to assess for s/sx of oropharyngeal dysphagia. Oral mechanism exam WFL. POs administered included thin liquids, puree and solids. Patient with mildly prolonged mastication and complete oral clearance with use of liquid wash. No s/sx of aspiration present during PO intake, however observed x1 throat clear at end of breakfast before speaking to SLP (hx of GERD). Recommend regular/thin diet with use of standardized precautions including sitting upright during PO, taking small bites/sips at a  slow rate and liquid wash.    SLP Visit Diagnosis: Dysphagia, unspecified (R13.10)    Aspiration Risk  Mild aspiration risk    Diet Recommendation Regular;Thin liquid    Liquid Administration via: Cup;Straw Medication Administration: Whole meds with liquid Supervision: Patient able to self feed Compensations: Slow rate;Small sips/bites Postural Changes: Seated upright at 90 degrees    Other  Recommendations Oral Care Recommendations: Oral care BID     Functional Status Assessment Patient has had a recent decline in their functional status and demonstrates the ability to make significant improvements in function in a reasonable and predictable amount of time.  Frequency and Duration min 1 x/week  1 week       Prognosis Prognosis for improved oropharyngeal function: Good      Swallow Study   General HPI: Cody Thomas is a 89 y.o. male with medical history significant for hypertension, CAD, HFmrEF, atrial fibrillation on Eliquis , CKD 3B, depression, and anxiety, OSA, and chronic hypoxic respiratory failure who presents with increased shortness of breath, weakness, leg swelling, nausea, and diarrhea.     Patient's shortness of breath, leg swelling, and oxygen saturations have all worsened over the past 2 weeks per report of family.  Patient also complains of progressive fatigue over this interval and has more recently developed nausea without vomiting.  He was having loose stools yesterday which he states improved after Pepto-Bismol.  There is no abdominal pain associated with this and he denies chest pain.  He had not been experiencing any subjective fever or chills. Type of Study: Bedside Swallow Evaluation Previous Swallow Assessment: none Diet Prior to this Study: Regular;Thin liquids (Level 0) Temperature Spikes Noted: Yes Respiratory Status: Nasal cannula (3L) Behavior/Cognition: Alert;Cooperative Oral Cavity Assessment: Within Functional Limits Oral Care Completed by  SLP: No Oral Cavity - Dentition: Dentures, bottom;Dentures, top Vision: Functional for self-feeding Self-Feeding Abilities: Able to feed self Patient Positioning: Upright in bed Volitional Cough: Strong Volitional Swallow: Able to elicit    Oral/Motor/Sensory Function Overall Oral Motor/Sensory Function: Within functional limits   Ice Chips Ice chips: Not tested   Thin Liquid Thin Liquid: Within functional limits Presentation: Self Fed;Straw    Nectar Thick Nectar Thick Liquid: Not tested   Honey Thick Honey Thick Liquid: Not tested   Puree Puree: Within functional limits Presentation: Self Fed;Spoon   Solid     Solid: Impaired Oral Phase Impairments: Impaired mastication Oral Phase Functional Implications: Impaired mastication Pharyngeal Phase Impairments: Throat Clearing - Delayed Other Comments: delayed throat clear before speaking at end of PO      Jameison Haji M.A., CCC-SLP 10/27/2023,9:01 AM

## 2023-10-27 NOTE — Progress Notes (Signed)
   10/27/23 2044  BiPAP/CPAP/SIPAP  Reason BIPAP/CPAP not in use Other(comment) (Patient states he does not wear CPAP.  Patient wears oxygen)  BiPAP/CPAP /SiPAP Vitals  Pulse Rate 60  Resp 16  SpO2 96 %  MEWS Score/Color  MEWS Score 0  MEWS Score Color Landy

## 2023-10-27 NOTE — ED Notes (Signed)
 PT'S TROP-[id number], MD MADE NOTIFIED

## 2023-10-27 NOTE — H&P (Addendum)
 History and Physical    TADHG ESKEW FMW:[id number] DOB: 11/18/1930 DOA: 10/26/2023  PCP: Aisha Harvey, MD   Patient coming from: Home   Chief Complaint: SOB, fatigue, leg swelling, nausea, diarrhea   HPI: Cody Thomas is a 89 y.o. male with medical history significant for hypertension, CAD, HFmrEF, atrial fibrillation on Eliquis , CKD 3B, depression, and anxiety, OSA, and chronic hypoxic respiratory failure who presents with increased shortness of breath, weakness, leg swelling, nausea, and diarrhea.  Patient's shortness of breath, leg swelling, and oxygen saturations have all worsened over the past 2 weeks per report of family.  Patient also complains of progressive fatigue over this interval and has more recently developed nausea without vomiting.  He was having loose stools yesterday which he states improved after Pepto-Bismol.  There is no abdominal pain associated with this and he denies chest pain.  He had not been experiencing any subjective fever or chills.  ED Course: Upon arrival to the ED, patient is found to be febrile to 38.6 C and saturating 80% on his usual 2 L/min supplemental oxygen with tachypnea, normal HR, and stable BP.  Labs are most notable for sodium 128, potassium 3.3, creatinine 1.91, WBC 12,100, lactic acid 2.9, troponin 145, and BNP 2349.  Chest x-ray demonstrates cardiomegaly with pulmonary edema.  Blood cultures were collected in the ED and the patient was treated with acetaminophen , Rocephin , azithromycin , and IV Lasix .  Review of Systems:  All other systems reviewed and apart from HPI, are negative.  Past Medical History:  Diagnosis Date   Anxiety    Bunion 09/29/2011   Cancer (HCC)    nose/Dr. Albertini;skin   Chronic constipation    Chronic kidney disease    stage 111   Coronary atherosclerosis of native coronary artery    2009 LAD CIRC DES   Depression    doesn't take any meds for this   Diabetes mellitus    takes Januvia  daily    Dizziness    Enlarged prostate    but doesn't require meds at present   GERD (gastroesophageal reflux disease)    TUMS prn   H/O hiatal hernia    Headache(784.0)    Heart failure with mildly reduced ejection fraction (HFmrEF) (HCC) 07/12/2022   - TTE 07/10/2022: EF 40-45, global HK, moderate LVH, moderately reduced RVSF, severe pulmonary hypertension, RVSP 63.4, severe BAE, moderate MR, moderate TR, trivial AI, AV sclerosis, RAP 15    - TTE 10/02/22: EF 45-50, global HK, mod LVH, severe BAE, mod MR, mod TR, mild AI, AV sclerosis, mod pulm HTN, RVSP 54.6, RAP 3      History of gout    doesn't require meds   Hx of cardiac cath    Hyperlipidemia    Crestor 3 x wk and Zetia  daily   Hypertension    takes Hyzaar daily   Insomnia    doesn't require meds at present time   Joint pain    Onychomycosis 10/05/2012   x 10   PONV (postoperative nausea and vomiting)    pt states extremely sick   Shortness of breath    with exertion    Past Surgical History:  Procedure Laterality Date   bladder cancer     CARDIAC CATHETERIZATION  2013   cataracts removed     CHOLECYSTECTOMY     COLONOSCOPY     CORONARY ANGIOPLASTY     2 stents from 2009   CORONARY ARTERY BYPASS GRAFT  10/29/2011   Procedure: CORONARY  ARTERY BYPASS GRAFTING (CABG);  Surgeon: Maude Fleeta Ochoa, MD;  Location: [facility] OR;  Service: Open Heart Surgery;  Laterality: N/A;   cyst removed from finger     right hand   EYE SURGERY     bilateral cataracts   HEMORRHOID SURGERY     HERNIA REPAIR     x 2;inguinal   KIDNEY STONE SURGERY     LEFT HEART CATHETERIZATION WITH CORONARY ANGIOGRAM N/A 10/21/2011   Procedure: LEFT HEART CATHETERIZATION WITH CORONARY ANGIOGRAM;  Surgeon: Oneil Parchment, MD;  Location: [facility] CATH LAB;  Service: Cardiovascular;  Laterality: N/A;   rotator cuff surgery     right    skin cancer removed      Social History:   reports that he quit smoking about 39 years ago. His smoking use included cigarettes. He started  smoking about 69 years ago. He has a 45 pack-year smoking history. He has never used smokeless tobacco. He reports that he does not drink alcohol and does not use drugs.  Allergies  Allergen Reactions   Crestor [Rosuvastatin] Other (See Comments)    Nightmares    Sertraline Diarrhea   Zocor [Simvastatin] Other (See Comments)    Nightmares     Family History  Problem Relation Age of Onset   Heart disease Mother    Stroke Father    Cancer Brother      Prior to Admission medications   Medication Sig Start Date End Date Taking? Authorizing Provider  acetaminophen  (TYLENOL ) 650 MG CR tablet Take 650 mg by mouth every 8 (eight) hours as needed for pain.   Yes [provider]  apixaban  (ELIQUIS ) 2.5 MG TABS tablet Take 1 tablet (2.5 mg total) by mouth 2 (two) times daily. 08/10/23  Yes Parchment Oneil BROCKS, MD  escitalopram  (LEXAPRO ) 5 MG tablet Take 2.5 mg by mouth every evening. 04/17/21  Yes [provider]  fluticasone  (FLONASE ) 50 MCG/ACT nasal spray Place 1 spray into both nostrils daily.   Yes [provider]  furosemide  (LASIX ) 40 MG tablet TAKE 1 TABLET (40 MG TOTAL) BY MOUTH EVERY MORNING AND 0.5 TABLETS (20 MG TOTAL) EVERY EVENING. 03/30/23  Yes Parchment Oneil BROCKS, MD  hydrALAZINE  (APRESOLINE ) 25 MG tablet TAKE 1 TABLET BY MOUTH THREE TIMES A DAY 08/30/23  Yes Weaver, [name] T, PA-C  isosorbide  mononitrate (IMDUR ) 30 MG 24 hr tablet TAKE 1 TABLET BY MOUTH EVERY DAY 06/01/23  Yes Parchment Oneil BROCKS, MD  metoprolol  tartrate (LOPRESSOR ) 25 MG tablet TAKE 1 TABLET BY MOUTH TWICE A DAY 04/05/23  Yes Patwardhan, Manish J, MD  potassium chloride  SA (KLOR-CON  M20) 20 MEQ tablet Take 1 tablet (20 mEq total) by mouth daily. 12/10/22  Yes Parchment Oneil BROCKS, MD  traZODone  (DESYREL ) 50 MG tablet Take 50 mg by mouth at bedtime. 08/22/20  Yes [provider]  Glucose Blood (BLOOD GLUCOSE TEST STRIPS 333) STRP 1 strip by In Vitro route 3 (three) times daily. 10/09/22   Von [place], MD     Physical Exam: Vitals:   10/26/23 2100 10/26/23 2130 10/26/23 2200 10/27/23 0037  BP: 135/75 105/73 105/64   Pulse: 77 76 85   Resp: (!) 27 (!) 21 (!) 21   Temp:    98.2 F (36.8 C)  TempSrc:    Oral  SpO2: 99% 100% 100%   Weight:      Height:        Constitutional: NAD, no pallor or diaphoresis  Eyes: PERTLA, lids and conjunctivae normal ENMT:  Mucous membranes are moist. Posterior pharynx clear of any exudate or lesions.   Neck: supple, no masses  Respiratory: Fine rales bilaterally. No wheezing.  Cardiovascular: Rate ~100 and irregularly irregular. Pretibial pitting edema.  Abdomen: No tenderness, soft. Bowel sounds active.  Musculoskeletal: no clubbing / cyanosis. No joint deformity upper and lower extremities.   Skin: no significant rashes, lesions, ulcers. Warm, dry, well-perfused. Neurologic: CN 2-12 grossly intact aside from gross hearing deficit. Moving all extremities. Alert and oriented.  Psychiatric: Pleasant. Cooperative.    Labs and Imaging on Admission: I have personally reviewed following labs and imaging studies  CBC: Recent Labs  Lab 10/26/23 2007  WBC 12.1*  NEUTROABS 10.6*  HGB 13.8  HCT 40.7  MCV 100.0  PLT 101*   Basic Metabolic Panel: Recent Labs  Lab 10/26/23 2007  NA 128*  K 3.3*  CL 90*  CO2 24  GLUCOSE 120*  BUN 28*  CREATININE 1.91*  CALCIUM  8.8*   GFR: Estimated Creatinine Clearance: 22 mL/min (A) (by C-G formula based on SCr of 1.91 mg/dL (H)). Liver Function Tests: Recent Labs  Lab 10/26/23 2007  AST 41  ALT 20  ALKPHOS 53  BILITOT 2.1*  PROT 6.9  ALBUMIN  3.6   No results for input(s): LIPASE, AMYLASE in the last 168 hours. No results for input(s): AMMONIA in the last 168 hours. Coagulation Profile: No results for input(s): INR, PROTIME in the last 168 hours. Cardiac Enzymes: No results for input(s): CKTOTAL, CKMB, CKMBINDEX, TROPONINI in the last 168 hours. BNP (last 3 results) No results  for input(s): PROBNP in the last 8760 hours. HbA1C: No results for input(s): HGBA1C in the last 72 hours. CBG: No results for input(s): GLUCAP in the last 168 hours. Lipid Profile: No results for input(s): CHOL, HDL, LDLCALC, TRIG, CHOLHDL, LDLDIRECT in the last 72 hours. Thyroid  Function Tests: No results for input(s): TSH, T4TOTAL, FREET4, T3FREE, THYROIDAB in the last 72 hours. Anemia Panel: No results for input(s): VITAMINB12, FOLATE, FERRITIN, TIBC, IRON, RETICCTPCT in the last 72 hours. Urine analysis:    Component Value Date/Time   COLORURINE YELLOW 10/26/2023 2100   APPEARANCEUR CLEAR 10/26/2023 2100   LABSPEC 1.009 10/26/2023 2100   PHURINE 5.0 10/26/2023 2100   GLUCOSEU NEGATIVE 10/26/2023 2100   GLUCOSEU 100 (A) 03/03/2016 0917   HGBUR NEGATIVE 10/26/2023 2100   BILIRUBINUR NEGATIVE 10/26/2023 2100   KETONESUR NEGATIVE 10/26/2023 2100   PROTEINUR NEGATIVE 10/26/2023 2100   UROBILINOGEN 0.2 03/03/2016 0917   NITRITE NEGATIVE 10/26/2023 2100   LEUKOCYTESUR NEGATIVE 10/26/2023 2100   Sepsis Labs: @LABRCNTIP (procalcitonin:4,lacticidven:4) ) Recent Results (from the past 240 hours)  Resp panel by RT-PCR (RSV, Flu A&B, Covid) Anterior Nasal Swab     Status: None   Collection Time: 10/26/23  8:05 PM   Specimen: Anterior Nasal Swab  Result Value Ref Range Status   SARS Coronavirus 2 by RT PCR NEGATIVE NEGATIVE Final   Influenza A by PCR NEGATIVE NEGATIVE Final   Influenza B by PCR NEGATIVE NEGATIVE Final    Comment: (NOTE) The Xpert Xpress SARS-CoV-2/FLU/RSV plus assay is intended as an aid in the diagnosis of influenza from Nasopharyngeal swab specimens and should not be used as a sole basis for treatment. Nasal washings and aspirates are unacceptable for Xpert Xpress SARS-CoV-2/FLU/RSV testing.  Fact Sheet for Patients: BloggerCourse.com  Fact Sheet for Healthcare  Providers: SeriousBroker.it  This test is not yet approved or cleared by the [place]  FDA and has been authorized for detection and/or diagnosis  of SARS-CoV-2 by FDA under an Emergency Use Authorization (EUA). This EUA will remain in effect (meaning this test can be used) for the duration of the COVID-19 declaration under Section 564(b)(1) of the Act, 21 U.S.C. section 360bbb-3(b)(1), unless the authorization is terminated or revoked.     Resp Syncytial Virus by PCR NEGATIVE NEGATIVE Final    Comment: (NOTE) Fact Sheet for Patients: BloggerCourse.com  Fact Sheet for Healthcare Providers: SeriousBroker.it  This test is not yet approved or cleared by the [place]  FDA and has been authorized for detection and/or diagnosis of SARS-CoV-2 by FDA under an Emergency Use Authorization (EUA). This EUA will remain in effect (meaning this test can be used) for the duration of the COVID-19 declaration under Section 564(b)(1) of the Act, 21 U.S.C. section 360bbb-3(b)(1), unless the authorization is terminated or revoked.  Performed at [facility], [address] [place], [place] [address]      Radiological Exams on Admission: DG Chest 2 View Result Date: 10/26/2023 CLINICAL DATA:  141880 SOB (shortness of breath) [id number] EXAM: CHEST - 2 VIEW COMPARISON:  Chest x-ray 12/17/2022, CT chest 10/29/2016 FINDINGS: The heart and mediastinal contours are unchanged. Atherosclerotic plaque. Prominent hilar vasculature. Coronary artery stent. No focal consolidation. Increased interstitial markings. No pleural effusion. No pneumothorax. No acute osseous abnormality.  Intact sternotomy wires. IMPRESSION: Cardiomegaly with pulmonary edema. Electronically Signed   By: Morgane  Naveau M.D.   On: 10/26/2023 20:43    EKG: Independently reviewed. Atrial fibrillation, PVCs, LAFB.   Assessment/Plan   1. Acute on chronic  HFmrEF; acute on chronic hypoxic respiratory failure  - EF was 45-50% with global hypokinesis, moderate MR, moderate TR, and increased PA pressures on TTE from August 2024  - Continue diuresis with IV Lasix , monitor weight and I/Os, monitor renal function and electrolytes   2. SIRS  - Febrile and tachypneic with leukocytosis on presentation  - Sxs are respiratory and GI, there is no apparent infection on CXR or UA, and abdominal exam is benign  - Check C diff and stool pathogen panel if diarrhea recurs, check expanded respiratory virus panel, continue antibiotics for now, follow culture and clinical course   3. Elevated troponin  - Patient denies chest discomfort, troponin is elevated but flat, and this likely reflects demand ischemia in setting of acute CHF and hypoxia  - Continue Toprol  and Imdur , treat underlying illness    4. PAF  - Eliquis , metoprolol    5. CKD 3B  - Renally-dose medications, monitor    6. Thrombocytopenia  - Appears chronic, there is no bleeding   7. Type II DM  - A2c was 6.2% in June 2025  - Check CBGs and use low-intensity SSI for now    8. Depression, anxiety  - Lexapro , trazodone    9. OSA  - CPAP while sleeping    10. Hyponatremia  - Monitor closely while diuresing   DVT prophylaxis: Eliquis   Code Status: DNR, discussed with patient and his family in ED  Level of Care: Level of care: Progressive Family Communication: Son and granddaughter at bedside  Disposition Plan:  Patient is from: home  Anticipated d/c is to: TBD Anticipated d/c date is: 10/29/33  Patient currently: pending improved respiratory status  Consults called: None  Admission status: Inpatient     Evalene GORMAN Sprinkles, MD [facility]  10/27/2023, 2:13 AM

## 2023-10-27 NOTE — ED Notes (Signed)
 PT IS HOH ON HIS LEFT SIDE

## 2023-10-28 DIAGNOSIS — I5023 Acute on chronic systolic (congestive) heart failure: Secondary | ICD-10-CM | POA: Diagnosis not present

## 2023-10-28 LAB — HEMOGLOBIN A1C
Hgb A1c MFr Bld: 5.6 % (ref 4.8–5.6)
Mean Plasma Glucose: 114 mg/dL

## 2023-10-28 LAB — BASIC METABOLIC PANEL WITH GFR
Anion gap: 11 (ref 5–15)
BUN: 35 mg/dL — ABNORMAL HIGH (ref 8–23)
CO2: 28 mmol/L (ref 22–32)
Calcium: 8.9 mg/dL (ref 8.9–10.3)
Chloride: 97 mmol/L — ABNORMAL LOW (ref 98–111)
Creatinine, Ser: 1.75 mg/dL — ABNORMAL HIGH (ref 0.61–1.24)
GFR, Estimated: 36 mL/min — ABNORMAL LOW (ref >60)
Glucose, Bld: 92 mg/dL (ref 70–99)
Potassium: 3.9 mmol/L (ref 3.5–5.1)
Sodium: 136 mmol/L (ref 135–145)

## 2023-10-28 LAB — CBC
HCT: 37.3 % — ABNORMAL LOW (ref 39.0–52.0)
Hemoglobin: 12.9 g/dL — ABNORMAL LOW (ref 13.0–17.0)
MCH: 33.5 pg (ref 26.0–34.0)
MCHC: 34.6 g/dL (ref 30.0–36.0)
MCV: 96.9 fL (ref 80.0–100.0)
Platelets: 96 K/uL — ABNORMAL LOW (ref 150–400)
RBC: 3.85 MIL/uL — ABNORMAL LOW (ref 4.22–5.81)
RDW: 13.7 % (ref 11.5–15.5)
WBC: 4.9 K/uL (ref 4.0–10.5)
nRBC: 0 % (ref 0.0–0.2)

## 2023-10-28 LAB — GLUCOSE, CAPILLARY
Glucose-Capillary: 94 mg/dL (ref 70–99)
Glucose-Capillary: 169 mg/dL — ABNORMAL HIGH (ref 70–99)
Glucose-Capillary: 104 mg/dL — ABNORMAL HIGH (ref 70–99)
Glucose-Capillary: 151 mg/dL — ABNORMAL HIGH (ref 70–99)

## 2023-10-28 MED ORDER — SACCHAROMYCES BOULARDII 250 MG PO CAPS
250.0000 mg | ORAL_CAPSULE | Freq: Two times a day (BID) | ORAL | Status: DC
  Administered 2023-10-28 – 2023-10-29 (×3): 250 mg via ORAL
  Filled 2023-10-28 (×3): qty 1

## 2023-10-28 MED ORDER — FUROSEMIDE 40 MG PO TABS
40.0000 mg | ORAL_TABLET | Freq: Every day | ORAL | Status: DC
  Administered 2023-10-28 – 2023-10-29 (×2): 40 mg via ORAL
  Filled 2023-10-28 (×2): qty 1

## 2023-10-28 MED ORDER — CALCIUM POLYCARBOPHIL 625 MG PO TABS
625.0000 mg | ORAL_TABLET | Freq: Every day | ORAL | Status: DC
  Administered 2023-10-28 – 2023-10-29 (×2): 625 mg via ORAL
  Filled 2023-10-28 (×3): qty 1

## 2023-10-28 MED ORDER — CEFUROXIME AXETIL 250 MG PO TABS
250.0000 mg | ORAL_TABLET | Freq: Two times a day (BID) | ORAL | Status: DC
  Administered 2023-10-28 – 2023-10-29 (×2): 250 mg via ORAL
  Filled 2023-10-28 (×3): qty 1

## 2023-10-28 MED ORDER — ESCITALOPRAM OXALATE 5 MG PO TABS
2.5000 mg | ORAL_TABLET | Freq: Every evening | ORAL | Status: DC
  Administered 2023-10-28: 2.5 mg via ORAL
  Filled 2023-10-28 (×2): qty 1
  Filled 2023-10-28: qty 0.5

## 2023-10-28 NOTE — Progress Notes (Addendum)
 PROGRESS NOTE    Cody Thomas  FMW:[id number] DOB: [birth year] DOA: 10/26/2023 PCP: Aisha Harvey, MD  89/M with history of chronic combined CHF, paroxysmal A-fib on Eliquis , CKD 3B, hypertension, depression, anxiety, OSA, chronic respiratory failure on 2 L home O2 presented to the ED with weakness, worsening shortness of breath, nausea and swelling In the ER he was noted to be febrile to 101, more hypoxic than baseline placed on 4 L O2, labs noted sodium 128, potassium 3.3, creatinine 1.9, lactate 2.9, troponin 145, BNP 2349, chest x-ray noted cardiomegaly and pulmonary edema - Admitted, started on broad-spectrum antibiotics and diuretics  Subjective: - Feels a little better, hard of hearing  Assessment and Plan:  Acute on chronic HFmrEF; moderate mitral regurgitation Acute on chronic hypoxic respiratory failure  - EF was 45-50% with global hypokinesis, moderate MR, moderate TR, and increased PA pressures on TTE from August 2024  - Improving with diuresis, urine output is inaccurate, appears close to euvolemic will transition to oral Lasix  today -Continue Toprol  and Imdur  -Add GDMT if kidney function continues to improve -Increase activity, PT OT eval   Fever/SIRS Suspected bronchitis - Febrile and tachypneic with leukocytosis on presentation,? -?  Bronchitis, respiratory virus panel is negative, discontinue cefepime , switch to oral cefuroxime  for 5-day course   Elevated troponin  - Patient denies chest discomfort, troponin is elevated but flat, and this likely reflects demand ischemia in setting of acute CHF and hypoxia  - Continue Toprol  and Imdur , treat underlying illness     Permanent atrial fibrillation   - Rate controlled, continue Eliquis , metoprolol     CKD 3B  -Baseline creatinine appears to be 1.6-2 - Renally-dose medications, monitor      Thrombocytopenia  - Appears chronic, there is no bleeding    Type II DM  - A2c was 6.2% in June 2025  - Check CBGs and  use low-intensity SSI for now      Depression, anxiety  - Lexapro , trazodone      OSA  - CPAP while sleeping     Hyponatremia  - Monitor closely while diuresing     DVT prophylaxis: Eliquis   Code Status: DNR per discussion with admitting MD Family Communication: No family at bedside today Disposition: Home in 1 to 2 days  Consultants:    Procedures:   Antimicrobials:    Objective: Vitals:   10/27/23 2120 10/27/23 2257 10/28/23 0429 10/28/23 0828  BP: 121/84 111/69 (!) 94/47 126/80  Pulse: 87 61 61 62  Resp: 20 20 20 20   Temp: 97.8 F (36.6 C) 97.8 F (36.6 C) 97.8 F (36.6 C) 97.9 F (36.6 C)  TempSrc: Oral Oral Oral Oral  SpO2: 96% 97% 98% 97%  Weight:      Height:        Intake/Output Summary (Last 24 hours) at 10/28/2023 1229 Last data filed at 10/27/2023 8084 Gross per 24 hour  Intake 360 ml  Output 675 ml  Net -315 ml   Filed Weights   10/26/23 1951  Weight: 69 kg    Examination:  General exam: Elderly pleasant chronically ill male laying in bed, hard of hearing, AAO x 2 HEENT: No JVD CVS: S1-S2, irregular rhythm Lungs: Decreased breath sounds at the bases, few scattered rhonchi Abdomen: Soft, nontender, bowel sounds present Extremities: Trace edema    Data Reviewed:   CBC: Recent Labs  Lab 10/26/23 2007 10/27/23 0633 10/28/23 0350  WBC 12.1* 7.9 4.9  NEUTROABS 10.6*  --   --  HGB 13.8 11.7* 12.9*  HCT 40.7 33.5* 37.3*  MCV 100.0 96.5 96.9  PLT 101* 87* 96*   Basic Metabolic Panel: Recent Labs  Lab 10/26/23 2007 10/27/23 0633 10/28/23 0350  NA 128* 130* 136  K 3.3* 2.8* 3.9  CL 90* 93* 97*  CO2 24 28 28   GLUCOSE 120* 94 92  BUN 28* 28* 35*  CREATININE 1.91* 1.77* 1.75*  CALCIUM  8.8* 8.4* 8.9  MG  --  1.7  --    GFR: Estimated Creatinine Clearance: 24 mL/min (A) (by C-G formula based on SCr of 1.75 mg/dL (H)). Liver Function Tests: Recent Labs  Lab 10/26/23 2007  AST 41  ALT 20  ALKPHOS 53  BILITOT 2.1*  PROT  6.9  ALBUMIN  3.6   No results for input(s): LIPASE, AMYLASE in the last 168 hours. No results for input(s): AMMONIA in the last 168 hours. Coagulation Profile: No results for input(s): INR, PROTIME in the last 168 hours. Cardiac Enzymes: No results for input(s): CKTOTAL, CKMB, CKMBINDEX, TROPONINI in the last 168 hours. BNP (last 3 results) No results for input(s): PROBNP in the last 8760 hours. HbA1C: Recent Labs    10/27/23 0633  HGBA1C 5.6   CBG: Recent Labs  Lab 10/27/23 0837 10/27/23 1248 10/27/23 1804 10/27/23 2119 10/28/23 0549  GLUCAP 93 110* 121* 116* 94   Lipid Profile: No results for input(s): CHOL, HDL, LDLCALC, TRIG, CHOLHDL, LDLDIRECT in the last 72 hours. Thyroid  Function Tests: No results for input(s): TSH, T4TOTAL, FREET4, T3FREE, THYROIDAB in the last 72 hours. Anemia Panel: No results for input(s): VITAMINB12, FOLATE, FERRITIN, TIBC, IRON, RETICCTPCT in the last 72 hours. Urine analysis:    Component Value Date/Time   COLORURINE YELLOW 10/26/2023 2100   APPEARANCEUR CLEAR 10/26/2023 2100   LABSPEC 1.009 10/26/2023 2100   PHURINE 5.0 10/26/2023 2100   GLUCOSEU NEGATIVE 10/26/2023 2100   GLUCOSEU 100 (A) 03/03/2016 0917   HGBUR NEGATIVE 10/26/2023 2100   BILIRUBINUR NEGATIVE 10/26/2023 2100   KETONESUR NEGATIVE 10/26/2023 2100   PROTEINUR NEGATIVE 10/26/2023 2100   UROBILINOGEN 0.2 03/03/2016 0917   NITRITE NEGATIVE 10/26/2023 2100   LEUKOCYTESUR NEGATIVE 10/26/2023 2100   Sepsis Labs: @LABRCNTIP (procalcitonin:4,lacticidven:4)  ) Recent Results (from the past 240 hours)  Resp panel by RT-PCR (RSV, Flu A&B, Covid) Anterior Nasal Swab     Status: None   Collection Time: 10/26/23  8:05 PM   Specimen: Anterior Nasal Swab  Result Value Ref Range Status   SARS Coronavirus 2 by RT PCR NEGATIVE NEGATIVE Final   Influenza A by PCR NEGATIVE NEGATIVE Final   Influenza B by PCR NEGATIVE NEGATIVE  Final    Comment: (NOTE) The Xpert Xpress SARS-CoV-2/FLU/RSV plus assay is intended as an aid in the diagnosis of influenza from Nasopharyngeal swab specimens and should not be used as a sole basis for treatment. Nasal washings and aspirates are unacceptable for Xpert Xpress SARS-CoV-2/FLU/RSV testing.  Fact Sheet for Patients: BloggerCourse.com  Fact Sheet for Healthcare Providers: SeriousBroker.it  This test is not yet approved or cleared by the [place]  FDA and has been authorized for detection and/or diagnosis of SARS-CoV-2 by FDA under an Emergency Use Authorization (EUA). This EUA will remain in effect (meaning this test can be used) for the duration of the COVID-19 declaration under Section 564(b)(1) of the Act, 21 U.S.C. section 360bbb-3(b)(1), unless the authorization is terminated or revoked.     Resp Syncytial Virus by PCR NEGATIVE NEGATIVE Final    Comment: (NOTE) Fact Sheet for  Patients: BloggerCourse.com  Fact Sheet for Healthcare Providers: SeriousBroker.it  This test is not yet approved or cleared by the [place]  FDA and has been authorized for detection and/or diagnosis of SARS-CoV-2 by FDA under an Emergency Use Authorization (EUA). This EUA will remain in effect (meaning this test can be used) for the duration of the COVID-19 declaration under Section 564(b)(1) of the Act, 21 U.S.C. section 360bbb-3(b)(1), unless the authorization is terminated or revoked.  Performed at [facility], [address] [place], [place] [address]   Blood culture (routine x 2)     Status: None (Preliminary result)   Collection Time: 10/26/23  8:07 PM   Specimen: BLOOD LEFT ARM  Result Value Ref Range Status   Specimen Description BLOOD LEFT ARM  Final   Special Requests   Final    BOTTLES DRAWN AEROBIC AND ANAEROBIC Blood Culture adequate volume   Culture    Final    NO GROWTH < 12 HOURS Performed at [facility], [address] [place], [place] [address]    Report Status PENDING  Incomplete  Blood culture (routine x 2)     Status: None (Preliminary result)   Collection Time: 10/26/23  9:00 PM   Specimen: BLOOD  Result Value Ref Range Status   Specimen Description BLOOD BLOOD RIGHT ARM  Final   Special Requests   Final    BOTTLES DRAWN AEROBIC AND ANAEROBIC Blood Culture adequate volume   Culture   Final    NO GROWTH < 12 HOURS Performed at [facility], [address] [place], [place] [address]    Report Status PENDING  Incomplete  Respiratory (~20 pathogens) panel by PCR     Status: None   Collection Time: 10/27/23  2:13 AM   Specimen: Nasopharyngeal Swab; Respiratory  Result Value Ref Range Status   Adenovirus NOT DETECTED NOT DETECTED Final   Coronavirus 229E NOT DETECTED NOT DETECTED Final    Comment: (NOTE) The Coronavirus on the Respiratory Panel, DOES NOT test for the novel  Coronavirus (2019 nCoV)    Coronavirus HKU1 NOT DETECTED NOT DETECTED Final   Coronavirus NL63 NOT DETECTED NOT DETECTED Final   Coronavirus OC43 NOT DETECTED NOT DETECTED Final   Metapneumovirus NOT DETECTED NOT DETECTED Final   Rhinovirus / Enterovirus NOT DETECTED NOT DETECTED Final   Influenza A NOT DETECTED NOT DETECTED Final   Influenza B NOT DETECTED NOT DETECTED Final   Parainfluenza Virus 1 NOT DETECTED NOT DETECTED Final   Parainfluenza Virus 2 NOT DETECTED NOT DETECTED Final   Parainfluenza Virus 3 NOT DETECTED NOT DETECTED Final   Parainfluenza Virus 4 NOT DETECTED NOT DETECTED Final   Respiratory Syncytial Virus NOT DETECTED NOT DETECTED Final   Bordetella pertussis NOT DETECTED NOT DETECTED Final   Bordetella Parapertussis NOT DETECTED NOT DETECTED Final   Chlamydophila pneumoniae NOT DETECTED NOT DETECTED Final   Mycoplasma pneumoniae NOT DETECTED NOT DETECTED Final    Comment: Performed at [facility], [address], [place], [place] [address]     Radiology Studies: DG Chest 2 View Result Date: 10/26/2023 CLINICAL DATA:  141880 SOB (shortness of breath) [id number] EXAM: CHEST - 2 VIEW COMPARISON:  Chest x-ray 12/17/2022, CT chest 10/29/2016 FINDINGS: The heart and mediastinal contours are unchanged. Atherosclerotic plaque. Prominent hilar vasculature. Coronary artery stent. No focal consolidation. Increased interstitial markings. No pleural effusion. No pneumothorax. No acute osseous abnormality.  Intact sternotomy wires. IMPRESSION: Cardiomegaly with pulmonary edema.  Electronically Signed   By: Morgane  Naveau M.D.   On: 10/26/2023 20:43     Scheduled Meds:  apixaban   2.5 mg Oral BID   cefUROXime   250 mg Oral BID WC   escitalopram   2.5 mg Oral QPM   insulin  aspart  0-5 Units Subcutaneous QHS   insulin  aspart  0-6 Units Subcutaneous TID WC   isosorbide  mononitrate  30 mg Oral Daily   metoprolol  tartrate  25 mg Oral BID   polycarbophil  625 mg Oral Daily   saccharomyces boulardii  250 mg Oral BID   sodium chloride  flush  3 mL Intravenous Q12H   traZODone   50 mg Oral QHS   Continuous Infusions:     LOS: 2 days    Time spent:    Sigurd Pac, MD [facility]   10/28/2023, 12:29 PM

## 2023-10-28 NOTE — Evaluation (Signed)
 Physical Therapy Brief Evaluation and Discharge Note Patient Details Name: Cody Thomas MRN: [id number] DOB: 03/22/1930 Today's Date: 10/28/2023   History of Present Illness  Pt is 89 yo presenting to [facility] on 8/26 due to weakness, shortness of breathe, nausea and swelling. Chest x-ray with findings of cardiomegaly and pulmonary edema. PMH: CAD s/p CABG, s/p DES LAD, permanent A-fib on Eliquis , DM2, GERD, gout, HLD, HTN, OSA on CPAP, CKD stage IIIb, cancer  Clinical Impression  Pt is presenting slightly below baseline level of functioning.  Currently pt is Mod I for sit to stand and gait with O2 at 3 LPM. Pt has assistance from family at discharge. Due to pt current functional status, home set up and available assistance at home recommending skilled physical therapy services 3x/week in order to address strength, balance and functional mobility to decrease risk for falls, injury and re-hospitalization. Will discharge from acute care physical therapy services at this time and encourage mobility with mobility specialist. Please re-consult if further needs arise.       PT Assessment All further PT needs can be met in the next venue of care  Assistance Needed at Discharge  PRN    Equipment Recommendations None recommended by PT     Precautions/Restrictions Precautions Precautions: Fall Recall of Precautions/Restrictions: Intact Restrictions Weight Bearing Restrictions Per Provider Order: No        Mobility  Bed Mobility       General bed mobility comments: Pt in chair and up in recliner on arrival and departure.  Transfers Overall transfer level: Modified independent Equipment used: Straight cane               General transfer comment: No assistance to get to standing.    Ambulation/Gait Ambulation/Gait assistance: Modified independent (Device/Increase time) Gait Distance (Feet): 300 Feet Assistive device: Straight cane Gait Pattern/deviations: Step-through  pattern, Antalgic Gait Speed: Below normal General Gait Details: slightly antalgic due to old knee injury per patient.  Home Activity Instructions Home Activity Instructions: Keep moving.  Stairs Stairs:  (demonstrates good strength with sit to stand and gait to perform stairs per home set up)          Modified Rankin (Stroke Patients Only)        Balance Overall balance assessment: Modified Independent, Mild deficits observed, not formally tested Sitting-balance support: Feet supported, No upper extremity supported Sitting balance-Leahy Scale: Good     Standing balance support: Reliant on assistive device for balance, Single extremity supported, During functional activity Standing balance-Leahy Scale: Fair Standing balance comment: no overt LOB          Pertinent Vitals/Pain   Pain Assessment Pain Assessment: No/denies pain     Home Living Family/patient expects to be discharged to:: Private residence Living Arrangements: Other relatives (Great neice) Available Help at Discharge: Family;Available 24 hours/day Home Environment: Stairs to enter;Rail - right  Stairs-Number of Steps: 1 Home Equipment: Rollator (4 wheels);Other (comment);Cane - quad;Cane - single point (adjustable bed)   Additional Comments: 1 L O2 at baseline that he wears sometimes during the day and continuously at night.    Prior Function Level of Independence: Needs assistance Comments: Occasional assistance with gait from great neice. Ind with bathing, dressing and toileting. Mows the yard and walks around his house    UE/LE Assessment   UE ROM/Strength/Tone/Coordination: [facility]    LE ROM/Strength/Tone/Coordination: [facility]      Communication   Communication Communication: Impaired Factors Affecting Communication: Hearing impaired  Cognition Overall Cognitive Status: Appears within functional limits for tasks assessed/performed       General Comments General comments (skin  integrity, edema, etc.): Daughter present during session. Difficult to get O2 sats reading with good pleth line continuously in the mid to upper 90's. HR stable throughout in the 80's.        Assessment/Plan    PT Problem List Decreased balance;Decreased activity tolerance;Cardiopulmonary status limiting activity       PT Visit Diagnosis Unsteadiness on feet (R26.81);Other abnormalities of gait and mobility (R26.89)      AMPAC 6 Clicks Help needed turning from your back to your side while in a flat bed without using bedrails?: None Help needed moving from lying on your back to sitting on the side of a flat bed without using bedrails?: None Help needed moving to and from a bed to a chair (including a wheelchair)?: None Help needed standing up from a chair using your arms (e.g., wheelchair or bedside chair)?: None Help needed to walk in hospital room?: None Help needed climbing 3-5 steps with a railing? : A Little 6 Click Score: 23      End of Session Equipment Utilized During Treatment: Gait belt;Oxygen Activity Tolerance: Patient tolerated treatment well Patient left: in chair;with call bell/phone within reach;with chair alarm set;with family/visitor present Nurse Communication: Mobility status PT Visit Diagnosis: Unsteadiness on feet (R26.81);Other abnormalities of gait and mobility (R26.89)     Time: 8382-8342 PT Time Calculation (min) (ACUTE ONLY): 40 min  Charges:   PT Evaluation $PT Eval Low Complexity: 1 Low PT Treatments $Therapeutic Activity: 23-37 mins    Dorothyann Maier, DPT, CLT  Acute Rehabilitation Services Office: [phone] (Secure chat preferred)   Dorothyann VEAR Maier  10/28/2023, 5:08 PM

## 2023-10-28 NOTE — Plan of Care (Signed)

## 2023-10-28 NOTE — Progress Notes (Signed)
   10/28/23 2006  BiPAP/CPAP/SIPAP  BiPAP/CPAP/SIPAP Pt Type Adult  Reason BIPAP/CPAP not in use Non-compliant  BiPAP/CPAP /SiPAP Vitals  Pulse Rate (!) 53  Resp 20  SpO2 92 %  Bilateral Breath Sounds Clear;Diminished  MEWS Score/Color  MEWS Score 0  MEWS Score Color Landy

## 2023-10-29 ENCOUNTER — Encounter: Payer: Self-pay | Admitting: Internal Medicine

## 2023-10-29 DIAGNOSIS — I5023 Acute on chronic systolic (congestive) heart failure: Secondary | ICD-10-CM | POA: Diagnosis not present

## 2023-10-29 LAB — BASIC METABOLIC PANEL WITH GFR
Anion gap: 9 (ref 5–15)
BUN: 40 mg/dL — ABNORMAL HIGH (ref 8–23)
CO2: 30 mmol/L (ref 22–32)
Calcium: 8.6 mg/dL — ABNORMAL LOW (ref 8.9–10.3)
Chloride: 97 mmol/L — ABNORMAL LOW (ref 98–111)
Creatinine, Ser: 1.75 mg/dL — ABNORMAL HIGH (ref 0.61–1.24)
GFR, Estimated: 36 mL/min — ABNORMAL LOW (ref >60)
Glucose, Bld: 115 mg/dL — ABNORMAL HIGH (ref 70–99)
Potassium: 3.5 mmol/L (ref 3.5–5.1)
Sodium: 136 mmol/L (ref 135–145)

## 2023-10-29 LAB — CBC
HCT: 36.3 % — ABNORMAL LOW (ref 39.0–52.0)
Hemoglobin: 12.3 g/dL — ABNORMAL LOW (ref 13.0–17.0)
MCH: 33.3 pg (ref 26.0–34.0)
MCHC: 33.9 g/dL (ref 30.0–36.0)
MCV: 98.4 fL (ref 80.0–100.0)
Platelets: 99 K/uL — ABNORMAL LOW (ref 150–400)
RBC: 3.69 MIL/uL — ABNORMAL LOW (ref 4.22–5.81)
RDW: 13.7 % (ref 11.5–15.5)
WBC: 4.6 K/uL (ref 4.0–10.5)
nRBC: 0 % (ref 0.0–0.2)

## 2023-10-29 LAB — GLUCOSE, CAPILLARY: Glucose-Capillary: 98 mg/dL (ref 70–99)

## 2023-10-29 MED ORDER — METOPROLOL TARTRATE 12.5 MG HALF TABLET
12.5000 mg | ORAL_TABLET | Freq: Two times a day (BID) | ORAL | Status: DC
  Administered 2023-10-29: 12.5 mg via ORAL

## 2023-10-29 MED ORDER — ISOSORBIDE MONONITRATE ER 30 MG PO TB24: 15.0000 mg | ORAL_TABLET | Freq: Every day | ORAL | Status: DC

## 2023-10-29 MED ORDER — FUROSEMIDE 40 MG PO TABS: ORAL_TABLET | ORAL | Status: DC

## 2023-10-29 MED ORDER — CALCIUM POLYCARBOPHIL 625 MG PO TABS: 625.0000 mg | ORAL_TABLET | Freq: Every day | ORAL | 0 refills | Status: AC

## 2023-10-29 MED ORDER — METOPROLOL TARTRATE 25 MG PO TABS
12.5000 mg | ORAL_TABLET | Freq: Two times a day (BID) | ORAL | Status: AC
Start: 2023-10-29 — End: ?

## 2023-10-29 MED ORDER — CEFUROXIME AXETIL 250 MG PO TABS: 250.0000 mg | ORAL_TABLET | Freq: Two times a day (BID) | ORAL | 0 refills | Status: AC

## 2023-10-29 NOTE — Evaluation (Signed)
 Occupational Therapy Evaluation Patient Details Name: Cody Thomas MRN: [id number] DOB: [birth year] Today's Date: 10/29/2023   History of Present Illness   Pt is 89 yo presenting to [facility] on 8/26 due to weakness, shortness of breathe, nausea and swelling. Chest x-ray with findings of cardiomegaly and pulmonary edema. PMH: CAD s/p CABG, s/p DES LAD, permanent A-fib on Eliquis , DM2, GERD, gout, HLD, HTN, OSA on CPAP, CKD stage IIIb, cancer     Clinical Impressions Pt admitted based on above, and was seen based on problem list below. PTA pt was mostly mod I with ADLs but reporting family is available to assist prn. Today pt is at his functional baseline for ADLs. Able to complete toileting and standing ADLs with VSS on 2L O2. Pt reporting daughter manages meds and granddaughter lives with him and is able to assist as needed. No follow up OT needs, DME below. No further acute OT needs, OT is signing off on this pt.     If plan is discharge home, recommend the following:   Assistance with cooking/housework;Direct supervision/assist for medications management     Functional Status Assessment   Patient has not had a recent decline in their functional status     Equipment Recommendations   BSC/3in1;Tub/shower seat      Precautions/Restrictions   Precautions Precautions: Fall Recall of Precautions/Restrictions: Intact Restrictions Weight Bearing Restrictions Per Provider Order: No     Mobility Bed Mobility   General bed mobility comments: Received EOB    Transfers Overall transfer level: Modified independent Equipment used: Straight cane       General transfer comment: Mod I increased time      Balance Overall balance assessment: Mild deficits observed, not formally tested         ADL either performed or assessed with clinical judgement   ADL Overall ADL's : Modified independent;At baseline       General ADL Comments: Pt at baseline mod I, completed  toileting and standing grooming tasks     Vision Baseline Vision/History: 0 No visual deficits Vision Assessment?: No apparent visual deficits            Pertinent Vitals/Pain Pain Assessment Pain Assessment: No/denies pain     Extremity/Trunk Assessment Upper Extremity Assessment Upper Extremity Assessment: Overall WFL for tasks assessed   Lower Extremity Assessment Lower Extremity Assessment: Overall WFL for tasks assessed   Cervical / Trunk Assessment Cervical / Trunk Assessment: Normal   Communication Communication Communication: Impaired Factors Affecting Communication: Hearing impaired   Cognition Arousal: Alert Behavior During Therapy: WFL for tasks assessed/performed Cognition: No apparent impairments     OT - Cognition Comments: Mild stm deficits at baseline   Following commands: Intact       Cueing  General Comments   Cueing Techniques: Verbal cues  VSS on 2L           Home Living Family/patient expects to be discharged to:: Private residence Living Arrangements: Other relatives Available Help at Discharge: Family;Available PRN/intermittently Type of Home: House Home Access: Stairs to enter [employer] of Steps: 3 Entrance Stairs-Rails: Right Home Layout: One level     Bathroom Shower/Tub: Chief Strategy Officer: Standard     Home Equipment: Rollator (4 wheels);Other (comment);Cane - quad;Cane - single point (adustable bed)   Additional Comments: 1 L O2 at baseline that he wears sometimes during the day and continuously at night.      Prior Functioning/Environment Prior Level of Function : Independent/Modified Independent  Mobility Comments: mod I with SPC ADLs Comments: mod I, daughter manages meds    OT Problem List: Cardiopulmonary status limiting activity        OT Goals(Current goals can be found in the care plan section)   Acute Rehab OT Goals Patient Stated Goal: To go home OT Goal  Formulation: All assessment and education complete, DC therapy Time For Goal Achievement: 11/12/23 Potential to Achieve Goals: Good   AM-PAC OT 6 Clicks Daily Activity     Outcome Measure Help from another person eating meals?: None Help from another person taking care of personal grooming?: None Help from another person toileting, which includes using toliet, bedpan, or urinal?: None Help from another person bathing (including washing, rinsing, drying)?: None Help from another person to put on and taking off regular upper body clothing?: None Help from another person to put on and taking off regular lower body clothing?: None 6 Click Score: 24   End of Session Equipment Utilized During Treatment: Other (comment) [facility]) Nurse Communication: Mobility status  Activity Tolerance: Patient tolerated treatment well Patient left: in chair;with call bell/phone within reach;with chair alarm set  OT Visit Diagnosis: Unsteadiness on feet (R26.81);Other abnormalities of gait and mobility (R26.89)                Time: 9172-9140 OT Time Calculation (min): 32 min Charges:  OT General Charges $OT Visit: 1 Visit OT Evaluation $OT Eval Moderate Complexity: 1 Mod OT Treatments $Self Care/Home Management : 8-22 mins  Adrianne BROCKS, OT  Acute Rehabilitation Services Office [phone] Secure chat preferred   Adrianne GORMAN Savers 10/29/2023, 10:38 AM

## 2023-10-29 NOTE — Plan of Care (Signed)

## 2023-10-29 NOTE — Progress Notes (Addendum)
 DC order noted per MD. DC RN at bedside. Discussed DME needs with CM to coordinate BSC/shower chair. AVS printed/reviewed with patient. Dtr Tammy to bring O2 and son from [place] to transport patient home on arrival around 2pm. PIV left in to await family arrival. CP/Edu resolved.Tele not present on the patient. Tele cords returned to front desk. Primary RN instructed to pick up home meds prior to discharge.

## 2023-10-29 NOTE — TOC Transition Note (Incomplete)
 Transition of Care [facility]) - Discharge Note   Patient Details  Name: Cody Thomas MRN: [id number] Date of Birth: [birth year]  [facility] CM/SW Contact:  Rosalva Jon Bloch, RN Phone Number: 10/29/2023, 1:28 PM   Clinical Narrative:    Patient will DC to: Anticipated DC date: Family notified: Transport by:   Per MD patient ready for DC to . RN, patient, patient's family, and facility notified of DC. Discharge Summary and FL2 sent to facility. RN to call report prior to discharge (). DC packet on chart. Ambulance transport requested for patient.   RNCM will sign off for now as intervention is no longer needed. Please consult us  again if new needs arise.    Final next level of care: Home w Home Health Services Barriers to Discharge: No Barriers Identified   Patient Goals and CMS Choice            Discharge Placement                       Discharge Plan and Services Additional resources added to the After Visit Summary for                            [facility] Arranged: PT HH Agency: [facility] Date [facility] Agency Contacted: 10/29/23 Time HH Agency Contacted: 1328 Representative spoke with at [facility] Agency: [place]  Social Drivers of Health (SDOH) Interventions SDOH Screenings   Food Insecurity: No Food Insecurity (10/27/2023)  Housing: Unknown (10/27/2023)  Transportation Needs: No Transportation Needs (10/27/2023)  Utilities: Not At Risk (10/27/2023)  Alcohol Screen: Low Risk  (07/13/2022)  Financial Resource Strain: Low Risk  (07/13/2022)  Social Connections: Unknown (10/27/2023)  Tobacco Use: Medium Risk (10/26/2023)     Readmission Risk Interventions    07/14/2022   12:14 PM  Readmission Risk Prevention Plan  Transportation Screening Complete  PCP or Specialist Appt within 5-7 Days Complete  Home Care Screening Complete  Medication Review (RN CM) Referral to Pharmacy

## 2023-10-29 NOTE — Progress Notes (Signed)
 Heart Failure Navigator Progress Note  Assessed for Heart & Vascular TOC clinic readiness.  Per Dr. Selwyn TOC needed at this time. Patient will follow-up with [facility] after discharge.    Navigator will sign off at this time.  Charmaine Pines, RN, BSN [facility] Heart Failure Navigator Secure Chat Only

## 2023-10-29 NOTE — Progress Notes (Signed)
 Speech Language Pathology Treatment: Dysphagia  Patient Details Name: Cody Thomas MRN: [id number] DOB: 03-Jun-1930 Today's Date: 10/29/2023 Time: 8964-8956 SLP Time Calculation (min) (ACUTE ONLY): 8 min  Assessment / Plan / Recommendation Clinical Impression  Pt seen for follow up after swallow assessment. He does not report coughing with po's but states pills are hard to swallow with liquid sometimes and intermittently he feels food/liquid in esophagus and slower to transit. He exhibited no s/s with straw sips water and mastication of regular was timely and complete. Pt may be discharging home today. Educated him to try placing pill whole in applesauce, puree or pudding if difficulty with liquid. Also reviewed general esophageal strategies including sit upright min 30 min after meals and drink liquids during meals. Pt verbalized understanding. Continue regular texture, thin liquids and strategies aformentioned. No further ST needed.    HPI HPI: Cody Thomas is a 89 y.o. male with medical history significant for hypertension, CAD, HFmrEF, atrial fibrillation on Eliquis , CKD 3B, depression, and anxiety, OSA, and chronic hypoxic respiratory failure who presents with increased shortness of breath, weakness, leg swelling, nausea, and diarrhea.     Patient's shortness of breath, leg swelling, and oxygen saturations have all worsened over the past 2 weeks per report of family.  Patient also complains of progressive fatigue over this interval and has more recently developed nausea without vomiting.  He was having loose stools yesterday which he states improved after Pepto-Bismol.  There is no abdominal pain associated with this and he denies chest pain.  He had not been experiencing any subjective fever or chills.      SLP Plan  All goals met;Discharge SLP treatment due to (comment)          Recommendations  Diet recommendations: Regular;Thin liquid Liquids provided via:  Cup;Straw Medication Administration: Other (Comment) (if difficulty with liquid, educated pt to place pill in applesauce) Supervision: Patient able to self feed Compensations: Slow rate;Small sips/bites Postural Changes and/or Swallow Maneuvers: Seated upright 90 degrees;Upright 30-60 min after meal                  Oral care BID   None Dysphagia, unspecified (R13.10)     All goals met;Discharge SLP treatment due to (comment)     Dustin Olam Bull  10/29/2023, 11:08 AM

## 2023-10-29 NOTE — Progress Notes (Signed)
 Mobility Specialist Progress Note:   10/29/23 0940  Mobility  Activity Ambulated independently  Level of Assistance Modified independent, requires aide device or extra time  Assistive Device Cane  Distance Ambulated (ft) 200 ft  Activity Response Tolerated well  Mobility Referral Yes  Mobility visit 1 Mobility  Mobility Specialist Start Time (ACUTE ONLY) 0940  Mobility Specialist Stop Time (ACUTE ONLY) 0950  Mobility Specialist Time Calculation (min) (ACUTE ONLY) 10 min   Pt agreeable to mobility session. Required no physical assistance throughout ambulation with cane. VSS on 2LO2, which is baseline. Pt back in chair with all needs met.   Therisa Rana Mobility Specialist Please contact via SecureChat or  Rehab office at [phone]

## 2023-10-29 NOTE — Discharge Summary (Signed)
 Physician Discharge Summary  Cody Thomas FMW:[id number] DOB: 20-Jul-1930 DOA: 10/26/2023  PCP: Aisha Harvey, MD  Admit date: 10/26/2023 Discharge date: 10/29/2023  Time spent: 45 minutes  Recommendations for Outpatient Follow-up:  PCP in 1 week, please check BMP at follow-up   Discharge Diagnoses:  Principal Problem:   Acute on chronic heart failure with mildly reduced ejection fraction (HFmrEF, 41-49%) (HCC) Active Problems:   CAD (coronary artery disease)   Type 2 diabetes mellitus without complication, without long-term current use of insulin  (HCC)   OSA (obstructive sleep apnea)   CKD (chronic kidney disease) stage 3, GFR 30-59 ml/min   PAF (paroxysmal atrial fibrillation) (HCC)   Recurrent major depression (HCC)   Generalized anxiety disorder   Acute on chronic respiratory failure with hypoxia (HCC)   SIRS (systemic inflammatory response syndrome) (HCC)   Hypokalemia   Thrombocytopenia (HCC)   Elevated troponin   Discharge Condition: Improved diet  Diet recommendation: Diet, heart healthy  Filed Weights   10/26/23 1951  Weight: 69 kg    History of present illness:  89/M with history of chronic combined CHF, paroxysmal A-fib on Eliquis , CKD 3B, hypertension, depression, anxiety, OSA, chronic respiratory failure on 2 L home O2 presented to the ED with weakness, worsening shortness of breath, nausea and swelling In the ER he was noted to be febrile to 101, more hypoxic than baseline placed on 4 L O2, labs noted sodium 128, potassium 3.3, creatinine 1.9, lactate 2.9, troponin 145, BNP 2349, chest x-ray noted cardiomegaly and pulmonary edema - Admitted, started on broad-spectrum antibiotics and diuretics  Hospital Course:   Acute on chronic HFmrEF; moderate mitral regurgitation Acute on chronic hypoxic respiratory failure  - EF was 45-50% with global hypokinesis, moderate MR, moderate TR, and increased PA pressures on TTE from August 2024  - Improving with  diuresis, now euvolemic transitioned back to oral Lasix  40 mg in a.m. and 20 mg in p.m. -Continue Toprol  and Imdur  - GDMT limited by AKI/CKD -Follow-up with PCP next week   Fever/SIRS Suspected bronchitis - Febrile and tachypneic with leukocytosis on presentation, - Suspected bronchitis, respiratory virus panel is negative,  switch to oral cefuroxime  to complete 5-day course   Elevated troponin  - Patient denies chest discomfort, troponin is elevated but flat, and this likely reflects demand ischemia in setting of acute CHF and hypoxia  - Continue Toprol  and Imdur    Permanent atrial fibrillation   - Rate controlled, continue Eliquis , metoprolol     CKD 3B  -Baseline creatinine appears to be 1.6-2 - Renally-dose medications, monitor      Thrombocytopenia  - Appears chronic, there is no bleeding    Type II DM  - A2c was 6.2% in June 2025  - Diet controlled    Depression, anxiety  - Lexapro , trazodone      OSA  - CPAP while sleeping     Hyponatremia  - Monitor closely while diuresing  Discharge Exam: Vitals:   10/29/23 0900 10/29/23 0923  BP: 112/63 (!) 98/59  Pulse: 66 62  Resp:  20  Temp:  (!) 97.4 F (36.3 C)  SpO2:  92%   Gen: Awake, Alert, Oriented X 3,  HEENT: no JVD Lungs: Proving air movement, few scattered rhonchi CVS: S1S2/irregular Abd: soft, Non tender, non distended, BS present Extremities: No edema Skin: no new rashes on exposed skin   Discharge Instructions   Discharge Instructions     Diet - low sodium heart healthy   Complete by: As directed  Increase activity slowly   Complete by: As directed       Allergies as of 10/29/2023       Reactions   Crestor [rosuvastatin] Other (See Comments)   Nightmares    Sertraline Diarrhea   Zocor [simvastatin] Other (See Comments)   Nightmares         Medication List     STOP taking these medications    hydrALAZINE  25 MG tablet Commonly known as: APRESOLINE        TAKE these  medications    acetaminophen  650 MG CR tablet Commonly known as: TYLENOL  Take 650 mg by mouth every 8 (eight) hours as needed for pain.   apixaban  2.5 MG Tabs tablet Commonly known as: Eliquis  Take 1 tablet (2.5 mg total) by mouth 2 (two) times daily.   Blood Glucose Test Strips 333 Strp 1 strip by In Vitro route 3 (three) times daily.   cefUROXime  250 MG tablet Commonly known as: CEFTIN  Take 1 tablet (250 mg total) by mouth 2 (two) times daily with a meal for 3 days.   escitalopram  5 MG tablet Commonly known as: LEXAPRO  Take 2.5 mg by mouth every evening.   fluticasone  50 MCG/ACT nasal spray Commonly known as: FLONASE  Place 1 spray into both nostrils daily.   furosemide  40 MG tablet Commonly known as: LASIX  Take 1 tablet (40 mg total) by mouth every morning AND 0.5 tablets (20 mg total) every evening.   isosorbide  mononitrate 30 MG 24 hr tablet Commonly known as: IMDUR  Take 0.5 tablets (15 mg total) by mouth daily. What changed: how much to take   metoprolol  tartrate 25 MG tablet Commonly known as: LOPRESSOR  Take 0.5 tablets (12.5 mg total) by mouth 2 (two) times daily. What changed: how much to take   polycarbophil 625 MG tablet Commonly known as: FIBERCON Take 1 tablet (625 mg total) by mouth daily for 5 days. Start taking on: October 30, 2023   potassium chloride  SA 20 MEQ tablet Commonly known as: Klor-Con  M20 Take 1 tablet (20 mEq total) by mouth daily.   traZODone  50 MG tablet Commonly known as: DESYREL  Take 50 mg by mouth at bedtime.       Allergies  Allergen Reactions   Crestor [Rosuvastatin] Other (See Comments)    Nightmares    Sertraline Diarrhea   Zocor [Simvastatin] Other (See Comments)    Nightmares       The results of significant diagnostics from this hospitalization (including imaging, microbiology, ancillary and laboratory) are listed below for reference.    Significant Diagnostic Studies: DG Chest 2 View Result Date:  10/26/2023 CLINICAL DATA:  141880 SOB (shortness of breath) 141880 EXAM: CHEST - 2 VIEW COMPARISON:  Chest x-ray 12/17/2022, CT chest 10/29/2016 FINDINGS: The heart and mediastinal contours are unchanged. Atherosclerotic plaque. Prominent hilar vasculature. Coronary artery stent. No focal consolidation. Increased interstitial markings. No pleural effusion. No pneumothorax. No acute osseous abnormality.  Intact sternotomy wires. IMPRESSION: Cardiomegaly with pulmonary edema. Electronically Signed   By: Morgane  Naveau M.D.   On: 10/26/2023 20:43    Microbiology: Recent Results (from the past 240 hours)  Resp panel by RT-PCR (RSV, Flu A&B, Covid) Anterior Nasal Swab     Status: None   Collection Time: 10/26/23  8:05 PM   Specimen: Anterior Nasal Swab  Result Value Ref Range Status   SARS Coronavirus 2 by RT PCR NEGATIVE NEGATIVE Final   Influenza A by PCR NEGATIVE NEGATIVE Final   Influenza B by PCR NEGATIVE NEGATIVE  Final    Comment: (NOTE) The Xpert Xpress SARS-CoV-2/FLU/RSV plus assay is intended as an aid in the diagnosis of influenza from Nasopharyngeal swab specimens and should not be used as a sole basis for treatment. Nasal washings and aspirates are unacceptable for Xpert Xpress SARS-CoV-2/FLU/RSV testing.  Fact Sheet for Patients: BloggerCourse.com  Fact Sheet for Healthcare Providers: SeriousBroker.it  This test is not yet approved or cleared by the [place]  FDA and has been authorized for detection and/or diagnosis of SARS-CoV-2 by FDA under an Emergency Use Authorization (EUA). This EUA will remain in effect (meaning this test can be used) for the duration of the COVID-19 declaration under Section 564(b)(1) of the Act, 21 U.S.C. section 360bbb-3(b)(1), unless the authorization is terminated or revoked.     Resp Syncytial Virus by PCR NEGATIVE NEGATIVE Final    Comment: (NOTE) Fact Sheet for  Patients: BloggerCourse.com  Fact Sheet for Healthcare Providers: SeriousBroker.it  This test is not yet approved or cleared by the [place]  FDA and has been authorized for detection and/or diagnosis of SARS-CoV-2 by FDA under an Emergency Use Authorization (EUA). This EUA will remain in effect (meaning this test can be used) for the duration of the COVID-19 declaration under Section 564(b)(1) of the Act, 21 U.S.C. section 360bbb-3(b)(1), unless the authorization is terminated or revoked.  Performed at [facility], [address], [place], [place] [address]   Blood culture (routine x 2)     Status: None (Preliminary result)   Collection Time: 10/26/23  8:07 PM   Specimen: BLOOD LEFT ARM  Result Value Ref Range Status   Specimen Description BLOOD LEFT ARM  Final   Special Requests   Final    BOTTLES DRAWN AEROBIC AND ANAEROBIC Blood Culture adequate volume   Culture   Final    NO GROWTH 3 DAYS Performed at [facility], [address] [place], [place] [address]    Report Status PENDING  Incomplete  Blood culture (routine x 2)     Status: None (Preliminary result)   Collection Time: 10/26/23  9:00 PM   Specimen: BLOOD  Result Value Ref Range Status   Specimen Description BLOOD BLOOD RIGHT ARM  Final   Special Requests   Final    BOTTLES DRAWN AEROBIC AND ANAEROBIC Blood Culture adequate volume   Culture   Final    NO GROWTH 3 DAYS Performed at [facility], [address], [place], [place] [address]    Report Status PENDING  Incomplete  Respiratory (~20 pathogens) panel by PCR     Status: None   Collection Time: 10/27/23  2:13 AM   Specimen: Nasopharyngeal Swab; Respiratory  Result Value Ref Range Status   Adenovirus NOT DETECTED NOT DETECTED Final   Coronavirus 229E NOT DETECTED NOT DETECTED Final    Comment: (NOTE) The Coronavirus on the Respiratory Panel, DOES NOT test for the novel   Coronavirus (2019 nCoV)    Coronavirus HKU1 NOT DETECTED NOT DETECTED Final   Coronavirus NL63 NOT DETECTED NOT DETECTED Final   Coronavirus OC43 NOT DETECTED NOT DETECTED Final   Metapneumovirus NOT DETECTED NOT DETECTED Final   Rhinovirus / Enterovirus NOT DETECTED NOT DETECTED Final   Influenza A NOT DETECTED NOT DETECTED Final   Influenza B NOT DETECTED NOT DETECTED Final   Parainfluenza Virus 1 NOT DETECTED NOT DETECTED Final   Parainfluenza Virus 2 NOT DETECTED NOT DETECTED Final   Parainfluenza Virus 3 NOT DETECTED NOT DETECTED Final  Parainfluenza Virus 4 NOT DETECTED NOT DETECTED Final   Respiratory Syncytial Virus NOT DETECTED NOT DETECTED Final   Bordetella pertussis NOT DETECTED NOT DETECTED Final   Bordetella Parapertussis NOT DETECTED NOT DETECTED Final   Chlamydophila pneumoniae NOT DETECTED NOT DETECTED Final   Mycoplasma pneumoniae NOT DETECTED NOT DETECTED Final    Comment: Performed at [facility], [address], [place], [place] [address]     Labs: Basic Metabolic Panel: Recent Labs  Lab 10/26/23 2007 10/27/23 9366 10/28/23 0350 10/29/23 0345  NA 128* 130* 136 136  K 3.3* 2.8* 3.9 3.5  CL 90* 93* 97* 97*  CO2 24 28 28 30   GLUCOSE 120* 94 92 115*  BUN 28* 28* 35* 40*  CREATININE 1.91* 1.77* 1.75* 1.75*  CALCIUM  8.8* 8.4* 8.9 8.6*  MG  --  1.7  --   --    Liver Function Tests: Recent Labs  Lab 10/26/23 2007  AST 41  ALT 20  ALKPHOS 53  BILITOT 2.1*  PROT 6.9  ALBUMIN  3.6   No results for input(s): LIPASE, AMYLASE in the last 168 hours. No results for input(s): AMMONIA in the last 168 hours. CBC: Recent Labs  Lab 10/26/23 2007 10/27/23 9366 10/28/23 0350 10/29/23 0345  WBC 12.1* 7.9 4.9 4.6  NEUTROABS 10.6*  --   --   --   HGB 13.8 11.7* 12.9* 12.3*  HCT 40.7 33.5* 37.3* 36.3*  MCV 100.0 96.5 96.9 98.4  PLT 101* 87* 96* 99*   Cardiac Enzymes: No results for input(s): CKTOTAL, CKMB, CKMBINDEX, TROPONINI in the  last 168 hours. BNP: BNP (last 3 results) Recent Labs    12/17/22 1521 10/26/23 2007  BNP 768.9* 2,349.2*    ProBNP (last 3 results) No results for input(s): PROBNP in the last 8760 hours.  CBG: Recent Labs  Lab 10/28/23 0549 10/28/23 1305 10/28/23 1658 10/28/23 2115 10/29/23 0613  GLUCAP 94 169* 104* 151* 98       Signed:  Sigurd Pac MD.  [facility] 10/29/2023, 10:07 AM

## 2023-10-31 LAB — CULTURE, BLOOD (ROUTINE X 2)
Culture: NO GROWTH
Culture: NO GROWTH
Special Requests: ADEQUATE
Special Requests: ADEQUATE

## 2023-11-06 ENCOUNTER — Other Ambulatory Visit: Payer: Self-pay | Admitting: Cardiology

## 2023-11-06 DIAGNOSIS — I480 Paroxysmal atrial fibrillation: Secondary | ICD-10-CM

## 2023-11-08 NOTE — Telephone Encounter (Signed)
 Prescription refill request for Eliquis  received. Indication:afib Last office visit:4/25 Scr:1.75  8/25 Age: 89 Weight:69  kg  Prescription refilled

## 2023-11-10 ENCOUNTER — Ambulatory Visit: Admitting: Podiatry

## 2023-11-11 ENCOUNTER — Other Ambulatory Visit: Payer: Self-pay

## 2023-11-11 ENCOUNTER — Telehealth: Payer: Self-pay | Admitting: Cardiology

## 2023-11-11 DIAGNOSIS — I480 Paroxysmal atrial fibrillation: Secondary | ICD-10-CM

## 2023-11-11 MED ORDER — APIXABAN 2.5 MG PO TABS: 2.5000 mg | ORAL_TABLET | Freq: Two times a day (BID) | ORAL | 1 refills | Status: AC

## 2023-11-11 NOTE — Telephone Encounter (Signed)
*  STAT* If patient is at the pharmacy, call can be transferred to refill team.   1. Which medications need to be refilled? (please list name of each medication and dose if known) ELIQUIS  2.5 MG TABS tablet   2. Which pharmacy/location (including street and city if local pharmacy) is medication to be sent to? [id number] - [place], [place] - [address]  3. Do they need a 30 day or 90 day supply?  90 day supply

## 2023-11-11 NOTE — Telephone Encounter (Signed)
 Prescription refill request for Eliquis  received. Indication:afib Last office visit:4/25 Scr:1.75  8/25 Age: 89 Weight:69  kg  Prescription refilled

## 2023-11-24 ENCOUNTER — Encounter: Payer: Self-pay | Admitting: Cardiology

## 2023-11-24 ENCOUNTER — Ambulatory Visit: Attending: Cardiology | Admitting: Cardiology

## 2023-11-24 VITALS — BP 112/50 | HR 50 | Ht 63.0 in | Wt 145.6 lb

## 2023-11-24 DIAGNOSIS — I2510 Atherosclerotic heart disease of native coronary artery without angina pectoris: Secondary | ICD-10-CM

## 2023-11-24 DIAGNOSIS — N1832 Chronic kidney disease, stage 3b: Secondary | ICD-10-CM

## 2023-11-24 DIAGNOSIS — Z7901 Long term (current) use of anticoagulants: Secondary | ICD-10-CM

## 2023-11-24 DIAGNOSIS — I4821 Permanent atrial fibrillation: Secondary | ICD-10-CM

## 2023-11-24 NOTE — Progress Notes (Signed)
 Cardiology Office Note:  .   Date:  11/24/2023  ID:  Cody Thomas, DOB Jul 03, 1930, MRN [id number] PCP: Aisha Harvey, MD  [facility] Providers Cardiologist:  Oneil Parchment, MD    History of Present Illness: .   Cody Thomas is a 89 y.o. male Discussed the use of AI scribe software   History of Present Illness Cody Thomas is a 89 year old male with coronary artery disease and atrial fibrillation who presents for follow-up.  He has a history of coronary artery disease, having undergone PCI in 2009 and coronary artery bypass grafting in August 2013. He experiences occasional sharp left-sided chest pain, which he describes as non-cardiac. His current medications include Isosorbide  30 mg daily and Metoprolol  25 mg twice daily. He is not taking aspirin  due to anticoagulation with Eliquis .  He has permanent atrial fibrillation, which is rate-controlled with Eliquis  2.5 mg twice daily and Metoprolol  25 mg twice daily. His last echocardiogram in August 2024 showed an ejection fraction of 45-50% with moderate mitral regurgitation and pulmonary hypertension with pressures of 54 mmHg. His last EKG on October 26, 2023, showed atrial fibrillation with PVCs and a heart rate of 98 bpm.  He was recently hospitalized on October 29, 2023, for a heart failure exacerbation with mildly reduced ejection fraction (41-49%). He improved with diuresis and was transitioned back to oral Lasix  40 mg in the morning and 20 mg in the evening. He reports getting up at night to urinate. During the hospitalization, he also had a fever with suspected bronchitis and was treated with oral cefuroxime .  He has stage 3B chronic kidney disease, with a prior creatinine of 1.7 and a recent creatinine of 1.75.  He has type 2 diabetes, which is well-controlled with diet, with a hemoglobin A1c of 6.2 in June 2025. He also has chronic thrombocytopenia without signs of bleeding and is monitored while on Eliquis .  He  has obstructive sleep apnea and uses CPAP while sleeping. He also has depression and anxiety, for which he takes Lexapro  and Trazodone .  He experiences night sweats, which he attributes to wrapping up at night, and he is often cold. He reports his weight as 155 lbs, down from 154 lbs after reducing his food intake.      Studies Reviewed: .        Results LABS Creatinine: 1.75 Potassium: 4.1 ALT: 9 Troponin: Mildly elevated Hemoglobin A1c: 6.2 (June 2025) Sodium: 136 Hemoglobin: 12.3  DIAGNOSTIC Echocardiogram: Ejection fraction 45-50%, moderate mitral regurgitation, pulmonary hypertension with pressures of 54 mmHg (August 2024) Ejection Fraction: 41-49% (10/29/2023) EKG: Atrial fibrillation with PVCs, heart rate 98 bpm (10/26/2023) Risk Assessment/Calculations:            Physical Exam:   VS:  BP (!) 112/50   Pulse (!) 50   Ht 5' 3 (1.6 m)   Wt 145 lb 9.6 oz (66 kg)   SpO2 90%   BMI 25.79 kg/m    Wt Readings from Last 3 Encounters:  11/24/23 145 lb 9.6 oz (66 kg)  10/26/23 152 lb 1.9 oz (69 kg)  06/08/23 150 lb 12.8 oz (68.4 kg)    GEN: Well nourished, well developed in no acute distress NECK: No JVD; No carotid bruits CARDIAC: RRR, no murmurs, no rubs, no gallops RESPIRATORY:  Clear to auscultation without rales, wheezing or rhonchi  ABDOMEN: Soft, non-tender, non-distended EXTREMITIES:  No edema; No deformity   ASSESSMENT AND PLAN: .    Assessment and Plan Assessment &  Plan Coronary artery disease status post PCI and CABG Coronary artery disease with history of PCI in 2009 and CABG in 2013. Currently well-managed on Isosorbide  and Metoprolol . Occasional sharp left-sided pain appears non-cardiac. Foregoing aspirin  due to anticoagulation with Eliquis . - Continue Isosorbide  30 mg daily - Continue Metoprolol  25 mg BID  Permanent atrial fibrillation, rate controlled on anticoagulation Permanent atrial fibrillation with rate control achieved using Metoprolol   and anticoagulation with Eliquis . Last EKG showed atrial fibrillation with PVCs, heart rate 98 bpm. CHADS-VASc score is 6. - Continue Eliquis  2.5 mg BID - Continue Metoprolol  25 mg BID  Chronic systolic heart failure with moderately reduced ejection fraction Chronic systolic heart failure with ejection fraction improved to 45-50% as of August 2024. Recent hospitalization for heart failure exacerbation with mildly reduced ejection fraction (41-49%). Improved with diuresis and transitioned back to oral Lasix . - Continue Furosemide  40 mg AM and 20 mg PM - Monitor weight and adjust Lasix  if fluid retention is noted  Moderate mitral regurgitation and pulmonary hypertension Moderate mitral regurgitation and pulmonary hypertension with pressures of 54 mmHg noted on echocardiogram in August 2024.  Stage 3b chronic kidney disease Stage 3b chronic kidney disease with creatinine 1.75. Goal-directed medical therapy limited by CKD. Kidney function is sluggish but stable. - Monitor kidney function closely  Type 2 diabetes mellitus, well controlled Type 2 diabetes well controlled with diet. Hemoglobin A1c was 6.2 in June 2025.  Chronic thrombocytopenia Chronic thrombocytopenia with no signs of bleeding. Monitoring while on Eliquis . - Continue monitoring for signs of bleeding  Hyponatremia Hyponatremia noted prior to diuresis. Last sodium was 136.  Obstructive sleep apnea Obstructive sleep apnea managed with CPAP while sleeping.  Depression and anxiety Depression and anxiety managed with Lexapro  and Trazodone .  Recent fever with suspected bronchitis (resolved) Recent fever with suspected bronchitis treated with oral antibiotic cefuroxime .         Dispo: 6 mths APP  Signed, Oneil Parchment, MD

## 2023-11-24 NOTE — Patient Instructions (Signed)
 Medication Instructions:  The current medical regimen is effective;  continue present plan and medications.  *If you need a refill on your cardiac medications before your next appointment, please call your pharmacy*  Follow-Up: At [facility], you and your health needs are our priority.  As part of our continuing mission to provide you with exceptional heart care, our providers are all part of one team.  This team includes your primary Cardiologist (physician) and Advanced Practice Providers or APPs (Physician Assistants and Nurse Practitioners) who all work together to provide you with the care you need, when you need it.  Your next appointment:   4 month(s)  Provider:   One of our Advanced Practice Providers (APPs): Morse Clause, PA-C  Lamarr Satterfield, NP Miriam Shams, NP  Olivia Pavy, PA-C Josefa Beauvais, NP  Leontine Salen, PA-C Orren Fabry, PA-C  Hao Meng, PA-C Ernest Dick, NP  Damien Braver, NP Jon Hails, PA-C  Waddell Donath, PA-C    Dayna Dunn, PA-C  [name] Weaver, PA-C Lum Louis, NP Katlyn West, NP Callie Goodrich, PA-C  Xika Zhao, NP Sheng Haley, PA-C    Kathleen Johnson, PA-C       We recommend signing up for the patient portal called MyChart.  Sign up information is provided on this After Visit Summary.  MyChart is used to connect with patients for Virtual Visits (Telemedicine).  Patients are able to view lab/test results, encounter notes, upcoming appointments, etc.  Non-urgent messages can be sent to your provider as well.   To learn more about what you can do with MyChart, go to [email or web address].

## 2023-11-25 ENCOUNTER — Other Ambulatory Visit: Payer: Self-pay | Admitting: Cardiology

## 2023-11-26 ENCOUNTER — Ambulatory Visit: Admitting: Podiatry

## 2023-11-26 DIAGNOSIS — E1121 Type 2 diabetes mellitus with diabetic nephropathy: Secondary | ICD-10-CM | POA: Diagnosis not present

## 2023-11-26 DIAGNOSIS — B351 Tinea unguium: Secondary | ICD-10-CM

## 2023-11-26 DIAGNOSIS — M79675 Pain in left toe(s): Secondary | ICD-10-CM | POA: Diagnosis not present

## 2023-11-26 DIAGNOSIS — M79674 Pain in right toe(s): Secondary | ICD-10-CM | POA: Diagnosis not present

## 2023-11-26 NOTE — Progress Notes (Signed)
This patient returns to my office for at risk foot care.  This patient requires this care by a professional since this patient will be at risk due to having diabetes.  This patient is unable to cut nails himself since the patient cannot reach his nails.These nails are painful walking and wearing shoes.  This patient presents for at risk foot care today.  General Appearance  Alert, conversant and in no acute stress.  Vascular  Dorsalis pedis and posterior tibial  pulses are weakly  palpable  bilaterally.  Capillary return is within normal limits  bilaterally. Temperature is within normal limits  bilaterally.  Neurologic  Senn-Weinstein monofilament wire test within normal limits  bilaterally. Muscle power within normal limits bilaterally.  Nails Thick disfigured discolored nails with subungual debris  from hallux to fifth toes bilaterally. No evidence of bacterial infection or drainage bilaterally.  Orthopedic  No limitations of motion  feet .  No crepitus or effusions noted.  No bony pathology or digital deformities noted. HAV  B/L.  Skin  normotropic skin with no porokeratosis noted bilaterally.  No signs of infections or ulcers noted.     Onychomycosis  Pain in right toes  Pain in left toes  Consent was obtained for treatment procedures.   Mechanical debridement of nails 1-5  bilaterally performed with a nail nipper.  Filed with dremel without incident.    Return office visit  3 months                   Told patient to return for periodic foot care and evaluation due to potential at risk complications.   Ercelle Winkles DPM  

## 2023-12-23 ENCOUNTER — Other Ambulatory Visit (HOSPITAL_COMMUNITY): Payer: Self-pay | Admitting: Cardiology

## 2024-01-01 DEATH — deceased

## 2024-03-15 ENCOUNTER — Ambulatory Visit (INDEPENDENT_AMBULATORY_CARE_PROVIDER_SITE_OTHER): Payer: Self-pay | Admitting: Podiatry

## 2024-03-15 DIAGNOSIS — Z91199 Patient's noncompliance with other medical treatment and regimen due to unspecified reason: Secondary | ICD-10-CM

## 2024-03-15 NOTE — Progress Notes (Signed)
 1. No-show for appointment

## 2024-06-14 ENCOUNTER — Ambulatory Visit: Admitting: Podiatry
